# Patient Record
Sex: Male | Born: 1985 | Race: Black or African American | Hispanic: No | Marital: Single | Smoking: Current every day smoker
Health system: Southern US, Community
[De-identification: ages and names within clinical notes are randomized; demographics above are authoritative.]

## PROBLEM LIST (undated history)

## (undated) DIAGNOSIS — F209 Schizophrenia, unspecified: Secondary | ICD-10-CM

## (undated) DIAGNOSIS — R000 Tachycardia, unspecified: Secondary | ICD-10-CM

---

## 2014-07-20 DIAGNOSIS — F251 Schizoaffective disorder, depressive type: Secondary | ICD-10-CM | POA: Diagnosis not present

## 2014-07-20 DIAGNOSIS — I499 Cardiac arrhythmia, unspecified: Secondary | ICD-10-CM | POA: Diagnosis not present

## 2014-07-20 DIAGNOSIS — H5010 Unspecified exotropia: Secondary | ICD-10-CM | POA: Diagnosis not present

## 2014-07-20 DIAGNOSIS — F259 Schizoaffective disorder, unspecified: Secondary | ICD-10-CM | POA: Diagnosis not present

## 2014-07-20 DIAGNOSIS — R452 Unhappiness: Secondary | ICD-10-CM | POA: Diagnosis not present

## 2014-07-20 DIAGNOSIS — R000 Tachycardia, unspecified: Secondary | ICD-10-CM | POA: Diagnosis not present

## 2014-07-20 DIAGNOSIS — H40003 Preglaucoma, unspecified, bilateral: Secondary | ICD-10-CM | POA: Diagnosis not present

## 2015-01-16 DIAGNOSIS — H40003 Preglaucoma, unspecified, bilateral: Secondary | ICD-10-CM | POA: Diagnosis not present

## 2015-01-16 DIAGNOSIS — F259 Schizoaffective disorder, unspecified: Secondary | ICD-10-CM | POA: Diagnosis not present

## 2015-01-16 DIAGNOSIS — I499 Cardiac arrhythmia, unspecified: Secondary | ICD-10-CM | POA: Diagnosis not present

## 2015-01-16 DIAGNOSIS — R000 Tachycardia, unspecified: Secondary | ICD-10-CM | POA: Diagnosis not present

## 2015-01-16 DIAGNOSIS — F251 Schizoaffective disorder, depressive type: Secondary | ICD-10-CM | POA: Diagnosis not present

## 2015-01-16 DIAGNOSIS — H5010 Unspecified exotropia: Secondary | ICD-10-CM | POA: Diagnosis not present

## 2015-01-16 DIAGNOSIS — R452 Unhappiness: Secondary | ICD-10-CM | POA: Diagnosis not present

## 2015-01-26 DIAGNOSIS — H5010 Unspecified exotropia: Secondary | ICD-10-CM | POA: Diagnosis not present

## 2015-01-26 DIAGNOSIS — H40003 Preglaucoma, unspecified, bilateral: Secondary | ICD-10-CM | POA: Diagnosis not present

## 2015-05-30 DIAGNOSIS — H5010 Unspecified exotropia: Secondary | ICD-10-CM | POA: Diagnosis not present

## 2016-11-22 ENCOUNTER — Telehealth: Payer: Self-pay

## 2016-11-25 ENCOUNTER — Telehealth: Payer: Self-pay | Admitting: *Deleted

## 2016-11-26 ENCOUNTER — Other Ambulatory Visit: Payer: Self-pay | Admitting: Psychiatry

## 2016-11-27 ENCOUNTER — Encounter: Payer: Self-pay | Admitting: Anesthesiology

## 2016-11-27 ENCOUNTER — Encounter
Admission: RE | Admit: 2016-11-27 | Discharge: 2016-11-27 | Disposition: A | Discharge status: Home / Self Care | Payer: Medicare Other | Source: Ambulatory Visit | Attending: Psychiatry | Admitting: Psychiatry

## 2016-11-27 DIAGNOSIS — F251 Schizoaffective disorder, depressive type: Secondary | ICD-10-CM

## 2016-11-27 DIAGNOSIS — F2089 Other schizophrenia: Secondary | ICD-10-CM | POA: Diagnosis not present

## 2016-11-27 DIAGNOSIS — Z87891 Personal history of nicotine dependence: Secondary | ICD-10-CM | POA: Insufficient documentation

## 2016-11-27 DIAGNOSIS — F339 Major depressive disorder, recurrent, unspecified: Secondary | ICD-10-CM | POA: Diagnosis not present

## 2016-11-27 DIAGNOSIS — F259 Schizoaffective disorder, unspecified: Secondary | ICD-10-CM | POA: Diagnosis not present

## 2016-11-27 HISTORY — DX: Schizophrenia, unspecified: F20.9

## 2016-11-27 HISTORY — DX: Tachycardia, unspecified: R00.0

## 2016-11-27 MED ORDER — MIDAZOLAM HCL 2 MG/2ML IJ SOLN
INTRAMUSCULAR | Status: AC
  Filled 2016-11-27: qty 2

## 2016-11-27 MED ORDER — METHOHEXITAL SODIUM 100 MG/10ML IV SOSY
PREFILLED_SYRINGE | INTRAVENOUS | Status: DC | PRN
  Administered 2016-11-27: 70 mg via INTRAVENOUS

## 2016-11-27 MED ORDER — GLYCOPYRROLATE 0.2 MG/ML IJ SOLN
0.2000 mg | Freq: Once | INTRAMUSCULAR | Status: AC
  Administered 2016-11-27: 0.2 mg via INTRAVENOUS

## 2016-11-27 MED ORDER — SUCCINYLCHOLINE CHLORIDE 200 MG/10ML IV SOSY
PREFILLED_SYRINGE | INTRAVENOUS | Status: DC | PRN
  Administered 2016-11-27: 100 mg via INTRAVENOUS

## 2016-11-27 MED ORDER — SODIUM CHLORIDE 0.9 % IV SOLN
500.0000 mL | Freq: Once | INTRAVENOUS | Status: AC
  Administered 2016-11-27: 10:00:00 via INTRAVENOUS

## 2016-11-27 MED ORDER — GLYCOPYRROLATE 0.2 MG/ML IJ SOLN
INTRAMUSCULAR | Status: AC
  Filled 2016-11-27: qty 1

## 2016-11-27 MED ORDER — MIDAZOLAM HCL 2 MG/2ML IJ SOLN
INTRAMUSCULAR | Status: DC | PRN
  Administered 2016-11-27: 1 mg via INTRAVENOUS

## 2016-11-27 MED ORDER — METHOHEXITAL SODIUM 100 MG/10ML IV SOSY
PREFILLED_SYRINGE | INTRAVENOUS | Status: AC
  Filled 2016-11-27: qty 100

## 2016-11-27 NOTE — Anesthesia Post-op Follow-up Note (Cosign Needed)
Anesthesia QCDR form completed.        

## 2016-11-27 NOTE — Transfer of Care (Signed)
Immediate Anesthesia Transfer of Care Note  Patient: Jonathan Alexander  Procedure(s) Performed: ECT  Patient Location: PACU  Anesthesia Type:General  Level of Consciousness: sedated  Airway & Oxygen Therapy: Patient Spontanous Breathing and Patient connected to face mask oxygen  Post-op Assessment: Report given to RN and Post -op Vital signs reviewed and stable  Post vital signs: Reviewed and stable  Last Vitals:  Vitals:   11/27/16 0908 11/27/16 1147  BP: 125/77 124/81  Pulse: 95 95  Resp: 18 14  Temp: 36.6 C 37 C    Last Pain:  Vitals:   11/27/16 0908  TempSrc: Oral         Complications: No apparent anesthesia complications

## 2016-11-27 NOTE — H&P (Signed)
Jonathan Alexander is an 31 y.o. male.   Chief Complaint: Patient has no specific complaint. HPI: This is a 31 year old man with schizoaffective disorder who is transitioning to maintenance ECT treatment under our care since being discharged from the hospital  Past Medical History:  Diagnosis Date  . Schizophrenia (HCC)   . Tachycardia     History reviewed. No pertinent surgical history.  Family History  Problem Relation Age of Onset  . Family history unknown: Yes   Social History:  reports that he has quit smoking. His smoking use included Cigarettes. He quit after 4.00 years of use. He has never used smokeless tobacco. He reports that he does not drink alcohol or use drugs.  Allergies:  Allergies  Allergen Reactions  . Divalproex Sodium   . Shellfish-Derived Products      (Not in a hospital admission)  No results found for this or any previous visit (from the past 48 hour(s)). No results found.  Review of Systems  Constitutional: Negative.   HENT: Negative.   Eyes: Negative.   Respiratory: Negative.   Cardiovascular: Negative.   Gastrointestinal: Negative.   Musculoskeletal: Negative.   Skin: Negative.   Neurological: Negative.   Psychiatric/Behavioral: Negative for depression, hallucinations, memory loss, substance abuse and suicidal ideas. The patient is not nervous/anxious and does not have insomnia.     Blood pressure 125/77, pulse 95, temperature 97.8 F (36.6 C), temperature source Oral, resp. rate 18, weight 98.4 kg (217 lb), SpO2 100 %. Physical Exam  Nursing note and vitals reviewed. Constitutional: He appears well-developed and well-nourished.  HENT:  Head: Normocephalic and atraumatic.  Eyes: Conjunctivae are normal. Pupils are equal, round, and reactive to light.  Neck: Normal range of motion.  Cardiovascular: Regular rhythm and normal heart sounds.   Respiratory: Effort normal and breath sounds normal. No respiratory distress.  GI: Soft.   Musculoskeletal: Normal range of motion.  Neurological: He is alert.  Skin: Skin is warm and dry.  Psychiatric: His affect is blunt. His speech is delayed. He is slowed and withdrawn. Thought content is not paranoid. Cognition and memory are impaired. He expresses impulsivity. He expresses no homicidal and no suicidal ideation.     Assessment/Plan ECT treatment today using the protocol they had at [facility] with follow-up in 2 weeks  Jonathan Rasmussen, MD 11/27/2016, 11:29 AM

## 2016-11-27 NOTE — Anesthesia Preprocedure Evaluation (Signed)
Anesthesia Evaluation  Patient identified by MRN, date of birth, ID band Patient awake    Reviewed: Allergy & Precautions, H&P , NPO status , Patient's Chart, lab work & pertinent test results, reviewed documented beta blocker date and time   History of Anesthesia Complications Negative for: history of anesthetic complications  Airway Mallampati: II  TM Distance: >3 FB Neck ROM: full    Dental  (+) Poor Dentition, Chipped, Dental Advidsory Given   Pulmonary neg pulmonary ROS, former smoker,           Cardiovascular Exercise Tolerance: Good (-) hypertension(-) angina(-) CAD, (-) Past MI, (-) Cardiac Stents and (-) CABG + dysrhythmias (-) Valvular Problems/Murmurs     Neuro/Psych PSYCHIATRIC DISORDERS (Schizophrenia, depression) negative neurological ROS     GI/Hepatic negative GI ROS, Neg liver ROS,   Endo/Other  negative endocrine ROS  Renal/GU negative Renal ROS  negative genitourinary   Musculoskeletal   Abdominal   Peds  Hematology negative hematology ROS (+)   Anesthesia Other Findings Past Medical History: No date: Tachycardia   Reproductive/Obstetrics negative OB ROS                             Anesthesia Physical Anesthesia Plan  ASA: III  Anesthesia Plan: General   Post-op Pain Management:    Induction:   Airway Management Planned:   Additional Equipment:   Intra-op Plan:   Post-operative Plan:   Informed Consent: I have reviewed the patients History and Physical, chart, labs and discussed the procedure including the risks, benefits and alternatives for the proposed anesthesia with the patient or authorized representative who has indicated his/her understanding and acceptance.   Dental Advisory Given  Plan Discussed with: Anesthesiologist, CRNA and Surgeon  Anesthesia Plan Comments:         Anesthesia Quick Evaluation

## 2016-11-27 NOTE — Anesthesia Procedure Notes (Addendum)
Date/Time: 11/27/2016 11:38 AM Performed by: Lily Kocher Pre-anesthesia Checklist: Patient identified, Emergency Drugs available, Suction available and Patient being monitored Patient Re-evaluated:Patient Re-evaluated prior to inductionOxygen Delivery Method: Circle system utilized Preoxygenation: Pre-oxygenation with 100% oxygen Intubation Type: IV induction Ventilation: Mask ventilation without difficulty and Mask ventilation throughout procedure Airway Equipment and Method: Bite block Placement Confirmation: positive ETCO2 Dental Injury: Teeth and Oropharynx as per pre-operative assessment

## 2016-11-27 NOTE — Anesthesia Postprocedure Evaluation (Signed)
Anesthesia Post Note  Patient: Estate agent  Procedure(s) Performed: * No procedures listed *  Patient location during evaluation: PACU Anesthesia Type: General Level of consciousness: awake and alert Pain management: pain level controlled Vital Signs Assessment: post-procedure vital signs reviewed and stable Respiratory status: spontaneous breathing, nonlabored ventilation, respiratory function stable and patient connected to nasal cannula oxygen Cardiovascular status: blood pressure returned to baseline and stable Postop Assessment: no signs of nausea or vomiting Anesthetic complications: no     Last Vitals:  Vitals:   11/27/16 1237 11/27/16 1247  BP: 125/77 (!) 139/96  Pulse: 98   Resp: 11 (!) 9  Temp:      Last Pain:  Vitals:   11/27/16 1227  TempSrc:   PainSc: Asleep                 Lenard Simmer

## 2016-11-27 NOTE — Procedures (Signed)
ECT SERVICES Physician's Interval Evaluation & Treatment Note  Patient Identification: Jonathan Alexander MRN:  [id number] Date of Evaluation:  11/27/2016 TX #: 1  MADRS: 21  MMSE: 25  P.E. Findings:  Heart and lungs are normal. Vitals unremarkable. Patient does not have any significant physical exam findings  Psychiatric Interval Note:  Mood is stated as okay. Patient is flat slow withdrawn minimal speech.. Denies acute psychotic symptoms  Subjective:  Patient is a 31 y.o. male seen for evaluation for Electroconvulsive Therapy. Patient gets maintenance ECT every other week and we will try to maintain that schedule  Treatment Summary:     Right Unilateral              Bilateral   % Energy : 1.0 ms 100%   Impedance: 1060 ohms  Seizure Energy Index: 17,460 V squared  Postictal Suppression Index: 90%  Seizure Concordance Index: 97%  Medications  Pre Shock: Robinul 0.2 mg Brevital 70 mg succinylcholine 100 mg  Post Shock: Versed 1 mg  Seizure Duration: 36 seconds by EMG, 37 seconds by EEG   Comments: Follow-up 2 weeks   Lungs:    Clear to auscultation                Other:   Heart:      Regular rhythm              irregular rhythm      Previous H&P reviewed, patient examined and there are NO CHANGES                   Previous H&P reviewed, patient examined and there are changes noted.   Jonathan Rasmussen, MD 4/25/201811:31 AM

## 2016-11-27 NOTE — Discharge Instructions (Signed)
1)  The drugs that you have been given will stay in your system until tomorrow so for the       next 24 hours you should not:  A. Drive an automobile  B. Make any legal decisions  C. Drink any alcoholic beverages  2)  You may resume your regular meals upon return home.  3)  A responsible adult must take you home.  Someone should stay with you for a few          hours, then be available by phone for the remainder of the treatment day.  4)  You May experience any of the following symptoms:  Headache, Nausea and a dry mouth (due to the medications you were given),  temporary memory loss and some confusion, or sore muscles (a warm bath  should help this).  If you you experience any of these symptoms let us know on                your return visit.  5)  Report any of the following: any acute discomfort, severe headache, or temperature        greater than 100.5 F.   Also report any unusual redness, swelling, drainage, or pain         at your IV site.    You may report Symptoms to:  ECT PROGRAM- [place] [facility]          Phone: [phone], ECT Department           or Dr. Shary Key office [phone]  6)  Your next ECT Treatment is Day Wednesday  Date Dec 11, 2016  We will call 2 days prior to your scheduled appointment for arrival times.  7)  Nothing to eat or drink after midnight the night before your procedure.  8)  Take Metoprolol    With a sip of water the morning of your procedure.  9)  Other Instructions: Call [phone] to cancel the morning of your procedure due         to illness or emergency.  10) We will call within 72 hours to assess how you are feeling.

## 2016-11-29 ENCOUNTER — Telehealth: Payer: Self-pay | Admitting: *Deleted

## 2016-12-03 ENCOUNTER — Institutional Professional Consult (permissible substitution): Payer: Self-pay | Admitting: Psychiatry

## 2016-12-09 ENCOUNTER — Telehealth: Payer: Self-pay | Admitting: *Deleted

## 2016-12-10 ENCOUNTER — Other Ambulatory Visit: Payer: Self-pay | Admitting: Psychiatry

## 2016-12-11 ENCOUNTER — Encounter: Payer: Self-pay | Admitting: Certified Registered Nurse Anesthetist

## 2016-12-11 ENCOUNTER — Ambulatory Visit
Admission: RE | Admit: 2016-12-11 | Discharge: 2016-12-11 | Disposition: A | Discharge status: Home / Self Care | Payer: Medicare Other | Source: Ambulatory Visit | Attending: Psychiatry | Admitting: Psychiatry

## 2016-12-11 DIAGNOSIS — F251 Schizoaffective disorder, depressive type: Secondary | ICD-10-CM | POA: Diagnosis not present

## 2016-12-11 DIAGNOSIS — F419 Anxiety disorder, unspecified: Secondary | ICD-10-CM | POA: Insufficient documentation

## 2016-12-11 DIAGNOSIS — Z87891 Personal history of nicotine dependence: Secondary | ICD-10-CM | POA: Insufficient documentation

## 2016-12-11 DIAGNOSIS — F209 Schizophrenia, unspecified: Secondary | ICD-10-CM | POA: Diagnosis not present

## 2016-12-11 DIAGNOSIS — Z79899 Other long term (current) drug therapy: Secondary | ICD-10-CM | POA: Diagnosis not present

## 2016-12-11 DIAGNOSIS — F339 Major depressive disorder, recurrent, unspecified: Secondary | ICD-10-CM | POA: Diagnosis not present

## 2016-12-11 DIAGNOSIS — F259 Schizoaffective disorder, unspecified: Secondary | ICD-10-CM | POA: Insufficient documentation

## 2016-12-11 MED ORDER — MIDAZOLAM HCL 2 MG/2ML IJ SOLN
INTRAMUSCULAR | Status: AC
  Filled 2016-12-11: qty 2

## 2016-12-11 MED ORDER — GLYCOPYRROLATE 0.2 MG/ML IJ SOLN
INTRAMUSCULAR | Status: AC
  Administered 2016-12-11: 0.2 mg via INTRAVENOUS
  Filled 2016-12-11: qty 1

## 2016-12-11 MED ORDER — SUCCINYLCHOLINE CHLORIDE 20 MG/ML IJ SOLN
INTRAMUSCULAR | Status: AC
  Filled 2016-12-11: qty 1

## 2016-12-11 MED ORDER — MIDAZOLAM HCL 2 MG/2ML IJ SOLN
INTRAMUSCULAR | Status: DC | PRN
Start: 2016-12-11 — End: 2016-12-11
  Administered 2016-12-11: 1 mg via INTRAVENOUS

## 2016-12-11 MED ORDER — METHOHEXITAL SODIUM 100 MG/10ML IV SOSY
PREFILLED_SYRINGE | INTRAVENOUS | Status: DC | PRN
  Administered 2016-12-11: 70 mg via INTRAVENOUS

## 2016-12-11 MED ORDER — SODIUM CHLORIDE 0.9 % IV SOLN
INTRAVENOUS | Status: DC | PRN
  Administered 2016-12-11: 11:00:00 via INTRAVENOUS

## 2016-12-11 MED ORDER — MIDAZOLAM HCL 2 MG/2ML IJ SOLN: 1.0000 mg | Freq: Once | INTRAMUSCULAR | Status: DC

## 2016-12-11 MED ORDER — SUCCINYLCHOLINE CHLORIDE 20 MG/ML IJ SOLN
INTRAMUSCULAR | Status: DC | PRN
  Administered 2016-12-11: 100 mg via INTRAVENOUS

## 2016-12-11 MED ORDER — SODIUM CHLORIDE 0.9 % IV SOLN
500.0000 mL | Freq: Once | INTRAVENOUS | Status: AC
  Administered 2016-12-11: 10:00:00 via INTRAVENOUS

## 2016-12-11 MED ORDER — GLYCOPYRROLATE 0.2 MG/ML IJ SOLN
0.2000 mg | Freq: Once | INTRAMUSCULAR | Status: AC
Start: 2016-12-11 — End: 2016-12-11
  Administered 2016-12-11: 0.2 mg via INTRAVENOUS

## 2016-12-11 NOTE — Transfer of Care (Signed)
Immediate Anesthesia Transfer of Care Note  Patient: Jonathan Alexander  Procedure(s) Performed: * No procedures listed *  Patient Location: PACU  Anesthesia Type:General  Level of Consciousness: drowsy  Airway & Oxygen Therapy: Patient Spontanous Breathing and Patient connected to face mask oxygen  Post-op Assessment: Report given to RN  Post vital signs: Reviewed and stable  Last Vitals:  Vitals:   12/11/16 0938 12/11/16 1058  BP: (!) 142/91 126/87  Pulse: 89 (!) 103  Resp: 12 (!) 23  Temp: 36.4 C 37.2 C    Last Pain:  Vitals:   12/11/16 1058  PainSc: Asleep         Complications: No apparent anesthesia complications

## 2016-12-11 NOTE — Anesthesia Postprocedure Evaluation (Signed)
Anesthesia Post Note  Patient: Estate agentDemetrio Werst  Procedure(s) Performed: * No procedures listed *  Patient location during evaluation: PACU Anesthesia Type: General Level of consciousness: awake and alert Pain management: pain level controlled Vital Signs Assessment: post-procedure vital signs reviewed and stable Respiratory status: spontaneous breathing, nonlabored ventilation, respiratory function stable and patient connected to nasal cannula oxygen Cardiovascular status: blood pressure returned to baseline and stable Postop Assessment: no signs of nausea or vomiting Anesthetic complications: no     Last Vitals:  Vitals:   12/11/16 1218 12/11/16 1244  BP: 122/90 (!) (P) 109/98  Pulse: 86 (P) 89  Resp: 15 (P) 18  Temp:      Last Pain:  Vitals:   12/11/16 1148  PainSc: Asleep                 Lenard SimmerAndrew Clayton Bosserman

## 2016-12-11 NOTE — H&P (Signed)
Jonathan Alexander is an 31 y.o. male.   Chief Complaint: No specific new complaint. Chronic anxiety. Patient is pretty withdrawn which seems to be baseline HPI: History of schizoaffective disorder stable on combination medication and maintenance ECT  Past Medical History:  Diagnosis Date  . Schizophrenia (HCC)   . Tachycardia     History reviewed. No pertinent surgical history.  Family History  Problem Relation Age of Onset  . Family history unknown: Yes   Social History:  reports that he has quit smoking. His smoking use included Cigarettes. He quit after 4.00 years of use. He has never used smokeless tobacco. He reports that he does not drink alcohol or use drugs.  Allergies:  Allergies  Allergen Reactions  . Divalproex Sodium   . Shellfish-Derived Products      (Not in a hospital admission)  No results found for this or any previous visit (from the past 48 hour(s)). No results found.  Review of Systems  Constitutional: Negative.   HENT: Negative.   Eyes: Negative.   Respiratory: Negative.   Cardiovascular: Negative.   Gastrointestinal: Negative.   Musculoskeletal: Negative.   Skin: Negative.   Neurological: Negative.   Psychiatric/Behavioral: Negative.     Blood pressure (!) 142/91, pulse 89, temperature 97.6 F (36.4 C), resp. rate 12, height 5\' 7"  (1.702 m), weight 100.2 kg (221 lb), SpO2 98 %. Physical Exam  Nursing note and vitals reviewed. Constitutional: He appears well-developed and well-nourished.  HENT:  Head: Normocephalic and atraumatic.  Eyes: Conjunctivae are normal. Pupils are equal, round, and reactive to light.  Neck: Normal range of motion.  Cardiovascular: Normal rate, regular rhythm and normal heart sounds.   Respiratory: Effort normal and breath sounds normal. No respiratory distress.  GI: Soft.  Musculoskeletal: Normal range of motion.  Neurological: He is alert.  Skin: Skin is warm and dry.  Psychiatric: Judgment normal. His affect is  blunt. His speech is delayed. He is slowed and withdrawn. Cognition and memory are normal. He expresses no homicidal and no suicidal ideation.     Assessment/Plan Follow-up 2 weeks per his previous standard protocol.  [name], MD 12/11/2016, 10:38 AM

## 2016-12-11 NOTE — Anesthesia Preprocedure Evaluation (Signed)
Anesthesia Evaluation  Patient identified by MRN, date of birth, ID band Patient awake    Reviewed: Allergy & Precautions, H&P , NPO status , Patient's Chart, lab work & pertinent test results, reviewed documented beta blocker date and time   History of Anesthesia Complications Negative for: history of anesthetic complications  Airway Mallampati: II  TM Distance: >3 FB Neck ROM: full    Dental  (+) Poor Dentition, Chipped, Dental Advidsory Given   Pulmonary neg pulmonary ROS, former smoker,           Cardiovascular Exercise Tolerance: Good (-) hypertension(-) angina(-) CAD, (-) Past MI, (-) Cardiac Stents and (-) CABG + dysrhythmias (-) Valvular Problems/Murmurs     Neuro/Psych PSYCHIATRIC DISORDERS (Schizophrenia, depression) negative neurological ROS     GI/Hepatic negative GI ROS, Neg liver ROS,   Endo/Other  negative endocrine ROS  Renal/GU negative Renal ROS  negative genitourinary   Musculoskeletal   Abdominal   Peds  Hematology negative hematology ROS (+)   Anesthesia Other Findings Past Medical History: No date: Tachycardia   Reproductive/Obstetrics negative OB ROS                             Anesthesia Physical Anesthesia Plan  ASA: III  Anesthesia Plan: General   Post-op Pain Management:    Induction:   Airway Management Planned:   Additional Equipment:   Intra-op Plan:   Post-operative Plan:   Informed Consent: I have reviewed the patients History and Physical, chart, labs and discussed the procedure including the risks, benefits and alternatives for the proposed anesthesia with the patient or authorized representative who has indicated his/her understanding and acceptance.   Dental Advisory Given  Plan Discussed with: Anesthesiologist, CRNA and Surgeon  Anesthesia Plan Comments:         Anesthesia Quick Evaluation  

## 2016-12-11 NOTE — Anesthesia Post-op Follow-up Note (Cosign Needed)
Anesthesia QCDR form completed.        

## 2016-12-11 NOTE — Procedures (Signed)
ECT SERVICES Physician's Interval Evaluation & Treatment Note  Patient Identification: Jonathan Alexander MRN:  [id number] Date of Evaluation:  12/11/2016 TX #: 2  MADRS:   MMSE:   P.E. Findings:  No change to physical exam vital stable heart and lungs normal.  Psychiatric Interval Note:  Patient is stable blunted affect  Subjective:  Patient is a 31 y.o. male seen for evaluation for Electroconvulsive Therapy. No specific complaints  Treatment Summary:   []   Right Unilateral             [x]  Bilateral   % Energy : 1.0 ms 100%   Impedance: 790 ohms  Seizure Energy Index: 15,804 V squared  Postictal Suppression Index: 18%  Seizure Concordance Index: 96%  Medications  Pre Shock: Robinul 0.2 mg Brevital 70 mg succinylcholine 100 mg  Post Shock: Versed 1 mg  Seizure Duration: 32 seconds by EMG 36 seconds by EEG   Comments: Follow-up 2 weeks   Lungs:  [x]   Clear to auscultation               []  Other:   Heart:    [x]   Regular rhythm             []  irregular rhythm    [x]   Previous H&P reviewed, patient examined and there are NO CHANGES                 []   Previous H&P reviewed, patient examined and there are changes noted.   Mordecai RasmussenJohn Alnisa Hasley, MD 5/9/201810:40 AM

## 2016-12-11 NOTE — Discharge Instructions (Signed)
1)  The drugs that you have been given will stay in your system until tomorrow so for the       next 24 hours you should not:  A. Drive an automobile  B. Make any legal decisions  C. Drink any alcoholic beverages  2)  You may resume your regular meals upon return home.  3)  A responsible adult must take you home.  Someone should stay with you for a few          hours, then be available by phone for the remainder of the treatment day.  4)  You May experience any of the following symptoms:  Headache, Nausea and a dry mouth (due to the medications you were given),  temporary memory loss and some confusion, or sore muscles (a warm bath  should help this).  If you you experience any of these symptoms let us know on                your return visit.  5)  Report any of the following: any acute discomfort, severe headache, or temperature        greater than 100.5 F.   Also report any unusual redness, swelling, drainage, or pain         at your IV site.    You may report Symptoms to:  ECT PROGRAM- [place] [facility]          Phone: [phone], ECT Department           or Dr. Shary Keylapac's office [phone]  6)  Your next ECT Treatment is Day Wednesday  Date Dec 25, 2016   We will call 2 days prior to your scheduled appointment for arrival times.  7)  Nothing to eat or drink after midnight the night before your procedure.  8)  Take metoprolol, thiothixene, clozapine, zyprexa, effexor     With a sip of water the morning of your procedure.  9)  Other Instructions: Call [phone] to cancel the morning of your procedure due         to illness or emergency.  10) We will call within 72 hours to assess how you are feeling.

## 2016-12-11 NOTE — Anesthesia Procedure Notes (Signed)
Date/Time: 12/11/2016 10:40 AM Performed by: Marlana SalvageJESSUP, Prathik Aman Pre-anesthesia Checklist: Patient identified, Emergency Drugs available, Suction available, Patient being monitored and Timeout performed Patient Re-evaluated:Patient Re-evaluated prior to inductionOxygen Delivery Method: Circle system utilized Preoxygenation: Pre-oxygenation with 100% oxygen Intubation Type: IV induction Ventilation: Mask ventilation without difficulty and Mask ventilation throughout procedure

## 2016-12-23 ENCOUNTER — Telehealth: Payer: Self-pay | Admitting: *Deleted

## 2016-12-24 ENCOUNTER — Other Ambulatory Visit: Payer: Self-pay | Admitting: Psychiatry

## 2016-12-24 DIAGNOSIS — Z79899 Other long term (current) drug therapy: Secondary | ICD-10-CM | POA: Diagnosis not present

## 2016-12-25 ENCOUNTER — Ambulatory Visit
Admission: RE | Admit: 2016-12-25 | Discharge: 2016-12-25 | Disposition: A | Discharge status: Home / Self Care | Payer: Medicare Other | Source: Ambulatory Visit | Attending: Psychiatry | Admitting: Psychiatry

## 2016-12-25 ENCOUNTER — Encounter: Payer: Self-pay | Admitting: Anesthesiology

## 2016-12-25 DIAGNOSIS — F2089 Other schizophrenia: Secondary | ICD-10-CM | POA: Diagnosis not present

## 2016-12-25 DIAGNOSIS — F209 Schizophrenia, unspecified: Secondary | ICD-10-CM | POA: Diagnosis not present

## 2016-12-25 DIAGNOSIS — F251 Schizoaffective disorder, depressive type: Secondary | ICD-10-CM | POA: Diagnosis not present

## 2016-12-25 DIAGNOSIS — Z87891 Personal history of nicotine dependence: Secondary | ICD-10-CM | POA: Insufficient documentation

## 2016-12-25 DIAGNOSIS — F339 Major depressive disorder, recurrent, unspecified: Secondary | ICD-10-CM | POA: Diagnosis not present

## 2016-12-25 MED ORDER — METHOHEXITAL SODIUM 0.5 G IJ SOLR
INTRAMUSCULAR | Status: AC
  Filled 2016-12-25: qty 500

## 2016-12-25 MED ORDER — METHOHEXITAL SODIUM 100 MG/10ML IV SOSY
PREFILLED_SYRINGE | INTRAVENOUS | Status: DC | PRN
  Administered 2016-12-25: 70 mg via INTRAVENOUS

## 2016-12-25 MED ORDER — MIDAZOLAM HCL 2 MG/2ML IJ SOLN
INTRAMUSCULAR | Status: AC
  Filled 2016-12-25: qty 2

## 2016-12-25 MED ORDER — MIDAZOLAM HCL 2 MG/2ML IJ SOLN: 2.0000 mg | Freq: Once | INTRAMUSCULAR | Status: DC

## 2016-12-25 MED ORDER — MIDAZOLAM HCL 2 MG/2ML IJ SOLN
INTRAMUSCULAR | Status: DC | PRN
  Administered 2016-12-25: 1 mg via INTRAVENOUS

## 2016-12-25 MED ORDER — MIDAZOLAM HCL 2 MG/2ML IJ SOLN: 1.0000 mg | Freq: Once | INTRAMUSCULAR | Status: DC

## 2016-12-25 MED ORDER — SODIUM CHLORIDE 0.9 % IV SOLN
INTRAVENOUS | Status: DC | PRN
  Administered 2016-12-25: 10:00:00 via INTRAVENOUS

## 2016-12-25 MED ORDER — GLYCOPYRROLATE 0.2 MG/ML IJ SOLN
INTRAMUSCULAR | Status: AC
  Administered 2016-12-25: 0.2 mg via INTRAVENOUS
  Filled 2016-12-25: qty 1

## 2016-12-25 MED ORDER — SUCCINYLCHOLINE CHLORIDE 200 MG/10ML IV SOSY
PREFILLED_SYRINGE | INTRAVENOUS | Status: DC | PRN
  Administered 2016-12-25: 100 mg via INTRAVENOUS

## 2016-12-25 MED ORDER — SUCCINYLCHOLINE CHLORIDE 20 MG/ML IJ SOLN
INTRAMUSCULAR | Status: AC
  Filled 2016-12-25: qty 1

## 2016-12-25 MED ORDER — SODIUM CHLORIDE 0.9 % IV SOLN
500.0000 mL | Freq: Once | INTRAVENOUS | Status: AC
  Administered 2016-12-25: 1000 mL via INTRAVENOUS

## 2016-12-25 MED ORDER — GLYCOPYRROLATE 0.2 MG/ML IJ SOLN
0.2000 mg | Freq: Once | INTRAMUSCULAR | Status: AC
  Administered 2016-12-25: 0.2 mg via INTRAVENOUS

## 2016-12-25 NOTE — Discharge Instructions (Signed)
1)  The drugs that you have been given will stay in your system until tomorrow so for the       next 24 hours you should not:  A. Drive an automobile  B. Make any legal decisions  C. Drink any alcoholic beverages  2)  You may resume your regular meals upon return home.  3)  A responsible adult must take you home.  Someone should stay with you for a few          hours, then be available by phone for the remainder of the treatment day.  4)  You May experience any of the following symptoms:  Headache, Nausea and a dry mouth (due to the medications you were given),  temporary memory loss and some confusion, or sore muscles (a warm bath  should help this).  If you you experience any of these symptoms let us know on                your return visit.  5)  Report any of the following: any acute discomfort, severe headache, or temperature        greater than 100.5 F.   Also report any unusual redness, swelling, drainage, or pain         at your IV site.    You may report Symptoms to:  [facility] [place] [facility]          Phone: [phone], ECT Department           or Dr. Shary Keylapac's office [phone]  6)  Your next ECT Treatment is Wednesday June 6   We will call 2 days prior to your scheduled appointment for arrival times.  7)  Nothing to eat or drink after midnight the night before your procedure.  8)  Take  medication   With a sip of water the morning of your procedure.  9)  Other Instructions: Call [phone] to cancel the morning of your procedure due         to illness or emergency.  10) We will call within 72 hours to assess how you are feeling.

## 2016-12-25 NOTE — Procedures (Signed)
ECT SERVICES Physician's Interval Evaluation & Treatment Note  Patient Identification: Jonathan FriendlyDemetrio Alexander MRN:  [id number] Date of Evaluation:  12/25/2016 TX #: 3  MADRS:   MMSE:   P.E. Findings:  Vitals unremarkable heart and lungs normal no change to physical.  Psychiatric Interval Note:  No new complaints affect about the same mood about the same no changes  Subjective:  Patient is a 31 y.o. male seen for evaluation for Electroconvulsive Therapy. No specific complaints  Treatment Summary:   []   Right Unilateral             [x]  Bilateral   % Energy : This 1.0 ms 100%   Impedance: 880 ohms  Seizure Energy Index: 21,352 V squared  Postictal Suppression Index: 83%  Seizure Concordance Index: 91%  Medications  Pre Shock: Robinul 0.2 mg Brevital 70 mg succinylcholine 100 mg  Post Shock: Versed 1 mg  Seizure Duration: 33 seconds EMG, 40 seconds EEG   Comments: Follow-up 2 weeks   Lungs:  [x]   Clear to auscultation               []  Other:   Heart:    [x]   Regular rhythm             []  irregular rhythm    [x]   Previous H&P reviewed, patient examined and there are NO CHANGES                 []   Previous H&P reviewed, patient examined and there are changes noted.   [name], MD 5/23/201810:19 AM

## 2016-12-25 NOTE — H&P (Signed)
Jonathan Alexander is an 31 y.o. male.   Chief Complaint: No specific new complaint HPI: Schizoaffective disorder with good response to maintenance ECT continuing with maintenance treatment  Past Medical History:  Diagnosis Date  . Schizophrenia (HCC)   . Tachycardia     History reviewed. No pertinent surgical history.  Family History  Problem Relation Age of Onset  . Family history unknown: Yes   Social History:  reports that he has quit smoking. His smoking use included Cigarettes. He quit after 4.00 years of use. He has never used smokeless tobacco. He reports that he does not drink alcohol or use drugs.  Allergies:  Allergies  Allergen Reactions  . Divalproex Sodium   . Shellfish-Derived Products      (Not in a hospital admission)  No results found for this or any previous visit (from the past 48 hour(s)). No results found.  Review of Systems  Constitutional: Negative.   HENT: Negative.   Eyes: Negative.   Respiratory: Negative.   Cardiovascular: Negative.   Gastrointestinal: Negative.   Musculoskeletal: Negative.   Skin: Negative.   Neurological: Negative.   Psychiatric/Behavioral: Negative.     Blood pressure (!) 139/94, pulse 89, temperature 98.5 F (36.9 C), temperature source Oral, resp. rate (!) 1, height 5\' 7"  (1.702 m), weight 100.2 kg (221 lb), SpO2 100 %. Physical Exam  Nursing note and vitals reviewed. Constitutional: He appears well-developed and well-nourished.  HENT:  Head: Normocephalic and atraumatic.  Eyes: Conjunctivae are normal. Pupils are equal, round, and reactive to light.  Neck: Normal range of motion.  Cardiovascular: Regular rhythm and normal heart sounds.   Respiratory: Effort normal and breath sounds normal. No respiratory distress.  GI: Soft.  Musculoskeletal: Normal range of motion.  Neurological: He is alert.  Skin: Skin is warm and dry.  Psychiatric: Judgment normal. His affect is blunt. His speech is delayed. He is slowed.  Thought content is not paranoid. Cognition and memory are normal. He expresses no homicidal and no suicidal ideation.     Assessment/Plan Treatment today follow-up 2 weeks as per the usual schedule  Jonathan RasmussenJohn Lyna Laningham, MD 12/25/2016, 10:17 AM

## 2016-12-25 NOTE — Anesthesia Procedure Notes (Signed)
Date/Time: 12/25/2016 10:25 AM Performed by: Lily KocherPERALTA, Devon Pretty Pre-anesthesia Checklist: Patient identified, Emergency Drugs available, Suction available and Patient being monitored Patient Re-evaluated:Patient Re-evaluated prior to inductionOxygen Delivery Method: Circle system utilized Preoxygenation: Pre-oxygenation with 100% oxygen Intubation Type: IV induction Ventilation: Mask ventilation without difficulty and Mask ventilation throughout procedure Airway Equipment and Method: Bite block Placement Confirmation: positive ETCO2 Dental Injury: Teeth and Oropharynx as per pre-operative assessment

## 2016-12-25 NOTE — Anesthesia Post-op Follow-up Note (Cosign Needed)
Anesthesia QCDR form completed.        

## 2016-12-25 NOTE — Progress Notes (Signed)
Still sleepy but wakes when calling name.  Will not answer any questions asked.  Will advance to phase II recovery. Position in Reclining chair.  Report called to Violet BaldyKaren Barrett RN.

## 2016-12-25 NOTE — Anesthesia Preprocedure Evaluation (Signed)
Anesthesia Evaluation  Patient identified by MRN, date of birth, ID band Patient awake    Reviewed: Allergy & Precautions, H&P , NPO status , Patient's Chart, lab work & pertinent test results, reviewed documented beta blocker date and time   History of Anesthesia Complications Negative for: history of anesthetic complications  Airway Mallampati: II  TM Distance: >3 FB Neck ROM: full    Dental  (+) Poor Dentition, Chipped, Dental Advidsory Given   Pulmonary neg pulmonary ROS, former smoker,           Cardiovascular Exercise Tolerance: Good (-) hypertension(-) angina(-) CAD, (-) Past MI, (-) Cardiac Stents and (-) CABG + dysrhythmias (-) Valvular Problems/Murmurs     Neuro/Psych PSYCHIATRIC DISORDERS (Schizophrenia, depression) negative neurological ROS     GI/Hepatic negative GI ROS, Neg liver ROS,   Endo/Other  negative endocrine ROS  Renal/GU negative Renal ROS  negative genitourinary   Musculoskeletal   Abdominal   Peds  Hematology negative hematology ROS (+)   Anesthesia Other Findings Past Medical History: No date: Tachycardia   Reproductive/Obstetrics negative OB ROS                             Anesthesia Physical  Anesthesia Plan  ASA: III  Anesthesia Plan: General   Post-op Pain Management:    Induction:   Airway Management Planned:   Additional Equipment:   Intra-op Plan:   Post-operative Plan:   Informed Consent: I have reviewed the patients History and Physical, chart, labs and discussed the procedure including the risks, benefits and alternatives for the proposed anesthesia with the patient or authorized representative who has indicated his/her understanding and acceptance.   Dental Advisory Given  Plan Discussed with: Anesthesiologist, CRNA and Surgeon  Anesthesia Plan Comments:         Anesthesia Quick Evaluation

## 2016-12-25 NOTE — Transfer of Care (Signed)
Immediate Anesthesia Transfer of Care Note  Patient: Jonathan Alexander  Procedure(s) Performed: ECT  Patient Location: PACU  Anesthesia Type:General  Level of Consciousness: sedated  Airway & Oxygen Therapy: Patient Spontanous Breathing  Post-op Assessment: Report given to RN and Post -op Vital signs reviewed and stable  Post vital signs: Reviewed and stable  Last Vitals:  Vitals:   12/25/16 0825 12/25/16 1030  BP: (!) 139/94 (!) 145/57  Pulse: 89 100  Resp: (!) 1 20  Temp: 36.9 C 36.6 C    Last Pain:  Vitals:   12/25/16 1030  TempSrc:   PainSc: Asleep         Complications: No apparent anesthesia complications

## 2016-12-26 NOTE — Anesthesia Postprocedure Evaluation (Signed)
Anesthesia Post Note  Patient: Jonathan Alexander  Procedure(s) Performed: * No procedures listed *  Patient location during evaluation: PACU Anesthesia Type: General Level of consciousness: awake and alert Pain management: pain level controlled Vital Signs Assessment: post-procedure vital signs reviewed and stable Respiratory status: spontaneous breathing, nonlabored ventilation, respiratory function stable and patient connected to nasal cannula oxygen Cardiovascular status: blood pressure returned to baseline and stable Postop Assessment: no signs of nausea or vomiting Anesthetic complications: no     Last Vitals:  Vitals:   12/25/16 1100 12/25/16 1140  BP: 137/87 137/82  Pulse: 97 98  Resp: 16 18  Temp: 36.5 C 36.6 C    Last Pain:  Vitals:   12/25/16 1140  TempSrc: Oral  PainSc:                  Lenard SimmerAndrew Araiya Tilmon

## 2017-01-06 ENCOUNTER — Telehealth: Payer: Self-pay | Admitting: *Deleted

## 2017-01-07 ENCOUNTER — Other Ambulatory Visit: Payer: Self-pay | Admitting: Psychiatry

## 2017-01-08 ENCOUNTER — Encounter: Payer: Self-pay | Admitting: Anesthesiology

## 2017-01-08 ENCOUNTER — Encounter
Admission: RE | Admit: 2017-01-08 | Discharge: 2017-01-08 | Disposition: A | Discharge status: Home / Self Care | Payer: Medicare Other | Source: Ambulatory Visit | Attending: Psychiatry | Admitting: Psychiatry

## 2017-01-08 DIAGNOSIS — Z87891 Personal history of nicotine dependence: Secondary | ICD-10-CM | POA: Diagnosis not present

## 2017-01-08 DIAGNOSIS — F259 Schizoaffective disorder, unspecified: Secondary | ICD-10-CM | POA: Insufficient documentation

## 2017-01-08 DIAGNOSIS — F251 Schizoaffective disorder, depressive type: Secondary | ICD-10-CM | POA: Diagnosis not present

## 2017-01-08 DIAGNOSIS — F2089 Other schizophrenia: Secondary | ICD-10-CM | POA: Diagnosis not present

## 2017-01-08 MED ORDER — MIDAZOLAM HCL 2 MG/2ML IJ SOLN
INTRAMUSCULAR | Status: AC
  Filled 2017-01-08: qty 2

## 2017-01-08 MED ORDER — SUCCINYLCHOLINE CHLORIDE 200 MG/10ML IV SOSY
PREFILLED_SYRINGE | INTRAVENOUS | Status: DC | PRN
  Administered 2017-01-08: 100 mg via INTRAVENOUS

## 2017-01-08 MED ORDER — MIDAZOLAM HCL 2 MG/2ML IJ SOLN
1.0000 mg | Freq: Once | INTRAMUSCULAR | Status: AC
  Administered 2017-01-08: 1 mg via INTRAVENOUS

## 2017-01-08 MED ORDER — GLYCOPYRROLATE 0.2 MG/ML IJ SOLN
INTRAMUSCULAR | Status: AC
  Administered 2017-01-08: 0.2 mg via INTRAVENOUS
  Filled 2017-01-08: qty 1

## 2017-01-08 MED ORDER — GLYCOPYRROLATE 0.2 MG/ML IJ SOLN
0.2000 mg | Freq: Once | INTRAMUSCULAR | Status: AC
  Administered 2017-01-08: 0.2 mg via INTRAVENOUS

## 2017-01-08 MED ORDER — METHOHEXITAL SODIUM 100 MG/10ML IV SOSY
PREFILLED_SYRINGE | INTRAVENOUS | Status: DC | PRN
  Administered 2017-01-08: 70 mg via INTRAVENOUS

## 2017-01-08 MED ORDER — SODIUM CHLORIDE 0.9 % IV SOLN
INTRAVENOUS | Status: DC | PRN
  Administered 2017-01-08: 10:00:00 via INTRAVENOUS

## 2017-01-08 MED ORDER — SUCCINYLCHOLINE CHLORIDE 20 MG/ML IJ SOLN
INTRAMUSCULAR | Status: AC
  Filled 2017-01-08: qty 1

## 2017-01-08 MED ORDER — SODIUM CHLORIDE 0.9 % IV SOLN
500.0000 mL | Freq: Once | INTRAVENOUS | Status: AC
  Administered 2017-01-08: 09:00:00 via INTRAVENOUS

## 2017-01-08 MED ORDER — METHOHEXITAL SODIUM 0.5 G IJ SOLR
INTRAMUSCULAR | Status: AC
  Filled 2017-01-08: qty 500

## 2017-01-08 NOTE — Discharge Instructions (Signed)
1)  The drugs that you have been given will stay in your system until tomorrow so for the       next 24 hours you should not:  A. Drive an automobile  B. Make any legal decisions  C. Drink any alcoholic beverages  2)  You may resume your regular meals upon return home.  3)  A responsible adult must take you home.  Someone should stay with you for a few          hours, then be available by phone for the remainder of the treatment day.  4)  You May experience any of the following symptoms:  Headache, Nausea and a dry mouth (due to the medications you were given),  temporary memory loss and some confusion, or sore muscles (a warm bath  should help this).  If you you experience any of these symptoms let us know on                your return visit.  5)  Report any of the following: any acute discomfort, severe headache, or temperature        greater than 100.5 F.   Also report any unusual redness, swelling, drainage, or pain         at your IV site.    You may report Symptoms to:  [facility] [place] [facility]          Phone: [phone], ECT Department           or Dr. Shary Keylapac's office [phone]  6)  Your next ECT Treatment is Day Wednesday  Date January 22, 2017 at 8am  We will call 2 days prior to your scheduled appointment for arrival times.  7)  Nothing to eat or drink after midnight the night before your procedure.  8)  Take normal morning meds(metoprolol, clozapine, zyprexa,effexor,thiothixene)    With a sip of water the morning of your procedure.  9)  Other Instructions: Call [phone] to cancel the morning of your procedure due         to illness or emergency.  10) We will call within 72 hours to assess how you are feeling.

## 2017-01-08 NOTE — Anesthesia Preprocedure Evaluation (Signed)
Anesthesia Evaluation  Patient identified by MRN, date of birth, ID band Patient awake    Reviewed: Allergy & Precautions, H&P , NPO status , Patient's Chart, lab work & pertinent test results, reviewed documented beta blocker date and time   Airway Mallampati: II   Neck ROM: full    Dental  (+) Poor Dentition   Pulmonary neg pulmonary ROS, former smoker,    Pulmonary exam normal        Cardiovascular negative cardio ROS Normal cardiovascular exam Rhythm:regular Rate:Normal     Neuro/Psych PSYCHIATRIC DISORDERS Schizophrenia negative neurological ROS  negative psych ROS   GI/Hepatic negative GI ROS, Neg liver ROS,   Endo/Other  negative endocrine ROS  Renal/GU negative Renal ROS  negative genitourinary   Musculoskeletal   Abdominal   Peds  Hematology negative hematology ROS (+)   Anesthesia Other Findings Past Medical History: No date: Schizophrenia (HCC) No date: Tachycardia History reviewed. No pertinent surgical history. BMI    Body Mass Index:  34.61 kg/m     Reproductive/Obstetrics negative OB ROS                             Anesthesia Physical Anesthesia Plan  ASA: III  Anesthesia Plan: General   Post-op Pain Management:    Induction:   PONV Risk Score and Plan:   Airway Management Planned:   Additional Equipment:   Intra-op Plan:   Post-operative Plan:   Informed Consent: I have reviewed the patients History and Physical, chart, labs and discussed the procedure including the risks, benefits and alternatives for the proposed anesthesia with the patient or authorized representative who has indicated his/her understanding and acceptance.   Dental Advisory Given  Plan Discussed with: CRNA  Anesthesia Plan Comments:         Anesthesia Quick Evaluation

## 2017-01-08 NOTE — Transfer of Care (Signed)
Immediate Anesthesia Transfer of Care Note  Patient: Jonathan Alexander  Procedure(s) Performed: ECT  Patient Location: PACU  Anesthesia Type:General  Level of Consciousness: sedated  Airway & Oxygen Therapy: Patient Spontanous Breathing  Post-op Assessment: Report given to RN and Post -op Vital signs reviewed and stable  Post vital signs: Reviewed and stable  Last Vitals:  Vitals:   01/08/17 0824 01/08/17 1028  BP: (!) 142/94 (P) 118/68  Pulse: 84 (!) (P) 101  Resp: 16 (P) 18  Temp: 36.1 C (P) 36.7 C    Last Pain:  Vitals:   01/08/17 0824  TempSrc: Oral         Complications: No apparent anesthesia complications

## 2017-01-08 NOTE — Anesthesia Post-op Follow-up Note (Cosign Needed)
Anesthesia QCDR form completed.        

## 2017-01-08 NOTE — H&P (Signed)
Jonathan Alexander is an 31 y.o. male.   Chief Complaint: No specific new complaint. No physical complaints HPI: History of schizoaffective disorder stable on medicine and regular ECT treatments  Past Medical History:  Diagnosis Date  . Schizophrenia (HCC)   . Tachycardia     History reviewed. No pertinent surgical history.  Family History  Problem Relation Age of Onset  . Family history unknown: Yes   Social History:  reports that he has quit smoking. His smoking use included Cigarettes. He quit after 4.00 years of use. He has never used smokeless tobacco. He reports that he does not drink alcohol or use drugs.  Allergies:  Allergies  Allergen Reactions  . Divalproex Sodium   . Shellfish-Derived Products      (Not in a hospital admission)  No results found for this or any previous visit (from the past 48 hour(s)). No results found.  Review of Systems  Constitutional: Negative.   HENT: Negative.   Eyes: Negative.   Respiratory: Negative.   Cardiovascular: Negative.   Gastrointestinal: Negative.   Musculoskeletal: Negative.   Skin: Negative.   Neurological: Negative.   Psychiatric/Behavioral: Negative.     Blood pressure (!) 142/94, pulse 84, temperature 97 F (36.1 C), temperature source Oral, resp. rate 16, height 5\' 7"  (1.702 m), weight 100.2 kg (221 lb), SpO2 97 %. Physical Exam  Nursing note and vitals reviewed. Constitutional: He appears well-developed and well-nourished.  HENT:  Head: Normocephalic and atraumatic.  Eyes: Conjunctivae are normal. Pupils are equal, round, and reactive to light.  Neck: Normal range of motion.  Cardiovascular: Regular rhythm and normal heart sounds.   Respiratory: Effort normal. No respiratory distress.  GI: Soft.  Musculoskeletal: Normal range of motion.  Neurological: He is alert.  Skin: Skin is warm and dry.  Psychiatric: Judgment normal. His affect is blunt. His speech is delayed. He is slowed. Cognition and memory are  normal. He expresses no homicidal and no suicidal ideation.     Assessment/Plan ECT today continue every 2 week schedule which appears to be well tolerated and helping him stay stable and outside the hospital.  Jonathan RasmussenJohn Nishi Neiswonger, MD 01/08/2017, 10:12 AM

## 2017-01-08 NOTE — Procedures (Signed)
ECT SERVICES Physician's Interval Evaluation & Treatment Note  Patient Identification: Alma FriendlyDemetrio Cretella MRN:  [id number] Date of Evaluation:  01/08/2017 TX #: 4  MADRS: 18  MMSE: 24  P.E. Findings:  Vitals unremarkable heart and lungs normal  Psychiatric Interval Note:  Patient seems a little bit more nervous today although still able to interact. No specific new complaints no concerns about any changes from his group home  Subjective:  Patient is a 31 y.o. male seen for evaluation for Electroconvulsive Therapy. No complaints  Treatment Summary:   []   Right Unilateral             [x]  Bilateral   % Energy : 1.0 ms 100%   Impedance: 1050 ohms  Seizure Energy Index: 13,115 V squared  Postictal Suppression Index: 96%  Seizure Concordance Index: 94%  Medications  Pre Shock: Robinul 0.2 mg Brevital 70 mg succinylcholine 100 mg  Post Shock: Versed 2 mg  Seizure Duration: 35 seconds EMG 41 seconds EEG   Comments: Follow-up 2 weeks usual schedule   Lungs:  [x]   Clear to auscultation               []  Other:   Heart:    [x]   Regular rhythm             []  irregular rhythm    [x]   Previous H&P reviewed, patient examined and there are NO CHANGES                 []   Previous H&P reviewed, patient examined and there are changes noted.   Mordecai RasmussenJohn Shauntavia Brackin, MD 6/6/201810:14 AM

## 2017-01-08 NOTE — Anesthesia Procedure Notes (Signed)
Date/Time: 01/08/2017 10:19 AM Performed by: Lily KocherPERALTA, Kyi Romanello Pre-anesthesia Checklist: Patient identified, Emergency Drugs available, Suction available and Patient being monitored Patient Re-evaluated:Patient Re-evaluated prior to inductionOxygen Delivery Method: Circle system utilized Preoxygenation: Pre-oxygenation with 100% oxygen Intubation Type: IV induction Ventilation: Mask ventilation without difficulty and Mask ventilation throughout procedure Airway Equipment and Method: Bite block Placement Confirmation: positive ETCO2 Dental Injury: Teeth and Oropharynx as per pre-operative assessment

## 2017-01-10 NOTE — Anesthesia Postprocedure Evaluation (Signed)
Anesthesia Post Note  Patient: Estate agentDemetrio Cubillos  Procedure(s) Performed: * No procedures listed *  Patient location during evaluation: PACU Anesthesia Type: General Level of consciousness: awake and alert Pain management: pain level controlled Vital Signs Assessment: post-procedure vital signs reviewed and stable Respiratory status: spontaneous breathing, nonlabored ventilation, respiratory function stable and patient connected to nasal cannula oxygen Cardiovascular status: blood pressure returned to baseline and stable Postop Assessment: no signs of nausea or vomiting Anesthetic complications: no     Last Vitals:  Vitals:   01/08/17 1120 01/08/17 1130  BP: 112/73 111/79  Pulse: 97 96  Resp: 13 16  Temp:      Last Pain:  Vitals:   01/10/17 1025  TempSrc:   PainSc: 0-No pain                 Yevette EdwardsJames G Khyla Mccumbers

## 2017-01-20 ENCOUNTER — Telehealth: Payer: Self-pay | Admitting: *Deleted

## 2017-01-22 ENCOUNTER — Encounter: Payer: Self-pay | Admitting: Anesthesiology

## 2017-01-22 ENCOUNTER — Encounter
Admission: RE | Admit: 2017-01-22 | Discharge: 2017-01-22 | Disposition: A | Discharge status: Home / Self Care | Payer: Medicare Other | Source: Ambulatory Visit | Attending: Psychiatry | Admitting: Psychiatry

## 2017-01-22 ENCOUNTER — Other Ambulatory Visit: Payer: Self-pay | Admitting: Psychiatry

## 2017-01-22 DIAGNOSIS — F251 Schizoaffective disorder, depressive type: Secondary | ICD-10-CM

## 2017-01-22 DIAGNOSIS — Z888 Allergy status to other drugs, medicaments and biological substances status: Secondary | ICD-10-CM | POA: Diagnosis not present

## 2017-01-22 DIAGNOSIS — Z87891 Personal history of nicotine dependence: Secondary | ICD-10-CM | POA: Diagnosis not present

## 2017-01-22 DIAGNOSIS — F319 Bipolar disorder, unspecified: Secondary | ICD-10-CM | POA: Diagnosis not present

## 2017-01-22 DIAGNOSIS — F2089 Other schizophrenia: Secondary | ICD-10-CM | POA: Diagnosis not present

## 2017-01-22 DIAGNOSIS — F209 Schizophrenia, unspecified: Secondary | ICD-10-CM | POA: Diagnosis not present

## 2017-01-22 MED ORDER — GLYCOPYRROLATE 0.2 MG/ML IJ SOLN
0.2000 mg | Freq: Once | INTRAMUSCULAR | Status: AC
  Administered 2017-01-22: 0.2 mg via INTRAVENOUS

## 2017-01-22 MED ORDER — SUCCINYLCHOLINE CHLORIDE 20 MG/ML IJ SOLN
INTRAMUSCULAR | Status: AC
  Filled 2017-01-22: qty 1

## 2017-01-22 MED ORDER — METHOHEXITAL SODIUM 0.5 G IJ SOLR
INTRAMUSCULAR | Status: AC
  Filled 2017-01-22: qty 500

## 2017-01-22 MED ORDER — SUCCINYLCHOLINE CHLORIDE 200 MG/10ML IV SOSY
PREFILLED_SYRINGE | INTRAVENOUS | Status: DC | PRN
  Administered 2017-01-22: 100 mg via INTRAVENOUS

## 2017-01-22 MED ORDER — SODIUM CHLORIDE 0.9 % IV SOLN
INTRAVENOUS | Status: DC | PRN
  Administered 2017-01-22: 10:00:00 via INTRAVENOUS

## 2017-01-22 MED ORDER — METHOHEXITAL SODIUM 100 MG/10ML IV SOSY
PREFILLED_SYRINGE | INTRAVENOUS | Status: DC | PRN
  Administered 2017-01-22: 70 mg via INTRAVENOUS

## 2017-01-22 MED ORDER — MIDAZOLAM HCL 2 MG/2ML IJ SOLN
INTRAMUSCULAR | Status: AC
  Filled 2017-01-22: qty 2

## 2017-01-22 MED ORDER — MIDAZOLAM HCL 2 MG/2ML IJ SOLN
2.0000 mg | Freq: Once | INTRAMUSCULAR | Status: AC
  Administered 2017-01-22: 2 mg via INTRAVENOUS

## 2017-01-22 MED ORDER — GLYCOPYRROLATE 0.2 MG/ML IJ SOLN
INTRAMUSCULAR | Status: AC
  Administered 2017-01-22: 0.2 mg via INTRAVENOUS
  Filled 2017-01-22: qty 1

## 2017-01-22 MED ORDER — SODIUM CHLORIDE 0.9 % IV SOLN
500.0000 mL | Freq: Once | INTRAVENOUS | Status: AC
  Administered 2017-01-22: 1000 mL via INTRAVENOUS

## 2017-01-22 NOTE — Anesthesia Preprocedure Evaluation (Signed)
Anesthesia Evaluation  Patient identified by MRN, date of birth, ID band Patient awake    Reviewed: Allergy & Precautions, NPO status , Patient's Chart, lab work & pertinent test results  Airway Mallampati: II       Dental  (+) Teeth Intact   Pulmonary COPD, former smoker,     + decreased breath sounds      Cardiovascular hypertension, Pt. on home beta blockers  Rhythm:Regular Rate:Normal     Neuro/Psych Schizophrenia    GI/Hepatic negative GI ROS, Neg liver ROS,   Endo/Other  negative endocrine ROS  Renal/GU negative Renal ROS     Musculoskeletal negative musculoskeletal ROS (+)   Abdominal Normal abdominal exam  (+)   Peds negative pediatric ROS (+)  Hematology   Anesthesia Other Findings   Reproductive/Obstetrics                             Anesthesia Physical Anesthesia Plan  ASA: II  Anesthesia Plan: General   Post-op Pain Management:    Induction: Intravenous  PONV Risk Score and Plan: 0  Airway Management Planned: Mask and Natural Airway  Additional Equipment:   Intra-op Plan:   Post-operative Plan:   Informed Consent: I have reviewed the patients History and Physical, chart, labs and discussed the procedure including the risks, benefits and alternatives for the proposed anesthesia with the patient or authorized representative who has indicated his/her understanding and acceptance.     Plan Discussed with: CRNA  Anesthesia Plan Comments:         Anesthesia Quick Evaluation

## 2017-01-22 NOTE — Progress Notes (Signed)
Total iv fluids 500

## 2017-01-22 NOTE — H&P (Signed)
Jonathan Alexander is an 31 y.o. male.   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 Schizophrenia (HCC)   . Tachycardia     History reviewed. No pertinent surgical history.  Family History  Problem Relation Age of Onset  . Family history unknown: Yes   Social History:  reports that he has quit smoking. His smoking use included Cigarettes. He quit after 4.00 years of use. He has never used smokeless tobacco. He reports that he does not drink alcohol or use drugs.  Allergies:  Allergies  Allergen Reactions  . Divalproex Sodium   . Shellfish-Derived Products      (Not in a hospital admission)  No results found for this or any previous visit (from the past 48 hour(s)). No results found.  Review of Systems  Constitutional: Negative.   HENT: Negative.   Eyes: Negative.   Respiratory: Negative.   Cardiovascular: Negative.   Gastrointestinal: Negative.   Musculoskeletal: Negative.   Skin: Negative.   Neurological: Negative.   Psychiatric/Behavioral: Negative for depression, hallucinations, memory loss, substance abuse and suicidal ideas. The patient is not nervous/anxious and does not have insomnia.     Blood pressure (!) 143/83, pulse 94, temperature 97.7 F (36.5 C), temperature source Oral, resp. rate 18, height 5\' 7"  (1.702 m), weight 221 lb (100.2 kg), SpO2 100 %. Physical Exam  Nursing note and vitals reviewed. Constitutional: He appears well-developed and well-nourished.  HENT:  Head: Normocephalic and atraumatic.  Eyes: Conjunctivae are normal. Pupils are equal, round, and reactive to light.  Neck: Normal range of motion.  Cardiovascular: Regular rhythm and normal heart sounds.   Respiratory: Effort normal. No respiratory distress.  GI: Soft.   Musculoskeletal: Normal range of motion.  Neurological: He is alert.  Skin: Skin is warm and dry.  Psychiatric: Judgment normal. His affect is blunt. His speech is delayed. He is slowed. He expresses no suicidal ideation. He exhibits abnormal recent memory.     Assessment/Plan Treatment today. Next scheduled time would fall on the holiday. We are going to negotiate probably having him come on July 6  Jonathan RasmussenJohn Clapacs, MD 01/22/2017, 10:09 AM

## 2017-01-22 NOTE — Transfer of Care (Signed)
Immediate Anesthesia Transfer of Care Note  Patient: Jonathan Alexander  Procedure(s) Performed: ECT  Patient Location: PACU  Anesthesia Type:General  Level of Consciousness: sedated  Airway & Oxygen Therapy: Patient Spontanous Breathing  Post-op Assessment: Report given to RN and Post -op Vital signs reviewed and stable  Post vital signs: Reviewed and stable  Last Vitals:  Vitals:   01/22/17 0827 01/22/17 1029  BP: (!) 143/83 (!) 145/54  Pulse: 94 (!) 102  Resp: 18 18  Temp: 36.5 C 37.6 C    Last Pain:  Vitals:   01/22/17 0827  TempSrc: Oral         Complications: No apparent anesthesia complications

## 2017-01-22 NOTE — Anesthesia Postprocedure Evaluation (Signed)
Anesthesia Post Note  Patient: Jonathan Alexander  Procedure(s) Performed: * No procedures listed *  Patient location during evaluation: PACU Anesthesia Type: General Level of consciousness: awake Pain management: pain level controlled Vital Signs Assessment: post-procedure vital signs reviewed and stable Respiratory status: spontaneous breathing Cardiovascular status: stable Anesthetic complications: no     Last Vitals:  Vitals:   01/22/17 1112 01/22/17 1118  BP:  127/88  Pulse: 99 99  Resp: 11 16  Temp: 36.8 C     Last Pain:  Vitals:   01/22/17 1118  TempSrc: Oral                 VAN STAVEREN,Kalie Cabral

## 2017-01-22 NOTE — Anesthesia Procedure Notes (Signed)
Date/Time: 01/22/2017 10:20 AM Performed by: Lily KocherPERALTA, Jonathan Whobrey Pre-anesthesia Checklist: Patient identified, Emergency Drugs available, Suction available and Patient being monitored Patient Re-evaluated:Patient Re-evaluated prior to inductionOxygen Delivery Method: Circle system utilized Preoxygenation: Pre-oxygenation with 100% oxygen Intubation Type: IV induction Ventilation: Mask ventilation without difficulty and Mask ventilation throughout procedure Airway Equipment and Method: Bite block Placement Confirmation: positive ETCO2 Dental Injury: Teeth and Oropharynx as per pre-operative assessment

## 2017-01-22 NOTE — Discharge Instructions (Signed)
1)  The drugs that you have been given will stay in your system until tomorrow so for the       next 24 hours you should not:  A. Drive an automobile  B. Make any legal decisions  C. Drink any alcoholic beverages  2)  You may resume your regular meals upon return home.  3)  A responsible adult must take you home.  Someone should stay with you for a few          hours, then be available by phone for the remainder of the treatment day.  4)  You May experience any of the following symptoms:  Headache, Nausea and a dry mouth (due to the medications you were given),  temporary memory loss and some confusion, or sore muscles (a warm bath  should help this).  If you you experience any of these symptoms let us know on                your return visit.  5)  Report any of the following: any acute discomfort, severe headache, or temperature        greater than 100.5 F.   Also report any unusual redness, swelling, drainage, or pain         at your IV site.    You may report Symptoms to:  [facility] [place] [facility]          Phone: [phone], ECT Department           or Dr. Shary Keylapac's office [phone]  6)  Your next ECT Treatment is Day Friday Date February 07 2017  We will call 2 days prior to your scheduled appointment for arrival times.  7)  Nothing to eat or drink after midnight the night before your procedure.  8)  Take morning meds   With a sip of water the morning of your procedure.  9)  Other Instructions: Call [phone] to cancel the morning of your procedure due         to illness or emergency.  10) We will call within 72 hours to assess how you are feeling.

## 2017-01-22 NOTE — Procedures (Signed)
ECT SERVICES Physician's Interval Evaluation & Treatment Note  Patient Identification: Jonathan FriendlyDemetrio Alexander MRN:  [id number] Date of Evaluation:  01/22/2017 TX #: 5  MADRS:   MMSE:   P.E. Findings:  No new physical exam findings. Vitals unremarkable. Heart and lungs clear  Psychiatric Interval Note:  Affect blunted. Patient withdrawn. Same presentation as usual.  Subjective:  Patient is a 31 y.o. male seen for evaluation for Electroconvulsive Therapy. No specific complaints from the patient or the group home  Treatment Summary:   []   Right Unilateral             [x]  Bilateral   % Energy : 1.0 ms 100%   Impedance: 1700 ohms  Seizure Energy Index: 14,188 V squared  Postictal Suppression Index: 83%  Seizure Concordance Index: 97%  Medications  Pre Shock: Robinul 0.2 mg Brevital 70 mg succinylcholine 100 mg  Post Shock: Versed 2 mg  Seizure Duration: 33 seconds by EMG 35 seconds by EEG   Comments: Follow-up in 2 weeks although that would strictly fall on the holiday we are probably therefore going to have him come in on Friday, July 6 if possible   Lungs:  [x]   Clear to auscultation               []  Other:   Heart:    [x]   Regular rhythm             []  irregular rhythm    [x]   Previous H&P reviewed, patient examined and there are NO CHANGES                 []   Previous H&P reviewed, patient examined and there are changes noted.   Mordecai RasmussenJohn Lorris Carducci, MD 6/20/201810:12 AM

## 2017-01-22 NOTE — Anesthesia Post-op Follow-up Note (Cosign Needed)
Anesthesia QCDR form completed.        

## 2017-02-03 ENCOUNTER — Telehealth: Payer: Self-pay

## 2017-02-06 ENCOUNTER — Other Ambulatory Visit: Payer: Self-pay | Admitting: Psychiatry

## 2017-02-07 ENCOUNTER — Ambulatory Visit
Admission: RE | Admit: 2017-02-07 | Discharge: 2017-02-07 | Disposition: A | Discharge status: Home / Self Care | Payer: Medicare Other | Source: Ambulatory Visit | Attending: Psychiatry | Admitting: Psychiatry

## 2017-02-07 ENCOUNTER — Encounter: Payer: Self-pay | Admitting: Anesthesiology

## 2017-02-07 DIAGNOSIS — R000 Tachycardia, unspecified: Secondary | ICD-10-CM | POA: Diagnosis not present

## 2017-02-07 DIAGNOSIS — F259 Schizoaffective disorder, unspecified: Secondary | ICD-10-CM | POA: Insufficient documentation

## 2017-02-07 DIAGNOSIS — Z87891 Personal history of nicotine dependence: Secondary | ICD-10-CM | POA: Diagnosis not present

## 2017-02-07 DIAGNOSIS — F250 Schizoaffective disorder, bipolar type: Secondary | ICD-10-CM | POA: Diagnosis not present

## 2017-02-07 DIAGNOSIS — F2089 Other schizophrenia: Secondary | ICD-10-CM | POA: Diagnosis not present

## 2017-02-07 MED ORDER — SODIUM CHLORIDE 0.9 % IV SOLN
INTRAVENOUS | Status: DC | PRN
  Administered 2017-02-07: 10:00:00 via INTRAVENOUS

## 2017-02-07 MED ORDER — SUCCINYLCHOLINE CHLORIDE 200 MG/10ML IV SOSY
PREFILLED_SYRINGE | INTRAVENOUS | Status: DC | PRN
  Administered 2017-02-07: 100 mg via INTRAVENOUS

## 2017-02-07 MED ORDER — GLYCOPYRROLATE 0.2 MG/ML IJ SOLN
0.2000 mg | Freq: Once | INTRAMUSCULAR | Status: AC
  Administered 2017-02-07: 0.2 mg via INTRAVENOUS

## 2017-02-07 MED ORDER — MIDAZOLAM HCL 2 MG/2ML IJ SOLN
2.0000 mg | Freq: Once | INTRAMUSCULAR | Status: AC
  Administered 2017-02-07: 2 mg via INTRAVENOUS

## 2017-02-07 MED ORDER — SUCCINYLCHOLINE CHLORIDE 20 MG/ML IJ SOLN
INTRAMUSCULAR | Status: AC
  Filled 2017-02-07: qty 1

## 2017-02-07 MED ORDER — GLYCOPYRROLATE 0.2 MG/ML IJ SOLN
INTRAMUSCULAR | Status: AC
  Filled 2017-02-07: qty 1

## 2017-02-07 MED ORDER — MIDAZOLAM HCL 2 MG/2ML IJ SOLN
INTRAMUSCULAR | Status: AC
  Filled 2017-02-07: qty 2

## 2017-02-07 MED ORDER — SODIUM CHLORIDE 0.9 % IV SOLN
500.0000 mL | Freq: Once | INTRAVENOUS | Status: AC
  Administered 2017-02-07: 1000 mL via INTRAVENOUS

## 2017-02-07 MED ORDER — METHOHEXITAL SODIUM 0.5 G IJ SOLR
INTRAMUSCULAR | Status: AC
  Filled 2017-02-07: qty 500

## 2017-02-07 MED ORDER — METHOHEXITAL SODIUM 100 MG/10ML IV SOSY
PREFILLED_SYRINGE | INTRAVENOUS | Status: DC | PRN
  Administered 2017-02-07: 70 mg via INTRAVENOUS

## 2017-02-07 NOTE — Progress Notes (Signed)
Caregiver Launa Flightobert Johnson at bedside of patient, Patient will only answer his name, no other questions, Asked his caregiver where he is going.

## 2017-02-07 NOTE — Progress Notes (Signed)
Patient remains very drowsy, slow to awaken.

## 2017-02-07 NOTE — Anesthesia Postprocedure Evaluation (Signed)
Anesthesia Post Note  Patient: Estate agentDemetrio Cuppett  Procedure(s) Performed: * No procedures listed *  Patient location during evaluation: PACU Anesthesia Type: General Level of consciousness: awake and alert Pain management: pain level controlled Vital Signs Assessment: post-procedure vital signs reviewed and stable Respiratory status: spontaneous breathing, nonlabored ventilation, respiratory function stable and patient connected to nasal cannula oxygen Cardiovascular status: blood pressure returned to baseline and stable Postop Assessment: no signs of nausea or vomiting Anesthetic complications: no     Last Vitals:  Vitals:   02/07/17 1125 02/07/17 1133  BP: (!) 142/90 (!) 129/92  Pulse: 95 94  Resp: 11 16  Temp:      Last Pain:  Vitals:   02/07/17 1045  PainSc: Asleep                 Breia Ocampo S

## 2017-02-07 NOTE — Progress Notes (Signed)
Patrice with ECT at bedside, patient does arouse To voice, gave staff his name.

## 2017-02-07 NOTE — Anesthesia Post-op Follow-up Note (Cosign Needed)
Anesthesia QCDR form completed.        

## 2017-02-07 NOTE — Anesthesia Procedure Notes (Signed)
Date/Time: 02/07/2017 10:17 AM Performed by: Lily KocherPERALTA, Terique Kawabata Pre-anesthesia Checklist: Patient identified, Emergency Drugs available, Suction available and Patient being monitored Patient Re-evaluated:Patient Re-evaluated prior to inductionOxygen Delivery Method: Circle system utilized Preoxygenation: Pre-oxygenation with 100% oxygen Intubation Type: IV induction Ventilation: Mask ventilation without difficulty and Mask ventilation throughout procedure Airway Equipment and Method: Bite block Placement Confirmation: positive ETCO2 Dental Injury: Teeth and Oropharynx as per pre-operative assessment

## 2017-02-07 NOTE — Procedures (Signed)
ECT SERVICES Physician's Interval Evaluation & Treatment Note  Patient Identification: Jonathan FriendlyDemetrio Alexander MRN:  [id number] Date of Evaluation:  02/07/2017 TX #: 6  MADRS:   MMSE:   P.E. Findings:  Heart and lungs normal vitals normal  Psychiatric Interval Note:  No change no new complaints no return of major mood swings  Subjective:  Patient is a 31 y.o. male seen for evaluation for Electroconvulsive Therapy. No new complaints physically doing well  Treatment Summary:   []   Right Unilateral             [x]  Bilateral   % Energy : 1.0 ms 100%   Impedance: 640 ohms  Seizure Energy Index: 12,037 V squared  Postictal Suppression Index: 66%  Seizure Concordance Index: 95%  Medications  Pre Shock: Robinul 0.2 mg Brevital 70 mg succinylcholine 100 mg  Post Shock: Versed 2 mg  Seizure Duration: 28 seconds EMG, 28 seconds EEG   Comments: No change to routine schedule. Follow-up 2 weeks   Lungs:  [x]   Clear to auscultation               []  Other:   Heart:    [x]   Regular rhythm             []  irregular rhythm    [x]   Previous H&P reviewed, patient examined and there are NO CHANGES                 []   Previous H&P reviewed, patient examined and there are changes noted.   Mordecai RasmussenJohn Adianna Darwin, MD 7/6/201810:11 AM

## 2017-02-07 NOTE — Transfer of Care (Signed)
Immediate Anesthesia Transfer of Care Note  Patient: Jonathan Alexander  Procedure(s) Performed: ECT  Patient Location: PACU  Anesthesia Type:General  Level of Consciousness: sedated  Airway & Oxygen Therapy: Patient Spontanous Breathing  Post-op Assessment: Report given to RN and Post -op Vital signs reviewed and stable  Post vital signs: Reviewed and stable  Last Vitals:  Vitals:   02/07/17 0929 02/07/17 1028  BP: (!) 134/3 138/71  Pulse: 95 (!) 103  Resp: 16 (!) 0  Temp: 36.6 C 36.8 C    Last Pain: There were no vitals filed for this visit.       Complications: No apparent anesthesia complications

## 2017-02-07 NOTE — Discharge Instructions (Signed)
1)  The drugs that you have been given will stay in your system until tomorrow so for the       next 24 hours you should not:  A. Drive an automobile  B. Make any legal decisions  C. Drink any alcoholic beverages  2)  You may resume your regular meals upon return home.  3)  A responsible adult must take you home.  Someone should stay with you for a few          hours, then be available by phone for the remainder of the treatment day.  4)  You May experience any of the following symptoms:  Headache, Nausea and a dry mouth (due to the medications you were given),  temporary memory loss and some confusion, or sore muscles (a warm bath  should help this).  If you you experience any of these symptoms let us know on                your return visit.  5)  Report any of the following: any acute discomfort, severe headache, or temperature        greater than 100.5 F.   Also report any unusual redness, swelling, drainage, or pain         at your IV site.    You may report Symptoms to:  ECT PROGRAM- [place] [facility]          Phone: [phone], ECT Department           or Dr. Shary Keylapac's office [phone]  6)  Your next ECT Treatment is Day Wednesday  Date July 18th 2018  We will call 2 days prior to your scheduled appointment for arrival times.  7)  Nothing to eat or drink after midnight the night before your procedure.  8)  Take all morning meds   With a sip of water the morning of your procedure.  9)  Other Instructions: Call [phone] to cancel the morning of your procedure due         to illness or emergency.  10) We will call within 72 hours to assess how you are feeling.

## 2017-02-07 NOTE — Anesthesia Preprocedure Evaluation (Signed)
Anesthesia Evaluation  Patient identified by MRN, date of birth, ID band Patient awake    Reviewed: Allergy & Precautions, NPO status , Patient's Chart, lab work & pertinent test results, reviewed documented beta blocker date and time   Airway Mallampati: III  TM Distance: >3 FB     Dental  (+) Chipped   Pulmonary former smoker,           Cardiovascular      Neuro/Psych    GI/Hepatic   Endo/Other    Renal/GU      Musculoskeletal   Abdominal   Peds  Hematology   Anesthesia Other Findings Obese.  Reproductive/Obstetrics                             Anesthesia Physical Anesthesia Plan  ASA: III  Anesthesia Plan: General   Post-op Pain Management:    Induction: Intravenous  PONV Risk Score and Plan:   Airway Management Planned:   Additional Equipment:   Intra-op Plan:   Post-operative Plan:   Informed Consent: I have reviewed the patients History and Physical, chart, labs and discussed the procedure including the risks, benefits and alternatives for the proposed anesthesia with the patient or authorized representative who has indicated his/her understanding and acceptance.     Plan Discussed with: CRNA  Anesthesia Plan Comments:         Anesthesia Quick Evaluation  

## 2017-02-07 NOTE — H&P (Signed)
Jonathan Alexander is an 31 y.o. male.   Chief Complaint: 31 year old man with schizoaffective disorder who receives regular maintenance ECT every 2 weeks. No new complaints. No change to condition HPI: Patient is doing well no new medical or psychiatric problems. Has responded well and maintain stability with current schedule  Past Medical History:  Diagnosis Date  . Schizophrenia (HCC)   . Tachycardia     History reviewed. No pertinent surgical history.  Family History  Problem Relation Age of Onset  . Family history unknown: Yes   Social History:  reports that he has quit smoking. His smoking use included Cigarettes. He quit after 4.00 years of use. He has never used smokeless tobacco. He reports that he does not drink alcohol or use drugs.  Allergies:  Allergies  Allergen Reactions  . Divalproex Sodium   . Shellfish-Derived Products      (Not in a hospital admission)  No results found for this or any previous visit (from the past 48 hour(s)). No results found.  Review of Systems  Constitutional: Negative.   HENT: Negative.   Eyes: Negative.   Respiratory: Negative.   Cardiovascular: Negative.   Gastrointestinal: Negative.   Musculoskeletal: Negative.   Skin: Negative.   Neurological: Negative.   Psychiatric/Behavioral: Negative for depression, hallucinations, memory loss, substance abuse and suicidal ideas. The patient is nervous/anxious. The patient does not have insomnia.     Blood pressure (!) 134/3, pulse 95, temperature 97.8 F (36.6 C), resp. rate 16, height 5\' 7"  (1.702 m), weight 99.3 kg (219 lb), SpO2 100 %. Physical Exam  Nursing note and vitals reviewed. Constitutional: He appears well-developed and well-nourished.  HENT:  Head: Normocephalic and atraumatic.  Eyes: Conjunctivae are normal. Pupils are equal, round, and reactive to light.  Neck: Normal range of motion.  Cardiovascular: Regular rhythm and normal heart sounds.   Respiratory: Effort  normal. No respiratory distress.  GI: Soft.  Musculoskeletal: Normal range of motion.  Neurological: He is alert.  Skin: Skin is warm and dry.  Psychiatric: Judgment normal. His affect is blunt. His speech is delayed. He is slowed and withdrawn. Cognition and memory are impaired. He expresses no homicidal and no suicidal ideation.     Assessment/Plan Treatment today follow-up in 2 weeks no change to overall schedule no change to medicine.  Jonathan RasmussenJohn Regis Hinton, MD 02/07/2017, 10:09 AM

## 2017-02-07 NOTE — Progress Notes (Signed)
Patient taken around to get ready for discharge home. Patient assited up in a recilner.

## 2017-02-10 ENCOUNTER — Telehealth: Payer: Self-pay

## 2017-02-12 ENCOUNTER — Telehealth: Payer: Self-pay | Admitting: *Deleted

## 2017-02-14 ENCOUNTER — Encounter: Payer: Self-pay | Admitting: *Deleted

## 2017-02-14 ENCOUNTER — Telehealth: Payer: Self-pay | Admitting: *Deleted

## 2017-02-17 ENCOUNTER — Telehealth: Payer: Self-pay | Admitting: *Deleted

## 2017-02-18 ENCOUNTER — Other Ambulatory Visit: Payer: Self-pay | Admitting: Psychiatry

## 2017-02-19 ENCOUNTER — Encounter: Payer: Self-pay | Admitting: Anesthesiology

## 2017-02-19 ENCOUNTER — Encounter
Admission: RE | Admit: 2017-02-19 | Discharge: 2017-02-19 | Disposition: A | Discharge status: Home / Self Care | Payer: Medicare Other | Source: Ambulatory Visit | Attending: Psychiatry | Admitting: Psychiatry

## 2017-02-19 DIAGNOSIS — F258 Other schizoaffective disorders: Secondary | ICD-10-CM | POA: Insufficient documentation

## 2017-02-19 DIAGNOSIS — F250 Schizoaffective disorder, bipolar type: Secondary | ICD-10-CM

## 2017-02-19 DIAGNOSIS — Z87891 Personal history of nicotine dependence: Secondary | ICD-10-CM | POA: Diagnosis not present

## 2017-02-19 DIAGNOSIS — F2089 Other schizophrenia: Secondary | ICD-10-CM | POA: Diagnosis not present

## 2017-02-19 MED ORDER — MIDAZOLAM HCL 2 MG/2ML IJ SOLN
2.0000 mg | Freq: Once | INTRAMUSCULAR | Status: AC
  Administered 2017-02-19: 1 mg via INTRAVENOUS

## 2017-02-19 MED ORDER — SUCCINYLCHOLINE CHLORIDE 200 MG/10ML IV SOSY
PREFILLED_SYRINGE | INTRAVENOUS | Status: DC | PRN
  Administered 2017-02-19: 100 mg via INTRAVENOUS

## 2017-02-19 MED ORDER — GLYCOPYRROLATE 0.2 MG/ML IJ SOLN
0.2000 mg | Freq: Once | INTRAMUSCULAR | Status: AC
  Administered 2017-02-19: 0.2 mg via INTRAVENOUS

## 2017-02-19 MED ORDER — METHOHEXITAL SODIUM 100 MG/10ML IV SOSY
PREFILLED_SYRINGE | INTRAVENOUS | Status: DC | PRN
  Administered 2017-02-19: 70 mg via INTRAVENOUS

## 2017-02-19 MED ORDER — SUCCINYLCHOLINE CHLORIDE 20 MG/ML IJ SOLN
INTRAMUSCULAR | Status: AC
  Filled 2017-02-19: qty 1

## 2017-02-19 MED ORDER — MIDAZOLAM HCL 2 MG/2ML IJ SOLN
INTRAMUSCULAR | Status: AC
  Filled 2017-02-19: qty 2

## 2017-02-19 MED ORDER — GLYCOPYRROLATE 0.2 MG/ML IJ SOLN
INTRAMUSCULAR | Status: AC
  Administered 2017-02-19: 0.2 mg via INTRAVENOUS
  Filled 2017-02-19: qty 1

## 2017-02-19 MED ORDER — SODIUM CHLORIDE 0.9 % IV SOLN
500.0000 mL | Freq: Once | INTRAVENOUS | Status: AC
  Administered 2017-02-19: 1000 mL via INTRAVENOUS

## 2017-02-19 MED ORDER — SODIUM CHLORIDE 0.9 % IV SOLN
INTRAVENOUS | Status: DC | PRN
Start: 2017-02-19 — End: 2017-02-19
  Administered 2017-02-19: 09:00:00 via INTRAVENOUS

## 2017-02-19 NOTE — Discharge Instructions (Signed)
1)  The drugs that you have been given will stay in your system until tomorrow so for the       next 24 hours you should not:  A. Drive an automobile  B. Make any legal decisions  C. Drink any alcoholic beverages  2)  You may resume your regular meals upon return home.  3)  A responsible adult must take you home.  Someone should stay with you for a few          hours, then be available by phone for the remainder of the treatment day.  4)  You May experience any of the following symptoms:  Headache, Nausea and a dry mouth (due to the medications you were given),  temporary memory loss and some confusion, or sore muscles (a warm bath  should help this).  If you you experience any of these symptoms let us know on                your return visit.  5)  Report any of the following: any acute discomfort, severe headache, or temperature        greater than 100.5 F.   Also report any unusual redness, swelling, drainage, or pain         at your IV site.    You may report Symptoms to:  ECT [facility]- [place] [facility]          Phone: [phone], ECT Department           or Dr. Shary Keylapac's office [phone]  6)  Your next ECT Treatment is Day  Wednesday    Date  March 05, 2017  We will call 2 days prior to your scheduled appointment for arrival times.  7)  Nothing to eat or drink after midnight the night before your procedure.  8)  Take all morning meds     With a sip of water the morning of your procedure.  9)  Other Instructions: Call [phone] to cancel the morning of your procedure due         to illness or emergency.  10) We will call within 72 hours to assess how you are feeling.

## 2017-02-19 NOTE — Anesthesia Procedure Notes (Signed)
Date/Time: 02/19/2017 10:21 AM Performed by: Lily KocherPERALTA, Jonathan Holtzer Pre-anesthesia Checklist: Patient identified, Emergency Drugs available, Suction available and Patient being monitored Patient Re-evaluated:Patient Re-evaluated prior to induction Oxygen Delivery Method: Circle system utilized Preoxygenation: Pre-oxygenation with 100% oxygen Induction Type: IV induction Ventilation: Mask ventilation without difficulty and Mask ventilation throughout procedure Airway Equipment and Method: Bite block Placement Confirmation: positive ETCO2 Dental Injury: Teeth and Oropharynx as per pre-operative assessment

## 2017-02-19 NOTE — Progress Notes (Signed)
Very sleepy but arousable

## 2017-02-19 NOTE — Anesthesia Postprocedure Evaluation (Signed)
Anesthesia Post Note  Patient: Estate agentDemetrio Coey  Procedure(s) Performed: * No procedures listed *  Patient location during evaluation: PACU Anesthesia Type: General Level of consciousness: awake and alert Pain management: pain level controlled Vital Signs Assessment: post-procedure vital signs reviewed and stable Respiratory status: spontaneous breathing, nonlabored ventilation, respiratory function stable and patient connected to nasal cannula oxygen Cardiovascular status: blood pressure returned to baseline and stable Postop Assessment: no signs of nausea or vomiting Anesthetic complications: no     Last Vitals:  Vitals:   02/19/17 1122 02/19/17 1139  BP: 130/88 124/82  Pulse: (!) 110 (!) 109  Resp: 14 15  Temp: 37 C     Last Pain:  Vitals:   02/19/17 1139  TempSrc:   PainSc: 0-No pain                 Yevette EdwardsJames G Adams

## 2017-02-19 NOTE — H&P (Signed)
Jonathan Alexander is an 31 y.o. male.   Chief Complaint: No new complaint. Chronic withdrawn flat mood HPI: Schizoaffective disorder stable on medication and maintenance ECT  Past Medical History:  Diagnosis Date  . Schizophrenia (HCC)   . Tachycardia     History reviewed. No pertinent surgical history.  Family History  Problem Relation Age of Onset  . Family history unknown: Yes   Social History:  reports that he has quit smoking. His smoking use included Cigarettes. He quit after 4.00 years of use. He has never used smokeless tobacco. He reports that he does not drink alcohol or use drugs.  Allergies:  Allergies  Allergen Reactions  . Divalproex Sodium   . Shellfish-Derived Products      (Not in a hospital admission)  No results found for this or any previous visit (from the past 48 hour(s)). No results found.  Review of Systems  Constitutional: Negative.   HENT: Negative.   Eyes: Negative.   Respiratory: Negative.   Cardiovascular: Negative.   Gastrointestinal: Negative.   Musculoskeletal: Negative.   Skin: Negative.   Neurological: Negative.   Psychiatric/Behavioral: Negative.     Blood pressure (!) 135/95, pulse 98, temperature 97.8 F (36.6 C), temperature source Oral, resp. rate 18, height 5\' 7"  (1.702 m), weight 102.5 kg (226 lb), SpO2 100 %. Physical Exam  Nursing note and vitals reviewed. Constitutional: He appears well-developed and well-nourished.  HENT:  Head: Normocephalic and atraumatic.  Eyes: Pupils are equal, round, and reactive to light. Conjunctivae are normal.  Neck: Normal range of motion.  Cardiovascular: Regular rhythm and normal heart sounds.   Respiratory: Effort normal. No respiratory distress.  GI: Soft.  Musculoskeletal: Normal range of motion.  Neurological: He is alert.  Skin: Skin is warm and dry.  Psychiatric: Judgment normal. His affect is blunt. His speech is delayed. He is slowed and withdrawn. Cognition and memory are  normal. He expresses no suicidal ideation.     Assessment/Plan No change to current treatment plan a follow-up treatment every 2 weeks.  Jonathan RasmussenJohn Skylor Schnapp, MD 02/19/2017, 10:12 AM

## 2017-02-19 NOTE — Progress Notes (Signed)
Dr Pernell Dupreadams called  Heart rate 110  No new orders at present  Knows name and birthday    Leave iv in at present

## 2017-02-19 NOTE — Anesthesia Preprocedure Evaluation (Signed)
Anesthesia Evaluation  Patient identified by MRN, date of birth, ID band Patient awake    Reviewed: Allergy & Precautions, H&P , NPO status , Patient's Chart, lab work & pertinent test results, reviewed documented beta blocker date and time   Airway Mallampati: II   Neck ROM: full    Dental  (+) Teeth Intact   Pulmonary neg pulmonary ROS, former smoker,    Pulmonary exam normal        Cardiovascular negative cardio ROS Normal cardiovascular exam Rhythm:regular Rate:Normal     Neuro/Psych PSYCHIATRIC DISORDERS negative neurological ROS  negative psych ROS   GI/Hepatic negative GI ROS, Neg liver ROS,   Endo/Other  negative endocrine ROS  Renal/GU negative Renal ROS  negative genitourinary   Musculoskeletal   Abdominal   Peds  Hematology negative hematology ROS (+)   Anesthesia Other Findings Past Medical History: No date: Schizophrenia (HCC) No date: Tachycardia History reviewed. No pertinent surgical history. BMI    Body Mass Index:  35.40 kg/m     Reproductive/Obstetrics negative OB ROS                             Anesthesia Physical Anesthesia Plan  ASA: II  Anesthesia Plan: General   Post-op Pain Management:    Induction:   PONV Risk Score and Plan: 2 and Ondansetron and Dexamethasone  Airway Management Planned:   Additional Equipment:   Intra-op Plan:   Post-operative Plan:   Informed Consent: I have reviewed the patients History and Physical, chart, labs and discussed the procedure including the risks, benefits and alternatives for the proposed anesthesia with the patient or authorized representative who has indicated his/her understanding and acceptance.   Dental Advisory Given  Plan Discussed with: CRNA  Anesthesia Plan Comments:         Anesthesia Quick Evaluation

## 2017-02-19 NOTE — Anesthesia Post-op Follow-up Note (Cosign Needed)
Anesthesia QCDR form completed.        

## 2017-02-19 NOTE — Procedures (Signed)
ECT SERVICES Physician's Interval Evaluation & Treatment Note  Patient Identification: Jonathan Alexander MRN:  [id number] Date of Evaluation:  02/19/2017 TX #: 7  MADRS:   MMSE:   P.E. Findings:  No change to physical exam.  Psychiatric Interval Note:  Mood and affect flat and blunted about the same  Subjective:  Patient is a 31 y.o. male seen for evaluation for Electroconvulsive Therapy. No specific new complaint  Treatment Summary:   []   Right Unilateral             [x]  Bilateral   % Energy : 1.0 ms 100%   Impedance: 1030 ohms  Seizure Energy Index: 9703 V squared  Postictal Suppression Index: 69%  Seizure Concordance Index: 97%  Medications  Pre Shock: Robinul 0.2 mg Brevital 70 mg succinylcholine 100 mg  Post Shock: Versed 2 mg  Seizure Duration: EMG 35 seconds EEG 41 seconds   Comments: Continue maintenance ECT every 2 weeks   Lungs:  [x]   Clear to auscultation               []  Other:   Heart:    [x]   Regular rhythm             []  irregular rhythm    [x]   Previous H&P reviewed, patient examined and there are NO CHANGES                 []   Previous H&P reviewed, patient examined and there are changes noted.   Jonathan RasmussenJohn Deyci Gesell, MD 7/18/201810:13 AM

## 2017-02-19 NOTE — Transfer of Care (Addendum)
Immediate Anesthesia Transfer of Care Note  Patient: Jonathan Alexander  Procedure(s) Performed: ECT  Patient Location: PACU  Anesthesia Type:General  Level of Consciousness: sedated  Airway & Oxygen Therapy: Patient Spontanous Breathing and Patient connected to nasal cannula oxygen  Post-op Assessment: Report given to RN and Post -op Vital signs reviewed and stable  Post vital signs: Reviewed and stable  Last Vitals:  Vitals:   02/19/17 1052 02/19/17 1102  BP: 131/85 132/86  Pulse: (!) 110 (!) 111  Resp: 14 14  Temp:      Last Pain:  Vitals:   02/19/17 0856  TempSrc: Oral         Complications: No apparent anesthesia complications

## 2017-03-03 ENCOUNTER — Telehealth: Payer: Self-pay

## 2017-03-04 ENCOUNTER — Other Ambulatory Visit: Payer: Self-pay | Admitting: Psychiatry

## 2017-03-05 ENCOUNTER — Encounter: Payer: Self-pay | Admitting: Anesthesiology

## 2017-03-05 ENCOUNTER — Ambulatory Visit
Admission: RE | Admit: 2017-03-05 | Discharge: 2017-03-05 | Disposition: A | Discharge status: Home / Self Care | Payer: Medicare Other | Source: Ambulatory Visit | Attending: Psychiatry | Admitting: Psychiatry

## 2017-03-05 DIAGNOSIS — Z87891 Personal history of nicotine dependence: Secondary | ICD-10-CM | POA: Diagnosis not present

## 2017-03-05 DIAGNOSIS — F250 Schizoaffective disorder, bipolar type: Secondary | ICD-10-CM | POA: Diagnosis not present

## 2017-03-05 DIAGNOSIS — Z79899 Other long term (current) drug therapy: Secondary | ICD-10-CM | POA: Insufficient documentation

## 2017-03-05 DIAGNOSIS — F2089 Other schizophrenia: Secondary | ICD-10-CM | POA: Diagnosis not present

## 2017-03-05 DIAGNOSIS — F259 Schizoaffective disorder, unspecified: Secondary | ICD-10-CM | POA: Insufficient documentation

## 2017-03-05 MED ORDER — FENTANYL CITRATE (PF) 100 MCG/2ML IJ SOLN: 25.0000 ug | INTRAMUSCULAR | Status: DC | PRN

## 2017-03-05 MED ORDER — SODIUM CHLORIDE 0.9 % IV SOLN
500.0000 mL | Freq: Once | INTRAVENOUS | Status: AC
  Administered 2017-03-05: 1000 mL via INTRAVENOUS

## 2017-03-05 MED ORDER — METHOHEXITAL SODIUM 0.5 G IJ SOLR
INTRAMUSCULAR | Status: AC
  Filled 2017-03-05: qty 500

## 2017-03-05 MED ORDER — GLYCOPYRROLATE 0.2 MG/ML IJ SOLN
0.2000 mg | Freq: Once | INTRAMUSCULAR | Status: AC
  Administered 2017-03-05: 0.2 mg via INTRAVENOUS

## 2017-03-05 MED ORDER — ONDANSETRON HCL 4 MG/2ML IJ SOLN: 4.0000 mg | Freq: Once | INTRAMUSCULAR | Status: DC | PRN

## 2017-03-05 MED ORDER — MIDAZOLAM HCL 5 MG/5ML IJ SOLN
INTRAMUSCULAR | Status: DC | PRN
  Administered 2017-03-05: 1 mg via INTRAVENOUS

## 2017-03-05 MED ORDER — GLYCOPYRROLATE 0.2 MG/ML IJ SOLN
INTRAMUSCULAR | Status: AC
  Filled 2017-03-05: qty 1

## 2017-03-05 MED ORDER — METHOHEXITAL SODIUM 100 MG/10ML IV SOSY
PREFILLED_SYRINGE | INTRAVENOUS | Status: DC | PRN
Start: 2017-03-05 — End: 2017-03-05
  Administered 2017-03-05: 70 mg via INTRAVENOUS

## 2017-03-05 MED ORDER — MIDAZOLAM HCL 2 MG/2ML IJ SOLN: 2.0000 mg | Freq: Once | INTRAMUSCULAR | Status: DC

## 2017-03-05 MED ORDER — SODIUM CHLORIDE 0.9 % IV SOLN
INTRAVENOUS | Status: DC | PRN
  Administered 2017-03-05: 10:00:00 via INTRAVENOUS

## 2017-03-05 MED ORDER — MIDAZOLAM HCL 2 MG/2ML IJ SOLN
INTRAMUSCULAR | Status: AC
  Filled 2017-03-05: qty 2

## 2017-03-05 MED ORDER — SUCCINYLCHOLINE CHLORIDE 20 MG/ML IJ SOLN
INTRAMUSCULAR | Status: AC
  Filled 2017-03-05: qty 1

## 2017-03-05 MED ORDER — SUCCINYLCHOLINE CHLORIDE 20 MG/ML IJ SOLN
INTRAMUSCULAR | Status: DC | PRN
  Administered 2017-03-05: 100 mg via INTRAVENOUS

## 2017-03-05 NOTE — Anesthesia Postprocedure Evaluation (Signed)
Anesthesia Post Note  Patient: [name]  Procedure(s) Performed: * No procedures listed *  Patient location during evaluation: PACU Anesthesia Type: General Level of consciousness: awake and alert Pain management: pain level controlled Vital Signs Assessment: post-procedure vital signs reviewed and stable Respiratory status: spontaneous breathing, nonlabored ventilation, respiratory function stable and patient connected to nasal cannula oxygen Cardiovascular status: blood pressure returned to baseline and stable Postop Assessment: no signs of nausea or vomiting Anesthetic complications: no     Last Vitals:  Vitals:   03/05/17 1056 03/05/17 1109  BP: 124/81 133/86  Pulse: (!) 110 (!) 111  Resp: 14 16  Temp: 37.1 C     Last Pain:  Vitals:   03/05/17 1056  TempSrc:   PainSc: Asleep                 Delany Steury S

## 2017-03-05 NOTE — Discharge Instructions (Signed)
1)  The drugs that you have been given will stay in your system until tomorrow so for the       next 24 hours you should not:  A. Drive an automobile  B. Make any legal decisions  C. Drink any alcoholic beverages  2)  You may resume your regular meals upon return home.  3)  A responsible adult must take you home.  Someone should stay with you for a few          hours, then be available by phone for the remainder of the treatment day.  4)  You May experience any of the following symptoms:  Headache, Nausea and a dry mouth (due to the medications you were given),  temporary memory loss and some confusion, or sore muscles (a warm bath  should help this).  If you you experience any of these symptoms let us know on                your return visit.  5)  Report any of the following: any acute discomfort, severe headache, or temperature        greater than 100.5 F.   Also report any unusual redness, swelling, drainage, or pain         at your IV site.    You may report Symptoms to:  [facility] [place] [facility]          Phone: [phone], ECT Department           or Dr. Shary Keylapac's office [phone]  6)  Your next ECT Treatment is Day Wednesday Date August 22,, 2018 at 8am  We will call 2 days prior to your scheduled appointment for arrival times.  7)  Nothing to eat or drink after midnight the night before your procedure.  8)  Take all morning meds     With a sip of water the morning of your procedure.  9)  Other Instructions: Call [phone] to cancel the morning of your procedure due         to illness or emergency.  10) We will call within 72 hours to assess how you are feeling.

## 2017-03-05 NOTE — Anesthesia Preprocedure Evaluation (Signed)
Anesthesia Evaluation  Patient identified by MRN, date of birth, ID band Patient awake    Reviewed: Allergy & Precautions, NPO status , Patient's Chart, lab work & pertinent test results, reviewed documented beta blocker date and time   Airway Mallampati: III  TM Distance: >3 FB     Dental  (+) Chipped   Pulmonary former smoker,           Cardiovascular      Neuro/Psych    GI/Hepatic   Endo/Other    Renal/GU      Musculoskeletal   Abdominal   Peds  Hematology   Anesthesia Other Findings Obese.  Reproductive/Obstetrics                             Anesthesia Physical Anesthesia Plan  ASA: III  Anesthesia Plan: General   Post-op Pain Management:    Induction: Intravenous  PONV Risk Score and Plan:   Airway Management Planned:   Additional Equipment:   Intra-op Plan:   Post-operative Plan:   Informed Consent: I have reviewed the patients History and Physical, chart, labs and discussed the procedure including the risks, benefits and alternatives for the proposed anesthesia with the patient or authorized representative who has indicated his/her understanding and acceptance.     Plan Discussed with: CRNA  Anesthesia Plan Comments:         Anesthesia Quick Evaluation  

## 2017-03-05 NOTE — Procedures (Signed)
ECT SERVICES Physician's Interval Evaluation & Treatment Note  Patient Identification: Alma FriendlyDemetrio Fry MRN:  [id number] Date of Evaluation:  03/05/2017 TX #: 8  MADRS:   MMSE:   P.E. Findings:  Heart and lungs normal. Vitals unremarkable. No change to physical exam  Psychiatric Interval Note:  Flat tired blunt but no complaints pretty much stable no change  Subjective:  Patient is a 31 y.o. male seen for evaluation for Electroconvulsive Therapy. No complaints  Treatment Summary:   []   Right Unilateral             [x]  Bilateral   % Energy : 1.0 ms 100%   Impedance: 1490 ohms  Seizure Energy Index: No reading  Postictal Suppression Index: No reading  Seizure Concordance Index: 96%  Medications  Pre Shock: Robinul 0.2 mg Brevital 70 mg succinylcholine 100 mg  Post Shock: Versed 2 mg  Seizure Duration: 29 seconds by EMG, 29 seconds by EEG   Comments: Follow-up 3 weeks   Lungs:  [x]   Clear to auscultation               []  Other:   Heart:    [x]   Regular rhythm             []  irregular rhythm    [x]   Previous H&P reviewed, patient examined and there are NO CHANGES                 []   Previous H&P reviewed, patient examined and there are changes noted.   Mordecai RasmussenJohn Destaney Sarkis, MD 8/1/201810:12 AM

## 2017-03-05 NOTE — H&P (Signed)
Jonathan Alexander is an 31 y.o. male.   Chief Complaint: Asian has no specific complaint HPI: Long-standing schizoaffective disorder stabilized on combination of medicine and ECT currently receiving maintenance treatment  Past Medical History:  Diagnosis Date  . Schizophrenia (HCC)   . Tachycardia     History reviewed. No pertinent surgical history.  Family History  Problem Relation Age of Onset  . Family history unknown: Yes   Social History:  reports that he has quit smoking. His smoking use included Cigarettes. He quit after 4.00 years of use. He has never used smokeless tobacco. He reports that he does not drink alcohol or use drugs.  Allergies:  Allergies  Allergen Reactions  . Divalproex Sodium   . Shellfish-Derived Products      (Not in a hospital admission)  No results found for this or any previous visit (from the past 48 hour(s)). No results found.  Review of Systems  Constitutional: Negative.   HENT: Negative.   Eyes: Negative.   Respiratory: Negative.   Cardiovascular: Negative.   Gastrointestinal: Negative.   Musculoskeletal: Negative.   Skin: Negative.   Neurological: Negative.   Psychiatric/Behavioral: Negative.     Blood pressure (!) 139/91, pulse 99, temperature (!) 97.4 F (36.3 C), resp. rate 18, height 5\' 7"  (1.702 m), weight 225 lb (102.1 kg), SpO2 100 %. Physical Exam  Nursing note and vitals reviewed. Constitutional: He appears well-developed and well-nourished.  HENT:  Head: Normocephalic and atraumatic.  Eyes: Pupils are equal, round, and reactive to light. Conjunctivae are normal.  Neck: Normal range of motion.  Cardiovascular: Regular rhythm and normal heart sounds.   Respiratory: Effort normal.  GI: Soft.  Musculoskeletal: Normal range of motion.  Neurological: He is alert.  Skin: Skin is warm and dry.  Psychiatric: Judgment normal. His affect is blunt. His speech is delayed. He is slowed. Thought content is not paranoid. Cognition  and memory are impaired. He expresses no homicidal and no suicidal ideation.     Assessment/Plan Treatment today follow-up in 3 weeks because of scheduling conflicts otherwise anticipate continuing a 2 to three-week maintenance schedule  Mordecai RasmussenJohn Patirica Longshore, MD 03/05/2017, 10:11 AM

## 2017-03-05 NOTE — Transfer of Care (Signed)
Immediate Anesthesia Transfer of Care Note  Patient: Jonathan Alexander  Procedure(s) Performed: ect   Patient Location: PACU  Anesthesia Type:General  Level of Consciousness: awake  Airway & Oxygen Therapy: Patient Spontanous Breathing and Patient connected to face mask oxygen  Post-op Assessment: Report given to RN and Post -op Vital signs reviewed and stable  Post vital signs: Reviewed  Last Vitals:  Vitals:   03/05/17 0950 03/05/17 1026  BP: (!) 139/91 115/73  Pulse: 99 (!) 110  Resp: 18 13  Temp:  36.6 C    Last Pain:  Vitals:   03/05/17 0950  TempSrc: Oral         Complications: No apparent anesthesia complications

## 2017-03-05 NOTE — Progress Notes (Signed)
Patient arouses when spoken too. Will not answer any questions. VSS. Will transfer to phase 2 recovery in recliner

## 2017-03-05 NOTE — Anesthesia Post-op Follow-up Note (Cosign Needed)
Anesthesia QCDR form completed.        

## 2017-03-07 DIAGNOSIS — L272 Dermatitis due to ingested food: Secondary | ICD-10-CM | POA: Diagnosis not present

## 2017-03-07 DIAGNOSIS — R000 Tachycardia, unspecified: Secondary | ICD-10-CM | POA: Diagnosis not present

## 2017-03-07 DIAGNOSIS — F259 Schizoaffective disorder, unspecified: Secondary | ICD-10-CM | POA: Diagnosis not present

## 2017-03-07 DIAGNOSIS — E669 Obesity, unspecified: Secondary | ICD-10-CM | POA: Diagnosis not present

## 2017-03-21 DIAGNOSIS — E669 Obesity, unspecified: Secondary | ICD-10-CM | POA: Diagnosis not present

## 2017-03-24 ENCOUNTER — Telehealth: Payer: Self-pay | Admitting: *Deleted

## 2017-03-25 ENCOUNTER — Other Ambulatory Visit: Payer: Self-pay | Admitting: Psychiatry

## 2017-03-25 DIAGNOSIS — Z79899 Other long term (current) drug therapy: Secondary | ICD-10-CM | POA: Diagnosis not present

## 2017-03-26 ENCOUNTER — Encounter: Payer: Self-pay | Admitting: Anesthesiology

## 2017-03-26 ENCOUNTER — Ambulatory Visit
Admission: RE | Admit: 2017-03-26 | Discharge: 2017-03-26 | Disposition: A | Discharge status: Home / Self Care | Payer: Medicare Other | Source: Ambulatory Visit | Attending: Psychiatry | Admitting: Psychiatry

## 2017-03-26 DIAGNOSIS — Z87891 Personal history of nicotine dependence: Secondary | ICD-10-CM | POA: Diagnosis not present

## 2017-03-26 DIAGNOSIS — F259 Schizoaffective disorder, unspecified: Secondary | ICD-10-CM | POA: Diagnosis not present

## 2017-03-26 DIAGNOSIS — F2089 Other schizophrenia: Secondary | ICD-10-CM | POA: Diagnosis not present

## 2017-03-26 DIAGNOSIS — F250 Schizoaffective disorder, bipolar type: Secondary | ICD-10-CM | POA: Diagnosis not present

## 2017-03-26 MED ORDER — GLYCOPYRROLATE 0.2 MG/ML IJ SOLN
0.2000 mg | Freq: Once | INTRAMUSCULAR | Status: AC
  Administered 2017-03-26: 0.2 mg via INTRAVENOUS

## 2017-03-26 MED ORDER — MIDAZOLAM HCL 2 MG/2ML IJ SOLN
INTRAMUSCULAR | Status: DC | PRN
  Administered 2017-03-26: 1 mg via INTRAVENOUS

## 2017-03-26 MED ORDER — MIDAZOLAM HCL 2 MG/2ML IJ SOLN
INTRAMUSCULAR | Status: AC
  Filled 2017-03-26: qty 2

## 2017-03-26 MED ORDER — SODIUM CHLORIDE 0.9 % IV SOLN
500.0000 mL | Freq: Once | INTRAVENOUS | Status: AC
  Administered 2017-03-26: 1000 mL via INTRAVENOUS

## 2017-03-26 MED ORDER — SUCCINYLCHOLINE CHLORIDE 20 MG/ML IJ SOLN
INTRAMUSCULAR | Status: AC
  Filled 2017-03-26: qty 1

## 2017-03-26 MED ORDER — METHOHEXITAL SODIUM 100 MG/10ML IV SOSY
PREFILLED_SYRINGE | INTRAVENOUS | Status: DC | PRN
  Administered 2017-03-26: 70 mg via INTRAVENOUS

## 2017-03-26 MED ORDER — SUCCINYLCHOLINE CHLORIDE 200 MG/10ML IV SOSY
PREFILLED_SYRINGE | INTRAVENOUS | Status: DC | PRN
  Administered 2017-03-26: 100 mg via INTRAVENOUS

## 2017-03-26 MED ORDER — GLYCOPYRROLATE 0.2 MG/ML IJ SOLN
INTRAMUSCULAR | Status: AC
  Filled 2017-03-26: qty 1

## 2017-03-26 MED ORDER — SODIUM CHLORIDE 0.9 % IV SOLN
INTRAVENOUS | Status: DC | PRN
  Administered 2017-03-26 (×2): via INTRAVENOUS

## 2017-03-26 NOTE — H&P (Signed)
Jonathan Alexander is an 31 y.o. male.   Chief Complaint: Asian has no new complaints today. No physical complaints mood stable. HPI: History of schizoaffective disorder with extended periods of disability currently maintained outside the hospital on a combination medicine and ECT  Past Medical History:  Diagnosis Date  . Schizophrenia (HCC)   . Tachycardia     History reviewed. No pertinent surgical history.  Family History  Problem Relation Age of Onset  . Family history unknown: Yes   Social History:  reports that he has quit smoking. His smoking use included Cigarettes. He quit after 4.00 years of use. He has never used smokeless tobacco. He reports that he does not drink alcohol or use drugs.  Allergies:  Allergies  Allergen Reactions  . Divalproex Sodium   . Shellfish-Derived Products      (Not in a hospital admission)  No results found for this or any previous visit (from the past 48 hour(s)). No results found.  Review of Systems  Constitutional: Negative.   HENT: Negative.   Eyes: Negative.   Respiratory: Negative.   Cardiovascular: Negative.   Gastrointestinal: Negative.   Musculoskeletal: Negative.   Skin: Negative.   Neurological: Negative.   Psychiatric/Behavioral: Negative.     Blood pressure 131/77, pulse 94, temperature (!) 97.1 F (36.2 C), temperature source Oral, resp. rate 19, height 5\' 7"  (1.702 m), weight 101.6 kg (224 lb), SpO2 100 %. Physical Exam  Nursing note and vitals reviewed. Constitutional: He appears well-developed and well-nourished.  HENT:  Head: Normocephalic and atraumatic.  Eyes: Pupils are equal, round, and reactive to light. Conjunctivae are normal.  Neck: Normal range of motion.  Cardiovascular: Regular rhythm and normal heart sounds.   Respiratory: Effort normal and breath sounds normal.  GI: Soft.  Musculoskeletal: Normal range of motion.  Neurological: He is alert.  Skin: Skin is warm and dry.  Psychiatric: Judgment  normal. His affect is blunt. His speech is delayed. He is slowed and withdrawn. Thought content is not paranoid. Cognition and memory are impaired. He expresses no homicidal and no suicidal ideation.     Assessment/Plan No change to treatment plan follow-up on the usual every other week schedule patient has no complaint. Tolerates treatment well.  [name], MD 03/26/2017, 10:13 AM

## 2017-03-26 NOTE — Discharge Instructions (Signed)
1)  The drugs that you have been given will stay in your system until tomorrow so for the       next 24 hours you should not:  A. Drive an automobile  B. Make any legal decisions  C. Drink any alcoholic beverages  2)  You may resume your regular meals upon return home.  3)  A responsible adult must take you home.  Someone should stay with you for a few          hours, then be available by phone for the remainder of the treatment day.  4)  You May experience any of the following symptoms:  Headache, Nausea and a dry mouth (due to the medications you were given),  temporary memory loss and some confusion, or sore muscles (a warm bath  should help this).  If you you experience any of these symptoms let us know on                your return visit.  5)  Report any of the following: any acute discomfort, severe headache, or temperature        greater than 100.5 F.   Also report any unusual redness, swelling, drainage, or pain         at your IV site.    You may report Symptoms to:  ECT PROGRAM- [place] [facility]          Phone: [phone], ECT Department           or Dr. Shary Key office [phone]  6)  Your next ECT Treatment is Day   Wednesday  Date   April 09, 2017  We will call 2 days prior to your scheduled appointment for arrival times.  7)  Nothing to eat or drink after midnight the night before your procedure.  8)  Take morning medications     With a sip of water the morning of your procedure.  9)  Other Instructions: Call [phone] to cancel the morning of your procedure due         to illness or emergency.  10) We will call within 72 hours to assess how you are feeling.

## 2017-03-26 NOTE — Procedures (Signed)
ECT SERVICES Physician's Interval Evaluation & Treatment Note  Patient Identification: Jonathan Alexander MRN:  [id number] Date of Evaluation:  03/26/2017 TX #: 9  MADRS:   MMSE:   P.E. Findings:  No change to physical exam. Vitals normal. Heart and lungs normal.  Psychiatric Interval Note:  Withdrawn and blunted not reporting acute psychosis not agitated appears to be stable  Subjective:  Patient is a 31 y.o. male seen for evaluation for Electroconvulsive Therapy. No new complaints today  Treatment Summary:   []   Right Unilateral             [x]  Bilateral   % Energy : 1.0 ms 100%   Impedance: 1470 ohms  Seizure Energy Index: 13,411 V squared  Postictal Suppression Index: 93%  Seizure Concordance Index: 90%  Medications  Pre Shock: Robinul 0.2 mg Brevital 70 mg succinylcholine 100 mg  Post Shock: Versed 2 mg  Seizure Duration: EMG 31 seconds EEG 33 seconds   Comments: Follow-up 2 weeks   Lungs:  [x]   Clear to auscultation               []  Other:   Heart:    [x]   Regular rhythm             []  irregular rhythm    [x]   Previous H&P reviewed, patient examined and there are NO CHANGES                 []   Previous H&P reviewed, patient examined and there are changes noted.   [name], MD 8/22/201810:55 AM

## 2017-03-26 NOTE — Anesthesia Postprocedure Evaluation (Signed)
Anesthesia Post Note  Patient: Estate agent  Procedure(s) Performed: * No procedures listed *  Patient location during evaluation: PACU Anesthesia Type: General Level of consciousness: awake and alert and oriented Pain management: pain level controlled Vital Signs Assessment: post-procedure vital signs reviewed and stable Respiratory status: spontaneous breathing, nonlabored ventilation and respiratory function stable Cardiovascular status: blood pressure returned to baseline and stable Postop Assessment: no signs of nausea or vomiting Anesthetic complications: no     Last Vitals:  Vitals:   03/26/17 1130 03/26/17 1142  BP: 126/80 115/74  Pulse: 95 95  Resp: 13 16  Temp:    SpO2: 98%     Last Pain:  Vitals:   03/26/17 1142  TempSrc:   PainSc: Asleep                 Kalsey Lull

## 2017-03-26 NOTE — Anesthesia Preprocedure Evaluation (Signed)
Anesthesia Evaluation  Patient identified by MRN, date of birth, ID band Patient awake    Reviewed: Allergy & Precautions, H&P , NPO status , Patient's Chart, lab work & pertinent test results, reviewed documented beta blocker date and time   History of Anesthesia Complications Negative for: history of anesthetic complications  Airway Mallampati: II  TM Distance: >3 FB Neck ROM: Full    Dental  (+) Teeth Intact   Pulmonary neg sleep apnea, neg COPD, former smoker,    Pulmonary exam normal breath sounds clear to auscultation- rhonchi (-) wheezing      Cardiovascular Exercise Tolerance: Good (-) hypertension(-) CAD and (-) Past MI negative cardio ROS Normal cardiovascular exam Rhythm:regular Rate:Normal - Systolic murmurs and - Diastolic murmurs    Neuro/Psych PSYCHIATRIC DISORDERS Schizophrenia negative neurological ROS     GI/Hepatic negative GI ROS, Neg liver ROS,   Endo/Other  negative endocrine ROSneg diabetes  Renal/GU negative Renal ROS  negative genitourinary   Musculoskeletal   Abdominal (+) - obese,   Peds  Hematology negative hematology ROS (+)   Anesthesia Other Findings Past Medical History: No date: Schizophrenia (HCC) No date: Tachycardia History reviewed. No pertinent surgical history. BMI    Body Mass Index:  35.40 kg/m     Reproductive/Obstetrics negative OB ROS                             Anesthesia Physical  Anesthesia Plan  ASA: II  Anesthesia Plan: General   Post-op Pain Management:    Induction: Intravenous  PONV Risk Score and Plan: 1 and Ondansetron  Airway Management Planned: Mask  Additional Equipment:   Intra-op Plan:   Post-operative Plan:   Informed Consent: I have reviewed the patients History and Physical, chart, labs and discussed the procedure including the risks, benefits and alternatives for the proposed anesthesia with the patient  or authorized representative who has indicated his/her understanding and acceptance.   Dental Advisory Given  Plan Discussed with: CRNA and Anesthesiologist  Anesthesia Plan Comments:         Anesthesia Quick Evaluation  

## 2017-03-26 NOTE — Anesthesia Post-op Follow-up Note (Signed)
Anesthesia QCDR form completed.        

## 2017-03-26 NOTE — Anesthesia Procedure Notes (Signed)
Date/Time: 03/26/2017 11:02 AM Performed by: Lily Kocher Pre-anesthesia Checklist: Patient identified, Emergency Drugs available, Suction available and Patient being monitored Patient Re-evaluated:Patient Re-evaluated prior to induction Oxygen Delivery Method: Circle system utilized Preoxygenation: Pre-oxygenation with 100% oxygen Induction Type: IV induction Ventilation: Mask ventilation without difficulty and Mask ventilation throughout procedure Airway Equipment and Method: Bite block Placement Confirmation: positive ETCO2 Dental Injury: Teeth and Oropharynx as per pre-operative assessment

## 2017-03-26 NOTE — Transfer of Care (Signed)
Immediate Anesthesia Transfer of Care Note  Patient: Jonathan Alexander  Procedure(s) Performed: ECT  Patient Location: PACU  Anesthesia Type:General  Level of Consciousness: sedated  Airway & Oxygen Therapy: Patient Spontanous Breathing  Post-op Assessment: Report given to RN and Post -op Vital signs reviewed and stable  Post vital signs: Reviewed and stable  Last Vitals:  Vitals:   03/26/17 0931 03/26/17 1110  BP: 131/77 129/86  Pulse: 94 96  Resp: 19 16  Temp: (!) 36.2 C 36.6 C  SpO2: 100% 97%    Last Pain:  Vitals:   03/26/17 1110  TempSrc:   PainSc: Asleep         Complications: No apparent anesthesia complications

## 2017-03-31 DIAGNOSIS — D649 Anemia, unspecified: Secondary | ICD-10-CM | POA: Diagnosis not present

## 2017-03-31 DIAGNOSIS — L272 Dermatitis due to ingested food: Secondary | ICD-10-CM | POA: Diagnosis not present

## 2017-03-31 DIAGNOSIS — F259 Schizoaffective disorder, unspecified: Secondary | ICD-10-CM | POA: Diagnosis not present

## 2017-03-31 DIAGNOSIS — E669 Obesity, unspecified: Secondary | ICD-10-CM | POA: Diagnosis not present

## 2017-03-31 DIAGNOSIS — R748 Abnormal levels of other serum enzymes: Secondary | ICD-10-CM | POA: Diagnosis not present

## 2017-03-31 DIAGNOSIS — R000 Tachycardia, unspecified: Secondary | ICD-10-CM | POA: Diagnosis not present

## 2017-04-01 ENCOUNTER — Other Ambulatory Visit: Payer: Self-pay | Admitting: Internal Medicine

## 2017-04-01 DIAGNOSIS — R7989 Other specified abnormal findings of blood chemistry: Secondary | ICD-10-CM

## 2017-04-01 DIAGNOSIS — R945 Abnormal results of liver function studies: Secondary | ICD-10-CM

## 2017-04-04 ENCOUNTER — Telehealth: Payer: Self-pay | Admitting: *Deleted

## 2017-04-04 ENCOUNTER — Ambulatory Visit
Admission: RE | Admit: 2017-04-04 | Discharge: 2017-04-04 | Disposition: A | Discharge status: Home / Self Care | Payer: Medicare Other | Source: Ambulatory Visit | Attending: Internal Medicine | Admitting: Internal Medicine

## 2017-04-04 DIAGNOSIS — R945 Abnormal results of liver function studies: Secondary | ICD-10-CM | POA: Diagnosis not present

## 2017-04-04 DIAGNOSIS — R7989 Other specified abnormal findings of blood chemistry: Secondary | ICD-10-CM

## 2017-04-08 ENCOUNTER — Other Ambulatory Visit: Payer: Self-pay | Admitting: Psychiatry

## 2017-04-09 ENCOUNTER — Encounter: Payer: Self-pay | Admitting: Anesthesiology

## 2017-04-09 ENCOUNTER — Encounter
Admission: RE | Admit: 2017-04-09 | Discharge: 2017-04-09 | Disposition: A | Discharge status: Home / Self Care | Payer: Medicare Other | Source: Ambulatory Visit | Attending: Psychiatry | Admitting: Psychiatry

## 2017-04-09 DIAGNOSIS — F259 Schizoaffective disorder, unspecified: Secondary | ICD-10-CM | POA: Diagnosis not present

## 2017-04-09 DIAGNOSIS — F250 Schizoaffective disorder, bipolar type: Secondary | ICD-10-CM | POA: Diagnosis not present

## 2017-04-09 DIAGNOSIS — Z87891 Personal history of nicotine dependence: Secondary | ICD-10-CM | POA: Insufficient documentation

## 2017-04-09 DIAGNOSIS — F2089 Other schizophrenia: Secondary | ICD-10-CM | POA: Diagnosis not present

## 2017-04-09 MED ORDER — MIDAZOLAM HCL 2 MG/2ML IJ SOLN
2.0000 mg | Freq: Once | INTRAMUSCULAR | Status: AC
  Administered 2017-04-09: 1 mg via INTRAVENOUS

## 2017-04-09 MED ORDER — SUCCINYLCHOLINE CHLORIDE 200 MG/10ML IV SOSY
PREFILLED_SYRINGE | INTRAVENOUS | Status: DC | PRN
Start: 2017-04-09 — End: 2017-04-09
  Administered 2017-04-09: 100 mg via INTRAVENOUS

## 2017-04-09 MED ORDER — ONDANSETRON HCL 4 MG/2ML IJ SOLN: 4.0000 mg | Freq: Once | INTRAMUSCULAR | Status: DC | PRN

## 2017-04-09 MED ORDER — GLYCOPYRROLATE 0.2 MG/ML IJ SOLN
0.2000 mg | Freq: Once | INTRAMUSCULAR | Status: AC
  Administered 2017-04-09: 0.2 mg via INTRAVENOUS

## 2017-04-09 MED ORDER — FENTANYL CITRATE (PF) 100 MCG/2ML IJ SOLN: 25.0000 ug | INTRAMUSCULAR | Status: DC | PRN

## 2017-04-09 MED ORDER — SODIUM CHLORIDE 0.9 % IV SOLN
500.0000 mL | Freq: Once | INTRAVENOUS | Status: AC
  Administered 2017-04-09: 10:00:00 via INTRAVENOUS

## 2017-04-09 MED ORDER — MIDAZOLAM HCL 2 MG/2ML IJ SOLN
INTRAMUSCULAR | Status: AC
  Filled 2017-04-09: qty 2

## 2017-04-09 MED ORDER — METHOHEXITAL SODIUM 100 MG/10ML IV SOSY
PREFILLED_SYRINGE | INTRAVENOUS | Status: DC | PRN
  Administered 2017-04-09: 70 mg via INTRAVENOUS

## 2017-04-09 MED ORDER — GLYCOPYRROLATE 0.2 MG/ML IJ SOLN
INTRAMUSCULAR | Status: AC
  Filled 2017-04-09: qty 1

## 2017-04-09 MED ORDER — METHOHEXITAL SODIUM 0.5 G IJ SOLR
INTRAMUSCULAR | Status: AC
  Filled 2017-04-09: qty 500

## 2017-04-09 NOTE — H&P (Signed)
Jonathan Alexander is an 31 y.o. male.   Chief Complaint: Patient has no new complaint. Mood stable. No suicidal thoughts. HPI: History of recurrent schizoaffective disorder.  Past Medical History:  Diagnosis Date  . Schizophrenia (HCC)   . Tachycardia     History reviewed. No pertinent surgical history.  Family History  Problem Relation Age of Onset  . Family history unknown: Yes   Social History:  reports that he has quit smoking. His smoking use included Cigarettes. He quit after 4.00 years of use. He has never used smokeless tobacco. He reports that he does not drink alcohol or use drugs.  Allergies:  Allergies  Allergen Reactions  . Divalproex Sodium   . Shellfish-Derived Products      (Not in a hospital admission)  No results found for this or any previous visit (from the past 48 hour(s)). No results found.  Review of Systems  Constitutional: Negative.   HENT: Negative.   Eyes: Negative.   Respiratory: Negative.   Cardiovascular: Negative.   Gastrointestinal: Negative.   Musculoskeletal: Negative.   Skin: Negative.   Neurological: Negative.   Psychiatric/Behavioral: Negative for depression, hallucinations, memory loss, substance abuse and suicidal ideas. The patient is not nervous/anxious and does not have insomnia.     Blood pressure 123/78, pulse 95, temperature (!) 97.2 F (36.2 C), temperature source Oral, height 5\' 7"  (1.702 m), weight 226 lb (102.5 kg), SpO2 100 %. Physical Exam  Nursing note and vitals reviewed. Constitutional: He appears well-developed and well-nourished.  HENT:  Head: Normocephalic and atraumatic.  Eyes: Pupils are equal, round, and reactive to light. Conjunctivae are normal.  Neck: Normal range of motion.  Cardiovascular: Regular rhythm and normal heart sounds.   Respiratory: Effort normal. No respiratory distress.  GI: Soft.  Musculoskeletal: Normal range of motion.  Neurological: He is alert.  Skin: Skin is warm and dry.   Psychiatric: Judgment and thought content normal. His affect is blunt. His speech is delayed. He is slowed and withdrawn. Cognition and memory are impaired.     Assessment/Plan Treatment today in follow-up in 2 weeks  Mordecai RasmussenJohn Solei Wubben, MD 04/09/2017, 10:16 AM

## 2017-04-09 NOTE — Discharge Instructions (Signed)
1)  The drugs that you have been given will stay in your system until tomorrow so for the       next 24 hours you should not:  A. Drive an automobile  B. Make any legal decisions  C. Drink any alcoholic beverages  2)  You may resume your regular meals upon return home.  3)  A responsible adult must take you home.  Someone should stay with you for a few          hours, then be available by phone for the remainder of the treatment day.  4)  You May experience any of the following symptoms:  Headache, Nausea and a dry mouth (due to the medications you were given),  temporary memory loss and some confusion, or sore muscles (a warm bath  should help this).  If you you experience any of these symptoms let us know on                your return visit.  5)  Report any of the following: any acute discomfort, severe headache, or temperature        greater than 100.5 F.   Also report any unusual redness, swelling, drainage, or pain         at your IV site.    You may report Symptoms to:  [facility] [place] [facility]          Phone: [phone], ECT Department           or Dr. Shary Keylapac's office [phone]  6)  Your next ECT Treatment is Day Wednesday  Date September 19,2018  We will call 2 days prior to your scheduled appointment for arrival times.  7)  Nothing to eat or drink after midnight the night before your procedure.  8)  Take all morning meds  With a sip of water the morning of your procedure.  9)  Other Instructions: Call [phone] to cancel the morning of your procedure due         to illness or emergency.  10) We will call within 72 hours to assess how you are feeling.

## 2017-04-09 NOTE — Anesthesia Procedure Notes (Signed)
Date/Time: 04/09/2017 10:22 AM Performed by: Lily KocherPERALTA, Jonathan Mattox Pre-anesthesia Checklist: Patient identified, Emergency Drugs available, Suction available and Patient being monitored Patient Re-evaluated:Patient Re-evaluated prior to induction Oxygen Delivery Method: Circle system utilized Preoxygenation: Pre-oxygenation with 100% oxygen Induction Type: IV induction Ventilation: Mask ventilation without difficulty and Mask ventilation throughout procedure Airway Equipment and Method: Bite block Placement Confirmation: positive ETCO2 Dental Injury: Teeth and Oropharynx as per pre-operative assessment

## 2017-04-09 NOTE — Transfer of Care (Signed)
Immediate Anesthesia Transfer of Care Note  Patient: Jonathan Alexander  Procedure(s) Performed: ECT  Patient Location: PACU  Anesthesia Type:General  Level of Consciousness: sedated  Airway & Oxygen Therapy: Patient Spontanous Breathing  Post-op Assessment: Report given to RN and Post -op Vital signs reviewed and stable  Post vital signs: Reviewed and stable  Last Vitals:  Vitals:   04/09/17 0938 04/09/17 1030  BP: 123/78 129/77  Pulse: 95 (!) 102  Resp:  16  Temp: (!) 36.2 C 36.7 C  SpO2: 100% 97%    Last Pain:  Vitals:   04/09/17 0938  TempSrc: Oral         Complications: No apparent anesthesia complications

## 2017-04-09 NOTE — Anesthesia Post-op Follow-up Note (Signed)
Anesthesia QCDR form completed.        

## 2017-04-09 NOTE — Procedures (Signed)
ECT SERVICES Physician's Interval Evaluation & Treatment Note  Patient Identification: Alma FriendlyDemetrio Kuchera MRN:  [id number] Date of Evaluation:  04/09/2017 TX #: 9  MADRS:   MMSE:   P.E. Findings:  No change to physical exam. Heart and lungs normal.  Psychiatric Interval Note:  Patient's affect is blunt and he is withdrawn. Denies suicidal thoughts. No behavior problems.  Subjective:  Patient is a 31 y.o. male seen for evaluation for Electroconvulsive Therapy. No specific new complaint  Treatment Summary:   []   Right Unilateral             [x]  Bilateral   % Energy : 1.0 ms 100%   Impedance: 730 ohms  Seizure Energy Index: No reading obtain seizure looked very strong  Postictal Suppression Index: No reading  Seizure Concordance Index: No reading  Medications  Pre Shock: Robinul 0.2 mg Brevital 70 mg succinylcholine 100 mg  Post Shock: Versed 2 mg  Seizure Duration: 31 seconds by EMG 31 seconds by EEG   Comments: Follow-up in 2 weeks September 19   Lungs:  [x]   Clear to auscultation               []  Other:   Heart:    [x]   Regular rhythm             []  irregular rhythm    [x]   Previous H&P reviewed, patient examined and there are NO CHANGES                 []   Previous H&P reviewed, patient examined and there are changes noted.   [name], MD 9/5/201810:18 AM

## 2017-04-09 NOTE — Anesthesia Preprocedure Evaluation (Signed)
Anesthesia Evaluation  Patient identified by MRN, date of birth, ID band Patient awake    Reviewed: Allergy & Precautions, H&P , NPO status , Patient's Chart, lab work & pertinent test results, reviewed documented beta blocker date and time   History of Anesthesia Complications Negative for: history of anesthetic complications  Airway Mallampati: II  TM Distance: >3 FB Neck ROM: Full    Dental  (+) Teeth Intact   Pulmonary neg sleep apnea, neg COPD, former smoker,    Pulmonary exam normal breath sounds clear to auscultation- rhonchi (-) wheezing      Cardiovascular Exercise Tolerance: Good (-) hypertension(-) CAD and (-) Past MI negative cardio ROS Normal cardiovascular exam Rhythm:regular Rate:Normal - Systolic murmurs and - Diastolic murmurs    Neuro/Psych PSYCHIATRIC DISORDERS Schizophrenia negative neurological ROS     GI/Hepatic negative GI ROS, Neg liver ROS,   Endo/Other  negative endocrine ROSneg diabetes  Renal/GU negative Renal ROS  negative genitourinary   Musculoskeletal   Abdominal (+) - obese,   Peds  Hematology negative hematology ROS (+)   Anesthesia Other Findings Past Medical History: No date: Schizophrenia (HCC) No date: Tachycardia History reviewed. No pertinent surgical history. BMI    Body Mass Index:  35.40 kg/m     Reproductive/Obstetrics negative OB ROS                             Anesthesia Physical  Anesthesia Plan  ASA: II  Anesthesia Plan: General   Post-op Pain Management:    Induction: Intravenous  PONV Risk Score and Plan: 1 and Ondansetron  Airway Management Planned: Mask  Additional Equipment:   Intra-op Plan:   Post-operative Plan:   Informed Consent: I have reviewed the patients History and Physical, chart, labs and discussed the procedure including the risks, benefits and alternatives for the proposed anesthesia with the patient  or authorized representative who has indicated his/her understanding and acceptance.   Dental Advisory Given  Plan Discussed with: CRNA and Anesthesiologist  Anesthesia Plan Comments:         Anesthesia Quick Evaluation

## 2017-04-09 NOTE — Anesthesia Postprocedure Evaluation (Signed)
Anesthesia Post Note  Patient: Jonathan Alexander  Procedure(s) Performed: * No procedures listed *  Patient location during evaluation: PACU Anesthesia Type: General Level of consciousness: awake and alert and oriented Pain management: pain level controlled Vital Signs Assessment: post-procedure vital signs reviewed and stable Respiratory status: spontaneous breathing Cardiovascular status: blood pressure returned to baseline Anesthetic complications: no     Last Vitals:  Vitals:   04/09/17 1121 04/09/17 1122  BP:  122/80  Pulse: 93 98  Resp: 12 15  Temp:    SpO2: 100% 100%    Last Pain:  Vitals:   04/09/17 1100  TempSrc:   PainSc: 0-No pain                 Terianna Peggs

## 2017-04-14 DIAGNOSIS — F259 Schizoaffective disorder, unspecified: Secondary | ICD-10-CM | POA: Diagnosis not present

## 2017-04-14 DIAGNOSIS — R000 Tachycardia, unspecified: Secondary | ICD-10-CM | POA: Diagnosis not present

## 2017-04-14 DIAGNOSIS — R748 Abnormal levels of other serum enzymes: Secondary | ICD-10-CM | POA: Diagnosis not present

## 2017-04-14 DIAGNOSIS — E669 Obesity, unspecified: Secondary | ICD-10-CM | POA: Diagnosis not present

## 2017-04-14 DIAGNOSIS — D649 Anemia, unspecified: Secondary | ICD-10-CM | POA: Diagnosis not present

## 2017-04-14 DIAGNOSIS — L272 Dermatitis due to ingested food: Secondary | ICD-10-CM | POA: Diagnosis not present

## 2017-04-21 ENCOUNTER — Telehealth: Payer: Self-pay | Admitting: *Deleted

## 2017-04-22 ENCOUNTER — Other Ambulatory Visit: Payer: Self-pay | Admitting: Psychiatry

## 2017-04-23 ENCOUNTER — Encounter (HOSPITAL_BASED_OUTPATIENT_CLINIC_OR_DEPARTMENT_OTHER)
Admission: RE | Admit: 2017-04-23 | Discharge: 2017-04-23 | Disposition: A | Discharge status: Home / Self Care | Payer: Medicare Other | Source: Ambulatory Visit | Attending: Psychiatry | Admitting: Psychiatry

## 2017-04-23 ENCOUNTER — Encounter: Payer: Self-pay | Admitting: Anesthesiology

## 2017-04-23 DIAGNOSIS — F250 Schizoaffective disorder, bipolar type: Secondary | ICD-10-CM

## 2017-04-23 DIAGNOSIS — Z87891 Personal history of nicotine dependence: Secondary | ICD-10-CM | POA: Diagnosis not present

## 2017-04-23 DIAGNOSIS — F259 Schizoaffective disorder, unspecified: Secondary | ICD-10-CM | POA: Diagnosis not present

## 2017-04-23 DIAGNOSIS — F2089 Other schizophrenia: Secondary | ICD-10-CM | POA: Diagnosis not present

## 2017-04-23 MED ORDER — GLYCOPYRROLATE 0.2 MG/ML IJ SOLN
INTRAMUSCULAR | Status: AC
  Administered 2017-04-23: 0.2 mg via INTRAVENOUS
  Filled 2017-04-23: qty 1

## 2017-04-23 MED ORDER — MIDAZOLAM HCL 2 MG/2ML IJ SOLN
INTRAMUSCULAR | Status: DC | PRN
  Administered 2017-04-23: 1 mg via INTRAVENOUS

## 2017-04-23 MED ORDER — MIDAZOLAM HCL 2 MG/2ML IJ SOLN
INTRAMUSCULAR | Status: AC
  Filled 2017-04-23: qty 2

## 2017-04-23 MED ORDER — SODIUM CHLORIDE 0.9 % IV SOLN
INTRAVENOUS | Status: DC | PRN
  Administered 2017-04-23: 11:00:00 via INTRAVENOUS

## 2017-04-23 MED ORDER — FENTANYL CITRATE (PF) 100 MCG/2ML IJ SOLN: 25.0000 ug | INTRAMUSCULAR | Status: DC | PRN

## 2017-04-23 MED ORDER — SUCCINYLCHOLINE CHLORIDE 20 MG/ML IJ SOLN
INTRAMUSCULAR | Status: AC
  Filled 2017-04-23: qty 1

## 2017-04-23 MED ORDER — SODIUM CHLORIDE 0.9 % IV SOLN
500.0000 mL | Freq: Once | INTRAVENOUS | Status: AC
  Administered 2017-04-23: 500 mL via INTRAVENOUS

## 2017-04-23 MED ORDER — ONDANSETRON HCL 4 MG/2ML IJ SOLN: 4.0000 mg | Freq: Once | INTRAMUSCULAR | Status: DC | PRN

## 2017-04-23 MED ORDER — MIDAZOLAM HCL 2 MG/2ML IJ SOLN: 2.0000 mg | Freq: Once | INTRAMUSCULAR | Status: DC

## 2017-04-23 MED ORDER — GLYCOPYRROLATE 0.2 MG/ML IJ SOLN
0.2000 mg | Freq: Once | INTRAMUSCULAR | Status: AC
  Administered 2017-04-23: 0.2 mg via INTRAVENOUS

## 2017-04-23 MED ORDER — SUCCINYLCHOLINE CHLORIDE 200 MG/10ML IV SOSY
PREFILLED_SYRINGE | INTRAVENOUS | Status: DC | PRN
  Administered 2017-04-23: 100 mg via INTRAVENOUS

## 2017-04-23 MED ORDER — METHOHEXITAL SODIUM 100 MG/10ML IV SOSY
PREFILLED_SYRINGE | INTRAVENOUS | Status: DC | PRN
  Administered 2017-04-23: 70 mg via INTRAVENOUS

## 2017-04-23 NOTE — Discharge Instructions (Signed)
1)  The drugs that you have been given will stay in your system until tomorrow so for the       next 24 hours you should not:  A. Drive an automobile  B. Make any legal decisions  C. Drink any alcoholic beverages  2)  You may resume your regular meals upon return home.  3)  A responsible adult must take you home.  Someone should stay with you for a few          hours, then be available by phone for the remainder of the treatment day.  4)  You May experience any of the following symptoms:  Headache, Nausea and a dry mouth (due to the medications you were given),  temporary memory loss and some confusion, or sore muscles (a warm bath  should help this).  If you you experience any of these symptoms let us know on                your return visit.  5)  Report any of the following: any acute discomfort, severe headache, or temperature        greater than 100.5 F.   Also report any unusual redness, swelling, drainage, or pain         at your IV site.    You may report Symptoms to:  ECT PROGRAM- [place] [facility]          Phone: [phone], ECT Department           or Dr. Shary Key office [phone]  6)  Your next ECT Treatment is Day Wednesday  Date May 14, 2017  We will call 2 days prior to your scheduled appointment for arrival times.  7)  Nothing to eat or drink after midnight the night before your procedure.  8)  Take all morning meds    With a sip of water the morning of your procedure.  9)  Other Instructions: Call [phone] to cancel the morning of your procedure due         to illness or emergency.  10) We will call within 72 hours to assess how you are feeling.

## 2017-04-23 NOTE — H&P (Signed)
Jonathan Alexander is an 31 y.o. male.   Chief Complaint: Patient has no complaint. HPI: History of schizoaffective disorder with recurrent severe episodes that responded to ECT under controlled currently with medication and maintenance ECT treatment  Past Medical History:  Diagnosis Date  . Schizophrenia (HCC)   . Tachycardia     History reviewed. No pertinent surgical history.  Family History  Problem Relation Age of Onset  . Family history unknown: Yes   Social History:  reports that he has quit smoking. His smoking use included Cigarettes. He quit after 4.00 years of use. He has never used smokeless tobacco. He reports that he does not drink alcohol or use drugs.  Allergies:  Allergies  Allergen Reactions  . Divalproex Sodium   . Shellfish-Derived Products      (Not in a hospital admission)  No results found for this or any previous visit (from the past 48 hour(s)). No results found.  Review of Systems  Constitutional: Negative.   HENT: Negative.   Eyes: Negative.   Respiratory: Negative.   Cardiovascular: Negative.   Gastrointestinal: Negative.   Musculoskeletal: Negative.   Skin: Negative.   Neurological: Negative.   Psychiatric/Behavioral: Negative for depression, hallucinations, memory loss, substance abuse and suicidal ideas. The patient is not nervous/anxious and does not have insomnia.     There were no vitals taken for this visit. Physical Exam  Nursing note and vitals reviewed. Constitutional: He appears well-developed and well-nourished.  HENT:  Head: Normocephalic and atraumatic.  Eyes: Pupils are equal, round, and reactive to light. Conjunctivae are normal.  Neck: Normal range of motion.  Cardiovascular: Regular rhythm and normal heart sounds.   Respiratory: Effort normal. No respiratory distress.  GI: Soft.  Musculoskeletal: Normal range of motion.  Neurological: He is alert.  Skin: Skin is warm and dry.  Psychiatric: Judgment normal. His  affect is blunt. His speech is delayed. He is slowed. Cognition and memory are impaired. He expresses no homicidal and no suicidal ideation.     Assessment/Plan Patient will be seen again in 2 weeks for follow-up ECT  [name], MD 04/23/2017, 11:55 AM

## 2017-04-23 NOTE — Anesthesia Preprocedure Evaluation (Signed)
Anesthesia Evaluation  Patient identified by MRN, date of birth, ID band Patient awake    Reviewed: Allergy & Precautions, H&P , NPO status , Patient's Chart, lab work & pertinent test results, reviewed documented beta blocker date and time   History of Anesthesia Complications Negative for: history of anesthetic complications  Airway Mallampati: II  TM Distance: >3 FB Neck ROM: Full    Dental  (+) Teeth Intact   Pulmonary neg sleep apnea, neg COPD, former smoker,    Pulmonary exam normal breath sounds clear to auscultation- rhonchi (-) wheezing      Cardiovascular Exercise Tolerance: Good (-) hypertension(-) CAD and (-) Past MI negative cardio ROS Normal cardiovascular exam Rhythm:regular Rate:Normal - Systolic murmurs and - Diastolic murmurs    Neuro/Psych PSYCHIATRIC DISORDERS Schizophrenia negative neurological ROS     GI/Hepatic negative GI ROS, Neg liver ROS,   Endo/Other  negative endocrine ROSneg diabetes  Renal/GU negative Renal ROS  negative genitourinary   Musculoskeletal   Abdominal (+) - obese,   Peds  Hematology negative hematology ROS (+)   Anesthesia Other Findings Past Medical History: No date: Schizophrenia (HCC) No date: Tachycardia History reviewed. No pertinent surgical history. BMI    Body Mass Index:  35.40 kg/m     Reproductive/Obstetrics negative OB ROS                             Anesthesia Physical  Anesthesia Plan  ASA: II  Anesthesia Plan: General   Post-op Pain Management:    Induction: Intravenous  PONV Risk Score and Plan: 1 and Ondansetron  Airway Management Planned: Mask  Additional Equipment:   Intra-op Plan:   Post-operative Plan:   Informed Consent: I have reviewed the patients History and Physical, chart, labs and discussed the procedure including the risks, benefits and alternatives for the proposed anesthesia with the patient  or authorized representative who has indicated his/her understanding and acceptance.   Dental Advisory Given  Plan Discussed with: CRNA and Anesthesiologist  Anesthesia Plan Comments:         Anesthesia Quick Evaluation  

## 2017-04-23 NOTE — Procedures (Signed)
ECT SERVICES Physician's Interval Evaluation & Treatment Note  Patient Identification: Jonathan Alexander MRN:  [id number] Date of Evaluation:  04/23/2017 TX #: 11  MADRS:   MMSE:   P.E. Findings:  No change to physical exam vital signs normal heart is normal.  Psychiatric Interval Note:  No complaints no change to mental status  Subjective:  Patient is a 31 y.o. male seen for evaluation for Electroconvulsive Therapy. No complaints  Treatment Summary:     Right Unilateral              Bilateral   % Energy : 1.0 ms 100%   Impedance: 1200 ohms  Seizure Energy Index: 10,161 V squared  Postictal Suppression Index: 26% 97%  Seizure Concordance Index: 97%  Medications  Pre Shock: Robinul 0.2 mg Brevital 70 mg succinylcholine 100 mg  Post Shock: Versed 2 mg  Seizure Duration: 27 seconds by EMG 37 seconds by EEG   Comments: Follow-up 2 weeks   Lungs:    Clear to auscultation                Other:   Heart:      Regular rhythm              irregular rhythm      Previous H&P reviewed, patient examined and there are NO CHANGES                   Previous H&P reviewed, patient examined and there are changes noted.   Jonathan Rasmussen, MD 9/19/201811:57 AM

## 2017-04-23 NOTE — Anesthesia Postprocedure Evaluation (Signed)
Anesthesia Post Note  Patient: Estate agent  Procedure(s) Performed: * No procedures listed *  Patient location during evaluation: PACU Anesthesia Type: General Level of consciousness: awake and alert and oriented Pain management: pain level controlled Vital Signs Assessment: post-procedure vital signs reviewed and stable Respiratory status: spontaneous breathing Cardiovascular status: blood pressure returned to baseline Anesthetic complications: no     Last Vitals:  Vitals:   04/23/17 1232 04/23/17 1240  BP: (!) 106/93 100/81  Pulse: 99 96  Resp: 13 16  Temp:    SpO2: 99%     Last Pain: There were no vitals filed for this visit.               Monick Rena

## 2017-04-23 NOTE — Anesthesia Post-op Follow-up Note (Signed)
Anesthesia QCDR form completed.        

## 2017-04-23 NOTE — Transfer of Care (Signed)
Immediate Anesthesia Transfer of Care Note  Patient: Jonathan Alexander  Procedure(s) Performed: ECT  Patient Location: PACU  Anesthesia Type:General  Level of Consciousness: sedated  Airway & Oxygen Therapy: Patient Spontanous Breathing  Post-op Assessment: Report given to RN and Post -op Vital signs reviewed and stable  Post vital signs: Reviewed and stable  Last Vitals:  Vitals:   04/23/17 1212  BP: 125/82  Pulse: 100  Resp: (!) 24  Temp: 37.2 C  SpO2: 99%    Last Pain: There were no vitals filed for this visit.       Complications: No apparent anesthesia complications

## 2017-05-07 ENCOUNTER — Telehealth: Payer: Self-pay

## 2017-05-13 ENCOUNTER — Other Ambulatory Visit: Payer: Self-pay | Admitting: Psychiatry

## 2017-05-14 ENCOUNTER — Encounter
Admission: RE | Admit: 2017-05-14 | Discharge: 2017-05-14 | Disposition: A | Discharge status: Home / Self Care | Payer: Medicare Other | Source: Ambulatory Visit | Attending: Psychiatry | Admitting: Psychiatry

## 2017-05-14 ENCOUNTER — Encounter: Payer: Self-pay | Admitting: Anesthesiology

## 2017-05-14 DIAGNOSIS — F259 Schizoaffective disorder, unspecified: Secondary | ICD-10-CM | POA: Diagnosis not present

## 2017-05-14 DIAGNOSIS — F250 Schizoaffective disorder, bipolar type: Secondary | ICD-10-CM | POA: Diagnosis not present

## 2017-05-14 DIAGNOSIS — F2089 Other schizophrenia: Secondary | ICD-10-CM | POA: Diagnosis not present

## 2017-05-14 DIAGNOSIS — Z87891 Personal history of nicotine dependence: Secondary | ICD-10-CM | POA: Diagnosis not present

## 2017-05-14 MED ORDER — MIDAZOLAM HCL 2 MG/2ML IJ SOLN
2.0000 mg | Freq: Once | INTRAMUSCULAR | Status: AC
  Administered 2017-05-14: 1 mg via INTRAVENOUS

## 2017-05-14 MED ORDER — GLYCOPYRROLATE 0.2 MG/ML IJ SOLN
0.2000 mg | Freq: Once | INTRAMUSCULAR | Status: AC
  Administered 2017-05-14: 0.2 mg via INTRAVENOUS

## 2017-05-14 MED ORDER — SUCCINYLCHOLINE CHLORIDE 200 MG/10ML IV SOSY
PREFILLED_SYRINGE | INTRAVENOUS | Status: DC | PRN
  Administered 2017-05-14: 100 mg via INTRAVENOUS

## 2017-05-14 MED ORDER — METHOHEXITAL SODIUM 100 MG/10ML IV SOSY
PREFILLED_SYRINGE | INTRAVENOUS | Status: DC | PRN
  Administered 2017-05-14: 70 mg via INTRAVENOUS

## 2017-05-14 MED ORDER — SODIUM CHLORIDE 0.9 % IV SOLN
500.0000 mL | Freq: Once | INTRAVENOUS | Status: AC
  Administered 2017-05-14: 500 mL via INTRAVENOUS

## 2017-05-14 MED ORDER — MIDAZOLAM HCL 2 MG/2ML IJ SOLN
INTRAMUSCULAR | Status: AC
  Filled 2017-05-14: qty 2

## 2017-05-14 MED ORDER — LABETALOL HCL 5 MG/ML IV SOLN: 10.0000 mg | INTRAVENOUS | Status: DC | PRN

## 2017-05-14 MED ORDER — SODIUM CHLORIDE 0.9 % IV SOLN
INTRAVENOUS | Status: DC | PRN
  Administered 2017-05-14: 10:00:00 via INTRAVENOUS

## 2017-05-14 MED ORDER — SUCCINYLCHOLINE CHLORIDE 20 MG/ML IJ SOLN
INTRAMUSCULAR | Status: AC
  Filled 2017-05-14: qty 1

## 2017-05-14 MED ORDER — LABETALOL HCL 5 MG/ML IV SOLN
10.0000 mg | Freq: Once | INTRAVENOUS | Status: AC
  Administered 2017-05-14: 10 mg via INTRAVENOUS

## 2017-05-14 MED ORDER — LABETALOL HCL 5 MG/ML IV SOLN
INTRAVENOUS | Status: AC
  Filled 2017-05-14: qty 4

## 2017-05-14 MED ORDER — GLYCOPYRROLATE 0.2 MG/ML IJ SOLN
INTRAMUSCULAR | Status: AC
  Filled 2017-05-14: qty 1

## 2017-05-14 NOTE — H&P (Signed)
Jonathan Alexander is an 31 y.o. male.   Chief Complaint: no new complaint HPI: schizoaffective disorder which is maintained in part with regular ECT  Past Medical History:  Diagnosis Date  . Schizophrenia (HCC)   . Tachycardia     History reviewed. No pertinent surgical history.  Family History  Problem Relation Age of Onset  . Family history unknown: Yes   Social History:  reports that he has quit smoking. His smoking use included Cigarettes. He quit after 4.00 years of use. He has never used smokeless tobacco. He reports that he does not drink alcohol or use drugs.  Allergies:  Allergies  Allergen Reactions  . Divalproex Sodium   . Shellfish-Derived Products      (Not in a hospital admission)  No results found for this or any previous visit (from the past 48 hour(s)). No results found.  Review of Systems  Constitutional: Negative.   HENT: Negative.   Eyes: Negative.   Respiratory: Negative.   Cardiovascular: Negative.   Gastrointestinal: Negative.   Musculoskeletal: Negative.   Skin: Negative.   Neurological: Negative.   Psychiatric/Behavioral: Negative.     Blood pressure (!) 143/87, pulse (!) 103, temperature (!) 97.1 F (36.2 C), temperature source Oral, resp. rate 18, height  (1.803 m), weight 103.9 kg (229 lb), SpO2 99 %. Physical Exam  Nursing note and vitals reviewed. Constitutional: He appears well-developed and well-nourished.  HENT:  Head: Normocephalic and atraumatic.  Eyes: Pupils are equal, round, and reactive to light. Conjunctivae are normal.  Neck: Normal range of motion.  Cardiovascular: Regular rhythm and normal heart sounds.   Respiratory: Effort normal. No respiratory distress.  GI: Soft.  Musculoskeletal: Normal range of motion.  Neurological: He is alert.  Skin: Skin is warm and dry.  Psychiatric: Judgment and thought content normal. His affect is blunt. His speech is delayed. He is withdrawn. Cognition and memory are normal.      Assessment/Plan Treatment today in follow-up in another 3 weeks  [name], MD 05/14/2017, 10:44 AM

## 2017-05-14 NOTE — Anesthesia Preprocedure Evaluation (Signed)
Anesthesia Evaluation  Patient identified by MRN, date of birth, ID band Patient awake    Reviewed: Allergy & Precautions, H&P , NPO status , Patient's Chart, lab work & pertinent test results, reviewed documented beta blocker date and time   History of Anesthesia Complications Negative for: history of anesthetic complications  Airway Mallampati: II  TM Distance: >3 FB Neck ROM: Full    Dental  (+) Teeth Intact   Pulmonary neg sleep apnea, neg COPD, former smoker,    Pulmonary exam normal breath sounds clear to auscultation- rhonchi (-) wheezing      Cardiovascular Exercise Tolerance: Good (-) hypertension(-) CAD and (-) Past MI negative cardio ROS Normal cardiovascular exam Rhythm:regular Rate:Normal - Systolic murmurs and - Diastolic murmurs    Neuro/Psych PSYCHIATRIC DISORDERS Schizophrenia negative neurological ROS     GI/Hepatic negative GI ROS, Neg liver ROS,   Endo/Other  negative endocrine ROSneg diabetes  Renal/GU negative Renal ROS  negative genitourinary   Musculoskeletal   Abdominal (+) - obese,   Peds  Hematology negative hematology ROS (+)   Anesthesia Other Findings Past Medical History: No date: Schizophrenia (HCC) No date: Tachycardia History reviewed. No pertinent surgical history. BMI    Body Mass Index:  35.40 kg/m     Reproductive/Obstetrics negative OB ROS                             Anesthesia Physical  Anesthesia Plan  ASA: II  Anesthesia Plan: General   Post-op Pain Management:    Induction: Intravenous  PONV Risk Score and Plan: 1 and Ondansetron  Airway Management Planned: Mask  Additional Equipment:   Intra-op Plan:   Post-operative Plan:   Informed Consent: I have reviewed the patients History and Physical, chart, labs and discussed the procedure including the risks, benefits and alternatives for the proposed anesthesia with the patient  or authorized representative who has indicated his/her understanding and acceptance.   Dental Advisory Given  Plan Discussed with: CRNA and Anesthesiologist  Anesthesia Plan Comments:         Anesthesia Quick Evaluation  

## 2017-05-14 NOTE — Transfer of Care (Signed)
Immediate Anesthesia Transfer of Care Note  Patient: Jonathan Alexander  Procedure(s) Performed: ECT TX  Patient Location: PACU  Anesthesia Type:General  Level of Consciousness: sedated  Airway & Oxygen Therapy: Patient Spontanous Breathing and Patient connected to face mask oxygen  Post-op Assessment: Report given to RN and Post -op Vital signs reviewed and stable  Post vital signs: Reviewed and stable  Last Vitals:  Vitals:   05/14/17 0932 05/14/17 1058  BP: (!) 143/87 (!) 132/48  Pulse: (!) 103 (!) 115  Resp: 18 10  Temp: (!) 36.2 C 37.2 C  SpO2: 99% 100%    Last Pain:  Vitals:   05/14/17 0932  TempSrc: Oral  PainSc: 0-No pain         Complications: No apparent anesthesia complications

## 2017-05-14 NOTE — Discharge Instructions (Signed)
1)  The drugs that you have been given will stay in your system until tomorrow so for the       next 24 hours you should not:  A. Drive an automobile  B. Make any legal decisions  C. Drink any alcoholic beverages  2)  You may resume your regular meals upon return home.  3)  A responsible adult must take you home.  Someone should stay with you for a few          hours, then be available by phone for the remainder of the treatment day.  4)  You May experience any of the following symptoms:  Headache, Nausea and a dry mouth (due to the medications you were given),  temporary memory loss and some confusion, or sore muscles (a warm bath  should help this).  If you you experience any of these symptoms let us know on                your return visit.  5)  Report any of the following: any acute discomfort, severe headache, or temperature        greater than 100.5 F.   Also report any unusual redness, swelling, drainage, or pain         at your IV site.    You may report Symptoms to:  ECT PROGRAM- [place] at [facility]          Phone: [phone], ECT Department           or Dr. Shary Key office [phone]  6)  Your next ECT Treatment is  Wednesday November 7   We will call 2 days prior to your scheduled appointment for arrival times.  7)  Nothing to eat or drink after midnight the night before your procedure.  8)  Take morning medication   With a sip of water the morning of your procedure.  9)  Other Instructions: Call [phone] to cancel the morning of your procedure due         to illness or emergency.  10) We will call within 72 hours to assess how you are feeling.

## 2017-05-14 NOTE — Anesthesia Post-op Follow-up Note (Signed)
Anesthesia QCDR form completed.        

## 2017-05-14 NOTE — Procedures (Signed)
ECT SERVICES Physician's Interval Evaluation & Treatment Note  Patient Identification: Dai Mcadams MRN:  [id number] Date of Evaluation:  05/14/2017 TX #: 12  MADRS:   MMSE:   P.E. Findings:  No change to physical exam  Psychiatric Interval Note:  Flat and blunted mood but no new complaints pretty much stable  Subjective:  Patient is a 31 y.o. male seen for evaluation for Electroconvulsive Therapy. No new subjective complaint  Treatment Summary:     Right Unilateral              Bilateral   % Energy : 1.0 ms 100%   Impedance: 1100 ohms  Seizure Energy Index: 14,720 V squared  Postictal Suppression Index: 88%  Seizure Concordance Index: 97%  Medications  Pre Shock: Robinul 0.2 mg Brevital 70 mg succinylcholine 100 mg  Post Shock: versed 1 mg  Seizure Duration: 28 seconds by EMG 28 seconds by EEG   Comments: Follow-up in 3 weeks   Lungs:    Clear to auscultation                Other:   Heart:      Regular rhythm              irregular rhythm      Previous H&P reviewed, patient examined and there are NO CHANGES                   Previous H&P reviewed, patient examined and there are changes noted.   [name], MD 10/10/201810:47 AM

## 2017-05-14 NOTE — Anesthesia Postprocedure Evaluation (Signed)
Anesthesia Post Note  Patient: Jonathan Alexander  Procedure(s) Performed: ECT TX  Patient location during evaluation: PACU Anesthesia Type: General Level of consciousness: awake and alert Pain management: pain level controlled Vital Signs Assessment: post-procedure vital signs reviewed and stable Respiratory status: spontaneous breathing and respiratory function stable Cardiovascular status: stable Anesthetic complications: no     Last Vitals:  Vitals:   05/14/17 0932 05/14/17 1058  BP: (!) 143/87 (!) 132/48  Pulse: (!) 103 (!) 115  Resp: 18 10  Temp: (!) 36.2 C 37.2 C  SpO2: 99% 100%    Last Pain:  Vitals:   05/14/17 1058  TempSrc:   PainSc: Asleep                 KEPHART,WILLIAM K

## 2017-05-20 DIAGNOSIS — Z79899 Other long term (current) drug therapy: Secondary | ICD-10-CM | POA: Diagnosis not present

## 2017-06-02 ENCOUNTER — Telehealth: Payer: Self-pay | Admitting: *Deleted

## 2017-06-03 ENCOUNTER — Other Ambulatory Visit: Payer: Self-pay | Admitting: Psychiatry

## 2017-06-04 ENCOUNTER — Telehealth: Payer: Self-pay | Admitting: *Deleted

## 2017-06-09 ENCOUNTER — Telehealth: Payer: Self-pay

## 2017-06-10 ENCOUNTER — Other Ambulatory Visit: Payer: Self-pay | Admitting: Psychiatry

## 2017-06-11 ENCOUNTER — Encounter: Payer: Self-pay | Admitting: Anesthesiology

## 2017-06-11 ENCOUNTER — Encounter
Admission: RE | Admit: 2017-06-11 | Discharge: 2017-06-11 | Disposition: A | Discharge status: Home / Self Care | Payer: Medicare Other | Source: Ambulatory Visit | Attending: Psychiatry | Admitting: Psychiatry

## 2017-06-11 DIAGNOSIS — R000 Tachycardia, unspecified: Secondary | ICD-10-CM | POA: Insufficient documentation

## 2017-06-11 DIAGNOSIS — F209 Schizophrenia, unspecified: Secondary | ICD-10-CM | POA: Diagnosis not present

## 2017-06-11 DIAGNOSIS — F329 Major depressive disorder, single episode, unspecified: Secondary | ICD-10-CM | POA: Insufficient documentation

## 2017-06-11 DIAGNOSIS — F332 Major depressive disorder, recurrent severe without psychotic features: Secondary | ICD-10-CM

## 2017-06-11 DIAGNOSIS — F259 Schizoaffective disorder, unspecified: Secondary | ICD-10-CM | POA: Insufficient documentation

## 2017-06-11 MED ORDER — METHOHEXITAL SODIUM 100 MG/10ML IV SOSY
PREFILLED_SYRINGE | INTRAVENOUS | Status: DC | PRN
  Administered 2017-06-11: 70 mg via INTRAVENOUS

## 2017-06-11 MED ORDER — SUCCINYLCHOLINE CHLORIDE 20 MG/ML IJ SOLN
INTRAMUSCULAR | Status: AC
  Filled 2017-06-11: qty 1

## 2017-06-11 MED ORDER — MIDAZOLAM HCL 2 MG/2ML IJ SOLN
INTRAMUSCULAR | Status: AC
  Filled 2017-06-11: qty 2

## 2017-06-11 MED ORDER — SODIUM CHLORIDE 0.9 % IV SOLN
INTRAVENOUS | Status: DC | PRN
  Administered 2017-06-11: 10:00:00 via INTRAVENOUS

## 2017-06-11 MED ORDER — MIDAZOLAM HCL 2 MG/2ML IJ SOLN
INTRAMUSCULAR | Status: DC | PRN
  Administered 2017-06-11: 1 mg via INTRAVENOUS

## 2017-06-11 MED ORDER — SUCCINYLCHOLINE CHLORIDE 200 MG/10ML IV SOSY
PREFILLED_SYRINGE | INTRAVENOUS | Status: DC | PRN
  Administered 2017-06-11: 100 mg via INTRAVENOUS

## 2017-06-11 MED ORDER — SODIUM CHLORIDE 0.9 % IV SOLN
500.0000 mL | Freq: Once | INTRAVENOUS | Status: AC
  Administered 2017-06-11: 500 mL via INTRAVENOUS

## 2017-06-11 NOTE — H&P (Signed)
Jonathan Alexander is an 31 y.o. male.   Chief Complaint: Patient has no chief complaint.  Not reporting any active mood complaints.  Chronically paranoid but keeps it quiet.  No physical complaints. HPI: History of schizoaffective disorder with benefit from ECT and continued maintenance ECT.  Past Medical History:  Diagnosis Date  . Schizophrenia (HCC)   . Tachycardia     History reviewed. No pertinent surgical history.  Family History  Family history unknown: Yes   Social History:  reports that he has quit smoking. His smoking use included cigarettes. He quit after 4.00 years of use. he has never used smokeless tobacco. He reports that he does not drink alcohol or use drugs.  Allergies:  Allergies  Allergen Reactions  . Divalproex Sodium   . Shellfish-Derived Products      (Not in a hospital admission)  No results found for this or any previous visit (from the past 48 hour(s)). No results found.  Review of Systems  Constitutional: Negative.   HENT: Negative.   Eyes: Negative.   Respiratory: Negative.   Cardiovascular: Negative.   Gastrointestinal: Negative.   Musculoskeletal: Negative.   Skin: Negative.   Neurological: Negative.   Psychiatric/Behavioral: Negative for depression, hallucinations, memory loss, substance abuse and suicidal ideas. The patient is not nervous/anxious and does not have insomnia.     Blood pressure 97/67, pulse 95, temperature (!) 96.6 F (35.9 C), temperature source Tympanic, resp. rate 16, height 5\' 11"  (1.803 m), weight 98.4 kg (217 lb), SpO2 100 %. Physical Exam  Nursing note and vitals reviewed. Constitutional: He appears well-developed and well-nourished.  HENT:  Head: Normocephalic and atraumatic.  Eyes: Conjunctivae are normal. Pupils are equal, round, and reactive to light.  Neck: Normal range of motion.  Cardiovascular: Regular rhythm and normal heart sounds.  Respiratory: Effort normal. No respiratory distress.  GI: Soft.   Musculoskeletal: Normal range of motion.  Neurological: He is alert.  Skin: Skin is warm and dry.  Psychiatric: Judgment normal. His affect is blunt. His speech is delayed. He is slowed. Thought content is paranoid. Cognition and memory are impaired. He expresses no homicidal and no suicidal ideation.     Assessment/Plan Continuing treatment on an basis of every few weeks.  Has been doing well for quite a while and we may be able to start stretching things out a little farther.  Jonathan RasmussenJohn Francesca Strome, MD 06/11/2017, 9:45 AM

## 2017-06-11 NOTE — Discharge Instructions (Signed)
1)  The drugs that you have been given will stay in your system until tomorrow so for the       next 24 hours you should not:  A. Drive an automobile  B. Make any legal decisions  C. Drink any alcoholic beverages  2)  You may resume your regular meals upon return home.  3)  A responsible adult must take you home.  Someone should stay with you for a few          hours, then be available by phone for the remainder of the treatment day.  4)  You May experience any of the following symptoms:  Headache, Nausea and a dry mouth (due to the medications you were given),  temporary memory loss and some confusion, or sore muscles (a warm bath  should help this).  If you you experience any of these symptoms let us know on                your return visit.  5)  Report any of the following: any acute discomfort, severe headache, or temperature        greater than 100.5 F.   Also report any unusual redness, swelling, drainage, or pain         at your IV site.    You may report Symptoms to:  ECT [facility]- [place] [facility]          Phone: [phone], ECT Department           or Dr. Shary Keylapac's office [phone]  6)  Your next ECT Treatment is Day Wednesday  Date July 09 2017   We will call 2 days prior to your scheduled appointment for arrival times.  7)  Nothing to eat or drink after midnight the night before your procedure.  8)  Take all Morning meds   With a sip of water the morning of your procedure.  9)  Other Instructions: Call [phone] to cancel the morning of your procedure due         to illness or emergency.  10) We will call within 72 hours to assess how you are feeling.

## 2017-06-11 NOTE — Anesthesia Post-op Follow-up Note (Signed)
Anesthesia QCDR form completed.        

## 2017-06-11 NOTE — Anesthesia Preprocedure Evaluation (Addendum)
Anesthesia Evaluation  Patient identified by MRN, date of birth, ID band Patient awake    Reviewed: Allergy & Precautions, NPO status , Patient's Chart, lab work & pertinent test results, reviewed documented beta blocker date and time   History of Anesthesia Complications Negative for: history of anesthetic complications  Airway Mallampati: II  TM Distance: >3 FB     Dental  (+) Chipped   Pulmonary neg sleep apnea, neg COPD, former smoker,    breath sounds clear to auscultation- rhonchi (-) wheezing      Cardiovascular Exercise Tolerance: Good (-) hypertension(-) CAD, (-) Past MI and (-) Cardiac Stents  Rhythm:Regular Rate:Normal - Systolic murmurs and - Diastolic murmurs    Neuro/Psych PSYCHIATRIC DISORDERS Schizophrenia negative neurological ROS     GI/Hepatic negative GI ROS, Neg liver ROS,   Endo/Other  negative endocrine ROSneg diabetes  Renal/GU negative Renal ROS     Musculoskeletal negative musculoskeletal ROS (+)   Abdominal (+) + obese,   Peds  Hematology negative hematology ROS (+)   Anesthesia Other Findings Past Medical History: No date: Schizophrenia (HCC) No date: Tachycardia   Reproductive/Obstetrics                            Anesthesia Physical Anesthesia Plan  ASA: III  Anesthesia Plan: General   Post-op Pain Management:    Induction: Intravenous  PONV Risk Score and Plan: 2 and Ondansetron  Airway Management Planned: Mask  Additional Equipment:   Intra-op Plan:   Post-operative Plan:   Informed Consent: I have reviewed the patients History and Physical, chart, labs and discussed the procedure including the risks, benefits and alternatives for the proposed anesthesia with the patient or authorized representative who has indicated his/her understanding and acceptance.     Plan Discussed with: CRNA and Anesthesiologist  Anesthesia Plan Comments:         Anesthesia Quick Evaluation

## 2017-06-11 NOTE — Transfer of Care (Signed)
Immediate Anesthesia Transfer of Care Note  Patient: Jonathan Alexander  Procedure(s) Performed: ECT TX  Patient Location: PACU  Anesthesia Type:General  Level of Consciousness: sedated  Airway & Oxygen Therapy: Patient Spontanous Breathing and Patient connected to face mask oxygen  Post-op Assessment: Report given to RN and Post -op Vital signs reviewed and stable  Post vital signs: Reviewed and stable  Last Vitals:  Vitals:   06/11/17 0926 06/11/17 1119  BP: 97/67 125/90  Pulse: 95 95  Resp: 16 16  Temp: (!) 35.9 C (!) 36.3 C  SpO2: 100% 98%    Last Pain:  Vitals:   06/11/17 1119  TempSrc:   PainSc: Asleep         Complications: No apparent anesthesia complications

## 2017-06-11 NOTE — Procedures (Signed)
ECT SERVICES Physician's Interval Evaluation & Treatment Note  Patient Identification: Jonathan FriendlyDemetrio Alexander MRN:  [id number] Date of Evaluation:  06/11/2017 TX #: 13  MADRS:   MMSE:   P.E. Findings:  No change to physical exam.  Psychiatric Interval Note:  Affect blunted euthymic about the same as usual no change  Subjective:  Patient is a 31 y.o. male seen for evaluation for Electroconvulsive Therapy. No subjective complaint  Treatment Summary:   []   Right Unilateral             [x]  Bilateral   % Energy : 1.0 ms 100%   Impedance: 920 ohms  Seizure Energy Index: 13,950 V squared  Postictal Suppression Index: 92%  Seizure Concordance Index: 97%  Medications  Pre Shock: Robinul 0.2 mg Brevital 70 mg succinylcholine 100 mg  Post Shock: Versed 1 mg  Seizure Duration: 26 seconds by EMG 27 seconds by EEG   Comments: Follow-up 4 weeks  Lungs:  [x]   Clear to auscultation               []  Other:   Heart:    [x]   Regular rhythm             []  irregular rhythm    [x]   Previous H&P reviewed, patient examined and there are NO CHANGES                 []   Previous H&P reviewed, patient examined and there are changes noted.   Mordecai RasmussenJohn Garreth Burnsworth, MD 11/7/201811:03 AM

## 2017-06-11 NOTE — Anesthesia Procedure Notes (Signed)
Date/Time: 06/11/2017 11:11 AM Performed by: Lily KocherPeralta, Adonte Vanriper, CRNA Pre-anesthesia Checklist: Patient identified, Emergency Drugs available, Suction available and Patient being monitored Patient Re-evaluated:Patient Re-evaluated prior to induction Oxygen Delivery Method: Circle system utilized Preoxygenation: Pre-oxygenation with 100% oxygen Induction Type: IV induction Ventilation: Mask ventilation without difficulty and Mask ventilation throughout procedure Airway Equipment and Method: Bite block Placement Confirmation: positive ETCO2 Dental Injury: Teeth and Oropharynx as per pre-operative assessment

## 2017-06-11 NOTE — Anesthesia Postprocedure Evaluation (Signed)
Anesthesia Post Note  Patient: Jonathan Alexander  Procedure(s) Performed: ECT TX  Patient location during evaluation: PACU Anesthesia Type: General Level of consciousness: awake and alert Pain management: pain level controlled Vital Signs Assessment: post-procedure vital signs reviewed and stable Respiratory status: spontaneous breathing, nonlabored ventilation and respiratory function stable Cardiovascular status: blood pressure returned to baseline and stable Postop Assessment: no signs of nausea or vomiting Anesthetic complications: no     Last Vitals:  Vitals:   06/11/17 1139 06/11/17 1147  BP: 126/78 121/79  Pulse: 96 96  Resp: 16 16  Temp:    SpO2: 98%     Last Pain:  Vitals:   06/11/17 1139  TempSrc:   PainSc: 0-No pain                 Jordie Skalsky

## 2017-06-18 DIAGNOSIS — D649 Anemia, unspecified: Secondary | ICD-10-CM | POA: Diagnosis not present

## 2017-06-19 DIAGNOSIS — Z79899 Other long term (current) drug therapy: Secondary | ICD-10-CM | POA: Diagnosis not present

## 2017-06-23 DIAGNOSIS — J301 Allergic rhinitis due to pollen: Secondary | ICD-10-CM | POA: Diagnosis not present

## 2017-06-23 DIAGNOSIS — K5900 Constipation, unspecified: Secondary | ICD-10-CM | POA: Diagnosis not present

## 2017-06-23 DIAGNOSIS — F259 Schizoaffective disorder, unspecified: Secondary | ICD-10-CM | POA: Diagnosis not present

## 2017-06-23 DIAGNOSIS — D649 Anemia, unspecified: Secondary | ICD-10-CM | POA: Diagnosis not present

## 2017-06-23 DIAGNOSIS — L272 Dermatitis due to ingested food: Secondary | ICD-10-CM | POA: Diagnosis not present

## 2017-06-23 DIAGNOSIS — E669 Obesity, unspecified: Secondary | ICD-10-CM | POA: Diagnosis not present

## 2017-06-23 DIAGNOSIS — R000 Tachycardia, unspecified: Secondary | ICD-10-CM | POA: Diagnosis not present

## 2017-06-23 DIAGNOSIS — R748 Abnormal levels of other serum enzymes: Secondary | ICD-10-CM | POA: Diagnosis not present

## 2017-07-07 ENCOUNTER — Telehealth: Payer: Self-pay | Admitting: *Deleted

## 2017-07-08 ENCOUNTER — Other Ambulatory Visit: Payer: Self-pay | Admitting: Psychiatry

## 2017-07-09 ENCOUNTER — Encounter: Payer: Self-pay | Admitting: Anesthesiology

## 2017-07-09 ENCOUNTER — Ambulatory Visit: Payer: Medicare Other

## 2017-07-09 ENCOUNTER — Telehealth: Payer: Self-pay | Admitting: *Deleted

## 2017-07-10 ENCOUNTER — Other Ambulatory Visit: Payer: Self-pay | Admitting: Psychiatry

## 2017-07-11 ENCOUNTER — Encounter: Payer: Self-pay | Admitting: Anesthesiology

## 2017-07-11 ENCOUNTER — Encounter
Admission: RE | Admit: 2017-07-11 | Discharge: 2017-07-11 | Disposition: A | Discharge status: Home / Self Care | Payer: Medicare Other | Source: Ambulatory Visit | Attending: Psychiatry | Admitting: Psychiatry

## 2017-07-11 DIAGNOSIS — G4730 Sleep apnea, unspecified: Secondary | ICD-10-CM | POA: Diagnosis not present

## 2017-07-11 DIAGNOSIS — Z87891 Personal history of nicotine dependence: Secondary | ICD-10-CM | POA: Diagnosis not present

## 2017-07-11 DIAGNOSIS — E119 Type 2 diabetes mellitus without complications: Secondary | ICD-10-CM | POA: Diagnosis not present

## 2017-07-11 DIAGNOSIS — F2089 Other schizophrenia: Secondary | ICD-10-CM | POA: Diagnosis not present

## 2017-07-11 DIAGNOSIS — F209 Schizophrenia, unspecified: Secondary | ICD-10-CM | POA: Insufficient documentation

## 2017-07-11 DIAGNOSIS — J449 Chronic obstructive pulmonary disease, unspecified: Secondary | ICD-10-CM | POA: Diagnosis not present

## 2017-07-11 DIAGNOSIS — F332 Major depressive disorder, recurrent severe without psychotic features: Secondary | ICD-10-CM

## 2017-07-11 MED ORDER — SODIUM CHLORIDE 0.9 % IV SOLN
INTRAVENOUS | Status: DC | PRN
  Administered 2017-07-11: 09:00:00 via INTRAVENOUS

## 2017-07-11 MED ORDER — METHOHEXITAL SODIUM 100 MG/10ML IV SOSY
PREFILLED_SYRINGE | INTRAVENOUS | Status: DC | PRN
  Administered 2017-07-11: 70 mg via INTRAVENOUS

## 2017-07-11 MED ORDER — MIDAZOLAM HCL 2 MG/2ML IJ SOLN
INTRAMUSCULAR | Status: AC
  Filled 2017-07-11: qty 2

## 2017-07-11 MED ORDER — SODIUM CHLORIDE 0.9 % IV SOLN
500.0000 mL | Freq: Once | INTRAVENOUS | Status: AC
  Administered 2017-07-11: 500 mL via INTRAVENOUS

## 2017-07-11 MED ORDER — MIDAZOLAM HCL 2 MG/2ML IJ SOLN
INTRAMUSCULAR | Status: DC | PRN
  Administered 2017-07-11: 1 mg via INTRAVENOUS

## 2017-07-11 MED ORDER — SUCCINYLCHOLINE CHLORIDE 200 MG/10ML IV SOSY
PREFILLED_SYRINGE | INTRAVENOUS | Status: DC | PRN
  Administered 2017-07-11: 100 mg via INTRAVENOUS

## 2017-07-11 NOTE — Addendum Note (Signed)
Encounter addended by: Tommie SamsWillis, Toivo Bordon G, RN on: 07/11/2017 10:58 AM  Actions taken: Charge Capture section accepted

## 2017-07-11 NOTE — Anesthesia Preprocedure Evaluation (Signed)
Anesthesia Evaluation  Patient identified by MRN, date of birth, ID band Patient awake    Reviewed: Allergy & Precautions, NPO status , Patient's Chart, lab work & pertinent test results, reviewed documented beta blocker date and time   History of Anesthesia Complications Negative for: history of anesthetic complications  Airway Mallampati: II  TM Distance: >3 FB     Dental  (+) Chipped   Pulmonary neg sleep apnea, neg COPD, former smoker,    breath sounds clear to auscultation- rhonchi (-) wheezing      Cardiovascular Exercise Tolerance: Good (-) hypertension(-) CAD, (-) Past MI and (-) Cardiac Stents  Rhythm:Regular Rate:Normal - Systolic murmurs and - Diastolic murmurs    Neuro/Psych PSYCHIATRIC DISORDERS Schizophrenia negative neurological ROS     GI/Hepatic negative GI ROS, Neg liver ROS,   Endo/Other  negative endocrine ROSneg diabetes  Renal/GU negative Renal ROS     Musculoskeletal negative musculoskeletal ROS (+)   Abdominal (+) + obese,   Peds  Hematology negative hematology ROS (+)   Anesthesia Other Findings Past Medical History: No date: Schizophrenia (HCC) No date: Tachycardia   Reproductive/Obstetrics                            Anesthesia Physical Anesthesia Plan  ASA: III  Anesthesia Plan: General   Post-op Pain Management:    Induction: Intravenous  PONV Risk Score and Plan: 2 and Ondansetron  Airway Management Planned: Mask  Additional Equipment:   Intra-op Plan:   Post-operative Plan:   Informed Consent: I have reviewed the patients History and Physical, chart, labs and discussed the procedure including the risks, benefits and alternatives for the proposed anesthesia with the patient or authorized representative who has indicated his/her understanding and acceptance.     Plan Discussed with: CRNA and Anesthesiologist  Anesthesia Plan Comments:         Anesthesia Quick Evaluation  

## 2017-07-11 NOTE — Transfer of Care (Signed)
Immediate Anesthesia Transfer of Care Note  Patient: Jonathan Alexander  Procedure(s) Performed: ECT TX  Patient Location: PACU  Anesthesia Type:General  Level of Consciousness: sedated  Airway & Oxygen Therapy: Patient Spontanous Breathing and Patient connected to face mask oxygen  Post-op Assessment: Report given to RN and Post -op Vital signs reviewed and stable  Post vital signs: Reviewed and stable  Last Vitals:  Vitals:   07/11/17 1004  BP: (P) 126/74  Pulse: (!) 129  Resp: 16  Temp: 36.9 C  SpO2: 99%    Last Pain:  Vitals:   07/11/17 1004  PainSc: (P) Asleep         Complications: No apparent anesthesia complications

## 2017-07-11 NOTE — Discharge Instructions (Signed)
1)  The drugs that you have been given will stay in your system until tomorrow so for the       next 24 hours you should not:  A. Drive an automobile  B. Make any legal decisions  C. Drink any alcoholic beverages  2)  You may resume your regular meals upon return home.  3)  A responsible adult must take you home.  Someone should stay with you for a few          hours, then be available by phone for the remainder of the treatment day.  4)  You May experience any of the following symptoms:  Ect consent form given to pt and shaun(care given) to read with information about side effects of ECT  Headache, Nausea and a dry mouth (due to the medications you were given),  temporary memory loss and some confusion, or sore muscles (a warm bath  should help this).  If you you experience any of these symptoms let us know on                your return visit.  5)  Report any of the following: any acute discomfort, severe headache, or temperature        greater than 100.5 F.   Also report any unusual redness, swelling, drainage, or pain         at your IV site.    You may report Symptoms to:  [facility]- [place] [facility]          Phone: [phone], ECT Department           or Dr. Shary Keylapac's office [phone]  6)  Your next ECT Treatment is Day  Wednesday   Date  August 06, 2017  We will call 2 days prior to your scheduled appointment for arrival times.  7)  Nothing to eat or drink after midnight the night before your procedure.  8)  Take all am medications    With a sip of water the morning of your procedure.  9)  Other Instructions: Call [phone] to cancel the morning of your procedure due         to illness or emergency.  10) We will call within 72 hours to assess how you are feeling.

## 2017-07-11 NOTE — Procedures (Signed)
ECT SERVICES Physician's Interval Evaluation & Treatment Note  Patient Identification: Jonathan Alexander MRN:  [id number] Date of Evaluation:  07/11/2017 TX #: 14  MADRS: 11  MMSE: 24  P.E. Findings:  No change to physical exam.  Vitals normal  Psychiatric Interval Note:  Mood is good.  No psychotic symptoms  Subjective:  Patient is a 31 y.o. male seen for evaluation for Electroconvulsive Therapy. Feeling well no specific change  Treatment Summary:   []   Right Unilateral             []  Bilateral   % Energy : 1.0 ms 100%   Impedance: 850 ohms  Seizure Energy Index: 10,918 V squared  Postictal Suppression Index: 92%  Seizure Concordance Index: 94%  Medications  Pre Shock: Robinul 0.2 mg Brevital 70 mg succinylcholine 100 mg  Post Shock: Versed 1 mg  Seizure Duration: EMG 26 seconds EEG 26 seconds   Comments: Follow-up January 2  Lungs:  [x]   Clear to auscultation               []  Other:   Heart:    [x]   Regular rhythm             []  irregular rhythm    [x]   Previous H&P reviewed, patient examined and there are NO CHANGES                 []   Previous H&P reviewed, patient examined and there are changes noted.   Mordecai RasmussenJohn Sharmaine Bain, MD 12/7/20189:47 AM

## 2017-07-11 NOTE — Anesthesia Postprocedure Evaluation (Signed)
Anesthesia Post Note  Patient: Jonathan Alexander  Procedure(s) Performed: ECT TX  Patient location during evaluation: PACU Anesthesia Type: General Level of consciousness: awake and alert Pain management: pain level controlled Vital Signs Assessment: post-procedure vital signs reviewed and stable Respiratory status: spontaneous breathing, nonlabored ventilation and respiratory function stable Cardiovascular status: blood pressure returned to baseline and stable Postop Assessment: no signs of nausea or vomiting Anesthetic complications: no     Last Vitals:  Vitals:   07/11/17 1044 07/11/17 1057  BP: 123/79 116/78  Pulse: (!) 129 (!) 128  Resp: 19 16  Temp: 36.8 C   SpO2: 95%     Last Pain:  Vitals:   07/11/17 1057  PainSc: 0-No pain                 Lauris Serviss

## 2017-07-11 NOTE — Anesthesia Post-op Follow-up Note (Signed)
Anesthesia QCDR form completed.        

## 2017-07-11 NOTE — H&P (Signed)
Jonathan Alexander is an 31 y.o. male.   Chief Complaint: Patient has no chief complaint.  Mood has been good.  Denies psychotic symptoms. HPI: History of recurrent severe depression  Past Medical History:  Diagnosis Date  . Schizophrenia (HCC)   . Tachycardia     History reviewed. No pertinent surgical history.  Family History  Family history unknown: Yes   Social History:  reports that he has quit smoking. His smoking use included cigarettes. He quit after 4.00 years of use. he has never used smokeless tobacco. He reports that he does not drink alcohol or use drugs.  Allergies:  Allergies  Allergen Reactions  . Divalproex Sodium   . Shellfish-Derived Products      (Not in a hospital admission)  No results found for this or any previous visit (from the past 48 hour(s)). No results found.  Review of Systems  Constitutional: Negative.   HENT: Negative.   Eyes: Negative.   Respiratory: Negative.   Cardiovascular: Negative.   Gastrointestinal: Negative.   Musculoskeletal: Negative.   Skin: Negative.   Neurological: Negative.   Psychiatric/Behavioral: Negative.     There were no vitals taken for this visit. Physical Exam  Nursing note and vitals reviewed. Constitutional: He appears well-developed and well-nourished.  HENT:  Head: Normocephalic and atraumatic.  Eyes: Conjunctivae are normal. Pupils are equal, round, and reactive to light.  Neck: Normal range of motion.  Cardiovascular: Regular rhythm and normal heart sounds.  Respiratory: Effort normal and breath sounds normal. No respiratory distress.  GI: Soft.  Musculoskeletal: Normal range of motion.  Neurological: He is alert.  Skin: Skin is warm and dry.  Psychiatric: Judgment normal. His affect is blunt. His speech is delayed. He is slowed. Cognition and memory are impaired. He expresses no homicidal and no suicidal ideation.     Assessment/Plan Patient is doing well stable with ECT maintenance follow-up  January 2  Jonathan RasmussenJohn Trang Bouse, MD 07/11/2017, 9:45 AM

## 2017-07-22 DIAGNOSIS — Z79899 Other long term (current) drug therapy: Secondary | ICD-10-CM | POA: Diagnosis not present

## 2017-07-22 DIAGNOSIS — R5383 Other fatigue: Secondary | ICD-10-CM | POA: Diagnosis not present

## 2017-07-23 ENCOUNTER — Telehealth: Payer: Self-pay

## 2017-08-05 ENCOUNTER — Other Ambulatory Visit: Payer: Self-pay | Admitting: Psychiatry

## 2017-08-06 ENCOUNTER — Encounter: Payer: Self-pay | Admitting: Anesthesiology

## 2017-08-06 ENCOUNTER — Encounter
Admission: RE | Admit: 2017-08-06 | Discharge: 2017-08-06 | Disposition: A | Discharge status: Home / Self Care | Payer: Medicare Other | Source: Ambulatory Visit | Attending: Psychiatry | Admitting: Psychiatry

## 2017-08-06 DIAGNOSIS — F259 Schizoaffective disorder, unspecified: Secondary | ICD-10-CM | POA: Insufficient documentation

## 2017-08-06 DIAGNOSIS — R000 Tachycardia, unspecified: Secondary | ICD-10-CM | POA: Diagnosis not present

## 2017-08-06 DIAGNOSIS — F250 Schizoaffective disorder, bipolar type: Secondary | ICD-10-CM | POA: Diagnosis not present

## 2017-08-06 DIAGNOSIS — Z91013 Allergy to seafood: Secondary | ICD-10-CM | POA: Diagnosis not present

## 2017-08-06 DIAGNOSIS — Z87891 Personal history of nicotine dependence: Secondary | ICD-10-CM | POA: Diagnosis not present

## 2017-08-06 DIAGNOSIS — Z888 Allergy status to other drugs, medicaments and biological substances status: Secondary | ICD-10-CM | POA: Insufficient documentation

## 2017-08-06 MED ORDER — SUCCINYLCHOLINE CHLORIDE 20 MG/ML IJ SOLN
INTRAMUSCULAR | Status: AC
  Filled 2017-08-06: qty 1

## 2017-08-06 MED ORDER — SUCCINYLCHOLINE CHLORIDE 200 MG/10ML IV SOSY
PREFILLED_SYRINGE | INTRAVENOUS | Status: DC | PRN
  Administered 2017-08-06: 100 mg via INTRAVENOUS

## 2017-08-06 MED ORDER — SODIUM CHLORIDE 0.9 % IV SOLN
INTRAVENOUS | Status: DC | PRN
  Administered 2017-08-06: 10:00:00 via INTRAVENOUS

## 2017-08-06 MED ORDER — MIDAZOLAM HCL 2 MG/2ML IJ SOLN
INTRAMUSCULAR | Status: AC
  Filled 2017-08-06: qty 2

## 2017-08-06 MED ORDER — METHOHEXITAL SODIUM 100 MG/10ML IV SOSY
PREFILLED_SYRINGE | INTRAVENOUS | Status: DC | PRN
  Administered 2017-08-06: 70 mg via INTRAVENOUS

## 2017-08-06 MED ORDER — KETOROLAC TROMETHAMINE 30 MG/ML IJ SOLN
30.0000 mg | Freq: Once | INTRAMUSCULAR | Status: AC
  Administered 2017-08-06: 30 mg via INTRAVENOUS

## 2017-08-06 MED ORDER — KETOROLAC TROMETHAMINE 30 MG/ML IJ SOLN
INTRAMUSCULAR | Status: AC
Start: 2017-08-06 — End: 2017-08-06
  Filled 2017-08-06: qty 1

## 2017-08-06 MED ORDER — ONDANSETRON HCL 4 MG/2ML IJ SOLN: 4.0000 mg | Freq: Once | INTRAMUSCULAR | Status: DC | PRN

## 2017-08-06 MED ORDER — FENTANYL CITRATE (PF) 100 MCG/2ML IJ SOLN: 25.0000 ug | INTRAMUSCULAR | Status: DC | PRN

## 2017-08-06 MED ORDER — SODIUM CHLORIDE 0.9 % IV SOLN
500.0000 mL | Freq: Once | INTRAVENOUS | Status: AC
  Administered 2017-08-06: 500 mL via INTRAVENOUS

## 2017-08-06 MED ORDER — GLYCOPYRROLATE 0.2 MG/ML IJ SOLN
INTRAMUSCULAR | Status: AC
  Filled 2017-08-06: qty 1

## 2017-08-06 MED ORDER — GLYCOPYRROLATE 0.2 MG/ML IJ SOLN
0.2000 mg | Freq: Once | INTRAMUSCULAR | Status: AC
  Administered 2017-08-06: 0.2 mg via INTRAVENOUS

## 2017-08-06 NOTE — Discharge Instructions (Signed)
1)  The drugs that you have been given will stay in your system until tomorrow so for the       next 24 hours you should not:  A. Drive an automobile  B. Make any legal decisions  C. Drink any alcoholic beverages  2)  You may resume your regular meals upon return home.  3)  A responsible adult must take you home.  Someone should stay with you for a few          hours, then be available by phone for the remainder of the treatment day.  4)  You May experience any of the following symptoms:  Headache, Nausea and a dry mouth (due to the medications you were given),  temporary memory loss and some confusion, or sore muscles (a warm bath  should help this).  If you you experience any of these symptoms let us know on                your return visit.  5)  Report any of the following: any acute discomfort, severe headache, or temperature        greater than 100.5 F.   Also report any unusual redness, swelling, drainage, or pain         at your IV site.    You may report Symptoms to:  ECT PROGRAM- [place] [facility]          Phone: [phone], ECT Department           or Dr. Shary Keylapac's office [phone]  6)  Your next ECT Treatment is Wednesday January 30   We will call 2 days prior to your scheduled appointment for arrival times.  7)  Nothing to eat or drink after midnight the night before your procedure.  8)  Take your regular morning meds    With a sip of water the morning of your procedure.  9)  Other Instructions: Call [phone] to cancel the morning of your procedure due         to illness or emergency.  10) We will call within 72 hours to assess how you are feeling.

## 2017-08-06 NOTE — Procedures (Signed)
ECT SERVICES Physician's Interval Evaluation & Treatment Note  Patient Identification: Jonathan FriendlyDemetrio Alexander MRN:  [id number] Date of Evaluation:  08/06/2017 TX #: 15  MADRS:   MMSE:   P.E. Findings:  No change to physical exam  Psychiatric Interval Note:  A little withdrawn but not bizarre not reporting depression about the same as usual  Subjective:  Patient is a 32 y.o. male seen for evaluation for Electroconvulsive Therapy. No new complaints  Treatment Summary:   []   Right Unilateral             [x]  Bilateral   % Energy : 1.0 ms 100%   Impedance: 740 ohms  Seizure Energy Index: 11,591 V squared  Postictal Suppression Index: 93%  Seizure Concordance Index: 97%  Medications  Pre Shock: Robinul 2 mg Brevital 70 mg succinylcholine 100 mg  Post Shock: Versed 1 mg  Seizure Duration: 28 seconds by EMG 28 seconds by EEG   Comments: Appears fairly stable follow-up 4 weeks  Lungs:  [x]   Clear to auscultation               []  Other:   Heart:    [x]   Regular rhythm             []  irregular rhythm    [x]   Previous H&P reviewed, patient examined and there are NO CHANGES                 []   Previous H&P reviewed, patient examined and there are changes noted.   [name], MD 1/2/201911:30 AM

## 2017-08-06 NOTE — Anesthesia Procedure Notes (Signed)
Date/Time: 08/06/2017 11:35 AM Performed by: Lily KocherPeralta, Charday Capetillo, CRNA Pre-anesthesia Checklist: Patient identified, Emergency Drugs available, Suction available and Patient being monitored Patient Re-evaluated:Patient Re-evaluated prior to induction Oxygen Delivery Method: Circle system utilized Preoxygenation: Pre-oxygenation with 100% oxygen Induction Type: IV induction Ventilation: Mask ventilation without difficulty and Mask ventilation throughout procedure Airway Equipment and Method: Bite block Placement Confirmation: positive ETCO2 Dental Injury: Teeth and Oropharynx as per pre-operative assessment

## 2017-08-06 NOTE — Anesthesia Post-op Follow-up Note (Signed)
Anesthesia QCDR form completed.        

## 2017-08-06 NOTE — H&P (Signed)
Jonathan Alexander is an 32 y.o. male.   Chief Complaint: Patient is a little bit anxious but is not reporting depression and not expressing hostility no specific complaint no physical complaint HPI: History of schizoaffective disorder with some stability provided now by maintenance ECT  Past Medical History:  Diagnosis Date  . Schizophrenia (HCC)   . Tachycardia     History reviewed. No pertinent surgical history.  Family History  Family history unknown: Yes   Social History:  reports that he has quit smoking. His smoking use included cigarettes. He quit after 4.00 years of use. he has never used smokeless tobacco. He reports that he does not drink alcohol or use drugs.  Allergies:  Allergies  Allergen Reactions  . Divalproex Sodium   . Shellfish-Derived Products      (Not in a hospital admission)  No results found for this or any previous visit (from the past 48 hour(s)). No results found.  Review of Systems  Constitutional: Negative.   HENT: Negative.   Eyes: Negative.   Respiratory: Negative.   Cardiovascular: Negative.   Gastrointestinal: Negative.   Musculoskeletal: Negative.   Skin: Negative.   Neurological: Negative.   Psychiatric/Behavioral: Negative.     Blood pressure (!) 147/89, pulse 88, temperature 97.6 F (36.4 C), temperature source Oral, height 5\' 6"  (1.676 m), weight 222 lb (100.7 kg). Physical Exam  Nursing note and vitals reviewed. Constitutional: He appears well-developed and well-nourished.  HENT:  Head: Normocephalic and atraumatic.  Eyes: Conjunctivae are normal. Pupils are equal, round, and reactive to light.  Neck: Normal range of motion.  Cardiovascular: Regular rhythm and normal heart sounds.  Respiratory: Effort normal. No respiratory distress.  GI: Soft.  Musculoskeletal: Normal range of motion.  Neurological: He is alert.  Skin: Skin is warm and dry.  Psychiatric: Judgment normal. His mood appears anxious. His affect is blunt. His  speech is delayed. He is slowed. Cognition and memory are impaired. He expresses no homicidal and no suicidal ideation.     Assessment/Plan Follow-up in 4 weeks  Jonathan RasmussenJohn Cloa Bushong, MD 08/06/2017, 11:28 AM

## 2017-08-06 NOTE — Transfer of Care (Signed)
Immediate Anesthesia Transfer of Care Note  Patient: Jonathan Alexander  Procedure(s) Performed: ECT TX  Patient Location: PACU  Anesthesia Type:General  Level of Consciousness: sedated  Airway & Oxygen Therapy: Patient Spontanous Breathing and Patient connected to face mask oxygen  Post-op Assessment: Report given to RN and Post -op Vital signs reviewed and stable  Post vital signs: Reviewed and stable  Last Vitals:  Vitals:   08/06/17 0940 08/06/17 1144  BP: (!) 147/89 131/89  Pulse: 88 91  Resp:  17  Temp: 36.4 C 37.4 C  SpO2:  100%    Last Pain:  Vitals:   08/06/17 0940  TempSrc: Oral         Complications: No apparent anesthesia complications

## 2017-08-06 NOTE — Anesthesia Postprocedure Evaluation (Signed)
Anesthesia Post Note  Patient: Jonathan Alexander  Procedure(s) Performed: ECT TX  Patient location during evaluation: PACU Anesthesia Type: General Level of consciousness: awake and alert Pain management: pain level controlled Vital Signs Assessment: post-procedure vital signs reviewed and stable Respiratory status: spontaneous breathing, nonlabored ventilation, respiratory function stable and patient connected to nasal cannula oxygen Cardiovascular status: blood pressure returned to baseline and stable Postop Assessment: no apparent nausea or vomiting Anesthetic complications: no     Last Vitals:  Vitals:   08/06/17 1215 08/06/17 1224  BP: 120/78 120/85  Pulse: 95 94  Resp: 17 16  Temp:  36.7 C  SpO2: 98% 99%    Last Pain:  Vitals:   08/06/17 0940  TempSrc: Oral                 Jonathan Alexander S

## 2017-08-06 NOTE — Anesthesia Preprocedure Evaluation (Signed)
Anesthesia Evaluation  Patient identified by MRN, date of birth, ID band Patient awake    Reviewed: Allergy & Precautions, NPO status , Patient's Chart, lab work & pertinent test results, reviewed documented beta blocker date and time   Airway Mallampati: III  TM Distance: >3 FB     Dental  (+) Chipped   Pulmonary former smoker,           Cardiovascular      Neuro/Psych PSYCHIATRIC DISORDERS Schizophrenia    GI/Hepatic   Endo/Other    Renal/GU      Musculoskeletal   Abdominal   Peds  Hematology   Anesthesia Other Findings   Reproductive/Obstetrics                             Anesthesia Physical Anesthesia Plan  ASA: III  Anesthesia Plan: General   Post-op Pain Management:    Induction: Intravenous  PONV Risk Score and Plan:   Airway Management Planned:   Additional Equipment:   Intra-op Plan:   Post-operative Plan:   Informed Consent: I have reviewed the patients History and Physical, chart, labs and discussed the procedure including the risks, benefits and alternatives for the proposed anesthesia with the patient or authorized representative who has indicated his/her understanding and acceptance.     Plan Discussed with: CRNA  Anesthesia Plan Comments:         Anesthesia Quick Evaluation

## 2017-09-03 ENCOUNTER — Encounter
Admission: RE | Admit: 2017-09-03 | Discharge: 2017-09-03 | Disposition: A | Discharge status: Home / Self Care | Payer: Medicare Other | Source: Ambulatory Visit | Attending: Psychiatry | Admitting: Psychiatry

## 2017-09-03 ENCOUNTER — Encounter: Payer: Self-pay | Admitting: Anesthesiology

## 2017-09-03 ENCOUNTER — Other Ambulatory Visit: Payer: Self-pay | Admitting: Psychiatry

## 2017-09-03 DIAGNOSIS — Z888 Allergy status to other drugs, medicaments and biological substances status: Secondary | ICD-10-CM | POA: Insufficient documentation

## 2017-09-03 DIAGNOSIS — R000 Tachycardia, unspecified: Secondary | ICD-10-CM | POA: Insufficient documentation

## 2017-09-03 DIAGNOSIS — F250 Schizoaffective disorder, bipolar type: Secondary | ICD-10-CM | POA: Diagnosis not present

## 2017-09-03 DIAGNOSIS — Z87891 Personal history of nicotine dependence: Secondary | ICD-10-CM | POA: Diagnosis not present

## 2017-09-03 DIAGNOSIS — E669 Obesity, unspecified: Secondary | ICD-10-CM | POA: Diagnosis not present

## 2017-09-03 DIAGNOSIS — F209 Schizophrenia, unspecified: Secondary | ICD-10-CM | POA: Diagnosis not present

## 2017-09-03 DIAGNOSIS — Z91013 Allergy to seafood: Secondary | ICD-10-CM | POA: Insufficient documentation

## 2017-09-03 DIAGNOSIS — F259 Schizoaffective disorder, unspecified: Secondary | ICD-10-CM | POA: Diagnosis not present

## 2017-09-03 MED ORDER — SODIUM CHLORIDE 0.9 % IV SOLN
INTRAVENOUS | Status: DC | PRN
  Administered 2017-09-03: 09:00:00 via INTRAVENOUS

## 2017-09-03 MED ORDER — MIDAZOLAM HCL 2 MG/2ML IJ SOLN
2.0000 mg | Freq: Once | INTRAMUSCULAR | Status: AC
  Administered 2017-09-03: 1 mg via INTRAVENOUS

## 2017-09-03 MED ORDER — GLYCOPYRROLATE 0.2 MG/ML IJ SOLN: 0.2000 mg | Freq: Once | INTRAMUSCULAR | Status: DC

## 2017-09-03 MED ORDER — MIDAZOLAM HCL 2 MG/2ML IJ SOLN
INTRAMUSCULAR | Status: AC
  Filled 2017-09-03: qty 2

## 2017-09-03 MED ORDER — SODIUM CHLORIDE 0.9 % IV SOLN: 500.0000 mL | Freq: Once | INTRAVENOUS | Status: DC

## 2017-09-03 MED ORDER — METHOHEXITAL SODIUM 0.5 G IJ SOLR
INTRAMUSCULAR | Status: AC
  Filled 2017-09-03: qty 500

## 2017-09-03 MED ORDER — GLYCOPYRROLATE 0.2 MG/ML IJ SOLN
INTRAMUSCULAR | Status: AC
  Administered 2017-09-03: 0.2 mg via INTRAVENOUS
  Filled 2017-09-03: qty 1

## 2017-09-03 MED ORDER — SUCCINYLCHOLINE CHLORIDE 20 MG/ML IJ SOLN
INTRAMUSCULAR | Status: AC
  Filled 2017-09-03: qty 1

## 2017-09-03 MED ORDER — MIDAZOLAM HCL 2 MG/2ML IJ SOLN: 1.0000 mg | Freq: Once | INTRAMUSCULAR | Status: DC

## 2017-09-03 MED ORDER — SODIUM CHLORIDE 0.9 % IV SOLN
500.0000 mL | Freq: Once | INTRAVENOUS | Status: AC
  Administered 2017-09-03: 500 mL via INTRAVENOUS

## 2017-09-03 MED ORDER — SUCCINYLCHOLINE CHLORIDE 200 MG/10ML IV SOSY
PREFILLED_SYRINGE | INTRAVENOUS | Status: DC | PRN
  Administered 2017-09-03: 100 mg via INTRAVENOUS

## 2017-09-03 MED ORDER — MIDAZOLAM HCL 2 MG/2ML IJ SOLN: 2.0000 mg | Freq: Once | INTRAMUSCULAR | Status: DC

## 2017-09-03 MED ORDER — GLYCOPYRROLATE 0.2 MG/ML IJ SOLN
0.2000 mg | Freq: Once | INTRAMUSCULAR | Status: AC
  Administered 2017-09-03: 0.2 mg via INTRAVENOUS

## 2017-09-03 MED ORDER — METHOHEXITAL SODIUM 100 MG/10ML IV SOSY
PREFILLED_SYRINGE | INTRAVENOUS | Status: DC | PRN
  Administered 2017-09-03: 70 mg via INTRAVENOUS

## 2017-09-03 NOTE — Transfer of Care (Signed)
Immediate Anesthesia Transfer of Care Note  Patient: Jonathan Alexander  Procedure(s) Performed: ECT TX  Patient Location: PACU  Anesthesia Type:General  Level of Consciousness: sedated  Airway & Oxygen Therapy: Patient Spontanous Breathing and Patient connected to face mask oxygen  Post-op Assessment: Report given to RN and Post -op Vital signs reviewed and stable  Post vital signs: Reviewed and stable  Last Vitals:  Vitals:   09/03/17 0838  BP: 139/87  Pulse: 88  Temp: 36.5 C  SpO2: 100%    Last Pain:  Vitals:   09/03/17 0838  TempSrc: Oral         Complications: No apparent anesthesia complications

## 2017-09-03 NOTE — Progress Notes (Signed)
Awakes to call of name and looks at speaker but will not answer any questions.  Report given to ECT postop nursing staff.

## 2017-09-03 NOTE — Anesthesia Procedure Notes (Signed)
Date/Time: 09/03/2017 10:20 AM Performed by: Lily KocherPeralta, Tonya Wantz, CRNA Pre-anesthesia Checklist: Patient identified, Emergency Drugs available, Suction available and Patient being monitored Patient Re-evaluated:Patient Re-evaluated prior to induction Oxygen Delivery Method: Circle system utilized Preoxygenation: Pre-oxygenation with 100% oxygen Induction Type: IV induction Ventilation: Mask ventilation without difficulty and Mask ventilation throughout procedure Airway Equipment and Method: Bite block Placement Confirmation: positive ETCO2 Dental Injury: Teeth and Oropharynx as per pre-operative assessment

## 2017-09-03 NOTE — Anesthesia Preprocedure Evaluation (Signed)
Anesthesia Evaluation  Patient identified by MRN, date of birth, ID band Patient awake    Reviewed: Allergy & Precautions, NPO status , Patient's Chart, lab work & pertinent test results, reviewed documented beta blocker date and time   History of Anesthesia Complications Negative for: history of anesthetic complications  Airway Mallampati: II  TM Distance: >3 FB     Dental  (+) Chipped   Pulmonary neg sleep apnea, neg COPD, former smoker,    breath sounds clear to auscultation- rhonchi (-) wheezing      Cardiovascular Exercise Tolerance: Good (-) hypertension(-) CAD, (-) Past MI and (-) Cardiac Stents  Rhythm:Regular Rate:Normal - Systolic murmurs and - Diastolic murmurs    Neuro/Psych PSYCHIATRIC DISORDERS negative neurological ROS     GI/Hepatic negative GI ROS, Neg liver ROS,   Endo/Other  negative endocrine ROSneg diabetes  Renal/GU negative Renal ROS     Musculoskeletal negative musculoskeletal ROS (+)   Abdominal (+) + obese,   Peds  Hematology negative hematology ROS (+)   Anesthesia Other Findings Past Medical History: No date: Schizophrenia (HCC) No date: Tachycardia   Reproductive/Obstetrics                             Anesthesia Physical  Anesthesia Plan  ASA: III  Anesthesia Plan: General   Post-op Pain Management:    Induction: Intravenous  PONV Risk Score and Plan: 2 and Ondansetron  Airway Management Planned: Mask  Additional Equipment:   Intra-op Plan:   Post-operative Plan:   Informed Consent: I have reviewed the patients History and Physical, chart, labs and discussed the procedure including the risks, benefits and alternatives for the proposed anesthesia with the patient or authorized representative who has indicated his/her understanding and acceptance.     Plan Discussed with: CRNA and Anesthesiologist  Anesthesia Plan Comments:          Anesthesia Quick Evaluation

## 2017-09-03 NOTE — Anesthesia Postprocedure Evaluation (Signed)
Anesthesia Post Note  Patient: Jonathan Alexander  Procedure(s) Performed: ECT TX  Patient location during evaluation: PACU Anesthesia Type: General Level of consciousness: awake and alert Pain management: pain level controlled Vital Signs Assessment: post-procedure vital signs reviewed and stable Respiratory status: spontaneous breathing, nonlabored ventilation, respiratory function stable and patient connected to nasal cannula oxygen Cardiovascular status: blood pressure returned to baseline and stable Postop Assessment: no apparent nausea or vomiting Anesthetic complications: no     Last Vitals:  Vitals:   09/03/17 1057 09/03/17 1119  BP: 118/83 123/79  Pulse: 97 87  Resp: 14 14  Temp:  36.7 C  SpO2: 98%     Last Pain:  Vitals:   09/03/17 1119  TempSrc: Oral  PainSc:                  Lenard SimmerAndrew Zenora Karpel

## 2017-09-03 NOTE — Discharge Instructions (Addendum)
1)  The drugs that you have been given will stay in your system until tomorrow so for the       next 24 hours you should not:  A. Drive an automobile  B. Make any legal decisions  C. Drink any alcoholic beverages  2)  You may resume your regular meals upon return home.  3)  A responsible adult must take you home.  Someone should stay with you for a few          hours, then be available by phone for the remainder of the treatment day.  4)  You May experience any of the following symptoms:  Headache, Nausea and a dry mouth (due to the medications you were given),  temporary memory loss and some confusion, or sore muscles (a warm bath  should help this).  If you you experience any of these symptoms let us know on                your return visit.  5)  Report any of the following: any acute discomfort, severe headache, or temperature        greater than 100.5 F.   Also report any unusual redness, swelling, drainage, or pain         at your IV site.    You may report Symptoms to:  ECT [facility]- [place] [facility]          Phone: [phone], ECT Department           or Dr. Shary Keylapac's office [phone]  6)  Your next ECT Treatment is Wednesday February 27 at 9:00  We will call 2 days prior to your scheduled appointment for arrival times.  7)  Nothing to eat or drink after midnight the night before your procedure.  8)  Take all morning meds.   With a sip of water the morning of your procedure.  9)  Other Instructions: Call [phone] to cancel the morning of your procedure due         to illness or emergency.  10) We will call within 72 hours to assess how you are feeling.

## 2017-09-03 NOTE — H&P (Signed)
Jonathan Alexander is an 32 y.o. male.   Chief Complaint: Patient has no new complaints. HPI: Schizoaffective disorder with a history of severe mood instability which has been stabilized for over a year now with ECT treatment.  Past Medical History:  Diagnosis Date  . Schizophrenia (HCC)   . Tachycardia     History reviewed. No pertinent surgical history.  Family History  Family history unknown: Yes   Social History:  reports that he has quit smoking. His smoking use included cigarettes. He quit after 4.00 years of use. he has never used smokeless tobacco. He reports that he does not drink alcohol or use drugs.  Allergies:  Allergies  Allergen Reactions  . Divalproex Sodium   . Shellfish-Derived Products      (Not in a hospital admission)  No results found for this or any previous visit (from the past 48 hour(s)). No results found.  Review of Systems  Constitutional: Negative.   HENT: Negative.   Eyes: Negative.   Respiratory: Negative.   Cardiovascular: Negative.   Gastrointestinal: Negative.   Musculoskeletal: Negative.   Skin: Negative.   Neurological: Negative.   Psychiatric/Behavioral: Negative.     Blood pressure 139/87, pulse 88, temperature 97.7 F (36.5 C), temperature source Oral, height 5\' 11"  (1.803 m), weight 97.1 kg (214 lb), SpO2 100 %. Physical Exam  Nursing note and vitals reviewed. Constitutional: He appears well-developed and well-nourished.  HENT:  Head: Normocephalic and atraumatic.  Eyes: Conjunctivae are normal. Pupils are equal, round, and reactive to light.  Neck: Normal range of motion.  Cardiovascular: Regular rhythm and normal heart sounds.  Respiratory: Effort normal. No respiratory distress.  GI: Soft.  Musculoskeletal: Normal range of motion.  Neurological: He is alert.  Skin: Skin is warm and dry.  Psychiatric: He has a normal mood and affect. His behavior is normal. Judgment and thought content normal.      Assessment/Plan Treatment today and follow-up in 1 month.  This interval seems to be doing well and keeping him stable without any return of serious symptoms or threat of hospitalization.  Mordecai RasmussenJohn Otho Michalik, MD 09/03/2017, 10:11 AM

## 2017-09-03 NOTE — Procedures (Signed)
ECT SERVICES Physician's Interval Evaluation & Treatment Note  Patient Identification: Jonathan FriendlyDemetrio Alexander MRN:  [id number] Date of Evaluation:  09/03/2017 TX #: 16  MADRS:   MMSE:   P.E. Findings:  No change to physical exam heart and lungs normal vitals unremarkable  Psychiatric Interval Note:  Affect and mood unchanged no new complaints no behavior problems  Subjective:  Patient is a 32 y.o. male seen for evaluation for Electroconvulsive Therapy. No specific complaints  Treatment Summary:   []   Right Unilateral             [x]  Bilateral   % Energy : 1.0 ms 100%   Impedance: 590 ohms  Seizure Energy Index: 11,382 V squared  Postictal Suppression Index: No reading but looked pretty good  Seizure Concordance Index: 96%  Medications  Pre Shock: Robinul 0.2 mg Brevital 70 mg succinylcholine 100 mg  Post Shock: Versed 1 mg  Seizure Duration: 28 seconds by EMG and 28 seconds by EEG   Comments: Follow-up in 4 weeks  Lungs:  [x]   Clear to auscultation               []  Other:   Heart:    [x]   Regular rhythm             []  irregular rhythm    [x]   Previous H&P reviewed, patient examined and there are NO CHANGES                 []   Previous H&P reviewed, patient examined and there are changes noted.   Jonathan RasmussenJohn Prerana Strayer, MD 1/30/201910:13 AM

## 2017-09-19 ENCOUNTER — Telehealth: Payer: Self-pay | Admitting: *Deleted

## 2017-09-26 DIAGNOSIS — Z79899 Other long term (current) drug therapy: Secondary | ICD-10-CM | POA: Diagnosis not present

## 2017-09-29 ENCOUNTER — Telehealth: Payer: Self-pay | Admitting: *Deleted

## 2017-09-30 ENCOUNTER — Other Ambulatory Visit: Payer: Self-pay | Admitting: Psychiatry

## 2017-10-01 ENCOUNTER — Encounter: Payer: Self-pay | Admitting: Anesthesiology

## 2017-10-01 ENCOUNTER — Encounter
Admission: RE | Admit: 2017-10-01 | Discharge: 2017-10-01 | Disposition: A | Discharge status: Home / Self Care | Payer: Medicare Other | Source: Ambulatory Visit | Attending: Psychiatry | Admitting: Psychiatry

## 2017-10-01 DIAGNOSIS — F209 Schizophrenia, unspecified: Secondary | ICD-10-CM | POA: Diagnosis not present

## 2017-10-01 DIAGNOSIS — Z87891 Personal history of nicotine dependence: Secondary | ICD-10-CM | POA: Insufficient documentation

## 2017-10-01 DIAGNOSIS — F251 Schizoaffective disorder, depressive type: Secondary | ICD-10-CM | POA: Diagnosis not present

## 2017-10-01 DIAGNOSIS — F259 Schizoaffective disorder, unspecified: Secondary | ICD-10-CM | POA: Insufficient documentation

## 2017-10-01 DIAGNOSIS — R000 Tachycardia, unspecified: Secondary | ICD-10-CM | POA: Diagnosis not present

## 2017-10-01 LAB — BASIC METABOLIC PANEL
ANION GAP: 6 (ref 5–15)
BUN: 12 mg/dL (ref 6–20)
CO2: 26 mmol/L (ref 22–32)
Calcium: 9.2 mg/dL (ref 8.9–10.3)
Chloride: 104 mmol/L (ref 101–111)
Creatinine, Ser: 1 mg/dL (ref 0.61–1.24)
GFR calc Af Amer: >60 mL/min (ref >60)
GLUCOSE: 104 mg/dL — AB (ref 65–99)
POTASSIUM: 4.3 mmol/L (ref 3.5–5.1)
Sodium: 136 mmol/L (ref 135–145)

## 2017-10-01 LAB — LITHIUM LEVEL: Lithium Lvl: 0.77 mmol/L (ref 0.60–1.20)

## 2017-10-01 MED ORDER — GLYCOPYRROLATE 0.2 MG/ML IJ SOLN
0.1000 mg | Freq: Once | INTRAMUSCULAR | Status: AC
  Administered 2017-10-01: 0.1 mg via INTRAVENOUS

## 2017-10-01 MED ORDER — MIDAZOLAM HCL 2 MG/2ML IJ SOLN
INTRAMUSCULAR | Status: AC
  Filled 2017-10-01: qty 2

## 2017-10-01 MED ORDER — MIDAZOLAM HCL 2 MG/2ML IJ SOLN
INTRAMUSCULAR | Status: DC | PRN
  Administered 2017-10-01: 1 mg via INTRAVENOUS

## 2017-10-01 MED ORDER — SODIUM CHLORIDE 0.9 % IV SOLN
INTRAVENOUS | Status: DC | PRN
  Administered 2017-10-01: 11:00:00 via INTRAVENOUS

## 2017-10-01 MED ORDER — KETAMINE HCL 10 MG/ML IJ SOLN
INTRAMUSCULAR | Status: DC | PRN
  Administered 2017-10-01: 70 mg via INTRAVENOUS

## 2017-10-01 MED ORDER — MIDAZOLAM HCL 2 MG/2ML IJ SOLN: 1.0000 mg | Freq: Once | INTRAMUSCULAR | Status: DC

## 2017-10-01 MED ORDER — SODIUM CHLORIDE 0.9 % IV SOLN
500.0000 mL | Freq: Once | INTRAVENOUS | Status: AC
  Administered 2017-10-01: 500 mL via INTRAVENOUS

## 2017-10-01 MED ORDER — SUCCINYLCHOLINE CHLORIDE 200 MG/10ML IV SOSY
PREFILLED_SYRINGE | INTRAVENOUS | Status: DC | PRN
  Administered 2017-10-01: 100 mg via INTRAVENOUS

## 2017-10-01 MED ORDER — SUCCINYLCHOLINE CHLORIDE 20 MG/ML IJ SOLN
INTRAMUSCULAR | Status: AC
  Filled 2017-10-01: qty 1

## 2017-10-01 MED ORDER — GLYCOPYRROLATE 0.2 MG/ML IJ SOLN
INTRAMUSCULAR | Status: AC
  Administered 2017-10-01: 0.1 mg via INTRAVENOUS
  Filled 2017-10-01: qty 1

## 2017-10-01 MED ORDER — METHOHEXITAL SODIUM 0.5 G IJ SOLR
INTRAMUSCULAR | Status: AC
  Filled 2017-10-01: qty 500

## 2017-10-01 NOTE — Procedures (Signed)
ECT SERVICES Physician's Interval Evaluation & Treatment Note  Patient Identification: Jonathan Alexander MRN:  [id number] Date of Evaluation:  10/01/2017 TX #: 17  MADRS: 13  MMSE: 25  P.E. Findings:  No change physical exam  Psychiatric Interval Note:  Blunted and slightly paranoid as usual.  Mood stable.  No behavior change.  Subjective:  Patient is a 32 y.o. male seen for evaluation for Electroconvulsive Therapy. No complaint except for urinating a lot.  His chemistry panel was normal lithium level pending  Treatment Summary:   []   Right Unilateral             [x]  Bilateral   % Energy : 1.0 ms 100%   Impedance: 1250 ohms  Seizure Energy Index: 13,748 V squared  Postictal Suppression Index: 88%  Seizure Concordance Index: 96%  Medications  Pre Shock: Robinul 0.2 mg Brevital 70 mg succinylcholine 100 mg  Post Shock: Versed 1 mg  Seizure Duration: 26 seconds EMG 27 seconds EEG   Comments: Follow-up 4 weeks  Lungs:  [x]   Clear to auscultation               []  Other:   Heart:    [x]   Regular rhythm             []  irregular rhythm    [x]   Previous H&P reviewed, patient examined and there are NO CHANGES                 []   Previous H&P reviewed, patient examined and there are changes noted.   Jonathan RasmussenJohn Clapacs, MD 2/27/201911:05 AM

## 2017-10-01 NOTE — H&P (Signed)
Jonathan Alexander is an 32 y.o. male.   Chief Complaint: Patient mentions that he is urinating a lot.  Mood is stable.  Vague paranoia but no evident hostility or change in behavior HPI: History of schizoaffective disorder has been stable with medicine and maintenance treatment  Past Medical History:  Diagnosis Date  . Schizophrenia ([place])   . Tachycardia     History reviewed. No pertinent surgical history.  Family History  Family history unknown: Yes   Social History:  reports that he has quit smoking. His smoking use included cigarettes. He quit after 4.00 years of use. he has never used smokeless tobacco. He reports that he does not drink alcohol or use drugs.  Allergies:  Allergies  Allergen Reactions  . Divalproex Sodium   . Shellfish-Derived Products      (Not in a hospital admission)  Results for orders placed or performed during the hospital encounter of 10/01/17 (from the past 48 hour(s))  Basic metabolic panel     Status: Abnormal   Collection Time: 10/01/17 10:22 AM  Result Value Ref Range   Sodium 136 135 - 145 mmol/L   Potassium 4.3 3.5 - 5.1 mmol/L   Chloride 104 101 - 111 mmol/L   CO2 26 22 - 32 mmol/L   Glucose, Bld 104 (H) 65 - 99 mg/dL   BUN 12 6 - 20 mg/dL   Creatinine, Ser 1.00 0.61 - 1.24 mg/dL   Calcium 9.2 8.9 - 10.3 mg/dL   GFR calc non Af Amer >60 >60 mL/min   GFR calc Af Amer >60 >60 mL/min    Comment: (NOTE) The eGFR has been calculated using the CKD EPI equation. This calculation has not been validated in all clinical situations. eGFR's persistently <60 mL/min signify possible Chronic Kidney Disease.    Anion gap 6 5 - 15    Comment: Performed at [facility], [address] [place], [place] [address]   No results found.  Review of Systems  Constitutional: Negative.   HENT: Negative.   Eyes: Negative.   Respiratory: Negative.   Cardiovascular: Negative.   Gastrointestinal: Negative.   Genitourinary: Positive for  frequency.  Musculoskeletal: Negative.   Skin: Negative.   Neurological: Negative.   Psychiatric/Behavioral: Negative for depression, hallucinations, memory loss, substance abuse and suicidal ideas. The patient is not nervous/anxious and does not have insomnia.     Blood pressure 137/87, pulse 91, temperature 97.8 F (36.6 C), temperature source Oral, height 5' 11"  (1.803 m), weight 98.4 kg (217 lb). Physical Exam  Nursing note and vitals reviewed. Constitutional: He appears well-developed and well-nourished.  HENT:  Head: Normocephalic and atraumatic.  Eyes: Conjunctivae are normal. Pupils are equal, round, and reactive to light.  Neck: Normal range of motion.  Cardiovascular: Regular rhythm and normal heart sounds.  Respiratory: Effort normal. No respiratory distress.  GI: Soft.  Musculoskeletal: Normal range of motion.  Neurological: He is alert.  Skin: Skin is warm and dry.  Psychiatric: Judgment normal. His affect is blunt. His speech is delayed. He is slowed. Thought content is paranoid. Cognition and memory are impaired. He expresses no suicidal ideation.     Assessment/Plan Checked a basic chemistry panel today which is normal except for very slightly elevated sugar of no significance.  Lithium level still pending.  Vitals stable.  Treatment today follow-up 6 weeks  Alethia Berthold, MD 10/01/2017, 11:03 AM

## 2017-10-01 NOTE — Anesthesia Preprocedure Evaluation (Signed)
Anesthesia Evaluation  Patient identified by MRN, date of birth, ID band Patient awake    Reviewed: Allergy & Precautions, NPO status , Patient's Chart, lab work & pertinent test results, reviewed documented beta blocker date and time   History of Anesthesia Complications Negative for: history of anesthetic complications  Airway Mallampati: II  TM Distance: >3 FB     Dental  (+) Chipped   Pulmonary neg sleep apnea, neg COPD, former smoker,    breath sounds clear to auscultation- rhonchi (-) wheezing      Cardiovascular Exercise Tolerance: Good (-) hypertension(-) CAD, (-) Past MI and (-) Cardiac Stents  Rhythm:Regular Rate:Normal - Systolic murmurs and - Diastolic murmurs    Neuro/Psych PSYCHIATRIC DISORDERS negative neurological ROS     GI/Hepatic negative GI ROS, Neg liver ROS,   Endo/Other  negative endocrine ROSneg diabetes  Renal/GU negative Renal ROS     Musculoskeletal negative musculoskeletal ROS (+)   Abdominal (+) + obese,   Peds  Hematology negative hematology ROS (+)   Anesthesia Other Findings Past Medical History: No date: Schizophrenia (HCC) No date: Tachycardia   Reproductive/Obstetrics                             Anesthesia Physical  Anesthesia Plan  ASA: III  Anesthesia Plan: General   Post-op Pain Management:    Induction: Intravenous  PONV Risk Score and Plan: 2 and Ondansetron  Airway Management Planned: Mask  Additional Equipment:   Intra-op Plan:   Post-operative Plan:   Informed Consent: I have reviewed the patients History and Physical, chart, labs and discussed the procedure including the risks, benefits and alternatives for the proposed anesthesia with the patient or authorized representative who has indicated his/her understanding and acceptance.     Plan Discussed with: CRNA and Anesthesiologist  Anesthesia Plan Comments:          Anesthesia Quick Evaluation  

## 2017-10-01 NOTE — Discharge Instructions (Signed)
1)  The drugs that you have been given will stay in your system until tomorrow so for the       next 24 hours you should not:  A. Drive an automobile  B. Make any legal decisions  C. Drink any alcoholic beverages  2)  You may resume your regular meals upon return home.  3)  A responsible adult must take you home.  Someone should stay with you for a few          hours, then be available by phone for the remainder of the treatment day.  4)  You May experience any of the following symptoms:  Headache, Nausea and a dry mouth (due to the medications you were given),  temporary memory loss and some confusion, or sore muscles (a warm bath  should help this).  If you you experience any of these symptoms let us know on                your return visit.  5)  Report any of the following: any acute discomfort, severe headache, or temperature        greater than 100.5 F.   Also report any unusual redness, swelling, drainage, or pain         at your IV site.    You may report Symptoms to:  [facility] [place] [facility]          Phone: [phone], ECT Department           or Dr. Shary Keylapac's office [phone]  6)  Your next ECT Treatment is Day Wednesday Date November 12, 2017  We will call 2 days prior to your scheduled appointment for arrival times.  7)  Nothing to eat or drink after midnight the night before your procedure.  8)  Take all morning meds     With a sip of water the morning of your procedure.  9)  Other Instructions: Call [phone] to cancel the morning of your procedure due         to illness or emergency.  10) We will call within 72 hours to assess how you are feeling.

## 2017-10-01 NOTE — Anesthesia Post-op Follow-up Note (Signed)
Anesthesia QCDR form completed.        

## 2017-10-01 NOTE — Anesthesia Procedure Notes (Signed)
Date/Time: 10/01/2017 11:11 AM Performed by: Lily KocherPeralta, Symon Norwood, CRNA Pre-anesthesia Checklist: Patient identified, Emergency Drugs available, Suction available and Patient being monitored Patient Re-evaluated:Patient Re-evaluated prior to induction Oxygen Delivery Method: Circle system utilized Preoxygenation: Pre-oxygenation with 100% oxygen Induction Type: IV induction Ventilation: Mask ventilation without difficulty and Mask ventilation throughout procedure Airway Equipment and Method: Bite block Placement Confirmation: positive ETCO2 Dental Injury: Teeth and Oropharynx as per pre-operative assessment

## 2017-10-01 NOTE — Transfer of Care (Signed)
Immediate Anesthesia Transfer of Care Note  Patient: Jonathan Alexander  Procedure(s) Performed: ECT TX  Patient Location: PACU  Anesthesia Type:General  Level of Consciousness: sedated  Airway & Oxygen Therapy: Patient Spontanous Breathing and Patient connected to face mask oxygen  Post-op Assessment: Report given to RN and Post -op Vital signs reviewed and stable  Post vital signs: Reviewed and stable  Last Vitals:  Vitals:   10/01/17 0922 10/01/17 1118  BP: 137/87 131/87  Pulse: 91 95  Temp: 36.6 C 37.3 C    Last Pain:  Vitals:   10/01/17 1118  TempSrc: Temporal  PainSc: Asleep         Complications: No apparent anesthesia complications

## 2017-10-01 NOTE — Anesthesia Postprocedure Evaluation (Signed)
Anesthesia Post Note  Patient: Jonathan Alexander  Procedure(s) Performed: ECT TX  Patient location during evaluation: PACU Anesthesia Type: General Level of consciousness: awake and alert Pain management: pain level controlled Vital Signs Assessment: post-procedure vital signs reviewed and stable Respiratory status: spontaneous breathing, nonlabored ventilation, respiratory function stable and patient connected to nasal cannula oxygen Cardiovascular status: blood pressure returned to baseline and stable Postop Assessment: no apparent nausea or vomiting Anesthetic complications: no     Last Vitals:  Vitals:   10/01/17 1149 10/01/17 1159  BP: 124/88 116/70  Pulse: 94 94  Resp: 10 16  Temp: 37.3 C   SpO2: 100%     Last Pain:  Vitals:   10/01/17 1130  TempSrc:   PainSc: Asleep                 Lenard SimmerAndrew Paw Karstens

## 2017-10-13 DIAGNOSIS — E669 Obesity, unspecified: Secondary | ICD-10-CM | POA: Diagnosis not present

## 2017-10-13 DIAGNOSIS — D649 Anemia, unspecified: Secondary | ICD-10-CM | POA: Diagnosis not present

## 2017-10-13 DIAGNOSIS — J301 Allergic rhinitis due to pollen: Secondary | ICD-10-CM | POA: Diagnosis not present

## 2017-10-13 DIAGNOSIS — F259 Schizoaffective disorder, unspecified: Secondary | ICD-10-CM | POA: Diagnosis not present

## 2017-10-13 DIAGNOSIS — K5900 Constipation, unspecified: Secondary | ICD-10-CM | POA: Diagnosis not present

## 2017-10-13 DIAGNOSIS — R748 Abnormal levels of other serum enzymes: Secondary | ICD-10-CM | POA: Diagnosis not present

## 2017-10-13 DIAGNOSIS — R000 Tachycardia, unspecified: Secondary | ICD-10-CM | POA: Diagnosis not present

## 2017-10-13 DIAGNOSIS — L272 Dermatitis due to ingested food: Secondary | ICD-10-CM | POA: Diagnosis not present

## 2017-10-20 DIAGNOSIS — Z79899 Other long term (current) drug therapy: Secondary | ICD-10-CM | POA: Diagnosis not present

## 2017-11-03 ENCOUNTER — Telehealth: Payer: Self-pay | Admitting: *Deleted

## 2017-11-10 ENCOUNTER — Telehealth: Payer: Self-pay

## 2017-11-12 ENCOUNTER — Encounter
Admission: RE | Admit: 2017-11-12 | Discharge: 2017-11-12 | Disposition: A | Discharge status: Home / Self Care | Payer: Medicare Other | Source: Ambulatory Visit | Attending: Psychiatry | Admitting: Psychiatry

## 2017-11-12 ENCOUNTER — Encounter: Payer: Self-pay | Admitting: Anesthesiology

## 2017-11-12 ENCOUNTER — Other Ambulatory Visit: Payer: Self-pay | Admitting: Psychiatry

## 2017-11-12 DIAGNOSIS — F200 Paranoid schizophrenia: Secondary | ICD-10-CM | POA: Diagnosis not present

## 2017-11-12 DIAGNOSIS — F250 Schizoaffective disorder, bipolar type: Secondary | ICD-10-CM

## 2017-11-12 DIAGNOSIS — F259 Schizoaffective disorder, unspecified: Secondary | ICD-10-CM | POA: Insufficient documentation

## 2017-11-12 MED ORDER — MIDAZOLAM HCL 2 MG/2ML IJ SOLN
INTRAMUSCULAR | Status: DC | PRN
  Administered 2017-11-12: 1 mg via INTRAVENOUS

## 2017-11-12 MED ORDER — MIDAZOLAM HCL 2 MG/2ML IJ SOLN: 1.0000 mg | Freq: Once | INTRAMUSCULAR | Status: DC

## 2017-11-12 MED ORDER — SUCCINYLCHOLINE CHLORIDE 20 MG/ML IJ SOLN
INTRAMUSCULAR | Status: AC
  Filled 2017-11-12: qty 1

## 2017-11-12 MED ORDER — GLYCOPYRROLATE 0.2 MG/ML IJ SOLN
0.2000 mg | Freq: Once | INTRAMUSCULAR | Status: AC
  Administered 2017-11-12: 0.2 mg via INTRAVENOUS

## 2017-11-12 MED ORDER — SODIUM CHLORIDE 0.9 % IV SOLN
INTRAVENOUS | Status: DC | PRN
  Administered 2017-11-12: 10:00:00 via INTRAVENOUS

## 2017-11-12 MED ORDER — METHOHEXITAL SODIUM 100 MG/10ML IV SOSY
PREFILLED_SYRINGE | INTRAVENOUS | Status: DC | PRN
  Administered 2017-11-12: 70 mg via INTRAVENOUS

## 2017-11-12 MED ORDER — ONDANSETRON HCL 4 MG/2ML IJ SOLN: 4.0000 mg | Freq: Once | INTRAMUSCULAR | Status: DC | PRN

## 2017-11-12 MED ORDER — SODIUM CHLORIDE 0.9 % IV SOLN
500.0000 mL | Freq: Once | INTRAVENOUS | Status: AC
  Administered 2017-11-12: 500 mL via INTRAVENOUS

## 2017-11-12 MED ORDER — MIDAZOLAM HCL 2 MG/2ML IJ SOLN
INTRAMUSCULAR | Status: AC
  Filled 2017-11-12: qty 2

## 2017-11-12 MED ORDER — GLYCOPYRROLATE 0.2 MG/ML IJ SOLN
INTRAMUSCULAR | Status: AC
  Filled 2017-11-12: qty 1

## 2017-11-12 MED ORDER — SUCCINYLCHOLINE CHLORIDE 200 MG/10ML IV SOSY
PREFILLED_SYRINGE | INTRAVENOUS | Status: DC | PRN
  Administered 2017-11-12: 100 mg via INTRAVENOUS

## 2017-11-12 NOTE — Anesthesia Postprocedure Evaluation (Signed)
Anesthesia Post Note  Patient: Jonathan Alexander  Procedure(s) Performed: ECT TX  Patient location during evaluation: PACU Anesthesia Type: General Level of consciousness: awake and alert Pain management: pain level controlled Vital Signs Assessment: post-procedure vital signs reviewed and stable Respiratory status: spontaneous breathing and respiratory function stable Cardiovascular status: stable Anesthetic complications: no     Last Vitals:  Vitals:   11/12/17 1056 11/12/17 1106  BP: 126/80 124/79  Pulse: 99 98  Resp: 12 14  Temp:  36.9 C  SpO2: 100% 100%    Last Pain:  Vitals:   11/12/17 1106  TempSrc:   PainSc: 0-No pain                 Destaney Sarkis K

## 2017-11-12 NOTE — H&P (Signed)
Jonathan Alexander is an 32 y.o. male.   Chief Complaint: No new complaint HPI: History of schizoaffective disorder who is stable with intermittent ECT as part of the treatment  Past Medical History:  Diagnosis Date  . Schizophrenia (HCC)   . Tachycardia     History reviewed. No pertinent surgical history.  Family History  Family history unknown: Yes   Social History:  reports that he has quit smoking. His smoking use included cigarettes. He quit after 4.00 years of use. He has never used smokeless tobacco. He reports that he does not drink alcohol or use drugs.  Allergies:  Allergies  Allergen Reactions  . Divalproex Sodium   . Shellfish-Derived Products      (Not in a hospital admission)  No results found for this or any previous visit (from the past 48 hour(s)). No results found.  Review of Systems  Constitutional: Negative.   HENT: Negative.   Eyes: Negative.   Respiratory: Negative.   Cardiovascular: Negative.   Gastrointestinal: Negative.   Musculoskeletal: Negative.   Skin: Negative.   Neurological: Negative.   Psychiatric/Behavioral: Negative.     Blood pressure 133/89, pulse 91, temperature 97.6 F (36.4 C), temperature source Oral, resp. rate 17, height 5\' 10"  (1.778 m), weight 96.6 kg (213 lb), SpO2 100 %. Physical Exam  Nursing note and vitals reviewed. Constitutional: He appears well-developed and well-nourished.  HENT:  Head: Normocephalic and atraumatic.  Eyes: Pupils are equal, round, and reactive to light. Conjunctivae are normal.  Neck: Normal range of motion.  Cardiovascular: Regular rhythm and normal heart sounds.  Respiratory: Effort normal.  GI: Soft.  Musculoskeletal: Normal range of motion.  Neurological: He is alert.  Skin: Skin is warm and dry.  Psychiatric: Judgment normal. His affect is blunt. His speech is delayed. He is slowed. Cognition and memory are impaired. He expresses no homicidal and no suicidal ideation.      Assessment/Plan Follow-up in 6 weeks no change to treatment plan  Jonathan RasmussenJohn Lavana Huckeba, MD 11/12/2017, 10:15 AM

## 2017-11-12 NOTE — Anesthesia Post-op Follow-up Note (Signed)
Anesthesia QCDR form completed.        

## 2017-11-12 NOTE — Transfer of Care (Signed)
Immediate Anesthesia Transfer of Care Note  Patient: Jonathan Alexander  Procedure(s) Performed: ECT TX  Patient Location: PACU  Anesthesia Type:General  Level of Consciousness: drowsy  Airway & Oxygen Therapy: Patient Spontanous Breathing and Patient connected to face mask oxygen  Post-op Assessment: Report given to RN and Post -op Vital signs reviewed and stable  Post vital signs: Reviewed and stable  Last Vitals:  Vitals Value Taken Time  BP 127/83 11/12/2017 10:36 AM  Temp 36.8 C 11/12/2017 10:36 AM  Pulse 96 11/12/2017 10:36 AM  Resp 13 11/12/2017 10:36 AM  SpO2 100 % 11/12/2017 10:36 AM  Vitals shown include unvalidated device data.  Last Pain:  Vitals:   11/12/17 0907  TempSrc: Oral         Complications: No apparent anesthesia complications

## 2017-11-12 NOTE — Procedures (Signed)
ECT SERVICES Physician's Interval Evaluation & Treatment Note  Patient Identification: Jonathan Alexander MRN:  [id number] Date of Evaluation:  11/12/2017 TX #: 18  MADRS:   MMSE:   P.E. Findings:  No change to physical exam.  Stable.  Vitals unremarkable  Psychiatric Interval Note:  Flat blunted no complaints stable  Subjective:  Patient is a 32 y.o. male seen for evaluation for Electroconvulsive Therapy. No complaints.  Treatment Summary:   []   Right Unilateral             [x]  Bilateral   % Energy : 1.0 ms 100%   Impedance: 1040 ohms  Seizure Energy Index: 13,152 V squared  Postictal Suppression Index: 93%  Seizure Concordance Index: 97%  Medications  Pre Shock: Robinul 0.2 mg Brevital 70 mg succinylcholine 100 mg  Post Shock: Versed 1 mg  Seizure Duration: 29 seconds EMG 32 seconds EEG   Comments: Follow-up in about 6 weeks May 22  Lungs:  [x]   Clear to auscultation               []  Other:   Heart:    [x]   Regular rhythm             []  irregular rhythm    [x]   Previous H&P reviewed, patient examined and there are NO CHANGES                 []   Previous H&P reviewed, patient examined and there are changes noted.   Jonathan RasmussenJohn Sanuel Ladnier, MD 4/10/201910:17 AM

## 2017-11-12 NOTE — Discharge Instructions (Signed)
1)  The drugs that you have been given will stay in your system until tomorrow so for the       next 24 hours you should not:  A. Drive an automobile  B. Make any legal decisions  C. Drink any alcoholic beverages  2)  You may resume your regular meals upon return home.  3)  A responsible adult must take you home.  Someone should stay with you for a few          hours, then be available by phone for the remainder of the treatment day.  4)  You May experience any of the following symptoms:  Headache, Nausea and a dry mouth (due to the medications you were given),  temporary memory loss and some confusion, or sore muscles (a warm bath  should help this).  If you you experience any of these symptoms let us know on                your return visit.  5)  Report any of the following: any acute discomfort, severe headache, or temperature        greater than 100.5 F.   Also report any unusual redness, swelling, drainage, or pain         at your IV site.    You may report Symptoms to:  ECT [facility]- [place] [facility]          Phone: [phone], ECT Department           or Dr. Shary Keylapac's office [phone]  6)  Your next ECT Treatment is Day Monday Date January 24, 2018  We will call 2 days prior to your scheduled appointment for arrival times.  7)  Nothing to eat or drink after midnight the night before your procedure.  8)  Take all morning meds     With a sip of water the morning of your procedure.  9)  Other Instructions: Call [phone] to cancel the morning of your procedure due         to illness or emergency.  10) We will call within 72 hours to assess how you are feeling.

## 2017-11-12 NOTE — Anesthesia Preprocedure Evaluation (Signed)
Anesthesia Evaluation  Patient identified by MRN, date of birth, ID band Patient awake    Reviewed: Allergy & Precautions, NPO status , Patient's Chart, lab work & pertinent test results, reviewed documented beta blocker date and time   History of Anesthesia Complications Negative for: history of anesthetic complications  Airway Mallampati: II  TM Distance: >3 FB     Dental  (+) Chipped   Pulmonary neg sleep apnea, neg COPD, former smoker,    breath sounds clear to auscultation- rhonchi (-) wheezing      Cardiovascular Exercise Tolerance: Good (-) hypertension(-) CAD, (-) Past MI and (-) Cardiac Stents  Rhythm:Regular Rate:Normal - Systolic murmurs and - Diastolic murmurs    Neuro/Psych PSYCHIATRIC DISORDERS Schizophrenia negative neurological ROS     GI/Hepatic negative GI ROS, Neg liver ROS,   Endo/Other  negative endocrine ROSneg diabetes  Renal/GU negative Renal ROS     Musculoskeletal negative musculoskeletal ROS (+)   Abdominal (+) + obese,   Peds  Hematology negative hematology ROS (+)   Anesthesia Other Findings Past Medical History: No date: Schizophrenia (HCC) No date: Tachycardia   Reproductive/Obstetrics                            Anesthesia Physical Anesthesia Plan  ASA: III  Anesthesia Plan: General   Post-op Pain Management:    Induction: Intravenous  PONV Risk Score and Plan: 2 and Ondansetron  Airway Management Planned: Mask  Additional Equipment:   Intra-op Plan:   Post-operative Plan:   Informed Consent: I have reviewed the patients History and Physical, chart, labs and discussed the procedure including the risks, benefits and alternatives for the proposed anesthesia with the patient or authorized representative who has indicated his/her understanding and acceptance.     Plan Discussed with: CRNA and Anesthesiologist  Anesthesia Plan Comments:         Anesthesia Quick Evaluation  

## 2017-11-12 NOTE — Anesthesia Procedure Notes (Signed)
Performed by: Keimani Laufer, CRNA Pre-anesthesia Checklist: Patient identified, Emergency Drugs available, Suction available and Patient being monitored Patient Re-evaluated:Patient Re-evaluated prior to induction Oxygen Delivery Method: Circle system utilized Preoxygenation: Pre-oxygenation with 100% oxygen Induction Type: IV induction Ventilation: Mask ventilation without difficulty and Mask ventilation throughout procedure Airway Equipment and Method: Bite block Placement Confirmation: positive ETCO2 Dental Injury: Teeth and Oropharynx as per pre-operative assessment        

## 2017-11-14 DIAGNOSIS — K5900 Constipation, unspecified: Secondary | ICD-10-CM | POA: Diagnosis not present

## 2017-11-14 DIAGNOSIS — D649 Anemia, unspecified: Secondary | ICD-10-CM | POA: Diagnosis not present

## 2017-11-14 DIAGNOSIS — R748 Abnormal levels of other serum enzymes: Secondary | ICD-10-CM | POA: Diagnosis not present

## 2017-11-14 DIAGNOSIS — L272 Dermatitis due to ingested food: Secondary | ICD-10-CM | POA: Diagnosis not present

## 2017-11-14 DIAGNOSIS — E669 Obesity, unspecified: Secondary | ICD-10-CM | POA: Diagnosis not present

## 2017-11-14 DIAGNOSIS — J301 Allergic rhinitis due to pollen: Secondary | ICD-10-CM | POA: Diagnosis not present

## 2017-11-14 DIAGNOSIS — F259 Schizoaffective disorder, unspecified: Secondary | ICD-10-CM | POA: Diagnosis not present

## 2017-11-14 DIAGNOSIS — R000 Tachycardia, unspecified: Secondary | ICD-10-CM | POA: Diagnosis not present

## 2017-12-16 DIAGNOSIS — Z79899 Other long term (current) drug therapy: Secondary | ICD-10-CM | POA: Diagnosis not present

## 2017-12-22 ENCOUNTER — Telehealth: Payer: Self-pay

## 2017-12-23 DIAGNOSIS — D649 Anemia, unspecified: Secondary | ICD-10-CM | POA: Diagnosis not present

## 2017-12-23 DIAGNOSIS — R748 Abnormal levels of other serum enzymes: Secondary | ICD-10-CM | POA: Diagnosis not present

## 2017-12-23 DIAGNOSIS — J301 Allergic rhinitis due to pollen: Secondary | ICD-10-CM | POA: Diagnosis not present

## 2017-12-23 DIAGNOSIS — E669 Obesity, unspecified: Secondary | ICD-10-CM | POA: Diagnosis not present

## 2017-12-23 DIAGNOSIS — F259 Schizoaffective disorder, unspecified: Secondary | ICD-10-CM | POA: Diagnosis not present

## 2017-12-23 DIAGNOSIS — K5900 Constipation, unspecified: Secondary | ICD-10-CM | POA: Diagnosis not present

## 2017-12-23 DIAGNOSIS — L272 Dermatitis due to ingested food: Secondary | ICD-10-CM | POA: Diagnosis not present

## 2017-12-23 DIAGNOSIS — R000 Tachycardia, unspecified: Secondary | ICD-10-CM | POA: Diagnosis not present

## 2017-12-24 ENCOUNTER — Encounter: Payer: Self-pay | Admitting: Anesthesiology

## 2017-12-24 ENCOUNTER — Encounter
Admission: RE | Admit: 2017-12-24 | Discharge: 2017-12-24 | Disposition: A | Discharge status: Home / Self Care | Payer: Medicare Other | Source: Ambulatory Visit | Attending: Psychiatry | Admitting: Psychiatry

## 2017-12-24 ENCOUNTER — Other Ambulatory Visit: Payer: Self-pay | Admitting: Psychiatry

## 2017-12-24 DIAGNOSIS — F259 Schizoaffective disorder, unspecified: Secondary | ICD-10-CM | POA: Diagnosis not present

## 2017-12-24 DIAGNOSIS — E669 Obesity, unspecified: Secondary | ICD-10-CM | POA: Diagnosis not present

## 2017-12-24 DIAGNOSIS — F209 Schizophrenia, unspecified: Secondary | ICD-10-CM | POA: Diagnosis not present

## 2017-12-24 DIAGNOSIS — F250 Schizoaffective disorder, bipolar type: Secondary | ICD-10-CM | POA: Diagnosis not present

## 2017-12-24 MED ORDER — MIDAZOLAM HCL 2 MG/2ML IJ SOLN: 1.0000 mg | Freq: Once | INTRAMUSCULAR | Status: DC

## 2017-12-24 MED ORDER — SODIUM CHLORIDE 0.9 % IV SOLN
500.0000 mL | Freq: Once | INTRAVENOUS | Status: AC
  Administered 2017-12-24: 500 mL via INTRAVENOUS

## 2017-12-24 MED ORDER — SUCCINYLCHOLINE CHLORIDE 200 MG/10ML IV SOSY
PREFILLED_SYRINGE | INTRAVENOUS | Status: DC | PRN
  Administered 2017-12-24: 100 mg via INTRAVENOUS

## 2017-12-24 MED ORDER — METHOHEXITAL SODIUM 100 MG/10ML IV SOSY
PREFILLED_SYRINGE | INTRAVENOUS | Status: DC | PRN
  Administered 2017-12-24: 70 mg via INTRAVENOUS

## 2017-12-24 MED ORDER — GLYCOPYRROLATE 0.2 MG/ML IJ SOLN
0.2000 mg | Freq: Once | INTRAMUSCULAR | Status: AC
  Administered 2017-12-24: 0.2 mg via INTRAVENOUS

## 2017-12-24 MED ORDER — SODIUM CHLORIDE 0.9 % IV SOLN
INTRAVENOUS | Status: DC | PRN
  Administered 2017-12-24: 10:00:00 via INTRAVENOUS

## 2017-12-24 NOTE — Anesthesia Postprocedure Evaluation (Signed)
Anesthesia Post Note  Patient: Jonathan Alexander  Procedure(s) Performed: ECT TX  Patient location during evaluation: PACU Anesthesia Type: General Level of consciousness: awake and alert Pain management: pain level controlled Vital Signs Assessment: post-procedure vital signs reviewed and stable Respiratory status: spontaneous breathing, nonlabored ventilation, respiratory function stable and patient connected to nasal cannula oxygen Cardiovascular status: blood pressure returned to baseline and stable Postop Assessment: no apparent nausea or vomiting Anesthetic complications: no     Last Vitals:  Vitals:   12/24/17 1111 12/24/17 1122  BP: (!) 137/91 123/80  Pulse: 89 96  Resp:  18  Temp:  36.8 C  SpO2: 100%     Last Pain:  Vitals:   12/24/17 1122  TempSrc: Oral  PainSc:                  Cleda Mccreedy Piscitello

## 2017-12-24 NOTE — Procedures (Signed)
ECT SERVICES Physician's Interval Evaluation & Treatment Note  Patient Identification: Jonathan Alexander MRN:  [id number] Date of Evaluation:  12/24/2017 TX #: 19  MADRS:   MMSE:   P.E. Findings:  No change to physical exam  Psychiatric Interval Note:  Blunt quiet but no complaints  Subjective:  Patient is a 32 y.o. male seen for evaluation for Electroconvulsive Therapy. No specific complaint  Treatment Summary:     Right Unilateral              Bilateral   % Energy : 1.0 ms 100%   Impedance: 1090 ohms  Seizure Energy Index: 11,480 V squared  Postictal Suppression Index: 96%  Seizure Concordance Index: 97%  Medications  Pre Shock: Robinul 0.2 mg Brevital 70 mg succinylcholine 100 mg  Post Shock: Versed 1 mg  Seizure Duration: 31 seconds by EMG 36 seconds by EEG   Comments: No change to overall treatment plan tolerating well follow-up 6 weeks  Lungs:    Clear to auscultation                Other:   Heart:      Regular rhythm              irregular rhythm      Previous H&P reviewed, patient examined and there are NO CHANGES                   Previous H&P reviewed, patient examined and there are changes noted.   [name], MD 5/22/201910:13 AM

## 2017-12-24 NOTE — Transfer of Care (Signed)
Immediate Anesthesia Transfer of Care Note  Patient: Jonathan Alexander  Procedure(s) Performed: ECT TX  Patient Location: PACU  Anesthesia Type:General  Level of Consciousness: sedated  Airway & Oxygen Therapy: Patient Spontanous Breathing and Patient connected to face mask oxygen  Post-op Assessment: Report given to RN and Post -op Vital signs reviewed and stable  Post vital signs: Reviewed and stable  Last Vitals:  Vitals Value Taken Time  BP 139/98 12/24/2017 10:31 AM  Temp 36.8 C 12/24/2017 10:31 AM  Pulse 102 12/24/2017 10:35 AM  Resp 22 12/24/2017 10:33 AM  SpO2 99 % 12/24/2017 10:35 AM  Vitals shown include unvalidated device data.  Last Pain:  Vitals:   12/24/17 1031  TempSrc:   PainSc: 0-No pain         Complications: No apparent anesthesia complications

## 2017-12-24 NOTE — Anesthesia Procedure Notes (Signed)
Date/Time: 12/24/2017 10:22 AM Performed by: Lily Kocher, CRNA Pre-anesthesia Checklist: Patient identified, Emergency Drugs available, Suction available and Patient being monitored Patient Re-evaluated:Patient Re-evaluated prior to induction Oxygen Delivery Method: Circle system utilized Preoxygenation: Pre-oxygenation with 100% oxygen Induction Type: IV induction Ventilation: Mask ventilation without difficulty and Mask ventilation throughout procedure Airway Equipment and Method: Bite block Placement Confirmation: positive ETCO2 Dental Injury: Teeth and Oropharynx as per pre-operative assessment

## 2017-12-24 NOTE — Anesthesia Preprocedure Evaluation (Addendum)
Anesthesia Evaluation  Patient identified by MRN, date of birth, ID band Patient awake    Reviewed: Allergy & Precautions, NPO status , Patient's Chart, lab work & pertinent test results, reviewed documented beta blocker date and time   History of Anesthesia Complications Negative for: history of anesthetic complications  Airway Mallampati: II  TM Distance: >3 FB     Dental  (+) Chipped, Poor Dentition   Pulmonary neg sleep apnea, neg COPD, former smoker,    breath sounds clear to auscultation- rhonchi (-) wheezing      Cardiovascular Exercise Tolerance: Good (-) hypertension(-) CAD, (-) Past MI and (-) Cardiac Stents  Rhythm:Regular Rate:Normal - Systolic murmurs and - Diastolic murmurs    Neuro/Psych PSYCHIATRIC DISORDERS Schizophrenia negative neurological ROS     GI/Hepatic negative GI ROS, Neg liver ROS,   Endo/Other  negative endocrine ROSneg diabetes  Renal/GU negative Renal ROS     Musculoskeletal negative musculoskeletal ROS (+)   Abdominal (+) + obese,   Peds  Hematology negative hematology ROS (+)   Anesthesia Other Findings Past Medical History: No date: Schizophrenia (HCC) No date: Tachycardia   Reproductive/Obstetrics                            Anesthesia Physical  Anesthesia Plan  ASA: III  Anesthesia Plan: General   Post-op Pain Management:    Induction: Intravenous  PONV Risk Score and Plan: 2 and Ondansetron  Airway Management Planned: Mask  Additional Equipment:   Intra-op Plan:   Post-operative Plan:   Informed Consent: I have reviewed the patients History and Physical, chart, labs and discussed the procedure including the risks, benefits and alternatives for the proposed anesthesia with the patient or authorized representative who has indicated his/her understanding and acceptance.   Dental Advisory Given  Plan Discussed with: CRNA and  Anesthesiologist  Anesthesia Plan Comments: (Patient consented for risks of anesthesia including but not limited to:  - adverse reactions to medications - risk of intubation if required - damage to teeth, lips or other oral mucosa - sore throat or hoarseness - Damage to heart, brain, lungs or loss of life  Patient voiced understanding.)        Anesthesia Quick Evaluation

## 2017-12-24 NOTE — H&P (Signed)
Jonathan Alexander is an 32 y.o. male.   Chief Complaint: Patient has no specific new complaint HPI: History of schizoaffective disorder with good maintenance of stability in part thanks to maintenance ECT  Past Medical History:  Diagnosis Date  . Schizophrenia (HCC)   . Tachycardia     History reviewed. No pertinent surgical history.  Family History  Family history unknown: Yes   Social History:  reports that he has quit smoking. His smoking use included cigarettes. He quit after 4.00 years of use. He has never used smokeless tobacco. He reports that he does not drink alcohol or use drugs.  Allergies:  Allergies  Allergen Reactions  . Divalproex Sodium   . Shellfish-Derived Products      (Not in a hospital admission)  No results found for this or any previous visit (from the past 48 hour(s)). No results found.  Review of Systems  Constitutional: Negative.   HENT: Negative.   Eyes: Negative.   Respiratory: Negative.   Cardiovascular: Negative.   Gastrointestinal: Negative.   Musculoskeletal: Negative.   Skin: Negative.   Neurological: Negative.   Psychiatric/Behavioral: Negative.     Blood pressure 132/80, pulse 97, temperature 98.3 F (36.8 C), temperature source Oral, resp. rate 18, height  (1.778 m), weight 94.3 kg (208 lb), SpO2 100 %. Physical Exam  Nursing note and vitals reviewed. Constitutional: He appears well-developed and well-nourished.  HENT:  Head: Normocephalic and atraumatic.  Eyes: Pupils are equal, round, and reactive to light. Conjunctivae are normal.  Neck: Normal range of motion.  Cardiovascular: Regular rhythm and normal heart sounds.  Respiratory: Effort normal. No respiratory distress.  GI: Soft.  Musculoskeletal: Normal range of motion.  Neurological: He is alert.  Skin: Skin is warm and dry.  Psychiatric: Judgment normal. His affect is blunt. His speech is delayed. He is slowed. Thought content is not delusional. Cognition and  memory are normal. He expresses no homicidal and no suicidal ideation.     Assessment/Plan Follow-up in 6 weeks  [name], MD 12/24/2017, 10:12 AM

## 2017-12-24 NOTE — Discharge Instructions (Signed)
1)  The drugs that you have been given will stay in your system until tomorrow so for the       next 24 hours you should not:  A. Drive an automobile  B. Make any legal decisions  C. Drink any alcoholic beverages  2)  You may resume your regular meals upon return home.  3)  A responsible adult must take you home.  Someone should stay with you for a few          hours, then be available by phone for the remainder of the treatment day.  4)  You May experience any of the following symptoms:  Headache, Nausea and a dry mouth (due to the medications you were given),  temporary memory loss and some confusion, or sore muscles (a warm bath  should help this).  If you you experience any of these symptoms let us know on                your return visit.  5)  Report any of the following: any acute discomfort, severe headache, or temperature        greater than 100.5 F.   Also report any unusual redness, swelling, drainage, or pain         at your IV site.    You may report Symptoms to:  ECT PROGRAM- [place] [facility]          Phone: [phone], ECT Department           or Dr. Shary Key office [phone]  6)  Your next ECT Treatment is Wednesday July 3   We will call 2 days prior to your scheduled appointment for arrival times.  7)  Nothing to eat or drink after midnight the night before your procedure.  8)  Take  morning medication   With a sip of water the morning of your procedure.  9)  Other Instructions: Call [phone] to cancel the morning of your procedure due         to illness or emergency.  10) We will call within 72 hours to assess how you are feeling.

## 2017-12-24 NOTE — Anesthesia Post-op Follow-up Note (Signed)
Anesthesia QCDR form completed.        

## 2018-01-09 DIAGNOSIS — E669 Obesity, unspecified: Secondary | ICD-10-CM | POA: Diagnosis not present

## 2018-01-09 DIAGNOSIS — D649 Anemia, unspecified: Secondary | ICD-10-CM | POA: Diagnosis not present

## 2018-01-13 DIAGNOSIS — R748 Abnormal levels of other serum enzymes: Secondary | ICD-10-CM | POA: Diagnosis not present

## 2018-01-13 DIAGNOSIS — R000 Tachycardia, unspecified: Secondary | ICD-10-CM | POA: Diagnosis not present

## 2018-01-13 DIAGNOSIS — E870 Hyperosmolality and hypernatremia: Secondary | ICD-10-CM | POA: Diagnosis not present

## 2018-01-13 DIAGNOSIS — J301 Allergic rhinitis due to pollen: Secondary | ICD-10-CM | POA: Diagnosis not present

## 2018-01-13 DIAGNOSIS — N3944 Nocturnal enuresis: Secondary | ICD-10-CM | POA: Diagnosis not present

## 2018-01-13 DIAGNOSIS — K5900 Constipation, unspecified: Secondary | ICD-10-CM | POA: Diagnosis not present

## 2018-01-13 DIAGNOSIS — F259 Schizoaffective disorder, unspecified: Secondary | ICD-10-CM | POA: Diagnosis not present

## 2018-01-13 DIAGNOSIS — D649 Anemia, unspecified: Secondary | ICD-10-CM | POA: Diagnosis not present

## 2018-01-13 DIAGNOSIS — L272 Dermatitis due to ingested food: Secondary | ICD-10-CM | POA: Diagnosis not present

## 2018-01-13 DIAGNOSIS — E669 Obesity, unspecified: Secondary | ICD-10-CM | POA: Diagnosis not present

## 2018-02-02 ENCOUNTER — Telehealth: Payer: Self-pay | Admitting: *Deleted

## 2018-02-03 ENCOUNTER — Other Ambulatory Visit: Payer: Self-pay | Admitting: Psychiatry

## 2018-02-04 ENCOUNTER — Ambulatory Visit
Admission: RE | Admit: 2018-02-04 | Discharge: 2018-02-04 | Disposition: A | Discharge status: Home / Self Care | Payer: Medicare Other | Source: Ambulatory Visit | Attending: Psychiatry | Admitting: Psychiatry

## 2018-02-04 DIAGNOSIS — Z6831 Body mass index (BMI) 31.0-31.9, adult: Secondary | ICD-10-CM | POA: Insufficient documentation

## 2018-02-04 DIAGNOSIS — Z79899 Other long term (current) drug therapy: Secondary | ICD-10-CM | POA: Insufficient documentation

## 2018-02-04 DIAGNOSIS — Z7984 Long term (current) use of oral hypoglycemic drugs: Secondary | ICD-10-CM | POA: Diagnosis not present

## 2018-02-04 DIAGNOSIS — Z91013 Allergy to seafood: Secondary | ICD-10-CM | POA: Diagnosis not present

## 2018-02-04 DIAGNOSIS — F250 Schizoaffective disorder, bipolar type: Secondary | ICD-10-CM | POA: Diagnosis not present

## 2018-02-04 DIAGNOSIS — E669 Obesity, unspecified: Secondary | ICD-10-CM | POA: Diagnosis not present

## 2018-02-04 DIAGNOSIS — F209 Schizophrenia, unspecified: Secondary | ICD-10-CM | POA: Diagnosis not present

## 2018-02-04 DIAGNOSIS — Z87891 Personal history of nicotine dependence: Secondary | ICD-10-CM | POA: Diagnosis not present

## 2018-02-04 DIAGNOSIS — Z888 Allergy status to other drugs, medicaments and biological substances status: Secondary | ICD-10-CM | POA: Insufficient documentation

## 2018-02-04 DIAGNOSIS — F259 Schizoaffective disorder, unspecified: Secondary | ICD-10-CM | POA: Diagnosis not present

## 2018-02-04 DIAGNOSIS — Z01810 Encounter for preprocedural cardiovascular examination: Secondary | ICD-10-CM | POA: Diagnosis not present

## 2018-02-04 LAB — BASIC METABOLIC PANEL
Anion gap: 5 (ref 5–15)
BUN: 17 mg/dL (ref 6–20)
CALCIUM: 9.1 mg/dL (ref 8.9–10.3)
CHLORIDE: 108 mmol/L (ref 98–111)
CO2: 28 mmol/L (ref 22–32)
CREATININE: 1.06 mg/dL (ref 0.61–1.24)
GFR calc non Af Amer: >60 mL/min (ref >60)
Glucose, Bld: 108 mg/dL — ABNORMAL HIGH (ref 70–99)
Potassium: 3.9 mmol/L (ref 3.5–5.1)
SODIUM: 141 mmol/L (ref 135–145)

## 2018-02-04 MED ORDER — MIDAZOLAM HCL 2 MG/2ML IJ SOLN
INTRAMUSCULAR | Status: AC
  Filled 2018-02-04: qty 2

## 2018-02-04 MED ORDER — SODIUM CHLORIDE 0.9 % IV SOLN
500.0000 mL | Freq: Once | INTRAVENOUS | Status: AC
  Administered 2018-02-04: 500 mL via INTRAVENOUS

## 2018-02-04 MED ORDER — SODIUM CHLORIDE 0.9 % IV SOLN
INTRAVENOUS | Status: DC | PRN
  Administered 2018-02-04: 11:00:00 via INTRAVENOUS

## 2018-02-04 MED ORDER — GLYCOPYRROLATE 0.2 MG/ML IJ SOLN
INTRAMUSCULAR | Status: AC
  Administered 2018-02-04: 0.1 mg via INTRAVENOUS
  Filled 2018-02-04: qty 1

## 2018-02-04 MED ORDER — GLYCOPYRROLATE 0.2 MG/ML IJ SOLN
0.1000 mg | Freq: Once | INTRAMUSCULAR | Status: AC
  Administered 2018-02-04: 0.1 mg via INTRAVENOUS

## 2018-02-04 MED ORDER — ONDANSETRON HCL 4 MG/2ML IJ SOLN: 4.0000 mg | Freq: Once | INTRAMUSCULAR | Status: DC | PRN

## 2018-02-04 MED ORDER — SUCCINYLCHOLINE CHLORIDE 20 MG/ML IJ SOLN
INTRAMUSCULAR | Status: DC | PRN
  Administered 2018-02-04: 100 mg via INTRAVENOUS

## 2018-02-04 MED ORDER — SUCCINYLCHOLINE CHLORIDE 20 MG/ML IJ SOLN
INTRAMUSCULAR | Status: AC
  Filled 2018-02-04: qty 1

## 2018-02-04 MED ORDER — METHOHEXITAL SODIUM 0.5 G IJ SOLR
INTRAMUSCULAR | Status: AC
  Filled 2018-02-04: qty 500

## 2018-02-04 MED ORDER — METHOHEXITAL SODIUM 100 MG/10ML IV SOSY
PREFILLED_SYRINGE | INTRAVENOUS | Status: DC | PRN
  Administered 2018-02-04: 70 mg via INTRAVENOUS

## 2018-02-04 MED ORDER — MIDAZOLAM HCL 2 MG/2ML IJ SOLN: 1.0000 mg | Freq: Once | INTRAMUSCULAR | Status: DC

## 2018-02-04 MED ORDER — MIDAZOLAM HCL 5 MG/5ML IJ SOLN
INTRAMUSCULAR | Status: DC | PRN
  Administered 2018-02-04: 1 mg via INTRAVENOUS

## 2018-02-04 NOTE — H&P (Signed)
Jonathan Alexander is an 32 y.o. male.   Chief Complaint: Patient has no complaint.  Denies anger or depression HPI: History of schizoaffective disorder with mood instability but has been stable with the assistance of maintenance ECT  Past Medical History:  Diagnosis Date  . Schizophrenia ([place])   . Tachycardia     History reviewed. No pertinent surgical history.  Family History  Family history unknown: Yes   Social History:  reports that he has quit smoking. His smoking use included cigarettes. He quit after 4.00 years of use. He has never used smokeless tobacco. He reports that he does not drink alcohol or use drugs.  Allergies:  Allergies  Allergen Reactions  . Divalproex Sodium   . Shellfish-Derived Products      (Not in a hospital admission)  Results for orders placed or performed during the hospital encounter of 02/04/18 (from the past 48 hour(s))  Basic metabolic panel     Status: Abnormal   Collection Time: 02/04/18 10:05 AM  Result Value Ref Range   Sodium 141 135 - 145 mmol/L   Potassium 3.9 3.5 - 5.1 mmol/L   Chloride 108 98 - 111 mmol/L    Comment: Please note change in reference range.   CO2 28 22 - 32 mmol/L   Glucose, Bld 108 (H) 70 - 99 mg/dL    Comment: Please note change in reference range.   BUN 17 6 - 20 mg/dL    Comment: Please note change in reference range.   Creatinine, Ser 1.06 0.61 - 1.24 mg/dL   Calcium 9.1 8.9 - 10.3 mg/dL   GFR calc non Af Amer >60 >60 mL/min   GFR calc Af Amer >60 >60 mL/min    Comment: (NOTE) The eGFR has been calculated using the CKD EPI equation. This calculation has not been validated in all clinical situations. eGFR's persistently <60 mL/min signify possible Chronic Kidney Disease.    Anion gap 5 5 - 15    Comment: Performed at [facility], [address] [place], [place] [address]   No results found.  Review of Systems  Constitutional: Negative.   HENT: Negative.   Eyes: Negative.   Respiratory:  Negative.   Cardiovascular: Negative.   Gastrointestinal: Negative.   Musculoskeletal: Negative.   Skin: Negative.   Neurological: Negative.   Psychiatric/Behavioral: Negative.     Blood pressure 113/76, pulse 97, temperature 98.8 F (37.1 C), temperature source Oral, resp. rate 16, height 5' 9"  (1.753 m), weight 95.3 kg (210 lb), SpO2 98 %. Physical Exam  Nursing note and vitals reviewed. Constitutional: He appears well-developed and well-nourished.  HENT:  Head: Normocephalic and atraumatic.  Eyes: Pupils are equal, round, and reactive to light. Conjunctivae are normal.  Neck: Normal range of motion.  Cardiovascular: Regular rhythm and normal heart sounds.  Respiratory: Effort normal. No respiratory distress.  GI: Soft.  Musculoskeletal: Normal range of motion.  Neurological: He is alert.  Skin: Skin is warm and dry.  Psychiatric: He has a normal mood and affect. His behavior is normal. Judgment and thought content normal.     Assessment/Plan We see him back about every 6 weeks and will do so again.  Alethia Berthold, MD 02/04/2018, 10:40 AM

## 2018-02-04 NOTE — Anesthesia Postprocedure Evaluation (Signed)
Anesthesia Post Note  Patient: Jonathan Alexander  Procedure(s) Performed: ECT TX  Patient location during evaluation: PACU Anesthesia Type: General Level of consciousness: awake and alert Pain management: pain level controlled Vital Signs Assessment: post-procedure vital signs reviewed and stable Respiratory status: spontaneous breathing and respiratory function stable Cardiovascular status: stable Anesthetic complications: no     Last Vitals:  Vitals:   02/04/18 1128 02/04/18 1136  BP: 124/81   Pulse: 100 88  Resp: 15 16  Temp: 37.4 C 37.4 C  SpO2: 98%     Last Pain:  Vitals:   02/04/18 1136  TempSrc: Oral  PainSc:                  KEPHART,WILLIAM K

## 2018-02-04 NOTE — Transfer of Care (Signed)
Immediate Anesthesia Transfer of Care Note  Patient: Jonathan Alexander  Procedure(s) Performed: ECT TX  Patient Location: PACU  Anesthesia Type:General  Level of Consciousness: awake  Airway & Oxygen Therapy: Patient Spontanous Breathing and Patient connected to face mask oxygen  Post-op Assessment: Report given to RN and Post -op Vital signs reviewed and stable  Post vital signs: Reviewed  Last Vitals:  Vitals Value Taken Time  BP 125/96 02/04/2018 10:58 AM  Temp    Pulse 107 02/04/2018 10:58 AM  Resp 19 02/04/2018 10:58 AM  SpO2 100 % 02/04/2018 10:58 AM  Vitals shown include unvalidated device data.  Last Pain:  Vitals:   02/04/18 0936  TempSrc: Oral         Complications: No apparent anesthesia complications

## 2018-02-04 NOTE — Procedures (Signed)
ECT SERVICES Physician's Interval Evaluation & Treatment Note  Patient Identification: Jonathan Alexander MRN:  [id number] Date of Evaluation:  02/04/2018 TX #: 20  MADRS:   MMSE:   P.E. Findings:  No change to physical exam  Psychiatric Interval Note:  Doing well no complaints no evidence of psychosis or agitation  Subjective:  Patient is a 32 y.o. male seen for evaluation for Electroconvulsive Therapy. He has no complaints.  Physically feels fine no problems with the ECT.  Treatment Summary:   []   Right Unilateral             [x]  Bilateral   % Energy : 1.0 ms 100%   Impedance: 890 ohms  Seizure Energy Index: 12,438 V squared  Postictal Suppression Index: 94%  Seizure Concordance Index: 93%  Medications  Pre Shock: Robinul 0.2 mg Brevital 70 mg succinylcholine 100 mg  Post Shock: Versed 1 mg  Seizure Duration: 5 seconds EMG 35 seconds EEG   Comments: Follow-up in 6 weeks  Lungs:  [x]   Clear to auscultation               []  Other:   Heart:    [x]   Regular rhythm             []  irregular rhythm    [x]   Previous H&P reviewed, patient examined and there are NO CHANGES                 []   Previous H&P reviewed, patient examined and there are changes noted.   Jonathan RasmussenJohn Schneur Crowson, MD 7/3/201910:41 AM

## 2018-02-04 NOTE — Anesthesia Preprocedure Evaluation (Signed)
Anesthesia Evaluation  Patient identified by MRN, date of birth, ID band Patient awake    Reviewed: Allergy & Precautions, NPO status , Patient's Chart, lab work & pertinent test results, reviewed documented beta blocker date and time   History of Anesthesia Complications Negative for: history of anesthetic complications  Airway Mallampati: II  TM Distance: >3 FB     Dental  (+) Chipped   Pulmonary neg sleep apnea, neg COPD, former smoker,    breath sounds clear to auscultation- rhonchi (-) wheezing      Cardiovascular Exercise Tolerance: Good (-) hypertension(-) CAD, (-) Past MI and (-) Cardiac Stents  Rhythm:Regular Rate:Normal - Systolic murmurs and - Diastolic murmurs    Neuro/Psych PSYCHIATRIC DISORDERS Schizophrenia negative neurological ROS     GI/Hepatic negative GI ROS, Neg liver ROS,   Endo/Other  negative endocrine ROSneg diabetes  Renal/GU negative Renal ROS     Musculoskeletal negative musculoskeletal ROS (+)   Abdominal (+) + obese,   Peds  Hematology negative hematology ROS (+)   Anesthesia Other Findings Past Medical History: No date: Schizophrenia (HCC) No date: Tachycardia   Reproductive/Obstetrics                            Anesthesia Physical Anesthesia Plan  ASA: III  Anesthesia Plan: General   Post-op Pain Management:    Induction: Intravenous  PONV Risk Score and Plan: 2 and Ondansetron  Airway Management Planned: Mask  Additional Equipment:   Intra-op Plan:   Post-operative Plan:   Informed Consent: I have reviewed the patients History and Physical, chart, labs and discussed the procedure including the risks, benefits and alternatives for the proposed anesthesia with the patient or authorized representative who has indicated his/her understanding and acceptance.     Plan Discussed with: CRNA and Anesthesiologist  Anesthesia Plan Comments:         Anesthesia Quick Evaluation  

## 2018-02-04 NOTE — Anesthesia Post-op Follow-up Note (Signed)
Anesthesia QCDR form completed.        

## 2018-02-04 NOTE — Discharge Instructions (Signed)
1)  The drugs that you have been given will stay in your system until tomorrow so for the       next 24 hours you should not:  A. Drive an automobile  B. Make any legal decisions  C. Drink any alcoholic beverages  2)  You may resume your regular meals upon return home.  3)  A responsible adult must take you home.  Someone should stay with you for a few          hours, then be available by phone for the remainder of the treatment day.  4)  You May experience any of the following symptoms:  Headache, Nausea and a dry mouth (due to the medications you were given),  temporary memory loss and some confusion, or sore muscles (a warm bath  should help this).  If you you experience any of these symptoms let us know on                your return visit.  5)  Report any of the following: any acute discomfort, severe headache, or temperature        greater than 100.5 F.   Also report any unusual redness, swelling, drainage, or pain         at your IV site.    You may report Symptoms to:  ECT PROGRAM- [place] [facility]          Phone: [phone], ECT Department           or Dr. Shary Keylapac's office [phone]  6)  Your next ECT Treatment is Wednesday August 14   We will call 2 days prior to your scheduled appointment for arrival times.  7)  Nothing to eat or drink after midnight the night before your procedure.  8)  Take     With a sip of water the morning of your procedure.  9)  Other Instructions: Call [phone] to cancel the morning of your procedure due         to illness or emergency.  10) We will call within 72 hours to assess how you are feeling.

## 2018-02-06 DIAGNOSIS — Z79899 Other long term (current) drug therapy: Secondary | ICD-10-CM | POA: Diagnosis not present

## 2018-02-09 ENCOUNTER — Inpatient Hospital Stay
Admission: AD | Admit: 2018-02-09 | Discharge: 2018-02-13 | DRG: 885 | Disposition: A | Discharge status: Home / Self Care | Payer: Medicare Other | Source: Intra-hospital | Attending: Psychiatry | Admitting: Psychiatry

## 2018-02-09 ENCOUNTER — Encounter: Payer: Self-pay | Admitting: Emergency Medicine

## 2018-02-09 ENCOUNTER — Other Ambulatory Visit: Payer: Self-pay

## 2018-02-09 ENCOUNTER — Emergency Department
Admission: EM | Admit: 2018-02-09 | Discharge: 2018-02-09 | Disposition: A | Discharge status: Other Acute Inpatient Hospital | Payer: Medicare Other | Attending: Emergency Medicine | Admitting: Emergency Medicine

## 2018-02-09 ENCOUNTER — Encounter: Payer: Self-pay | Admitting: Psychiatry

## 2018-02-09 DIAGNOSIS — I10 Essential (primary) hypertension: Secondary | ICD-10-CM | POA: Diagnosis present

## 2018-02-09 DIAGNOSIS — F17210 Nicotine dependence, cigarettes, uncomplicated: Secondary | ICD-10-CM | POA: Diagnosis present

## 2018-02-09 DIAGNOSIS — Z7984 Long term (current) use of oral hypoglycemic drugs: Secondary | ICD-10-CM

## 2018-02-09 DIAGNOSIS — Z888 Allergy status to other drugs, medicaments and biological substances status: Secondary | ICD-10-CM

## 2018-02-09 DIAGNOSIS — R45851 Suicidal ideations: Secondary | ICD-10-CM | POA: Insufficient documentation

## 2018-02-09 DIAGNOSIS — F250 Schizoaffective disorder, bipolar type: Principal | ICD-10-CM | POA: Diagnosis present

## 2018-02-09 DIAGNOSIS — F209 Schizophrenia, unspecified: Secondary | ICD-10-CM | POA: Insufficient documentation

## 2018-02-09 DIAGNOSIS — Z79899 Other long term (current) drug therapy: Secondary | ICD-10-CM

## 2018-02-09 DIAGNOSIS — Z91018 Allergy to other foods: Secondary | ICD-10-CM

## 2018-02-09 DIAGNOSIS — Z6830 Body mass index (BMI) 30.0-30.9, adult: Secondary | ICD-10-CM | POA: Diagnosis not present

## 2018-02-09 DIAGNOSIS — R41843 Psychomotor deficit: Secondary | ICD-10-CM | POA: Diagnosis present

## 2018-02-09 DIAGNOSIS — E669 Obesity, unspecified: Secondary | ICD-10-CM | POA: Diagnosis not present

## 2018-02-09 LAB — URINE DRUG SCREEN, QUALITATIVE (ARMC ONLY)
Amphetamines, Ur Screen: NOT DETECTED
Benzodiazepine, Ur Scrn: POSITIVE — AB
CANNABINOID 50 NG, UR ~~LOC~~: NOT DETECTED
COCAINE METABOLITE, UR ~~LOC~~: NOT DETECTED
MDMA (Ecstasy)Ur Screen: NOT DETECTED
Methadone Scn, Ur: NOT DETECTED
Opiate, Ur Screen: NOT DETECTED
PHENCYCLIDINE (PCP) UR S: NOT DETECTED
TRICYCLIC, UR SCREEN: POSITIVE — AB

## 2018-02-09 LAB — COMPREHENSIVE METABOLIC PANEL
ALK PHOS: 87 U/L (ref 38–126)
ALT: 18 U/L (ref 0–44)
AST: 20 U/L (ref 15–41)
Albumin: 4.4 g/dL (ref 3.5–5.0)
Anion gap: 8 (ref 5–15)
BUN: 16 mg/dL (ref 6–20)
CALCIUM: 9.5 mg/dL (ref 8.9–10.3)
CO2: 26 mmol/L (ref 22–32)
CREATININE: 0.79 mg/dL (ref 0.61–1.24)
Chloride: 103 mmol/L (ref 98–111)
Glucose, Bld: 106 mg/dL — ABNORMAL HIGH (ref 70–99)
Potassium: 4.1 mmol/L (ref 3.5–5.1)
Sodium: 137 mmol/L (ref 135–145)
TOTAL PROTEIN: 7.4 g/dL (ref 6.5–8.1)
Total Bilirubin: 0.7 mg/dL (ref 0.3–1.2)

## 2018-02-09 LAB — DIFFERENTIAL
BASOS ABS: 0 10*3/uL (ref 0–0.1)
BASOS PCT: 0 %
EOS ABS: 0 10*3/uL (ref 0–0.7)
EOS PCT: 0 %
Lymphocytes Relative: 16 %
Lymphs Abs: 1.6 10*3/uL (ref 1.0–3.6)
MONOS PCT: 6 %
Monocytes Absolute: 0.6 10*3/uL (ref 0.2–1.0)
Neutro Abs: 8 10*3/uL — ABNORMAL HIGH (ref 1.4–6.5)
Neutrophils Relative %: 78 %

## 2018-02-09 LAB — CBC
HEMATOCRIT: 39.2 % — AB (ref 40.0–52.0)
HEMOGLOBIN: 13.7 g/dL (ref 13.0–18.0)
MCH: 31.9 pg (ref 26.0–34.0)
MCHC: 34.9 g/dL (ref 32.0–36.0)
MCV: 91.5 fL (ref 80.0–100.0)
Platelets: 242 10*3/uL (ref 150–440)
RBC: 4.28 MIL/uL — ABNORMAL LOW (ref 4.40–5.90)
RDW: 13.2 % (ref 11.5–14.5)
WBC: 9.9 10*3/uL (ref 3.8–10.6)

## 2018-02-09 LAB — ACETAMINOPHEN LEVEL: Acetaminophen (Tylenol), Serum: <10 ug/mL — ABNORMAL LOW (ref 10–30)

## 2018-02-09 LAB — SALICYLATE LEVEL: Salicylate Lvl: <7 mg/dL (ref 2.8–30.0)

## 2018-02-09 LAB — ETHANOL: Alcohol, Ethyl (B): <10 mg/dL (ref <10)

## 2018-02-09 MED ORDER — LITHIUM CARBONATE 300 MG PO CAPS
600.0000 mg | ORAL_CAPSULE | Freq: Every day | ORAL | Status: DC
  Administered 2018-02-09: 600 mg via ORAL
  Filled 2018-02-09 (×2): qty 2

## 2018-02-09 MED ORDER — OLANZAPINE 10 MG PO TABS
10.0000 mg | ORAL_TABLET | Freq: Every day | ORAL | Status: DC
  Administered 2018-02-10 – 2018-02-13 (×4): 10 mg via ORAL
  Filled 2018-02-09 (×4): qty 1

## 2018-02-09 MED ORDER — VITAMIN C 500 MG PO TABS
250.0000 mg | ORAL_TABLET | Freq: Every day | ORAL | Status: DC
  Administered 2018-02-10 – 2018-02-13 (×4): 250 mg via ORAL
  Filled 2018-02-09 (×4): qty 0.5

## 2018-02-09 MED ORDER — LITHIUM CARBONATE 300 MG PO CAPS
600.0000 mg | ORAL_CAPSULE | Freq: Every day | ORAL | Status: DC
  Administered 2018-02-10 – 2018-02-12 (×3): 600 mg via ORAL
  Filled 2018-02-09 (×4): qty 2

## 2018-02-09 MED ORDER — OLANZAPINE 10 MG PO TABS: 10.0000 mg | ORAL_TABLET | Freq: Every day | ORAL | Status: DC

## 2018-02-09 MED ORDER — DESMOPRESSIN ACETATE 0.2 MG PO TABS
0.2000 mg | ORAL_TABLET | Freq: Every day | ORAL | Status: DC
  Administered 2018-02-10 – 2018-02-12 (×3): 0.2 mg via ORAL
  Filled 2018-02-09 (×4): qty 1

## 2018-02-09 MED ORDER — DOCUSATE SODIUM 100 MG PO CAPS
100.0000 mg | ORAL_CAPSULE | Freq: Two times a day (BID) | ORAL | Status: DC
  Administered 2018-02-09 (×2): 100 mg via ORAL
  Filled 2018-02-09 (×3): qty 1

## 2018-02-09 MED ORDER — LORATADINE 10 MG PO TBDP: 10.0000 mg | ORAL_TABLET | Freq: Every day | ORAL | Status: DC

## 2018-02-09 MED ORDER — HALOPERIDOL 5 MG PO TABS: 5.0000 mg | ORAL_TABLET | Freq: Once | ORAL | Status: DC

## 2018-02-09 MED ORDER — NICOTINE 21 MG/24HR TD PT24
21.0000 mg | MEDICATED_PATCH | Freq: Every day | TRANSDERMAL | Status: DC
  Administered 2018-02-09: 21 mg via TRANSDERMAL
  Filled 2018-02-09: qty 1

## 2018-02-09 MED ORDER — POLYETHYLENE GLYCOL 3350 17 G PO PACK
17.0000 g | PACK | Freq: Every day | ORAL | Status: DC
  Filled 2018-02-09 (×2): qty 1

## 2018-02-09 MED ORDER — METOPROLOL SUCCINATE ER 50 MG PO TB24
150.0000 mg | ORAL_TABLET | Freq: Every day | ORAL | Status: DC
  Administered 2018-02-09: 150 mg via ORAL
  Filled 2018-02-09: qty 3

## 2018-02-09 MED ORDER — DOCUSATE SODIUM 100 MG PO CAPS
100.0000 mg | ORAL_CAPSULE | Freq: Two times a day (BID) | ORAL | Status: DC
  Administered 2018-02-10 – 2018-02-12 (×6): 100 mg via ORAL
  Filled 2018-02-09 (×6): qty 1

## 2018-02-09 MED ORDER — TRAZODONE HCL 100 MG PO TABS
100.0000 mg | ORAL_TABLET | Freq: Every evening | ORAL | Status: DC | PRN
  Administered 2018-02-10: 100 mg via ORAL

## 2018-02-09 MED ORDER — POLYETHYLENE GLYCOL 3350 17 G PO PACK
17.0000 g | PACK | Freq: Every day | ORAL | Status: DC
  Administered 2018-02-09: 17 g via ORAL
  Filled 2018-02-09: qty 1

## 2018-02-09 MED ORDER — THIOTHIXENE 5 MG PO CAPS
10.0000 mg | ORAL_CAPSULE | Freq: Every day | ORAL | Status: DC
  Administered 2018-02-09: 10 mg via ORAL
  Filled 2018-02-09: qty 1
  Filled 2018-02-09: qty 2

## 2018-02-09 MED ORDER — CLOZAPINE 100 MG PO TABS
800.0000 mg | ORAL_TABLET | Freq: Every day | ORAL | Status: DC
  Administered 2018-02-10 – 2018-02-12 (×3): 800 mg via ORAL
  Filled 2018-02-09 (×3): qty 8

## 2018-02-09 MED ORDER — VITAMIN C 500 MG PO TABS
250.0000 mg | ORAL_TABLET | Freq: Every day | ORAL | Status: DC
  Administered 2018-02-09: 250 mg via ORAL
  Filled 2018-02-09: qty 0.5

## 2018-02-09 MED ORDER — LORAZEPAM 2 MG/ML IJ SOLN
2.0000 mg | Freq: Once | INTRAMUSCULAR | Status: AC
  Administered 2018-02-09: 2 mg via INTRAMUSCULAR
  Filled 2018-02-09: qty 1

## 2018-02-09 MED ORDER — CLONAZEPAM 1 MG PO TABS
1.0000 mg | ORAL_TABLET | Freq: Every day | ORAL | Status: DC
  Administered 2018-02-10 – 2018-02-12 (×3): 1 mg via ORAL
  Filled 2018-02-09 (×3): qty 1

## 2018-02-09 MED ORDER — METFORMIN HCL 500 MG PO TABS
500.0000 mg | ORAL_TABLET | Freq: Two times a day (BID) | ORAL | Status: DC
  Administered 2018-02-09 (×2): 500 mg via ORAL
  Filled 2018-02-09 (×2): qty 1

## 2018-02-09 MED ORDER — METFORMIN HCL 500 MG PO TABS
500.0000 mg | ORAL_TABLET | Freq: Two times a day (BID) | ORAL | Status: DC
  Administered 2018-02-10 – 2018-02-12 (×6): 500 mg via ORAL
  Filled 2018-02-09 (×6): qty 1

## 2018-02-09 MED ORDER — IMIPRAMINE HCL 50 MG PO TABS
175.0000 mg | ORAL_TABLET | Freq: Every day | ORAL | Status: DC
  Administered 2018-02-09: 175 mg via ORAL
  Filled 2018-02-09: qty 4

## 2018-02-09 MED ORDER — METOPROLOL SUCCINATE ER 100 MG PO TB24
150.0000 mg | ORAL_TABLET | Freq: Every day | ORAL | Status: DC
  Administered 2018-02-10 – 2018-02-13 (×4): 150 mg via ORAL
  Filled 2018-02-09 (×4): qty 1.5

## 2018-02-09 MED ORDER — LORATADINE 10 MG PO TABS
10.0000 mg | ORAL_TABLET | Freq: Every day | ORAL | Status: DC
  Administered 2018-02-10 – 2018-02-13 (×2): 10 mg via ORAL
  Filled 2018-02-09 (×3): qty 1

## 2018-02-09 MED ORDER — LORAZEPAM 1 MG PO TABS
1.0000 mg | ORAL_TABLET | Freq: Once | ORAL | Status: AC
  Administered 2018-02-09: 1 mg via ORAL
  Filled 2018-02-09: qty 1

## 2018-02-09 MED ORDER — ACETAMINOPHEN 325 MG PO TABS: 650.0000 mg | ORAL_TABLET | Freq: Four times a day (QID) | ORAL | Status: DC | PRN

## 2018-02-09 MED ORDER — NICOTINE 21 MG/24HR TD PT24
21.0000 mg | MEDICATED_PATCH | Freq: Every day | TRANSDERMAL | Status: DC
  Filled 2018-02-09 (×2): qty 1

## 2018-02-09 MED ORDER — VENLAFAXINE HCL 37.5 MG PO TABS
75.0000 mg | ORAL_TABLET | Freq: Two times a day (BID) | ORAL | Status: DC
  Administered 2018-02-09 (×2): 75 mg via ORAL
  Filled 2018-02-09 (×2): qty 2

## 2018-02-09 MED ORDER — FERROUS SULFATE 325 (65 FE) MG PO TABS
325.0000 mg | ORAL_TABLET | Freq: Every day | ORAL | Status: DC
  Administered 2018-02-10 – 2018-02-13 (×4): 325 mg via ORAL
  Filled 2018-02-09 (×4): qty 1

## 2018-02-09 MED ORDER — FERROUS SULFATE 325 (65 FE) MG PO TABS
325.0000 mg | ORAL_TABLET | Freq: Every day | ORAL | Status: DC
  Administered 2018-02-09: 325 mg via ORAL
  Filled 2018-02-09 (×2): qty 1

## 2018-02-09 MED ORDER — CLOZAPINE 100 MG PO TABS
800.0000 mg | ORAL_TABLET | Freq: Every day | ORAL | Status: DC
  Administered 2018-02-09: 800 mg via ORAL
  Filled 2018-02-09: qty 8

## 2018-02-09 MED ORDER — MAGNESIUM HYDROXIDE 400 MG/5ML PO SUSP: 30.0000 mL | Freq: Every day | ORAL | Status: DC | PRN

## 2018-02-09 MED ORDER — IMIPRAMINE HCL 50 MG PO TABS
175.0000 mg | ORAL_TABLET | Freq: Every day | ORAL | Status: DC
  Administered 2018-02-10 – 2018-02-12 (×3): 175 mg via ORAL
  Filled 2018-02-09 (×5): qty 1

## 2018-02-09 MED ORDER — HALOPERIDOL LACTATE 5 MG/ML IJ SOLN
10.0000 mg | Freq: Once | INTRAMUSCULAR | Status: AC
  Administered 2018-02-09: 10 mg via INTRAMUSCULAR
  Filled 2018-02-09: qty 2

## 2018-02-09 MED ORDER — OLANZAPINE 10 MG PO TABS
10.0000 mg | ORAL_TABLET | Freq: Every day | ORAL | Status: DC
  Administered 2018-02-09: 10 mg via ORAL
  Filled 2018-02-09: qty 1

## 2018-02-09 MED ORDER — LORAZEPAM 1 MG PO TABS: 1.0000 mg | ORAL_TABLET | Freq: Once | ORAL | Status: DC

## 2018-02-09 MED ORDER — SENNA 8.6 MG PO TABS
2.0000 | ORAL_TABLET | Freq: Every day | ORAL | Status: DC
  Administered 2018-02-09: 17.2 mg via ORAL
  Filled 2018-02-09 (×2): qty 2

## 2018-02-09 MED ORDER — DIPHENHYDRAMINE HCL 50 MG/ML IJ SOLN
50.0000 mg | Freq: Once | INTRAMUSCULAR | Status: AC
  Administered 2018-02-09: 50 mg via INTRAMUSCULAR
  Filled 2018-02-09: qty 1

## 2018-02-09 MED ORDER — ALUM & MAG HYDROXIDE-SIMETH 200-200-20 MG/5ML PO SUSP: 30.0000 mL | ORAL | Status: DC | PRN

## 2018-02-09 MED ORDER — CLONAZEPAM 1 MG PO TABS
1.0000 mg | ORAL_TABLET | Freq: Every day | ORAL | Status: DC
  Administered 2018-02-09: 1 mg via ORAL
  Filled 2018-02-09: qty 2

## 2018-02-09 MED ORDER — DESMOPRESSIN ACETATE 0.2 MG PO TABS
0.2000 mg | ORAL_TABLET | Freq: Every day | ORAL | Status: DC
  Administered 2018-02-09: 0.2 mg via ORAL
  Filled 2018-02-09 (×2): qty 1

## 2018-02-09 MED ORDER — LORATADINE 10 MG PO TABS
10.0000 mg | ORAL_TABLET | Freq: Every day | ORAL | Status: DC
Start: 2018-02-09 — End: 2018-02-09
  Administered 2018-02-09: 10 mg via ORAL
  Filled 2018-02-09: qty 1

## 2018-02-09 MED ORDER — VENLAFAXINE HCL 37.5 MG PO TABS
75.0000 mg | ORAL_TABLET | Freq: Two times a day (BID) | ORAL | Status: DC
  Administered 2018-02-10 – 2018-02-12 (×6): 75 mg via ORAL
  Filled 2018-02-09 (×8): qty 2

## 2018-02-09 MED ORDER — SENNA 8.6 MG PO TABS
2.0000 | ORAL_TABLET | Freq: Every day | ORAL | Status: DC
  Administered 2018-02-10 – 2018-02-12 (×3): 17.2 mg via ORAL
  Filled 2018-02-09 (×3): qty 2

## 2018-02-09 MED ORDER — HYDROXYZINE HCL 50 MG PO TABS: 50.0000 mg | ORAL_TABLET | Freq: Three times a day (TID) | ORAL | Status: DC | PRN

## 2018-02-09 NOTE — Progress Notes (Signed)
MEDICATION RELATED CONSULT NOTE - INITIAL   Pharmacy Consult for Clozapine monitoring   Allergies  Allergen Reactions  . Divalproex Sodium   . Shellfish-Derived Products    Labs: Recent Labs    02/09/18 0550  WBC 9.9  HGB 13.7  HCT 39.2*  PLT 242  CREATININE 0.79  ALBUMIN 4.4  PROT 7.4  AST 20  ALT 18  ALKPHOS 87  BILITOT 0.7   Estimated Creatinine Clearance: 156.6 mL/min (by C-G formula based on SCr of 0.79 mg/dL).  Medical History: Past Medical History:  Diagnosis Date  . Schizophrenia (HCC)   . Tachycardia     Assessment: 32 yo on clozapine 800 mg at bedtime  02/09/18  ANC= 8.0  Plan:  Lab entered into clozapine REMS program.  Check weekly ANC while inpatient. Next ANC 02/16/18.  Vandana Haman A 02/09/2018,10:16 AM

## 2018-02-09 NOTE — ED Notes (Signed)
Antonio Marvis MoellerMiles called stating he's calling from the pt's [facility]. Wanted update on pt ut could not provide PIN number. Advised to contact pt's guardian for update. Advised he could also come during visiting hours but he declined.

## 2018-02-09 NOTE — ED Notes (Signed)
Report to include Situation, Background, Assessment, and Recommendations [facility]. Patient alert and oriented, warm and dry, in no acute distress. Patient denies HI, VH and pain. Patient states he is still SI without a plan and hears voices without command. Patient made aware of Q15 minute rounds and security cameras for their safety. Patient instructed to come to me with needs or concerns.

## 2018-02-09 NOTE — ED Notes (Signed)
Dr Clapaas in room talking with patient at this time. 

## 2018-02-09 NOTE — ED Notes (Signed)
Hourly rounding reveals patient in room. No complaints, stable, in no acute distress. Q15 minute rounds and monitoring via Security Cameras to continue. 

## 2018-02-09 NOTE — ED Notes (Signed)
Called pt's legal guardian, [name] and notified him of pt's arrival to the ED for evaluation

## 2018-02-09 NOTE — ED Notes (Signed)
VOL  SEEN  BY  DR  CLAPACS  PENDING  PLACEMENT 

## 2018-02-09 NOTE — ED Notes (Signed)
Pt given sprite 

## 2018-02-09 NOTE — ED Notes (Signed)
Am meds administered as ordered  -  He continues to await psychiatry consult  Pt to transfer to ED BHU at this time

## 2018-02-09 NOTE — ED Notes (Addendum)
Pt in dayroom speaking with chaplain at this time.

## 2018-02-09 NOTE — Progress Notes (Signed)
Chaplain received call in on-call suite requesting a bible for the patient. Chaplain offered the bible, but patient declined at that time stating that he would leave it for later. Patient then asked chaplain if she could come in to talk. Nurse Tech accompanied chaplain to community sitting area where chaplain talked with patient. Patient was slow to respond to questions, but eventually answered appropriately. Despite request to talk, patient was reluctant to engage in dialogue. Patient seemed as if he had a lot on his mind. He did state that he wanted to "go with God and Jesus in heaven", that he was concerned he was not saved and not going to heaven, and that he felt as if he had done some wrong things in life. Patient appears to be highly religious and conscientious. Chaplain provided spiritual and emotional support engaging the patient in understanding his concept of who God is in his life. Despite his religiosity, patient states that he does not trust God. Patient asked to pray for his salvation, which chaplain did. Visit concluded and chaplain will follow-up as needed.    02/09/18 1700  Clinical Encounter Type  Visited With Patient  Visit Type Spiritual support  Spiritual Encounters  Spiritual Needs Sacred text;Prayer;Emotional

## 2018-02-09 NOTE — ED Triage Notes (Addendum)
Pt brought to ED by Deputy Everett GraffJunker [facility]; pt says he doesn't think his medications are working anymore; admits to being suicidal but not homicidal; "I don't think I'm going to find eternal peace"; pt calm and cooperative in triage; slow to answer questions but answers in complete coherent sentences;

## 2018-02-09 NOTE — BH Assessment (Signed)
[facility] by Dr. Toni Amendlapacs.  Attending Physician will be Dr. Carmon Ginsberg'Neil.   Patient has been assigned to room 311-A, by [facility] Charge Nurse T'Yawn.   Intake Paper Work has been signed and placed on patient chart.  ER staff is aware of the admission:  [facility]   Dr. Derrill KayGoodman, ER MD   Nicole Cellaorothy Patient's Nurse   [facility].

## 2018-02-09 NOTE — ED Notes (Signed)
Awake - lying in hallway bed  NAD observed  Introduced myself to him  - grape juice and crackers with PB provided  Denies pain  States  "I don't know if I am saved or if Jesus loves me"  Pt reassured  Reading provided  Continue to monitor

## 2018-02-09 NOTE — BH Assessment (Signed)
Assessment Note  Jonathan Alexander is an 32 y.o. male who presents to the ED voluntarily from his g/h with c/o current medications not working. Pt report that he is "just not happy'. While on the Behavioral Health Unit the pt became increasingly aggressive verbalizing to the RN "I need the shot because the voices are telling me to kill someone. Jerrye Noble are nice people and I don't want to hurt anyone." Pt remained calm until he received 10mg  of Haldol IM. The pt was initally given Ativan  1mg  PO but felt more anxious afterwards and requested the Haldol.   During the assessment, that pt was calm and cooperative. He was unable to answer questions appropriately and displayed apparent thought blocking behaviors and speech. He became confused about the questions being asked several times and would stare off into blank space. Pt endorses SI but states he no longer has feelings of HI. Pt endorses A/H.    Diagnosis: Schizophrenia    Past Medical History:  Past Medical History:  Diagnosis Date  . Schizophrenia (HCC)   . Tachycardia     History reviewed. No pertinent surgical history.  Family History:  Family History  Family history unknown: Yes    Social History:  reports that he has been smoking cigarettes.  He has smoked for the past 4.00 years. He has never used smokeless tobacco. He reports that he does not drink alcohol or use drugs.  Additional Social History:  Alcohol / Drug Use Pain Medications: SEE MAR Prescriptions: SEE MAR Over the Counter: SEE MAR History of alcohol / drug use?: No history of alcohol / drug abuse  CIWA: CIWA-Ar BP: 118/74 Pulse Rate: (!) 105 COWS:    Allergies:  Allergies  Allergen Reactions  . Divalproex Sodium   . Shellfish-Derived Products     Home Medications:  (Not in a hospital admission)  OB/GYN Status:  No LMP for male patient.  General Assessment Data Location of Assessment: [facility] ED TTS Assessment: In system Is this a Tele or Face-to-Face  Assessment?: Face-to-Face Is this an Initial Assessment or a Re-assessment for this encounter?: Initial Assessment Marital status: Single Living Arrangements: Group Home Can pt return to current living arrangement?: Yes Admission Status: Voluntary Is patient capable of signing voluntary admission?: Yes Referral Source: Self/Family/Friend Insurance type: Medicare  Medical Screening Exam [facility] Walk-[facility] ONLY) Medical Exam completed: Yes  Crisis Care Plan Living Arrangements: Group Home Legal Guardian: Other:([name]) Name of Psychiatrist: n/a Name of Therapist: n/a  Education Status Is patient currently in school?: No Is the patient employed, unemployed or receiving disability?: Receiving disability income  Risk to self with the past 6 months Suicidal Ideation: No Has patient been a risk to self within the past 6 months prior to admission? : No Suicidal Intent: No Has patient had any suicidal intent within the past 6 months prior to admission? : No Is patient at risk for suicide?: No Suicidal Plan?: No Has patient had any suicidal plan within the past 6 months prior to admission? : No Access to Means: No What has been your use of drugs/alcohol within the last 12 months?: denies use Previous Attempts/Gestures: No How many times?: 0 Other Self Harm Risks: n/a Triggers for Past Attempts: None known Intentional Self Injurious Behavior: None Family Suicide History: Unknown Recent stressful life event(s): Other (Comment) Persecutory voices/beliefs?: No Depression: Yes Depression Symptoms: Loss of interest in usual pleasures, Feeling worthless/self pity, Feeling angry/irritable Substance abuse history and/or treatment for substance abuse?: No Suicide prevention information  given to non-admitted patients: Not applicable  Risk to Others within the past 6 months Homicidal Ideation: Yes-Currently Present Does patient have any lifetime risk of violence toward others beyond the  six months prior to admission? : Yes (comment) Thoughts of Harm to Others: Yes-Currently Present Comment - Thoughts of Harm to Others: "The voices are telling me to kill someone." Current Homicidal Intent: Yes-Currently Present Current Homicidal Plan: No Access to Homicidal Means: No Identified Victim: n/a History of harm to others?: Yes Assessment of Violence: None Noted Does patient have access to weapons?: No Criminal Charges Pending?: No Does patient have a court date: No Is patient on probation?: No  Psychosis Hallucinations: Auditory Delusions: Unspecified  Mental Status Report Appearance/Hygiene: In scrubs Eye Contact: Good Motor Activity: Freedom of movement Speech: Soft, Slow Level of Consciousness: Alert Mood: Depressed, Sad Affect: Threatening, Depressed Anxiety Level: None Thought Processes: Thought Blocking Judgement: Impaired Orientation: Unable to assess Obsessive Compulsive Thoughts/Behaviors: Unable to Assess  Cognitive Functioning Concentration: Decreased Memory: Recent Impaired, Remote Impaired Is patient IDD: No Is patient DD?: No Insight: Poor Impulse Control: Poor Appetite: Poor Have you had any weight changes? : Gain Amount of the weight change? (lbs): 20 lbs Sleep: Decreased Vegetative Symptoms: Unable to Assess  ADLScreening [facility] Assessment Services) Patient's cognitive ability adequate to safely complete daily activities?: Yes Patient able to express need for assistance with ADLs?: Yes Independently performs ADLs?: Yes (appropriate for developmental age)  Prior Inpatient Therapy Prior Inpatient Therapy: Yes Prior Therapy Dates: 2017,2018, Prior Therapy Facilty/Provider(s): [facility]  Prior Outpatient Therapy Prior Outpatient Therapy: No Does patient have an ACCT team?: No Does patient have Intensive In-House Services?  : No Does patient have [facility] services? : No Does patient have P4CC services?: No  ADL Screening (condition at time  of admission) Patient's cognitive ability adequate to safely complete daily activities?: Yes Is the patient deaf or have difficulty hearing?: No Does the patient have difficulty seeing, even when wearing glasses/contacts?: No Does the patient have difficulty concentrating, remembering, or making decisions?: Yes Patient able to express need for assistance with ADLs?: Yes Does the patient have difficulty dressing or bathing?: No Independently performs ADLs?: Yes (appropriate for developmental age) Does the patient have difficulty walking or climbing stairs?: No Weakness of Legs: None Weakness of Arms/Hands: None  Home Assistive Devices/Equipment Home Assistive Devices/Equipment: None  Therapy Consults (therapy consults require a physician order) PT Evaluation Needed: No OT Evalulation Needed: No SLP Evaluation Needed: No Abuse/Neglect Assessment (Assessment to be complete while patient is alone) Abuse/Neglect Assessment Can Be Completed: Unable to assess, patient is non-responsive or altered mental status Values / Beliefs Cultural Requests During Hospitalization: None Spiritual Requests During Hospitalization: None Consults Spiritual Care Consult Needed: No Social Work Consult Needed: No      Additional Information 1:1 In Past 12 Months?: No CIRT Risk: No Elopement Risk: No Does patient have medical clearance?: Yes  Child/Adolescent Assessment Running Away Risk: (PT IS AN ADULT)  Disposition:  Disposition Initial Assessment Completed for this Encounter: Yes Disposition of Patient: (Pending recommendation) Patient refused recommended treatment: No Mode of transportation if patient is discharged?: Car Patient referred to: (Pending recommendation)  On Site Evaluation by:   Reviewed with Physician:    Desree Leap D Linard Daft 02/09/2018 12:56 PM

## 2018-02-09 NOTE — ED Notes (Signed)
VOL/Consult ordered/Moved to BHU-4 

## 2018-02-09 NOTE — Tx Team (Addendum)
Initial Treatment Plan 02/09/2018 11:19 PM Bracy [name] AOZ:[id number]    PATIENT STRESSORS: Health problems Medication change or noncompliance   PATIENT STRENGTHS: Ability for insight Communication skills Motivation for treatment/growth   PATIENT IDENTIFIED PROBLEMS: Medication non-compliance 02/09/18  Hallucinations 02/09/18  "Behavior" 02/09/18                 DISCHARGE CRITERIA:  Ability to meet basic life and health needs Adequate post-discharge living arrangements Improved stabilization in mood, thinking, and/or behavior Need for constant or close observation no longer present Reduction of life-threatening or endangering symptoms to within safe limits Safe-care adequate arrangements made Verbal commitment to aftercare and medication compliance  PRELIMINARY DISCHARGE PLAN: Attend aftercare/continuing care group Return to previous living arrangement  PATIENT/FAMILY INVOLVEMENT: This treatment plan has been presented to and reviewed with the patient, Jonathan Alexander.  The patient has been given the opportunity to ask questions and make suggestions.  Jonathan Pondserry J Oliwia Berzins, RN 02/09/2018, 11:19 PM

## 2018-02-09 NOTE — Progress Notes (Signed)
Patient ID: [name], male   DOB: 11/30/1985, 32 y.o.   MRN: [id number] Per State regulations 482.30 this chart was reviewed for medical necessity with respect to the patient's admission/duration of stay.    Next review date: 02/13/18  Thurman CoyerEric Miyuki Rzasa, BSN, RN-BC  Case Manager

## 2018-02-09 NOTE — ED Provider Notes (Signed)
[facility] Emergency Department Provider Note   ____________________________________________   First MD Initiated Contact with Patient 02/09/18 [phone]     (approximate)  I have reviewed the triage vital signs and the nursing notes.   HISTORY  Chief Complaint Suicidal    HPI Narvel Rzepka is a 32 y.o. male who comes into the hospital today with some suicidal ideation.  According to the triage note the patient came from his group home and states that he does not think his medications are working.  He has been having bad thoughts of wanting to hurt himself but denies any homicidal ideation.  When I asked the patient what brings him into the hospital today he states that his personal and he does not want to talk about it.  When I asked him if he was suicidal or homicidal or having any hallucinations he states that that is also personally does not want to talk about it.  The patient denies drinking or doing any drugs.   Past Medical History:  Diagnosis Date  . Schizophrenia (HCC)   . Tachycardia     There are no active problems to display for this patient.   History reviewed. No pertinent surgical history.  Prior to Admission medications   Medication Sig Start Date End Date Taking? Authorizing Provider  clonazePAM (KLONOPIN) 1 MG tablet Take 1 mg by mouth at bedtime.    [provider]  cloZAPine (CLOZARIL) 100 MG tablet Take 300 mg by mouth daily.    [provider]  cloZAPine (CLOZARIL) 100 MG tablet Take 800 mg by mouth at bedtime.    [provider]  cloZAPine (CLOZARIL) 25 MG tablet Take 50 mg by mouth daily.    [provider]  desmopressin (DDAVP) 0.2 MG tablet Take 0.2 mg by mouth at bedtime.    [provider]  docusate sodium (COLACE) 100 MG capsule Take 100 mg by mouth 2 (two) times daily.    [provider]  ferrous sulfate 325 (65 FE) MG tablet Take 325 mg by mouth daily with breakfast.     [provider]  imipramine (TOFRANIL) 50 MG tablet Take 175 mg by mouth at bedtime.    [provider]  lithium 300 MG tablet Take 600 mg by mouth at bedtime.    [provider]  loratadine (CLARITIN REDITABS) 10 MG dissolvable tablet Take 10 mg by mouth daily.    [provider]  metFORMIN (GLUCOPHAGE) 500 MG tablet Take 500 mg by mouth 2 (two) times daily with a meal.    [provider]  metoprolol succinate (TOPROL-XL) 100 MG 24 hr tablet Take 150 mg by mouth daily. Take with or immediately following a meal.    [provider]  Multiple Vitamins-Minerals (THEREMS-M) TABS Take 1 tablet by mouth every morning.    [provider]  OLANZapine (ZYPREXA) 10 MG tablet Take 10 mg by mouth daily.    [provider]  OLANZapine (ZYPREXA) 15 MG tablet Take 10 mg by mouth at bedtime.     [provider]  polyethylene glycol (MIRALAX / GLYCOLAX) packet Take 17 g by mouth daily.    [provider]  senna (SENOKOT) 8.6 MG TABS tablet Take 2 tablets by mouth at bedtime.    [provider]  thiothixene (NAVANE) 10 MG capsule Take 10 mg by mouth daily.    [provider]  thiothixene (NAVANE) 10 MG capsule Take 40 mg by mouth 3 (three)  times daily.    [provider]  venlafaxine (EFFEXOR) 75 MG tablet Take 75 mg by mouth 2 (two) times daily.    [provider]  vitamin C (ASCORBIC ACID) 250 MG tablet Take 250 mg by mouth daily.    [provider]    Allergies Divalproex sodium and Shellfish-derived products  Family History  Family history unknown: Yes    Social History Social History   Tobacco Use  . Smoking status: Current Every Day Smoker    Years: 4.00    Types: Cigarettes  . Smokeless tobacco: Never Used  Substance Use Topics  . Alcohol use: No  . Drug use: No    Review of Systems  Constitutional: No fever/chills Eyes: No visual changes. ENT: No sore  throat. Cardiovascular: Denies chest pain. Respiratory: Denies shortness of breath. Gastrointestinal: No abdominal pain.  No nausea, no vomiting.  Genitourinary: Negative for dysuria. Musculoskeletal: Negative for back pain. Skin: Negative for rash. Neurological: Negative for headaches, focal weakness or numbness. Psychiatric:Suicidal ideation   ____________________________________________   PHYSICAL EXAM:  VITAL SIGNS: ED Triage Vitals  Enc Vitals Group     BP 02/09/18 0540 118/74     Pulse Rate 02/09/18 0540 (!) 105     Resp 02/09/18 0540 17     Temp 02/09/18 0540 98.6 F (37 C)     Temp Source 02/09/18 0540 Oral     SpO2 02/09/18 0540 98 %     Weight 02/09/18 0542 220 lb (99.8 kg)     Height 02/09/18 0542 5\' 11"  (1.803 m)     Head Circumference --      Peak Flow --      Pain Score 02/09/18 0541 0     Pain Loc --      Pain Edu? --      Excl. in GC? --     Constitutional: Alert and oriented. Well appearing and in mild distress. Eyes: Conjunctivae are normal. PERRL. EOMI. Head: Atraumatic. Nose: No congestion/rhinnorhea. Mouth/Throat: Mucous membranes are moist.  Oropharynx non-erythematous. Cardiovascular: Normal rate, regular rhythm. Grossly normal heart sounds.  Good peripheral circulation. Respiratory: Normal respiratory effort.  No retractions. Lungs CTAB. Gastrointestinal: Soft and nontender. No distention.  Positive bowel sounds Musculoskeletal: No lower extremity tenderness nor edema.  Neurologic:  Normal speech and language. . Skin:  Skin is warm, dry and intact.  Psychiatric: Mood and affect are normal.   ____________________________________________   LABS (all labs ordered are listed, but only abnormal results are displayed)  Labs Reviewed  COMPREHENSIVE METABOLIC PANEL - Abnormal; Notable for the following components:      Result Value   Glucose, Bld 106 (*)    All other components within normal limits  ACETAMINOPHEN LEVEL - Abnormal; Notable  for the following components:   Acetaminophen (Tylenol), Serum <10 (*)    All other components within normal limits  CBC - Abnormal; Notable for the following components:   RBC 4.28 (*)    HCT 39.2 (*)    All other components within normal limits  URINE DRUG SCREEN, QUALITATIVE ([facility] ONLY) - Abnormal; Notable for the following components:   Tricyclic, Ur Screen POSITIVE (*)    Barbiturates, Ur Screen   (*)    Value: Result not available. Reagent lot number recalled by manufacturer.   Benzodiazepine, Ur Scrn POSITIVE (*)    All other components within normal limits  ETHANOL  SALICYLATE LEVEL   ____________________________________________  EKG  none ____________________________________________  RADIOLOGY  ED MD  interpretation:  none  Official radiology report(s): No results found.  ____________________________________________   PROCEDURES  Procedure(s) performed: None  Procedures  Critical Care performed: No  ____________________________________________   INITIAL IMPRESSION / ASSESSMENT AND PLAN / ED COURSE  As part of my medical decision making, I reviewed the following data within the electronic MEDICAL RECORD NUMBER Notes from prior ED visits and [place] Controlled Substance Database   This is a 32 year old male with a history of schizophrenia who comes into the hospital today with some suicidal ideation.  He would not tell me much about what was going on but he did tell staff in triage that he was having suicidal thoughts.  We will have the patient evaluated by TTS and by psych.  We did check some blood work on the patient and his results are unremarkable.      ____________________________________________   FINAL CLINICAL IMPRESSION(S) / ED DIAGNOSES  Final diagnoses:  Suicidal ideation     ED Discharge Orders    None       Note:  This document was prepared using Dragon voice recognition software and may include unintentional dictation errors.      Rebecka ApleyWebster, Abdifatah Colquhoun P, MD 02/09/18 [id number]

## 2018-02-09 NOTE — Plan of Care (Signed)
Pt. Just admitted during the night shift. Not enough time to assess progressions at this time.

## 2018-02-09 NOTE — ED Provider Notes (Signed)
Patient initially feeling anxious given 1 mg of Ativan p.o.  Then he begins feeling more anxious and feels like he wants to hurt himself he was offered more p.o. medicine but he wants a shot because he feels like he is going to hurt the staff.  He is been pacing in the room and then standing at the doorway looks fairly upset.  I will give him Haldol IM.   Arnaldo NatalMalinda, Paul F, MD 02/09/18 1038

## 2018-02-09 NOTE — ED Notes (Signed)
Patient at door stating his going to hurt someone his voices tells him to hurt someone, but he's not going to hurt me (Jonathan Alexander edt) because I'm nice !But patient states they tell me to hurt or kill someone.

## 2018-02-09 NOTE — ED Notes (Signed)
This is a 32 y.o.male; who [facility]; for c/o, "My meds ain't working." [facility]; has an ACT Team(?). Patient verbalizes having a H/O being aggressive; and has been inpatient @ [facility] and [facility]; currently suspicious; paranoid; has a Guardian--([name]--[phone]).

## 2018-02-09 NOTE — ED Notes (Signed)
This RN dressed out and took patient to [facility], the only thing of value noted is a black band wristwatch.

## 2018-02-09 NOTE — Progress Notes (Addendum)
D: Received patient from [facility]. Patient skin assessment completed with Vanice SarahAbi RN, skin is intact, Pt. Was searched and no contraband found. All prohibited unit items have been locked away for safety until discharge. Pt. Was admitted under the services of, Dr. Flora Lipps'Neal. Pt. Forwards little during assessments and presents with thought blocking behaviors. Pt. Reports both auditory and visual hallucinations, but denies homicidal and suicidal ideations. Pt. Is unable to elaborate on hallucinations. Pt. Reports high anxieties and depressions, but reports, "I just want to go lay down, that medication made me really sleepy" and reports no need for additional medications for comfort. Pt. Was given reportedly a lot of medication in the ED for behaviors. Pt. Has had no agitations during the admissions process.    A: Patient oriented to unit/room/call light. Patient was encourage to participate in unit activities and continue with plan of care. Q x 15 minute observation checks were initiated for safety. Pt. Given general admissions education. Pt. Given snacks and fluids for hydration.    R: Patient is receptive to treatment and safety plans provided. Staff will monitor per MD orders for safety.

## 2018-02-09 NOTE — ED Notes (Signed)

## 2018-02-09 NOTE — ED Notes (Signed)
ED  Is the patient under IVC or is there intent for IVC:  No  Is the patient medically cleared: Yes.   Is there vacancy in the ED BHU: Yes.   Is the population mix appropriate for patient: Yes.   Is the patient awaiting placement in inpatient or outpatient setting:    Has the patient had a psychiatric consult:  Consult pending   Survey of unit performed for contraband, proper placement and condition of furniture, tampering with fixtures in bathroom, shower, and each patient room: Yes.  ; Findings:  APPEARANCE/BEHAVIOR Calm and cooperative NEURO ASSESSMENT Orientation: oriented   Denies pain Hallucinations:  Most probable  History of schizophrenia   Speech: Normal  - slow to respond at times  Gait: normal RESPIRATORY ASSESSMENT Even  Unlabored respirations  CARDIOVASCULAR ASSESSMENT Pulses equal   regular rate  Skin warm and dry   GASTROINTESTINAL ASSESSMENT no GI complaint EXTREMITIES Full ROM  PLAN OF CARE Provide calm/safe environment. Vital signs assessed twice daily. ED BHU Assessment once each 12-hour shift. Collaborate with TTS daily or as condition indicates. Assure the ED provider has rounded once each shift. Provide and encourage hygiene. Provide redirection as needed. Assess for escalating behavior; address immediately and inform ED provider.  Assess family dynamic and appropriateness for visitation as needed: Yes.  ; If necessary, describe findings:  Educate the patient/family about BHU procedures/visitation: Yes.  ; If necessary, describe findings:

## 2018-02-09 NOTE — ED Notes (Addendum)
Pt restless and agitated, noted to be opening and closing his fists while standing at the nurses station window. Pt stated to staff that "I need some medicine because the voices telling me to hurt someone". Pt given Haldol 10mg  IM. Pt complied with injection. Dr Darnelle CatalanMalinda in to assess pt. Currently laying down in the bed with eyes open.

## 2018-02-09 NOTE — Consult Note (Signed)
[facility] Consult   Reason for Consult: Consult for 32 year old man with a history of schizoaffective disorder who came to the emergency room with vague but distressed complaints Referring Physician: Archie Balboa Patient Identification: Jonathan Alexander:  [id number] Principal Diagnosis: Schizoaffective disorder, bipolar type [facility]) Diagnosis:   Patient Active Problem List   Diagnosis Date Noted  . Schizoaffective disorder, bipolar type ([place]) [F25.0] 02/09/2018  . Hypertension [I10] 02/09/2018    Total Time spent with patient: 1 hour  Subjective:   Jonathan Alexander is a 32 y.o. male patient admitted with "I cannot get true comfort".  HPI: Patient seen chart reviewed.  Patient known to me from many previous encounters for ECT treatment.  This 32 year old man with a history of chronic mental health problems came to the emergency room after calling 911 himself.  He is extremely difficult to interview today.  He has profound thought blocking and can barely get any words out.  He indicates that he is feeling very distressed and upset.  Cannot explain exactly why.  Will not answer questions about hallucinations.  Talks about feeling like he cannot get "true comfort" by which she seems to mean something religious.  Patient indicates he has been taking all of his medicine.  Denies any substance abuse.  Denies any specific stressor that came up recently at his group home.  Medical history: Mild high blood pressure.  Otherwise pretty good medical health given his significant mental health issues.  Social history: Patient resides in a group home in [place].  I believe he does have a legal guardian.  Substance abuse history: No history of any substance abuse in the time we have been working with him.  Prior records not currently available but the patient denies any history of substance abuse  Past Psychiatric History: Patient has a long history of mental health problems and had  an extended hospitalization at [facility] characterized apparently by psychosis agitation paranoia difficulty interacting with others none of which ultimately came under good control until he started receiving ECT along with clozapine and lithium.  Patient has been receiving maintenance ECT from [place] for over a year.  During that time had not had any complaints or hospitalization.  Risk to Self: Suicidal Ideation: No Suicidal Intent: No Is patient at risk for suicide?: No Suicidal Plan?: No Access to Means: No What has been your use of drugs/alcohol within the last 12 months?: denies use How many times?: 0 Other Self Harm Risks: n/a Triggers for Past Attempts: None known Intentional Self Injurious Behavior: None Risk to Others: Homicidal Ideation: Yes-Currently Present Thoughts of Harm to Others: Yes-Currently Present Comment - Thoughts of Harm to Others: "The voices are telling me to kill someone." Current Homicidal Intent: Yes-Currently Present Current Homicidal Plan: No Access to Homicidal Means: No Identified Victim: n/a History of harm to others?: Yes Assessment of Violence: None Noted Does patient have access to weapons?: No Criminal Charges Pending?: No Does patient have a court date: No Prior Inpatient Therapy: Prior Inpatient Therapy: Yes Prior Therapy Dates: 2017,2018, Prior Therapy Facilty/Provider(s): [facility] Prior Outpatient Therapy: Prior Outpatient Therapy: No Does patient have an ACCT team?: No Does patient have Intensive In-House Services?  : No Does patient have [facility] services? : No Does patient have P4CC services?: No  Past Medical History:  Past Medical History:  Diagnosis Date  . Schizophrenia ([place])   . Tachycardia    History reviewed. No pertinent surgical history. Family History:  Family History  Family  history unknown: Yes   Family Psychiatric  History: None known Social History:  Social History   Substance and Sexual Activity   Alcohol Use No     Social History   Substance and Sexual Activity  Drug Use No    Social History   Socioeconomic History  . Marital status: Single    Spouse name: Not on file  . Number of children: Not on file  . Years of education: Not on file  . Highest education level: Not on file  Occupational History  . Not on file  Social Needs  . Financial resource strain: Not on file  . Food insecurity:    Worry: Not on file    Inability: Not on file  . Transportation needs:    Medical: Not on file    Non-medical: Not on file  Tobacco Use  . Smoking status: Current Every Day Smoker    Years: 4.00    Types: Cigarettes  . Smokeless tobacco: Never Used  Substance and Sexual Activity  . Alcohol use: No  . Drug use: No  . Sexual activity: Never  Lifestyle  . Physical activity:    Days per week: Not on file    Minutes per session: Not on file  . Stress: Not on file  Relationships  . Social connections:    Talks on phone: Not on file    Gets together: Not on file    Attends religious service: Not on file    Active member of club or organization: Not on file    Attends meetings of clubs or organizations: Not on file    Relationship status: Not on file  Other Topics Concern  . Not on file  Social History Narrative  . Not on file   Additional Social History:    Allergies:   Allergies  Allergen Reactions  . Divalproex Sodium   . Shellfish-Derived Products     Labs:  Results for orders placed or performed during the hospital encounter of 02/09/18 (from the past 48 hour(s))  Comprehensive metabolic panel     Status: Abnormal   Collection Time: 02/09/18  5:50 AM  Result Value Ref Range   Sodium 137 135 - 145 mmol/L   Potassium 4.1 3.5 - 5.1 mmol/L   Chloride 103 98 - 111 mmol/L    Comment: Please note change in reference range.   CO2 26 22 - 32 mmol/L   Glucose, Bld 106 (H) 70 - 99 mg/dL    Comment: Please note change in reference range.   BUN 16 6 - 20 mg/dL     Comment: Please note change in reference range.   Creatinine, Ser 0.79 0.61 - 1.24 mg/dL   Calcium 9.5 8.9 - 10.3 mg/dL   Total Protein 7.4 6.5 - 8.1 g/dL   Albumin 4.4 3.5 - 5.0 g/dL   AST 20 15 - 41 U/L   ALT 18 0 - 44 U/L    Comment: Please note change in reference range.   Alkaline Phosphatase 87 38 - 126 U/L   Total Bilirubin 0.7 0.3 - 1.2 mg/dL   GFR calc non Af Amer >60 >60 mL/min   GFR calc Af Amer >60 >60 mL/min    Comment: (NOTE) The eGFR has been calculated using the CKD EPI equation. This calculation has not been validated in all clinical situations. eGFR's persistently <60 mL/min signify possible Chronic Kidney Disease.    Anion gap 8 5 - 15    Comment: Performed at  [facility], [address] [place], [place] [address]  Ethanol     Status: None   Collection Time: 02/09/18  5:50 AM  Result Value Ref Range   Alcohol, Ethyl (B) <10 <10 mg/dL    Comment: (NOTE) Lowest detectable limit for serum alcohol is 10 mg/dL. For medical purposes only. Performed at [facility], [address] [place], [place] [address]   Salicylate level     Status: None   Collection Time: 02/09/18  5:50 AM  Result Value Ref Range   Salicylate Lvl <5.5 2.8 - 30.0 mg/dL    Comment: Performed at [facility], [address] [place], [place] [address]  Acetaminophen level     Status: Abnormal   Collection Time: 02/09/18  5:50 AM  Result Value Ref Range   Acetaminophen (Tylenol), Serum <10 (L) 10 - 30 ug/mL    Comment: (NOTE) Therapeutic concentrations vary significantly. A range of 10-30 ug/mL  may be an effective concentration for many patients. However, some  are best treated at concentrations outside of this range. Acetaminophen concentrations >150 ug/mL at 4 hours after ingestion  and >50 ug/mL at 12 hours after ingestion are often associated with  toxic reactions. Performed at [facility], [address] [place], [place] [address]   cbc      Status: Abnormal   Collection Time: 02/09/18  5:50 AM  Result Value Ref Range   WBC 9.9 3.8 - 10.6 K/uL   RBC 4.28 (L) 4.40 - 5.90 MIL/uL   Hemoglobin 13.7 13.0 - 18.0 g/dL   HCT 39.2 (L) 40.0 - 52.0 %   MCV 91.5 80.0 - 100.0 fL   MCH 31.9 26.0 - 34.0 pg   MCHC 34.9 32.0 - 36.0 g/dL   RDW 13.2 11.5 - 14.5 %   Platelets 242 150 - 440 K/uL    Comment: Performed at [facility], [address] [place],  [address]  Urine Drug Screen, Qualitative     Status: Abnormal   Collection Time: 02/09/18  5:50 AM  Result Value Ref Range   Tricyclic, Ur Screen POSITIVE (A) NONE DETECTED   Amphetamines, Ur Screen NONE DETECTED NONE DETECTED   MDMA (Ecstasy)Ur Screen NONE DETECTED NONE DETECTED   Cocaine Metabolite,Ur [place] NONE DETECTED NONE DETECTED   Opiate, Ur Screen NONE DETECTED NONE DETECTED   Phencyclidine (PCP) Ur S NONE DETECTED NONE DETECTED   Cannabinoid 50 Ng, Ur [place] NONE DETECTED NONE DETECTED   Barbiturates, Ur Screen (A) NONE DETECTED    Result not available. Reagent lot number recalled by manufacturer.   Benzodiazepine, Ur Scrn POSITIVE (A) NONE DETECTED   Methadone Scn, Ur NONE DETECTED NONE DETECTED    Comment: (NOTE) Tricyclics + metabolites, urine    Cutoff 1000 ng/mL Amphetamines + metabolites, urine  Cutoff 1000 ng/mL MDMA (Ecstasy), urine              Cutoff 500 ng/mL Cocaine Metabolite, urine          Cutoff 300 ng/mL Opiate + metabolites, urine        Cutoff 300 ng/mL Phencyclidine (PCP), urine         Cutoff 25 ng/mL Cannabinoid, urine                 Cutoff 50 ng/mL Barbiturates + metabolites, urine  Cutoff 200 ng/mL Benzodiazepine, urine              Cutoff 200 ng/mL Methadone, urine  Cutoff 300 ng/mL The urine drug screen provides only a preliminary, unconfirmed analytical test result and should not be used for non-medical purposes. Clinical consideration and professional judgment should be applied to any positive drug screen  result due to possible interfering substances. A more specific alternate chemical method must be used in order to obtain a confirmed analytical result. Gas chromatography / mass spectrometry (GC/MS) is the preferred confirmat ory method. Performed at [facility], [address] [place], [place] [address]   Differential     Status: Abnormal   Collection Time: 02/09/18  5:50 AM  Result Value Ref Range   Neutrophils Relative % 78 %   Neutro Abs 8.0 (H) 1.4 - 6.5 K/uL   Lymphocytes Relative 16 %   Lymphs Abs 1.6 1.0 - 3.6 K/uL   Monocytes Relative 6 %   Monocytes Absolute 0.6 0.2 - 1.0 K/uL   Eosinophils Relative 0 %   Eosinophils Absolute 0.0 0 - 0.7 K/uL   Basophils Relative 0 %   Basophils Absolute 0.0 0 - 0.1 K/uL    Comment: Performed at [facility], [address] [place], [place] [address]    Current Facility-Administered Medications  Medication Dose Route Frequency Provider Last Rate Last Dose  . clonazePAM (KLONOPIN) tablet 1 mg  1 mg Oral QHS Nena Polio, MD      . cloZAPine (CLOZARIL) tablet 800 mg  800 mg Oral QHS Nena Polio, MD      . desmopressin (DDAVP) tablet 0.2 mg  0.2 mg Oral QHS Nena Polio, MD      . docusate sodium (COLACE) capsule 100 mg  100 mg Oral BID Nena Polio, MD   100 mg at 02/09/18 4166  . ferrous sulfate tablet 325 mg  325 mg Oral Q breakfast Nena Polio, MD   325 mg at 02/09/18 0630  . imipramine (TOFRANIL) tablet 175 mg  175 mg Oral QHS Nena Polio, MD      . lithium carbonate capsule 600 mg  600 mg Oral QHS Nena Polio, MD      . loratadine (CLARITIN) tablet 10 mg  10 mg Oral Daily Loney Hering, MD   10 mg at 02/09/18 1601  . metFORMIN (GLUCOPHAGE) tablet 500 mg  500 mg Oral BID WC Nena Polio, MD   500 mg at 02/09/18 0932  . metoprolol succinate (TOPROL-XL) 24 hr tablet 150 mg  150 mg Oral Daily Nena Polio, MD   150 mg at 02/09/18 3557  . nicotine (NICODERM CQ - dosed in mg/24  hours) patch 21 mg  21 mg Transdermal Daily Clapacs, Madie Reno, MD   21 mg at 02/09/18 1500  . OLANZapine (ZYPREXA) tablet 10 mg  10 mg Oral Daily Nena Polio, MD   10 mg at 02/09/18 3220  . OLANZapine (ZYPREXA) tablet 10 mg  10 mg Oral QHS Nena Polio, MD      . polyethylene glycol (MIRALAX / GLYCOLAX) packet 17 g  17 g Oral Daily Nena Polio, MD   17 g at 02/09/18 2542  . senna (SENOKOT) tablet 17.2 mg  2 tablet Oral QHS Nena Polio, MD      . venlafaxine [facility]) tablet 75 mg  75 mg Oral BID Nena Polio, MD   75 mg at 02/09/18 7062  . vitamin C (ASCORBIC ACID) tablet 250 mg  250 mg Oral Daily Nena Polio, MD   250 mg  at 02/09/18 4782   Current Outpatient Medications  Medication Sig Dispense Refill  . clonazePAM (KLONOPIN) 1 MG tablet Take 1 mg by mouth at bedtime.    . cloZAPine (CLOZARIL) 100 MG tablet Take 200MG by mouth every morning and 800MG by mouth every night at bedtime    . desmopressin (DDAVP) 0.2 MG tablet Take 0.2 mg by mouth 3 (three) times daily.     Marland Kitchen docusate sodium (COLACE) 100 MG capsule Take 100 mg by mouth at bedtime.     Marland Kitchen EPINEPHrine (EPIPEN 2-PAK) 0.3 mg/0.3 mL IJ SOAJ injection Inject 0.3 mg into the muscle as directed.    . ferrous sulfate 325 (65 FE) MG tablet Take 325 mg by mouth daily with breakfast.    . imipramine (TOFRANIL) 50 MG tablet Take 175 mg by mouth at bedtime.    Marland Kitchen lithium carbonate (LITHOBID) 300 MG CR tablet Take 600 mg by mouth at bedtime.    Marland Kitchen loratadine (CLARITIN) 10 MG tablet Take 10 mg by mouth daily.     . metFORMIN (GLUCOPHAGE) 500 MG tablet Take 500 mg by mouth 2 (two) times daily with a meal.    . metoprolol succinate (TOPROL-XL) 100 MG 24 hr tablet Take 150 mg by mouth daily. Take with or immediately following a meal.    . Multiple Vitamins-Minerals (THEREMS-M) TABS Take 1 tablet by mouth every morning.    Marland Kitchen OLANZapine zydis (ZYPREXA ZYDIS) 10 MG disintegrating tablet Dissolve 2 tablets (25MG) under the tongue every  night at bedtime    . perphenazine (TRILAFON) 8 MG tablet Take 16 mg by mouth 2 (two) times daily.    . polyethylene glycol (MIRALAX / GLYCOLAX) packet Take 17 g by mouth every morning.     . senna (SENOKOT) 8.6 MG TABS tablet Take 1 tablet by mouth every morning.     . venlafaxine XR (EFFEXOR-XR) 75 MG 24 hr capsule Take 225 mg by mouth daily with breakfast.    . vitamin C (ASCORBIC ACID) 250 MG tablet Take 250 mg by mouth daily.      Musculoskeletal: Strength & Muscle Tone: within normal limits Gait & Station: normal Patient leans: N/A  Psychiatric Specialty Exam: Physical Exam  Nursing note and vitals reviewed. Constitutional: He appears well-developed and well-nourished.  HENT:  Head: Normocephalic and atraumatic.  Eyes: Pupils are equal, round, and reactive to light. Conjunctivae are normal.  Neck: Normal range of motion.  Cardiovascular: Regular rhythm and normal heart sounds.  Respiratory: Effort normal. No respiratory distress.  GI: Soft.  Musculoskeletal: Normal range of motion.  Neurological: He is alert.  Skin: Skin is warm and dry.  Psychiatric: His affect is blunt. His speech is delayed. He is slowed and withdrawn. Thought content is paranoid. Cognition and memory are impaired. He expresses inappropriate judgment.    Review of Systems  Unable to perform ROS: Psychiatric disorder    Blood pressure 118/74, pulse (!) 105, temperature 98.6 F (37 C), temperature source Oral, resp. rate 17, height 5' 11"  (1.803 m), weight 99.8 kg (220 lb), SpO2 98 %.Body mass index is 30.68 kg/m.  General Appearance: Casual  Eye Contact:  Fair  Speech:  Blocked and Slow  Volume:  Decreased  Mood:  Dysphoric  Affect:  Blunt  Thought Process:  Disorganized  Orientation:  Negative  Thought Content:  Illogical  Suicidal Thoughts:  Patient will not answer this question  Homicidal Thoughts:  He will not answer this 1 either  Memory:  Immediate;  Fair Recent;   Fair Remote;   Fair   Judgement:  Poor  Insight:  Shallow  Psychomotor Activity:  Restlessness  Concentration:  Concentration: Poor  Recall:  Poor  Fund of Knowledge:  Poor  Language:  Poor  Akathisia:  Possibly.  He did get some extra antipsychotic this morning.  Not clear to me but I think he might have a little akathisia  Handed:  Right  AIMS (if indicated):     Assets:  Desire for Improvement Physical Health Resilience  ADL's:  Impaired  Cognition:  Impaired,  Mild and Moderate  Sleep:        Treatment Plan Summary: Daily contact with patient to assess and evaluate symptoms and progress in treatment, Medication management and Plan This is a 32 year old man with a history of chronic psychotic disorder who comes to the emergency room distressed but unable to articulate precisely why.  He has not been aggressive since he has been here but he has been pacing and looks very upset.  Keeps trying to explain to various staff members what is on his mind but can never actually articulated.  Patient does seem to have had a return of psychotic symptoms.  Plan is for admission to the psychiatric ward.  All labs will be obtained.  We will continue his usual outpatient medicine.  We are likely to probably do ECT while he is here as well.  Disposition: Recommend psychiatric Inpatient admission when medically cleared. Supportive therapy provided about ongoing stressors.  Alethia Berthold, MD 02/09/2018 4:38 PM

## 2018-02-09 NOTE — BH Assessment (Addendum)
Per Arnaldo NatalMalinda, Paul F, MD: "Patient initially feeling anxious given 1 mg of Ativan p.o.  Then he begins feeling more anxious and feels like he wants to hurt himself he was offered more p.o. medicine but he wants a shot because he feels like he is going to hurt the staff.  He is been pacing in the room and then standing at the doorway looks fairly upset.  I will give him Haldol IM." 02/09/18 1038  This writer attempted to complete assessment with pt but was unable due to pt's agitated and homicidal state. Pt's RN Dorothy administered 10mg  of Haldol via IM and pt is resting comfortably.

## 2018-02-10 ENCOUNTER — Other Ambulatory Visit: Payer: Self-pay | Admitting: Psychiatry

## 2018-02-10 DIAGNOSIS — F250 Schizoaffective disorder, bipolar type: Secondary | ICD-10-CM

## 2018-02-10 LAB — LIPID PANEL
Cholesterol: 167 mg/dL (ref 0–200)
HDL: 38 mg/dL — ABNORMAL LOW (ref >40)
LDL Cholesterol: 95 mg/dL (ref 0–99)
Total CHOL/HDL Ratio: 4.4 RATIO
Triglycerides: 169 mg/dL — ABNORMAL HIGH (ref <150)
VLDL: 34 mg/dL (ref 0–40)

## 2018-02-10 LAB — TSH: TSH: 2.187 u[IU]/mL (ref 0.350–4.500)

## 2018-02-10 MED ORDER — OLANZAPINE 5 MG PO TABS
15.0000 mg | ORAL_TABLET | Freq: Every day | ORAL | Status: DC
  Administered 2018-02-10 – 2018-02-12 (×3): 15 mg via ORAL
  Filled 2018-02-10 (×4): qty 1

## 2018-02-10 NOTE — Consult Note (Signed)
ECT: 32 year old man well-known to the ECT service who has been receiving maintenance ECT as part of his treatment.  Came into the hospital through the emergency room with apparent decompensation of his psychotic symptoms.  Seen this evening the patient says he is "all right".  He is still at a loss to clearly describe what brought him in but is a little more reactive than he was in the emergency room.  Talks about how it was "lies and stuff".  Calm not agitated but not very coherent.  Patient had benefited greatly from ECT in addition to his medicine.  I reminded him of this and proposed that we have him on the schedule for ECT tomorrow morning.  I have put in orders for that and the ECT team is aware of that.  As of tomorrow then I can take over care in the hospital.  I reviewed the notes sent over by his outpatient doctor about his medication.  What I had noticed when he came into the hospital as well as that he had recently had Trilafon added to his medicine which I think may have been making him more stiff and have akathisia.

## 2018-02-10 NOTE — H&P (Addendum)
Psychiatric Admission Assessment Adult  Patient Identification: Jonathan Alexander MRN:  [id number] Date of Evaluation:  02/10/2018 Chief Complaint:  "I was just feeling sad." Principal Diagnosis: Schizoaffective disorder, bipolar type [facility]) Diagnosis:   Patient Active Problem List   Diagnosis Date Noted  . Schizoaffective disorder, bipolar type (HCC) [F25.0] 02/09/2018  . Hypertension [I10] 02/09/2018   History of Present Illness:  Jonathan Alexander is a 32 yo male with history of schizoaffective disorder, bipolar type who is currently depressed.  Pt lives at [facility] group home. Pt called 911 himself due to feeling "sad".  Pt has significant thought blocking and delayed responses.  Pt told ED psychiatric consultant that he cannot get "true comfort".  Pt has been compliant with his current medications.  He receives maintenance ECT provided by Dr. Toni Amend, with his most recent treatment 1 week ago.  Pt denies any specific stressors, but would like help to feel better.    Collateral information was obtained by pt's guardian from the [place] [employer] [place], Jonathan Alexander [phone].  Mr. Jonathan Alexander reports pt has a long history of mental illness.  Mr. Jonathan Alexander has been assigned to pt for the past 3 yrs.  Pt is becoming more insightful when he is about to decompensate psychiatrically, so Mr. Jonathan Alexander is glad pt reached out for help.  Signs that pt is about to decompensate are when pt starts becoming hyper-religious or stating that "God is displeased with him for not bathing or taking care of himself."    Addendum: per note from Dr. Toni Amend, trilafon recently added to pt's medication regimen which may be contributing to akathisia.     Associated Signs/Symptoms: Depression Symptoms:  depressed mood, psychomotor retardation, difficulty concentrating, (Hypo) Manic Symptoms:  N/A Anxiety Symptoms:  N/A Psychotic Symptoms:  Paranoia, PTSD Symptoms: NA Total Time spent with patient, reviewing  patient's chart, discussing pt with treatment team and guardian:  65 min   Medical hx: HTN  Social history: lives at [facility] in [place]; has a legal guardian through [employer] [place]  Past Psychiatric History:  Diagnoses: Schizoaffective disorder, bipolar type; depression Prior hospitalizations: [facility] for psychosis, agitation, and paranoia Treatments: Clozaril, ECT, zxyprexa, imipramine, Effexor, lithium; recent addition of trilafon  Is the patient at risk to self? Yes.  , due to psychosis and depressed mood  Has the patient been a risk to self in the past 6 months? unknown Has the patient been a risk to self within the distant past? uknown  Is the patient a risk to others? No.  Has the patient been a risk to others in the past 6 months? unknown Has the patient been a risk to others within the distant past? Yes.   -hx of past aggression  Alcohol Screening: Patient refused Alcohol Screening Tool: Yes 1. How often do you have a drink containing alcohol?: Never 2. How many drinks containing alcohol do you have on a typical day when you are drinking?: 1 or 2 3. How often do you have six or more drinks on one occasion?: Never AUDIT-C Score: 0 4. How often during the last year have you found that you were not able to stop drinking once you had started?: Never 5. How often during the last year have you failed to do what was normally expected from you becasue of drinking?: Never 6. How often during the last year have you needed a first drink in the morning to get yourself going after a heavy drinking session?:  Never 7. How often during the last year have you had a feeling of guilt of remorse after drinking?: Never 8. How often during the last year have you been unable to remember what happened the night before because you had been drinking?: Never 9. Have you or someone else been injured as a result of your drinking?: No 10. Has a relative or friend or a  doctor or another health worker been concerned about your drinking or suggested you cut down?: No Alcohol Use Disorder Identification Test Final Score (AUDIT): 0 Intervention/Follow-up: AUDIT Score <7 follow-up not indicated Substance Abuse History in the last 12 months:  No. Consequences of Substance Abuse: NA Previous Psychotropic Medications: see psychiatric history above Psychological Evaluations: Yes  Past Medical History:  Past Medical History:  Diagnosis Date  . Schizophrenia (HCC)   . Tachycardia    History reviewed. No pertinent surgical history. Family History:  Family History  Family history unknown: Yes   Family Psychiatric  History: unknown Tobacco Screening: Have you used any form of tobacco in the last 30 days? (Cigarettes, Smokeless Tobacco, Cigars, and/or Pipes): Yes Tobacco use, Select all that apply: 5 or more cigarettes per day Are you interested in Tobacco Cessation Medications?: Yes, will notify MD for an order Counseled patient on smoking cessation including recognizing danger situations, developing coping skills and basic information about quitting provided: Yes Social History:  Social History   Substance and Sexual Activity  Alcohol Use No     Social History   Substance and Sexual Activity  Drug Use No     Allergies:   Allergies  Allergen Reactions  . Divalproex Sodium   . Shellfish-Derived Products    Lab Results:  Results for orders placed or performed during the hospital encounter of 02/09/18 (from the past 48 hour(s))  Lipid panel     Status: Abnormal   Collection Time: 02/09/18  5:50 AM  Result Value Ref Range   Cholesterol 167 0 - 200 mg/dL   Triglycerides 161169 (H) <150 mg/dL   HDL 38 (L) >09>40 mg/dL   Total CHOL/HDL Ratio 4.4 RATIO   VLDL 34 0 - 40 mg/dL   LDL Cholesterol 95 0 - 99 mg/dL    Comment:        Total Cholesterol/HDL:CHD Risk Coronary Heart Disease Risk Table                     Men   Women  1/2 Average Risk   3.4   3.3   Average Risk       5.0   4.4  2 X Average Risk   9.6   7.1  3 X Average Risk  23.4   11.0        Use the calculated Patient Ratio above and the CHD Risk Table to determine the patient's CHD Risk.        ATP III CLASSIFICATION (LDL):  <100     mg/dL   Optimal  [id number]  mg/dL   Near or Above                    Optimal  130-159  mg/dL   Borderline  [id number]  mg/dL   High  >478>190     mg/dL   Very High Performed at [facility], [address] [place], [place] [address]   TSH     Status: None   Collection Time: 02/09/18  5:50 AM  Result Value Ref Range  TSH 2.187 0.350 - 4.500 uIU/mL    Comment: Performed by a 3rd Generation assay with a functional sensitivity of <=0.01 uIU/mL. Performed at [facility], [address] [place], [place] [address]     Blood Alcohol level:  Lab Results  Component Value Date   [facility] <10 02/09/2018    Metabolic Disorder Labs:  No results found for: HGBA1C, MPG No results found for: PROLACTIN Lab Results  Component Value Date   CHOL 167 02/09/2018   TRIG 169 (H) 02/09/2018   HDL 38 (L) 02/09/2018   CHOLHDL 4.4 02/09/2018   VLDL 34 02/09/2018   LDLCALC 95 02/09/2018    Current Medications: Current Facility-Administered Medications  Medication Dose Route Frequency Provider Last Rate Last Dose  . acetaminophen (TYLENOL) tablet 650 mg  650 mg Oral Q6H PRN Clapacs, John T, MD      . alum & mag hydroxide-simeth (MAALOX/MYLANTA) 200-200-20 MG/5ML suspension 30 mL  30 mL Oral Q4H PRN Clapacs, John T, MD      . clonazePAM (KLONOPIN) tablet 1 mg  1 mg Oral QHS Clapacs, John T, MD      . cloZAPine (CLOZARIL) tablet 800 mg  800 mg Oral QHS Clapacs, John T, MD      . desmopressin (DDAVP) tablet 0.2 mg  0.2 mg Oral QHS Clapacs, John T, MD      . docusate sodium (COLACE) capsule 100 mg  100 mg Oral BID Clapacs, Jackquline Denmark, MD   100 mg at 02/10/18 1748  . ferrous sulfate tablet 325 mg  325 mg Oral Q breakfast Clapacs, John T, MD   325 mg at  02/10/18 0913  . hydrOXYzine (ATARAX/VISTARIL) tablet 50 mg  50 mg Oral TID PRN Clapacs, John T, MD      . imipramine (TOFRANIL) tablet 175 mg  175 mg Oral QHS Clapacs, John T, MD      . lithium carbonate capsule 600 mg  600 mg Oral QHS Clapacs, John T, MD      . loratadine (CLARITIN) tablet 10 mg  10 mg Oral Daily Clapacs, Jackquline Denmark, MD   10 mg at 02/10/18 0917  . magnesium hydroxide (MILK OF MAGNESIA) suspension 30 mL  30 mL Oral Daily PRN Clapacs, John T, MD      . metFORMIN (GLUCOPHAGE) tablet 500 mg  500 mg Oral BID WC Clapacs, Jackquline Denmark, MD   500 mg at 02/10/18 1748  . metoprolol succinate (TOPROL-XL) 24 hr tablet 150 mg  150 mg Oral Daily Clapacs, John T, MD   150 mg at 02/10/18 0911  . nicotine (NICODERM CQ - dosed in mg/24 hours) patch 21 mg  21 mg Transdermal Daily Clapacs, John T, MD      . OLANZapine (ZYPREXA) tablet 10 mg  10 mg Oral Daily Clapacs, Jackquline Denmark, MD   10 mg at 02/10/18 0912  . OLANZapine (ZYPREXA) tablet 15 mg  15 mg Oral QHS Clapacs, John T, MD      . polyethylene glycol (MIRALAX / GLYCOLAX) packet 17 g  17 g Oral Daily Clapacs, John T, MD      . senna (SENOKOT) tablet 17.2 mg  2 tablet Oral QHS Clapacs, John T, MD      . traZODone (DESYREL) tablet 100 mg  100 mg Oral QHS PRN Clapacs, John T, MD      . venlafaxine [facility]) tablet 75 mg  75 mg Oral BID Clapacs, Jackquline Denmark, MD   75 mg at 02/10/18 1748  .  vitamin C (ASCORBIC ACID) tablet 250 mg  250 mg Oral Daily Clapacs, Jackquline Denmark, MD   250 mg at 02/10/18 0911   PTA Medications: Medications Prior to Admission  Medication Sig Dispense Refill Last Dose  . clonazePAM (KLONOPIN) 1 MG tablet Take 1 mg by mouth at bedtime.   02/08/2018 at 2000  . cloZAPine (CLOZARIL) 100 MG tablet Take 200MG  by mouth every morning and 800MG  by mouth every night at bedtime   02/09/2018 at 0600  . desmopressin (DDAVP) 0.2 MG tablet Take 0.2 mg by mouth 3 (three) times daily.    02/09/2018 at 0600  . docusate sodium (COLACE) 100 MG capsule Take 100 mg by mouth at  bedtime.    02/08/2018 at 2000  . EPINEPHrine (EPIPEN 2-PAK) 0.3 mg/0.3 mL IJ SOAJ injection Inject 0.3 mg into the muscle as directed.   PRN at PRN  . ferrous sulfate 325 (65 FE) MG tablet Take 325 mg by mouth daily with breakfast.   02/09/2018 at 0600  . imipramine (TOFRANIL) 50 MG tablet Take 175 mg by mouth at bedtime.   02/08/2018 at 2000  . lithium carbonate (LITHOBID) 300 MG CR tablet Take 600 mg by mouth at bedtime.   02/08/2018 at 2100  . loratadine (CLARITIN) 10 MG tablet Take 10 mg by mouth daily.    02/08/2018 at 0600  . metFORMIN (GLUCOPHAGE) 500 MG tablet Take 500 mg by mouth 2 (two) times daily with a meal.   02/09/2018 at 0600  . metoprolol succinate (TOPROL-XL) 100 MG 24 hr tablet Take 150 mg by mouth daily. Take with or immediately following a meal.   02/09/2018 at 0600  . Multiple Vitamins-Minerals (THEREMS-M) TABS Take 1 tablet by mouth every morning.   02/09/2018 at 0600  . OLANZapine zydis (ZYPREXA ZYDIS) 10 MG disintegrating tablet Dissolve 2 tablets (25MG ) under the tongue every night at bedtime   02/08/2018 at 2000  . perphenazine (TRILAFON) 8 MG tablet Take 16 mg by mouth 2 (two) times daily.   02/09/2018 at 0600  . polyethylene glycol (MIRALAX / GLYCOLAX) packet Take 17 g by mouth every morning.    02/09/2018 at 0600  . senna (SENOKOT) 8.6 MG TABS tablet Take 1 tablet by mouth every morning.    02/09/2018 at 0600  . venlafaxine XR (EFFEXOR-XR) 75 MG 24 hr capsule Take 225 mg by mouth daily with breakfast.   02/09/2018 at 0600  . vitamin C (ASCORBIC ACID) 250 MG tablet Take 250 mg by mouth daily.   02/09/2018 at 0600    Musculoskeletal: Strength & Muscle Tone: within normal limits Gait & Station: ambulates without difficulty Patient leans: N/A  Psychiatric Specialty Exam: Physical Exam  Constitutional: He is oriented to person, place, and time. He appears well-developed and well-nourished.  HENT:  Head: Normocephalic and atraumatic.  Eyes: Pupils are equal, round, and reactive to light.   Left eye strabismus   Cardiovascular: Normal rate and regular rhythm.  Respiratory: Effort normal and breath sounds normal.  GI: Soft. Bowel sounds are normal.  Neurological: He is alert and oriented to person, place, and time.  Skin: Skin is warm and dry.    Review of Systems  Constitutional: Negative for chills, diaphoresis and fever.  HENT: Negative for congestion and sore throat.   Eyes: Negative for pain.  Respiratory: Negative for shortness of breath.   Cardiovascular: Negative for chest pain and palpitations.  Gastrointestinal: Negative for abdominal pain, constipation, diarrhea, nausea and vomiting.  Musculoskeletal: Positive for back pain (  lower back pain x 2 months).  Skin: Negative for rash.  Neurological: Negative for dizziness and weakness.  Psychiatric/Behavioral: Positive for depression. Negative for substance abuse and suicidal ideas. The patient does not have insomnia.        Paranoia     Blood pressure 117/71, pulse 86, temperature 98.1 F (36.7 C), temperature source Oral, resp. rate 18, height 5\' 9"  (1.753 m), weight 93.9 kg (207 lb), SpO2 100 %.Body mass index is 30.57 kg/m.  General Appearance: dressed in patient scrubs and skid protective socks; left eye strabismus  Eye Contact:  Fair  Speech:  Blocked and delayed responses  Volume:  Decreased  Mood:  Depressed  Affect:  Depressed and Flat  Thought Process: thought blocking  Orientation:  Other:  oriented to person, place, grossly to time  Thought Content:  Illogical and Paranoid Ideation  Suicidal Thoughts:  pt denies  Homicidal Thoughts:  pt denies  Memory:  grossly intact  Judgement:  Other:    Other:  poor, but improving as pt new to tell staff when he was getting depressed which is an improvement according to his guardian  Insight: poor, but improving  Psychomotor Activity:  Decreased  Concentration:  Concentration: Poor  Recall:  impaired  Fund of Knowledge:  difficult to assess due to thought  blocking  Language:  poverty of speech, but when pt did speak his language was fair  Akathisia:  tremor at rest in bilateral hands  AIMS (if indicated):      no stiffness, but pt with bilateral hand tremor  Assets:  Desire for Improvement Housing Others:  has guardian  ADL's:  decreased  Cognition:  Impaired,  Mild  Sleep:  Number of Hours: 7    Treatment Plan Summary: Pt with long hx of mental illness, psychosis; pt presents depressed; has significant thought blocking.  Pt in need of psychiatric stabilization.   Daily contact with patient to assess and evaluate symptoms and progress in treatment, Medication management and Plan 1) Schizoffective disorder, bipolar type, most recent episode depressed-continue with clozaril, lithium and zyprexa as ordered.  ECT consult with Dr. Toni Amend  Klonopin 1 mg qhs Imipramine 175 mg qhs Effexor 75 mg BID  2) HTN -continue toprol XL  Observation Level/Precautions:  15 minute checks  Laboratory:  CBC Chemistry Profile HbAIC UDS UA lipid panel  Psychotherapy:    Medications:    Consultations:    Discharge Concerns:    Estimated LOS:  Other:     Physician Treatment Plan for Primary Diagnosis: Schizoaffective disorder, bipolar type (HCC) Long Term Goal(s): Improvement in symptoms so as ready for discharge  Short Term Goals: Ability to verbalize feelings will improve  Physician Treatment Plan for Secondary Diagnosis: Principal Problem:   Schizoaffective disorder, bipolar type (HCC)  Long Term Goal(s): Improvement in symptoms so as ready for discharge  Short Term Goals: Ability to identify changes in lifestyle to reduce recurrence of condition will improve  I certify that inpatient services furnished can reasonably be expected to improve the patient's condition.    Hessie Knows, MD 7/9/20196:16 PM

## 2018-02-10 NOTE — Progress Notes (Signed)
Recreation Therapy Notes  INPATIENT RECREATION THERAPY ASSESSMENT  Patient Details Name: Jonathan Alexander MRN: [id number] DOB: 07/25/1986 Today's Date: 02/10/2018       Information Obtained From: Patient  Able to Participate in Assessment/Interview: Yes  Patient Presentation: Responsive  Reason for Admission (Per Patient): Other (Comments)(confusion)  Patient Stressors:    Coping Skills:   Musicianrayer  Leisure Interests (2+):  Music - Listen(Eat)  Frequency of Recreation/Participation: Monthly  Awareness of Community Resources:  Yes  Community Resources:  Park  Current Use:    If no, Barriers?:    Expressed Interest in [employer] Information:    [place]  Patient Main Form of Transportation: (I do not go away where)  Patient Strengths:  Knowledge  Patient Identified Areas of Improvement:  Working  Patient Goal for Hospitalization:  That is personal  Current SI (including self-harm):  No  Current HI:  No  Current AVH: No  Staff Intervention Plan: Group Attendance, Collaborate with Interdisciplinary Treatment Team  Consent to Intern Participation: N/A  Vickey Boak 02/10/2018, 4:00 PM

## 2018-02-10 NOTE — Progress Notes (Signed)
Nurse provided patient with UA cup with instructions. Patient verbalized understanding and will bring UA sample to Nurses station once completed.

## 2018-02-10 NOTE — Plan of Care (Signed)
Patient is alert to self, place, and situation. Patient denies SI and HI this morning but never answered when asked about hearing voices or visual hallucinations. Patient missed breakfast this morning due to sleeping. Patient had to be reoriented to his room this morning due to being on the wrong hall looking for his room. Patient is preoccupied and also guarded , communication is minimal. Patient seems to have some confusion as well.  Patient refused nicotine patch during morning medications. Patient asked nurse," What's the deal with showers here I don't like to take them." Therefore, nurse gave patient a paper pal to wash body, with supplies Patient replied, No I will just take a shower I don't need soap only toothpaste." Nurse will encourage group activity and eating of meals today. Problem: Education: Goal: Knowledge of [place] [facility] Education information/materials will improve Outcome: Progressing Goal: Emotional status will improve Outcome: Not Progressing Goal: Mental status will improve Outcome: Not Progressing Goal: Verbalization of understanding the information provided will improve Outcome: Progressing   Problem: Activity: Goal: Interest or engagement in activities will improve Outcome: Not Progressing Goal: Sleeping patterns will improve Outcome: Not Progressing   Problem: Coping: Goal: Ability to verbalize frustrations and anger appropriately will improve Outcome: Not Progressing Goal: Ability to demonstrate self-control will improve Outcome: Not Progressing

## 2018-02-10 NOTE — [facility] Group Notes (Signed)
[facility] Group Notes:  (Nursing/MHT/Case Management/Adjunct)  Date:  02/10/2018  Time:  9:46 PM  Type of Therapy:  Group Therapy  Participation Level:  Did Not Attend    Jonathan NeighborsKeith D [name] 02/10/2018, 9:46 PM

## 2018-02-10 NOTE — [facility] Suicide Risk Assessment (Signed)
[facility] INPATIENT:  Family/Significant Other Suicide Prevention Education  Suicide Prevention Education:  Education Completed; Jonathan Alexander, [employer] [place] Guardian,  (name of family member/significant other) has been identified by the patient as the family member/significant other with whom the patient will be residing, and identified as the person(s) who will aid the patient in the event of a mental health crisis (suicidal ideations/suicide attempt).  With written consent from the patient, the family member/significant other has been provided the following suicide prevention education, prior to the and/or following the discharge of the patient.  The suicide prevention education provided includes the following:  Suicide risk factors  Suicide prevention and interventions  National Suicide Hotline telephone number  [facility] assessment telephone number  [facility] Emergency Assistance 911  [facility] and/or [facility]idential [facility] telephone number  Request made of family/significant other to:  Remove weapons (e.g., guns, rifles, knives), all items previously/currently identified as safety concern.    Remove drugs/medications (over-the-counter, prescriptions, illicit drugs), all items previously/currently identified as a safety concern.  The family member/significant other verbalizes understanding of the suicide prevention education information provided.  The family member/significant other agrees to remove the items of safety concern listed above.  Jonathan DaubSara P Sricharan Lacomb, LCSW 02/10/2018, 5:33 PM

## 2018-02-10 NOTE — [facility] Group Notes (Signed)
02/10/2018 1PM  Type of Therapy/Topic:  Group Therapy:  Feelings about Diagnosis  Participation Level:  Did Not Attend   Description of Group:   This group will allow patients to explore their thoughts and feelings about diagnoses they have received. Patients will be guided to explore their level of understanding and acceptance of these diagnoses. Facilitator will encourage patients to process their thoughts and feelings about the reactions of others to their diagnosis and will guide patients in identifying ways to discuss their diagnosis with significant others in their lives. This group will be process-oriented, with patients participating in exploration of their own experiences, giving and receiving support, and processing challenge from other group members.   Therapeutic Goals: 1. Patient will demonstrate understanding of diagnosis as evidenced by identifying two or more symptoms of the disorder 2. Patient will be able to express two feelings regarding the diagnosis 3. Patient will demonstrate their ability to communicate their needs through discussion and/or role play  Summary of Patient Progress: Patient was encouraged and invited to attend group. Patient did not attend group. Social worker will continue to encourage group participation in the future.        Therapeutic Modalities:   Cognitive Behavioral Therapy Brief Therapy Feelings Identification    Lanora Reveron, LCSW 02/10/2018 2:46 PM  

## 2018-02-10 NOTE — Progress Notes (Signed)
Pt. Proceeded to go straight to bed after completing admissions.   Precautionary checks every 15 minutes for safety maintained, room free of safety hazards, patient sustains no injury or falls during this shift.

## 2018-02-10 NOTE — Progress Notes (Signed)
Recreation Therapy Notes  Date: 02/10/2018  Time: 9:30 am   Location: Craft Room   Behavioral response: N/A   Intervention Topic:  Self-Esteem  Discussion/Intervention: Patient did not attend group.   Clinical Observations/Feedback:  Patient did not attend group.   Satara Virella LRT/CTRS         Ericah Scotto 02/10/2018 10:29 AM

## 2018-02-10 NOTE — BHH Suicide Risk Assessment (Signed)
[facility] Admission Suicide Risk Assessment   Nursing information obtained from:  Patient Demographic factors:  Male, Low socioeconomic status, Unemployed Current Mental Status:  NA Loss Factors:  NA Historical Factors:  NA Risk Reduction Factors:  NA  Total Time spent with patient, reviewing patient's chart, discussing pt with treatment team and guardian: 65 min Principal Problem: Schizoaffective disorder, bipolar type (HCC) Diagnosis:   Patient Active Problem List   Diagnosis Date Noted  . Schizoaffective disorder, bipolar type (HCC) [F25.0] 02/09/2018  . Hypertension [I10] 02/09/2018   Subjective Data:  Jonathan Alexander is a 32 yo male with history of schizoaffective disorder, bipolar type who is currently depressed.  Pt lives at [facility] group home. Pt called 911 himself due to feeling "sad".  Pt has significant thought blocking and delayed responses.  Pt told ED psychiatric consultant that he cannot get "true comfort".  Pt has been compliant with his current medications.  He receives maintenance ECT provided by Dr. Toni Alexander, with his most recent treatment 1 week ago.  Pt denies any specific stressors, but would like help to feel better.    Collateral information was obtained by pt's guardian from the [place], Jonathan Alexander [phone].  Mr. Jonathan Alexander reports pt has a long history of mental illness.  Mr. Jonathan Alexander has been assigned to pt for the past 3 yrs.  Pt is becoming more insightful when he is about to decompensate psychiatrically, so Mr. Jonathan Alexander is glad pt reached out for help.  Signs that pt is about to decompensate are when pt starts becoming hyper-religious or stating that "God is displeased with him for not bathing or taking care of himself."    Continued Clinical Symptoms:  Alcohol Use Disorder Identification Test Final Score (AUDIT): 0 The "Alcohol Use Disorders Identification Test", Guidelines for Use in Primary Care, Second Edition.  World Science writer  [facility]). Score between 0-7:  no or low risk or alcohol related problems. Score between 8-15:  moderate risk of alcohol related problems. Score between 16-19:  high risk of alcohol related problems. Score 20 or above:  warrants further diagnostic evaluation for alcohol dependence and treatment.   CLINICAL FACTORS:   Depression:   Pt reports feeling sad Schizophrenia:   Paranoid or undifferentiated type   Musculoskeletal: Strength & Muscle Tone: within normal limits Gait & Station: ambulates without difficulty Patient leans: N/A  Psychiatric Specialty Exam: Physical Exam  Constitutional: He is oriented to person, place, and time. He appears well-developed and well-nourished.  HENT:  Head: Normocephalic and atraumatic.  Eyes: Pupils are equal, round, and reactive to light.  Left eye strabismus   Cardiovascular: Normal rate, regular rhythm and normal heart sounds.  Respiratory: Breath sounds normal.  GI: Soft. Bowel sounds are normal.  Neurological: He is alert and oriented to person, place, and time.  Skin: Skin is warm and dry. He is not diaphoretic.    Review of Systems  Constitutional: Negative for chills, diaphoresis and fever.  HENT: Negative for ear pain, sinus pain and sore throat.   Eyes: Negative for pain.  Respiratory: Negative for shortness of breath.   Cardiovascular: Negative for chest pain and palpitations.  Gastrointestinal: Negative for abdominal pain, constipation, diarrhea, nausea and vomiting.  Genitourinary: Negative for dysuria.  Musculoskeletal: Positive for back pain (lower back pain x 2 months, worse with sitting).  Skin: Negative for rash.  Neurological: Negative for dizziness and weakness.  Psychiatric/Behavioral: Positive for depression. Negative for substance abuse and suicidal ideas. The patient does  not have insomnia.        Paranoia, religiosity     Blood pressure 122/82, pulse 96, temperature 98.1 F (36.7 C), temperature source Oral, resp. rate  20, height 5\' 9"  (1.753 m), weight 93.9 kg (207 lb), SpO2 100 %.Body mass index is 30.57 kg/m.  General Appearance: dressed in patient scrubs and skid protective socks; left eye strabismus  Eye Contact:  Fair  Speech:  responses delayed  Volume:  Decreased  Mood:  Depressed  Affect:  Depressed and Flat  Thought Process:  Thought blocking  Orientation:  Other:  oriented to person, place, grossly to time  Thought Content:  Illogical and Paranoid Ideation  Suicidal Thoughts:  pt denies  Homicidal Thoughts:  pt denies  Memory:  grossly intact  Judgement:  Other:  improving as pt new to tell staff when he was getting depressed which is an improvement according to his guardian  Insight:  improving  Psychomotor Activity:  Decreased  Concentration:  Concentration: Poor  Recall:  impaired  Fund of Knowledge:  difficult to assess due to thought blocking  Language:  poverty of speech, but when pt did speak his language was fair  Akathisia:  pt has bilateral hand tremor at rest  AIMS (if indicated):   no stiffness, but pt with bilateral hand tremor  Assets:  Desire for Improvement Housing, has guardian  ADL's:  decreased  Cognition:  Impaired,  Mild  Sleep:  Number of Hours: 7      COGNITIVE FEATURES THAT CONTRIBUTE TO RISK:  Pt has been deemed incompetent and has a legal guardian.  SUICIDE RISK:   Mild:  Suicidal ideation of limited frequency, intensity, duration, and specificity.  There are no identifiable plans, no associated intent, mild dysphoria and related symptoms, good self-control (both objective and subjective assessment), few other risk factors, and identifiable protective factors, including available and accessible social support.  PLAN OF CARE:  Medication management, ECT consult, involve guardian Jonathan Alexander(Jonathan Alexander [phone], [place] [employer] [place]) in pt's care and treatment plan; therapeutic milieu of unit.      I certify that inpatient services furnished can reasonably be  expected to improve the patient's condition.   Jonathan KnowsSarita O'Neal, MD 02/10/2018, 5:47 PM

## 2018-02-10 NOTE — BHH Group Notes (Signed)
CSW Group Therapy Note  02/10/2018  Time:  0900  Type of Therapy and Topic: Group Therapy: Goals Group: SMART Goals    Participation Level:  Did Not Attend    Description of Group:   The purpose of a daily goals group is to assist and guide patients in setting recovery/wellness-related goals. The objective is to set goals as they relate to the crisis in which they were admitted. Patients will be using SMART goal modalities to set measurable goals. Characteristics of realistic goals will be discussed and patients will be assisted in setting and processing how one will reach their goal. Facilitator will also assist patients in applying interventions and coping skills learned in psycho-education groups to the SMART goal and process how one will achieve defined goal.    Therapeutic Goals:  -Patients will develop and document one goal related to or their crisis in which brought them into treatment.  -Patients will be guided by LCSW using SMART goal setting modality in how to set a measurable, attainable, realistic and time sensitive goal.  -Patients will process barriers in reaching goal.  -Patients will process interventions in how to overcome and successful in reaching goal.    Patient's Goal:  Pt was invited to attend group but chose not to attend. CSW will continue to encourage pt to attend group throughout their admission.   Therapeutic Modalities:  Motivational Interviewing  Cognitive Behavioral Therapy  Crisis Intervention Model  SMART goals setting  Heidi DachKelsey Justa Hatchell, MSW, LCSW Clinical Social Worker 02/10/2018 9:57 AM

## 2018-02-11 ENCOUNTER — Telehealth: Payer: Self-pay

## 2018-02-11 ENCOUNTER — Inpatient Hospital Stay: Payer: Medicare Other | Admitting: Anesthesiology

## 2018-02-11 ENCOUNTER — Encounter: Payer: Self-pay | Admitting: *Deleted

## 2018-02-11 DIAGNOSIS — I10 Essential (primary) hypertension: Secondary | ICD-10-CM | POA: Diagnosis not present

## 2018-02-11 DIAGNOSIS — Z6830 Body mass index (BMI) 30.0-30.9, adult: Secondary | ICD-10-CM | POA: Diagnosis not present

## 2018-02-11 DIAGNOSIS — F250 Schizoaffective disorder, bipolar type: Principal | ICD-10-CM

## 2018-02-11 DIAGNOSIS — E669 Obesity, unspecified: Secondary | ICD-10-CM | POA: Diagnosis not present

## 2018-02-11 LAB — URINALYSIS, COMPLETE (UACMP) WITH MICROSCOPIC
BACTERIA UA: NONE SEEN
Bilirubin Urine: NEGATIVE
Glucose, UA: NEGATIVE mg/dL
Hgb urine dipstick: NEGATIVE
Ketones, ur: NEGATIVE mg/dL
Leukocytes, UA: NEGATIVE
NITRITE: NEGATIVE
PROTEIN: NEGATIVE mg/dL
Specific Gravity, Urine: 1.018 (ref 1.005–1.030)
pH: 6 (ref 5.0–8.0)

## 2018-02-11 LAB — HEMOGLOBIN A1C
Hgb A1c MFr Bld: 5 % (ref 4.8–5.6)
Mean Plasma Glucose: 97 mg/dL

## 2018-02-11 LAB — GLUCOSE, CAPILLARY: Glucose-Capillary: 110 mg/dL — ABNORMAL HIGH (ref 70–99)

## 2018-02-11 MED ORDER — FENTANYL CITRATE (PF) 100 MCG/2ML IJ SOLN: 25.0000 ug | INTRAMUSCULAR | Status: DC | PRN

## 2018-02-11 MED ORDER — SUCCINYLCHOLINE CHLORIDE 200 MG/10ML IV SOSY
PREFILLED_SYRINGE | INTRAVENOUS | Status: DC | PRN
  Administered 2018-02-11: 100 mg via INTRAVENOUS

## 2018-02-11 MED ORDER — SUCCINYLCHOLINE CHLORIDE 20 MG/ML IJ SOLN
INTRAMUSCULAR | Status: AC
  Filled 2018-02-11: qty 1

## 2018-02-11 MED ORDER — METHOHEXITAL SODIUM 100 MG/10ML IV SOSY
PREFILLED_SYRINGE | INTRAVENOUS | Status: DC | PRN
  Administered 2018-02-11: 70 mg via INTRAVENOUS

## 2018-02-11 MED ORDER — MIDAZOLAM HCL 2 MG/2ML IJ SOLN
INTRAMUSCULAR | Status: AC
  Filled 2018-02-11: qty 2

## 2018-02-11 MED ORDER — SODIUM CHLORIDE 0.9 % IV SOLN
500.0000 mL | Freq: Once | INTRAVENOUS | Status: AC
  Administered 2018-02-11: 500 mL via INTRAVENOUS

## 2018-02-11 MED ORDER — MIDAZOLAM HCL 2 MG/2ML IJ SOLN
1.0000 mg | Freq: Once | INTRAMUSCULAR | Status: AC
  Administered 2018-02-11: 1 mg via INTRAVENOUS

## 2018-02-11 MED ORDER — ONDANSETRON HCL 4 MG/2ML IJ SOLN: 4.0000 mg | Freq: Once | INTRAMUSCULAR | Status: DC | PRN

## 2018-02-11 MED ORDER — GLYCOPYRROLATE 0.2 MG/ML IJ SOLN: 0.2000 mg | Freq: Once | INTRAMUSCULAR | Status: DC

## 2018-02-11 MED ORDER — SODIUM CHLORIDE 0.9 % IV SOLN
INTRAVENOUS | Status: DC | PRN
  Administered 2018-02-11: 11:00:00 via INTRAVENOUS

## 2018-02-11 NOTE — Plan of Care (Signed)
Pt was isolative to his room all evening. Pt denies SI/HI. Pt is receptive to treatment and safety maintained on unit. Patient was able to get up for medications.  Will continue to monitor. Problem: Education: Goal: Emotional status will improve Outcome: Progressing Goal: Mental status will improve Outcome: Progressing Goal: Verbalization of understanding the information provided will improve Outcome: Progressing   Problem: Coping: Goal: Ability to verbalize frustrations and anger appropriately will improve Outcome: Progressing Goal: Ability to demonstrate self-control will improve Outcome: Progressing   Problem: Health Behavior/Discharge Planning: Goal: Compliance with treatment plan for underlying cause of condition will improve Outcome: Progressing   Problem: Physical Regulation: Goal: Ability to maintain clinical measurements within normal limits will improve Outcome: Progressing   Problem: Safety: Goal: Periods of time without injury will increase Outcome: Progressing   Problem: Education: Goal: Will be free of psychotic symptoms Outcome: Progressing

## 2018-02-11 NOTE — Progress Notes (Signed)
Recreation Therapy Notes  Date: 02/11/2018  Time: 9:30 am   Location: Craft Room   Behavioral response: N/A   Intervention Topic:  Coping  Discussion/Intervention: Patient did not attend group.   Clinical Observations/Feedback:  Patient did not attend group.   Nisreen Guise LRT/CTRS         Jarissa Sheriff 02/11/2018 11:53 AM 

## 2018-02-11 NOTE — Procedures (Signed)
ECT SERVICES Physician's Interval Evaluation & Treatment Note  Patient Identification: Jonathan FriendlyDemetrio Alexander MRN:  [id number] Date of Evaluation:  02/11/2018 TX #: [id number]  MADRS:   MMSE:   P.E. Findings:  No change to physical exam.  Heart and lungs normal vitals normal.  Psychiatric Interval Note:  Patient had a return of psychotic symptoms agitation anxiety and depression which brought him back to the hospital.  Subjective:  Patient is a 32 y.o. male seen for evaluation for Electroconvulsive Therapy. Hallucinations and extreme anxiety and agitation  Treatment Summary:   []   Right Unilateral             [x]  Bilateral   % Energy : 1.0 ms 100%   Impedance: 1100 ohms  Seizure Energy Index: 16,290 V squared  Postictal Suppression Index: 95%  Seizure Concordance Index: 97%  Medications  Pre Shock: Robinul 0.2 mg Brevital 70 mg succinylcholine 100 mg  Post Shock: Versed 1 mg  Seizure Duration: 19 seconds EMG 29 seconds EEG   Comments: Presumptive follow-up Friday  Lungs:  [x]   Clear to auscultation               []  Other:   Heart:    [x]   Regular rhythm             []  irregular rhythm    [x]   Previous H&P reviewed, patient examined and there are NO CHANGES                 []   Previous H&P reviewed, patient examined and there are changes noted.   [name], MD 7/10/201911:21 AM

## 2018-02-11 NOTE — Transfer of Care (Signed)
Immediate Anesthesia Transfer of Care Note  Patient: Jonathan Alexander  Procedure(s) Performed: ECT TX  Patient Location: PACU  Anesthesia Type:General  Level of Consciousness: sedated  Airway & Oxygen Therapy: Patient Spontanous Breathing and Patient connected to face mask oxygen  Post-op Assessment: Report given to RN and Post -op Vital signs reviewed and stable  Post vital signs: Reviewed and stable  Last Vitals:  Vitals Value Taken Time  BP 129/76 02/11/2018 11:44 AM  Temp 36.8 C 02/11/2018 11:44 AM  Pulse 113 02/11/2018 11:45 AM  Resp 12 02/11/2018 11:45 AM  SpO2 96 % 02/11/2018 11:45 AM  Vitals shown include unvalidated device data.  Last Pain:  Vitals:   02/11/18 1144  TempSrc:   PainSc: Asleep         Complications: No apparent anesthesia complications

## 2018-02-11 NOTE — Anesthesia Post-op Follow-up Note (Signed)
Anesthesia QCDR form completed.        

## 2018-02-11 NOTE — Plan of Care (Signed)
Patient verbalizes understanding of the general information that's been provided to him and all questions/concerns have been addressed and answered. Patient denies SI/HI/AVH as well as any signs/symptoms of depression/anxiety. Patient has been isolative to his room except for meal times and to make phone calls. Patient has been sleeping on and off throughout the day. Patient has the ability to identify the available resources that can assist him in meeting his health-related needs. Patient verbalizes understanding of and has been in compliance with his prescribed therapeutic regimen and has been working towards maintaining his clinical measurements within normal limits. Patient has been free from injury thus far and remains safe on the unit at this time.  Problem: Education: Goal: Knowledge of [facility] Education information/materials will improve Outcome: Progressing Goal: Emotional status will improve Outcome: Progressing Goal: Mental status will improve Outcome: Progressing Goal: Verbalization of understanding the information provided will improve Outcome: Progressing   Problem: Activity: Goal: Interest or engagement in activities will improve Outcome: Progressing Goal: Sleeping patterns will improve Outcome: Progressing   Problem: Coping: Goal: Ability to verbalize frustrations and anger appropriately will improve Outcome: Progressing Goal: Ability to demonstrate self-control will improve Outcome: Progressing   Problem: Health Behavior/Discharge Planning: Goal: Identification of resources available to assist in meeting health care needs will improve Outcome: Progressing Goal: Compliance with treatment plan for underlying cause of condition will improve Outcome: Progressing   Problem: Physical Regulation: Goal: Ability to maintain clinical measurements within normal limits will improve Outcome: Progressing   Problem: Safety: Goal: Periods of time without injury will  increase Outcome: Progressing   Problem: Education: Goal: Will be free of psychotic symptoms Outcome: Progressing Goal: Knowledge of the prescribed therapeutic regimen will improve Outcome: Progressing   Problem: Health Behavior/Discharge Planning: Goal: Compliance with prescribed medication regimen will improve Outcome: Progressing   Problem: Health Behavior/Discharge Planning: Goal: Identification of resources available to assist in meeting health care needs will improve Outcome: Progressing   Problem: Safety: Goal: Ability to remain free from injury will improve Outcome: Progressing

## 2018-02-11 NOTE — Progress Notes (Signed)
[facility] MD Progress Note  02/11/2018 7:06 PM Jonathan Alexander  MRN:  [id number] Subjective: Follow-up 32 year old man with a history of schizophrenia or schizoaffective disorder.  Patient had ECT today which was uncomplicated.  He tells me subjectively he feels better but as usual has a very difficult time articulating what that means.  When asked to tell me what he is worried about or what is on his mind he says, after a long delay, that it is personal.  Would not discuss religious topics with me.  In terms of behavior however he has remained calm not agitated not aggressive and is cooperative with medicine. Principal Problem: Schizoaffective disorder, bipolar type (HCC) Diagnosis:   Patient Active Problem List   Diagnosis Date Noted  . Schizoaffective disorder, bipolar type (HCC) [F25.0] 02/09/2018  . Hypertension [I10] 02/09/2018   Total Time spent with patient: 30 minutes  Past Psychiatric History: Long history of chronic severe illness with long hospitalizations finally stabilized with ECT  Past Medical History:  Past Medical History:  Diagnosis Date  . Schizophrenia (HCC)   . Tachycardia    History reviewed. No pertinent surgical history. Family History:  Family History  Family history unknown: Yes   Family Psychiatric  History: Unknown Social History:  Social History   Substance and Sexual Activity  Alcohol Use No     Social History   Substance and Sexual Activity  Drug Use No    Social History   Socioeconomic History  . Marital status: Single    Spouse name: Not on file  . Number of children: Not on file  . Years of education: Not on file  . Highest education level: Not on file  Occupational History  . Not on file  Social Needs  . Financial resource strain: Not on file  . Food insecurity:    Worry: Not on file    Inability: Not on file  . Transportation needs:    Medical: Not on file    Non-medical: Not on file  Tobacco Use  . Smoking status: Current Every  Day Smoker    Years: 4.00    Types: Cigarettes  . Smokeless tobacco: Never Used  Substance and Sexual Activity  . Alcohol use: No  . Drug use: No  . Sexual activity: Never  Lifestyle  . Physical activity:    Days per week: Not on file    Minutes per session: Not on file  . Stress: Not on file  Relationships  . Social connections:    Talks on phone: Not on file    Gets together: Not on file    Attends religious service: Not on file    Active member of club or organization: Not on file    Attends meetings of clubs or organizations: Not on file    Relationship status: Not on file  Other Topics Concern  . Not on file  Social History Narrative  . Not on file   Additional Social History:                         Sleep: Fair  Appetite:  Fair  Current Medications: Current Facility-Administered Medications  Medication Dose Route Frequency Provider Last Rate Last Dose  . acetaminophen (TYLENOL) tablet 650 mg  650 mg Oral Q6H PRN Clapacs, John T, MD      . alum & mag hydroxide-simeth (MAALOX/MYLANTA) 200-200-20 MG/5ML suspension 30 mL  30 mL Oral Q4H PRN Clapacs, Jackquline Denmark, MD      .  clonazePAM (KLONOPIN) tablet 1 mg  1 mg Oral QHS Clapacs, Jackquline DenmarkJohn T, MD   1 mg at 02/10/18 2152  . cloZAPine (CLOZARIL) tablet 800 mg  800 mg Oral QHS Clapacs, John T, MD   800 mg at 02/10/18 2151  . desmopressin (DDAVP) tablet 0.2 mg  0.2 mg Oral QHS Clapacs, John T, MD   0.2 mg at 02/10/18 2209  . docusate sodium (COLACE) capsule 100 mg  100 mg Oral BID Clapacs, Jackquline DenmarkJohn T, MD   100 mg at 02/11/18 1637  . ferrous sulfate tablet 325 mg  325 mg Oral Q breakfast Clapacs, Jackquline DenmarkJohn T, MD   325 mg at 02/11/18 1309  . glycopyrrolate (ROBINUL) injection 0.2 mg  0.2 mg Intravenous Once Clapacs, John T, MD      . hydrOXYzine (ATARAX/VISTARIL) tablet 50 mg  50 mg Oral TID PRN Clapacs, John T, MD      . imipramine (TOFRANIL) tablet 175 mg  175 mg Oral QHS Clapacs, Jackquline DenmarkJohn T, MD   175 mg at 02/10/18 2210  . lithium  carbonate capsule 600 mg  600 mg Oral QHS Clapacs, John T, MD   600 mg at 02/10/18 2212  . loratadine (CLARITIN) tablet 10 mg  10 mg Oral Daily Clapacs, Jackquline DenmarkJohn T, MD   10 mg at 02/10/18 0917  . magnesium hydroxide (MILK OF MAGNESIA) suspension 30 mL  30 mL Oral Daily PRN Clapacs, John T, MD      . metFORMIN (GLUCOPHAGE) tablet 500 mg  500 mg Oral BID WC Clapacs, Jackquline DenmarkJohn T, MD   500 mg at 02/11/18 1637  . metoprolol succinate (TOPROL-XL) 24 hr tablet 150 mg  150 mg Oral Daily Clapacs, Jackquline DenmarkJohn T, MD   150 mg at 02/11/18 0620  . OLANZapine (ZYPREXA) tablet 10 mg  10 mg Oral Daily Clapacs, Jackquline DenmarkJohn T, MD   10 mg at 02/11/18 1309  . OLANZapine (ZYPREXA) tablet 15 mg  15 mg Oral QHS Clapacs, Jackquline DenmarkJohn T, MD   15 mg at 02/10/18 2152  . polyethylene glycol (MIRALAX / GLYCOLAX) packet 17 g  17 g Oral Daily Clapacs, John T, MD      . senna (SENOKOT) tablet 17.2 mg  2 tablet Oral QHS Clapacs, John T, MD   17.2 mg at 02/10/18 2152  . traZODone (DESYREL) tablet 100 mg  100 mg Oral QHS PRN Clapacs, Jackquline DenmarkJohn T, MD   100 mg at 02/10/18 2151  . venlafaxine (EFFEXOR) tablet 75 mg  75 mg Oral BID Clapacs, Jackquline DenmarkJohn T, MD   75 mg at 02/11/18 1636  . vitamin C (ASCORBIC ACID) tablet 250 mg  250 mg Oral Daily Clapacs, Jackquline DenmarkJohn T, MD   250 mg at 02/11/18 1308    Lab Results:  Results for orders placed or performed during the hospital encounter of 02/09/18 (from the past 48 hour(s))  Urinalysis, Complete w Microscopic     Status: Abnormal   Collection Time: 02/10/18  9:48 AM  Result Value Ref Range   Color, Urine YELLOW (A) YELLOW   APPearance HAZY (A) CLEAR   Specific Gravity, Urine 1.018 1.005 - 1.030   pH 6.0 5.0 - 8.0   Glucose, UA NEGATIVE NEGATIVE mg/dL   Hgb urine dipstick NEGATIVE NEGATIVE   Bilirubin Urine NEGATIVE NEGATIVE   Ketones, ur NEGATIVE NEGATIVE mg/dL   Protein, ur NEGATIVE NEGATIVE mg/dL   Nitrite NEGATIVE NEGATIVE   Leukocytes, UA NEGATIVE NEGATIVE   RBC / HPF 0-5 0 - 5 RBC/hpf   WBC, UA 6-10  0 - 5 WBC/hpf    Bacteria, UA NONE SEEN NONE SEEN   Squamous Epithelial / LPF 0-5 0 - 5   Mucus PRESENT    Ca Oxalate Crys, UA PRESENT     Comment: Performed at [facility], [address] [place], [place] [address]  Glucose, capillary     Status: Abnormal   Collection Time: 02/11/18  6:31 AM  Result Value Ref Range   Glucose-Capillary 110 (H) 70 - 99 mg/dL    Blood Alcohol level:  Lab Results  Component Value Date   ETH <10 02/09/2018    Metabolic Disorder Labs: Lab Results  Component Value Date   HGBA1C 5.0 02/09/2018   MPG 97 02/09/2018   No results found for: PROLACTIN Lab Results  Component Value Date   CHOL 167 02/09/2018   TRIG 169 (H) 02/09/2018   HDL 38 (L) 02/09/2018   CHOLHDL 4.4 02/09/2018   VLDL 34 02/09/2018   LDLCALC 95 02/09/2018    Physical Findings: AIMS:  , ,  ,  ,    CIWA:    COWS:     Musculoskeletal: Strength & Muscle Tone: within normal limits Gait & Station: normal Patient leans: N/A  Psychiatric Specialty Exam: Physical Exam  Nursing note and vitals reviewed. Constitutional: He appears well-developed and well-nourished.  HENT:  Head: Normocephalic and atraumatic.  Eyes: Pupils are equal, round, and reactive to light. Conjunctivae are normal.  Neck: Normal range of motion.  Cardiovascular: Regular rhythm and normal heart sounds.  Respiratory: Effort normal.  GI: Soft.  Musculoskeletal: Normal range of motion.  Neurological: He is alert.  Skin: Skin is warm and dry.  Psychiatric: His affect is blunt. His speech is delayed. He is slowed. Thought content is paranoid. Cognition and memory are impaired. He expresses inappropriate judgment. He expresses no homicidal and no suicidal ideation.    Review of Systems  Constitutional: Negative.   HENT: Negative.   Eyes: Negative.   Respiratory: Negative.   Cardiovascular: Negative.   Gastrointestinal: Negative.   Musculoskeletal: Negative.   Skin: Negative.   Neurological: Negative.    Psychiatric/Behavioral: Positive for hallucinations. Negative for depression, memory loss, substance abuse and suicidal ideas. The patient is nervous/anxious. The patient does not have insomnia.     Blood pressure (!) 97/57, pulse 97, temperature 98 F (36.7 C), temperature source Oral, resp. rate 18, height 5\' 9"  (1.753 m), weight 93.9 kg (207 lb), SpO2 100 %.Body mass index is 30.57 kg/m.  General Appearance: Casual  Eye Contact:  Good  Speech:  Slow  Volume:  Decreased  Mood:  Euthymic  Affect:  Flat  Thought Process:  Coherent  Orientation:  Full (Time, Place, and Person)  Thought Content:  Rumination  Suicidal Thoughts:  No  Homicidal Thoughts:  No  Memory:  Immediate;   Fair Recent;   Fair Remote;   Fair  Judgement:  Fair  Insight:  Fair  Psychomotor Activity:  Decreased  Concentration:  Concentration: Poor  Recall:  [employer] of Knowledge:  Fair  Language:  Fair  Akathisia:  No  Handed:  Right  AIMS (if indicated):     Assets:  Desire for Improvement Housing Physical Health  ADL's:  Intact  Cognition:  Impaired,  Mild  Sleep:  Number of Hours: 7.45     Treatment Plan Summary: Daily contact with patient to assess and evaluate symptoms and progress in treatment, Medication management and Plan Patient is difficult to assess because of how closed  mouth he is.  In terms of his behavior however he has not been aggressive or dangerous and he is taking care of his ADLs.  He is cooperative with medication.  ECT today was done without difficulty.  I am proposing that we will again treat him on Friday after which if his behavior continues to be good I am going to proposal likely discharge.  No change to medicine for today.  Jonathan Rasmussen, MD 02/11/2018, 7:06 PM

## 2018-02-11 NOTE — [facility] Group Notes (Signed)
LCSW Group Therapy Note  02/11/2018 1:00pm  Type of Therapy/Topic:  Group Therapy:  Emotion Regulation  Participation Level:  Did Not Attend   Description of Group:    The purpose of this group is to assist patients in learning to regulate negative emotions and experience positive emotions. Patients will be guided to discuss ways in which they have been vulnerable to their negative emotions. These vulnerabilities will be juxtaposed with experiences of positive emotions or situations, and patients will be challenged to use positive emotions to combat negative ones. Special emphasis will be placed on coping with negative emotions in conflict situations, and patients will process healthy conflict resolution skills.  Therapeutic Goals: 1. Patient will identify two positive emotions or experiences to reflect on in order to balance out negative emotions 2. Patient will label two or more emotions that they find the most difficult to experience 3. Patient will demonstrate positive conflict resolution skills through discussion and/or role plays  Summary of Patient Progress: Robb was invited to today's group, but chose not to attend.      Therapeutic Modalities:   Cognitive Behavioral Therapy Feelings Identification Dialectical Behavioral Therapy

## 2018-02-11 NOTE — Anesthesia Preprocedure Evaluation (Signed)
Anesthesia Evaluation  Patient identified by MRN, date of birth, ID band Patient awake    Reviewed: Allergy & Precautions, NPO status , Patient's Chart, lab work & pertinent test results, reviewed documented beta blocker date and time   History of Anesthesia Complications Negative for: history of anesthetic complications  Airway Mallampati: II  TM Distance: >3 FB     Dental  (+) Chipped   Pulmonary neg sleep apnea, neg COPD, Current Smoker, former smoker,    breath sounds clear to auscultation- rhonchi (-) wheezing      Cardiovascular Exercise Tolerance: Good hypertension, (-) CAD, (-) Past MI and (-) Cardiac Stents  Rhythm:Regular Rate:Normal - Systolic murmurs and - Diastolic murmurs    Neuro/Psych PSYCHIATRIC DISORDERS Schizophrenia negative neurological ROS     GI/Hepatic negative GI ROS, Neg liver ROS,   Endo/Other  negative endocrine ROSneg diabetes  Renal/GU negative Renal ROS     Musculoskeletal negative musculoskeletal ROS (+)   Abdominal (+) + obese,   Peds  Hematology negative hematology ROS (+)   Anesthesia Other Findings Past Medical History: No date: Schizophrenia (HCC) No date: Tachycardia   Reproductive/Obstetrics                             Anesthesia Physical  Anesthesia Plan  ASA: III  Anesthesia Plan: General   Post-op Pain Management:    Induction: Intravenous  PONV Risk Score and Plan: 2 and Ondansetron  Airway Management Planned: Mask  Additional Equipment:   Intra-op Plan:   Post-operative Plan:   Informed Consent: I have reviewed the patients History and Physical, chart, labs and discussed the procedure including the risks, benefits and alternatives for the proposed anesthesia with the patient or authorized representative who has indicated his/her understanding and acceptance.     Plan Discussed with: CRNA and Anesthesiologist  Anesthesia  Plan Comments:         Anesthesia Quick Evaluation  

## 2018-02-11 NOTE — Progress Notes (Signed)
D- Patient alert and oriented. Patient presents in a drowsy, but pleasant mood on assessment stating that he slept "horrible" last night, but did not elaborate as to why. Patient denies SI, HI, AVH, and pain at this time. Patient also denies any signs/symptoms of depression/anxiety. Patient has no stated goals for today.  A- Support and encouragement provided.  Routine safety checks conducted every 15 minutes.  Patient informed to notify staff with problems or concerns.  R- Patient contracts for safety at this time. Patient compliant with treatment plan. Patient receptive, calm, and cooperative. Patient interacts well with others on the unit.  Patient remains safe at this time.

## 2018-02-11 NOTE — Progress Notes (Addendum)
Called Dr. Toni Amendlapacs prior to giving metoprolol for high pulse of 131, no response. Pt received 0800 dose of metoprolol 150 mg at 0620 because of a high pulse.

## 2018-02-11 NOTE — Anesthesia Postprocedure Evaluation (Signed)
Anesthesia Post Note  Patient: Jonathan Alexander  Procedure(s) Performed: ECT TX  Patient location during evaluation: PACU Anesthesia Type: General Level of consciousness: awake and alert and oriented Pain management: pain level controlled Vital Signs Assessment: post-procedure vital signs reviewed and stable Respiratory status: spontaneous breathing Cardiovascular status: blood pressure returned to baseline Anesthetic complications: no     Last Vitals:  Vitals:   02/11/18 1317 02/11/18 1318  BP: 94/66 (!) 97/57  Pulse: 100 97  Resp: 18   Temp: 36.7 C   SpO2: 100%     Last Pain:  Vitals:   02/11/18 1317  TempSrc: Oral  PainSc:                  Brannen Koppen

## 2018-02-11 NOTE — Progress Notes (Signed)
Patient states that he doesn't want to have ECT because "it's dangerous". This Clinical research associatewriter notified MD and he stated to this Clinical research associatewriter that patient has a legal guardian and can not make this decision. This Clinical research associatewriter informed patient of what MD said and he then agreed to have the procedure.

## 2018-02-11 NOTE — Anesthesia Procedure Notes (Signed)
Date/Time: 02/11/2018 11:37 AM Performed by: Lily KocherPeralta, Adline Kirshenbaum, CRNA Pre-anesthesia Checklist: Patient identified, Emergency Drugs available, Suction available and Patient being monitored Patient Re-evaluated:Patient Re-evaluated prior to induction Oxygen Delivery Method: Circle system utilized Preoxygenation: Pre-oxygenation with 100% oxygen Induction Type: IV induction Ventilation: Mask ventilation without difficulty and Mask ventilation throughout procedure Airway Equipment and Method: Bite block Placement Confirmation: positive ETCO2 Dental Injury: Teeth and Oropharynx as per pre-operative assessment

## 2018-02-11 NOTE — H&P (Signed)
Per Jonathan Alexander is an 32 y.o. male.   Chief Complaint: Patient is withdrawn and much more paranoid HPI: History of schizoaffective disorder had been getting maintenance ECT but came back to the hospital with worsening psychosis  Past Medical History:  Diagnosis Date  . Schizophrenia (HCC)   . Tachycardia     History reviewed. No pertinent surgical history.  Family History  Family history unknown: Yes   Social History:  reports that he has been smoking cigarettes.  He has smoked for the past 4.00 years. He has never used smokeless tobacco. He reports that he does not drink alcohol or use drugs.  Allergies:  Allergies  Allergen Reactions  . Divalproex Sodium   . Shellfish-Derived Products     Medications Prior to Admission  Medication Sig Dispense Refill  . clonazePAM (KLONOPIN) 1 MG tablet Take 1 mg by mouth at bedtime.    . cloZAPine (CLOZARIL) 100 MG tablet Take 200MG  by mouth every morning and 800MG  by mouth every night at bedtime    . desmopressin (DDAVP) 0.2 MG tablet Take 0.2 mg by mouth 3 (three) times daily.     Marland Kitchen. docusate sodium (COLACE) 100 MG capsule Take 100 mg by mouth at bedtime.     Marland Kitchen. EPINEPHrine (EPIPEN 2-PAK) 0.3 mg/0.3 mL IJ SOAJ injection Inject 0.3 mg into the muscle as directed.    . ferrous sulfate 325 (65 FE) MG tablet Take 325 mg by mouth daily with breakfast.    . imipramine (TOFRANIL) 50 MG tablet Take 175 mg by mouth at bedtime.    Marland Kitchen. lithium carbonate (LITHOBID) 300 MG CR tablet Take 600 mg by mouth at bedtime.    Marland Kitchen. loratadine (CLARITIN) 10 MG tablet Take 10 mg by mouth daily.     . metFORMIN (GLUCOPHAGE) 500 MG tablet Take 500 mg by mouth 2 (two) times daily with a meal.    . metoprolol succinate (TOPROL-XL) 100 MG 24 hr tablet Take 150 mg by mouth daily. Take with or immediately following a meal.    . Multiple Vitamins-Minerals (THEREMS-M) TABS Take 1 tablet by mouth every morning.    Marland Kitchen. OLANZapine zydis (ZYPREXA ZYDIS) 10 MG disintegrating tablet  Dissolve 2 tablets (25MG ) under the tongue every night at bedtime    . perphenazine (TRILAFON) 8 MG tablet Take 16 mg by mouth 2 (two) times daily.    . polyethylene glycol (MIRALAX / GLYCOLAX) packet Take 17 g by mouth every morning.     . senna (SENOKOT) 8.6 MG TABS tablet Take 1 tablet by mouth every morning.     . venlafaxine XR (EFFEXOR-XR) 75 MG 24 hr capsule Take 225 mg by mouth daily with breakfast.    . vitamin C (ASCORBIC ACID) 250 MG tablet Take 250 mg by mouth daily.      Results for orders placed or performed during the hospital encounter of 02/09/18 (from the past 48 hour(s))  Urinalysis, Complete w Microscopic     Status: Abnormal   Collection Time: 02/10/18  9:48 AM  Result Value Ref Range   Color, Urine YELLOW (A) YELLOW   APPearance HAZY (A) CLEAR   Specific Gravity, Urine 1.018 1.005 - 1.030   pH 6.0 5.0 - 8.0   Glucose, UA NEGATIVE NEGATIVE mg/dL   Hgb urine dipstick NEGATIVE NEGATIVE   Bilirubin Urine NEGATIVE NEGATIVE   Ketones, ur NEGATIVE NEGATIVE mg/dL   Protein, ur NEGATIVE NEGATIVE mg/dL   Nitrite NEGATIVE NEGATIVE   Leukocytes, UA NEGATIVE NEGATIVE   RBC /  HPF 0-5 0 - 5 RBC/hpf   WBC, UA 6-10 0 - 5 WBC/hpf   Bacteria, UA NONE SEEN NONE SEEN   Squamous Epithelial / LPF 0-5 0 - 5   Mucus PRESENT    Ca Oxalate Crys, UA PRESENT     Comment: Performed at [facility], [address] [place], [place] [address]  Glucose, capillary     Status: Abnormal   Collection Time: 02/11/18  6:31 AM  Result Value Ref Range   Glucose-Capillary 110 (H) 70 - 99 mg/dL   No results found.  Review of Systems  Constitutional: Negative.   HENT: Negative.   Eyes: Negative.   Respiratory: Negative.   Cardiovascular: Negative.   Gastrointestinal: Negative.   Musculoskeletal: Negative.   Skin: Negative.   Neurological: Negative.   Psychiatric/Behavioral: Positive for depression and hallucinations. Negative for memory loss, substance abuse and suicidal ideas.  The patient is nervous/anxious. The patient does not have insomnia.     Blood pressure 116/76, pulse (!) 103, temperature 98.1 F (36.7 C), temperature source Oral, resp. rate 18, height 5\' 9"  (1.753 m), weight 93.9 kg (207 lb), SpO2 98 %. Physical Exam  Nursing note and vitals reviewed. Constitutional: He appears well-developed and well-nourished.  HENT:  Head: Normocephalic and atraumatic.  Eyes: Pupils are equal, round, and reactive to light. Conjunctivae are normal.  Neck: Normal range of motion.  Cardiovascular: Regular rhythm and normal heart sounds.  Respiratory: Effort normal. No respiratory distress.  GI: Soft.  Musculoskeletal: Normal range of motion.  Neurological: He is alert.  Skin: Skin is warm and dry.  Psychiatric: His affect is blunt. His speech is delayed. He is slowed. Thought content is paranoid. Cognition and memory are impaired. He expresses inappropriate judgment. He expresses no homicidal and no suicidal ideation.     Assessment/Plan Patient will get ECT today and we will tentatively schedule Friday as well  [name], MD 02/11/2018, 11:19 AM

## 2018-02-12 ENCOUNTER — Other Ambulatory Visit: Payer: Self-pay | Admitting: Psychiatry

## 2018-02-12 MED ORDER — ENSURE ENLIVE PO LIQD: 237.0000 mL | ORAL | Status: DC

## 2018-02-12 NOTE — Progress Notes (Signed)
[facility] MD Progress Note  02/12/2018 6:28 PM Goldie Merry  MRN:  [id number] Subjective: Follow-up note 32 year old man with schizoaffective disorder.  Patient has usual is pretty closed mouth although today he did engage in a little more conversation.  He told me that he was feeling better.  When I asked about his paranoid thoughts he still would not share them with me.  He said they were not bothering him as much as previously.  Denied suicidal or homicidal thoughts.  Patient has been neatly dressed and groomed and appropriate in his interactions with no sign of agitation or inappropriate behavior. Principal Problem: Schizoaffective disorder, bipolar type (HCC) Diagnosis:   Patient Active Problem List   Diagnosis Date Noted  . Schizoaffective disorder, bipolar type (HCC) [F25.0] 02/09/2018  . Hypertension [I10] 02/09/2018   Total Time spent with patient: 30 minutes  Past Psychiatric History: Long history of profound psychiatric disorder with long hospitalizations who has stabilized on ECT and medicine  Past Medical History:  Past Medical History:  Diagnosis Date  . Schizophrenia (HCC)   . Tachycardia    History reviewed. No pertinent surgical history. Family History:  Family History  Family history unknown: Yes   Family Psychiatric  History: Denies Social History:  Social History   Substance and Sexual Activity  Alcohol Use No     Social History   Substance and Sexual Activity  Drug Use No    Social History   Socioeconomic History  . Marital status: Single    Spouse name: Not on file  . Number of children: Not on file  . Years of education: Not on file  . Highest education level: Not on file  Occupational History  . Not on file  Social Needs  . Financial resource strain: Not on file  . Food insecurity:    Worry: Not on file    Inability: Not on file  . Transportation needs:    Medical: Not on file    Non-medical: Not on file  Tobacco Use  . Smoking status:  Current Every Day Smoker    Years: 4.00    Types: Cigarettes  . Smokeless tobacco: Never Used  Substance and Sexual Activity  . Alcohol use: No  . Drug use: No  . Sexual activity: Never  Lifestyle  . Physical activity:    Days per week: Not on file    Minutes per session: Not on file  . Stress: Not on file  Relationships  . Social connections:    Talks on phone: Not on file    Gets together: Not on file    Attends religious service: Not on file    Active member of club or organization: Not on file    Attends meetings of clubs or organizations: Not on file    Relationship status: Not on file  Other Topics Concern  . Not on file  Social History Narrative  . Not on file   Additional Social History:                         Sleep: Fair  Appetite:  Fair  Current Medications: Current Facility-Administered Medications  Medication Dose Route Frequency Provider Last Rate Last Dose  . acetaminophen (TYLENOL) tablet 650 mg  650 mg Oral Q6H PRN Toma Erichsen T, MD      . alum & mag hydroxide-simeth (MAALOX/MYLANTA) 200-200-20 MG/5ML suspension 30 mL  30 mL Oral Q4H PRN Dynisha Due, Jackquline Denmark, MD      .  clonazePAM (KLONOPIN) tablet 1 mg  1 mg Oral QHS Tyreon Frigon, Jackquline Denmark, MD   1 mg at 02/11/18 2116  . cloZAPine (CLOZARIL) tablet 800 mg  800 mg Oral QHS Koi Zangara T, MD   800 mg at 02/11/18 2117  . desmopressin (DDAVP) tablet 0.2 mg  0.2 mg Oral QHS Waylan Busta,  T, MD   0.2 mg at 02/11/18 2126  . docusate sodium (COLACE) capsule 100 mg  100 mg Oral BID Tredarius Cobern, Jackquline Denmark, MD   100 mg at 02/12/18 1756  . feeding supplement (ENSURE ENLIVE) (ENSURE ENLIVE) liquid 237 mL  237 mL Oral NOW Latrece Nitta T, MD      . ferrous sulfate tablet 325 mg  325 mg Oral Q breakfast Brilyn Tuller, Jackquline Denmark, MD   325 mg at 02/12/18 0858  . glycopyrrolate (ROBINUL) injection 0.2 mg  0.2 mg Intravenous Once Ila Landowski T, MD      . hydrOXYzine (ATARAX/VISTARIL) tablet 50 mg  50 mg Oral TID PRN Jennelle Pinkstaff,  T, MD       . imipramine (TOFRANIL) tablet 175 mg  175 mg Oral QHS Kinzy Weyers, Jackquline Denmark, MD   175 mg at 02/11/18 2149  . lithium carbonate capsule 600 mg  600 mg Oral QHS Marnette Perkins, Jackquline Denmark, MD   600 mg at 02/11/18 2116  . loratadine (CLARITIN) tablet 10 mg  10 mg Oral Daily Sherylann Vangorden, Jackquline Denmark, MD   10 mg at 02/10/18 0917  . magnesium hydroxide (MILK OF MAGNESIA) suspension 30 mL  30 mL Oral Daily PRN Keelen Quevedo T, MD      . metFORMIN (GLUCOPHAGE) tablet 500 mg  500 mg Oral BID WC Deona Novitski, Jackquline Denmark, MD   500 mg at 02/12/18 1756  . metoprolol succinate (TOPROL-XL) 24 hr tablet 150 mg  150 mg Oral Daily Shaquira Moroz, Jackquline Denmark, MD   150 mg at 02/12/18 0907  . OLANZapine (ZYPREXA) tablet 10 mg  10 mg Oral Daily Tiberius Loftus, Jackquline Denmark, MD   10 mg at 02/12/18 0858  . OLANZapine (ZYPREXA) tablet 15 mg  15 mg Oral QHS Damiano Stamper, Jackquline Denmark, MD   15 mg at 02/11/18 2115  . polyethylene glycol (MIRALAX / GLYCOLAX) packet 17 g  17 g Oral Daily Joanie Duprey T, MD      . senna (SENOKOT) tablet 17.2 mg  2 tablet Oral QHS Tajana Crotteau T, MD   17.2 mg at 02/11/18 2125  . traZODone (DESYREL) tablet 100 mg  100 mg Oral QHS PRN , Jackquline Denmark, MD   100 mg at 02/10/18 2151  . venlafaxine (EFFEXOR) tablet 75 mg  75 mg Oral BID , Jackquline Denmark, MD   75 mg at 02/12/18 1756  . vitamin C (ASCORBIC ACID) tablet 250 mg  250 mg Oral Daily Lyvia Mondesir T, MD   250 mg at 02/12/18 0900    Lab Results:  Results for orders placed or performed during the hospital encounter of 02/09/18 (from the past 48 hour(s))  Glucose, capillary     Status: Abnormal   Collection Time: 02/11/18  6:31 AM  Result Value Ref Range   Glucose-Capillary 110 (H) 70 - 99 mg/dL    Blood Alcohol level:  Lab Results  Component Value Date   ETH <10 02/09/2018    Metabolic Disorder Labs: Lab Results  Component Value Date   HGBA1C 5.0 02/09/2018   MPG 97 02/09/2018   No results found for: PROLACTIN Lab Results  Component Value Date   CHOL 167 02/09/2018  TRIG 169 (H)  02/09/2018   HDL 38 (L) 02/09/2018   CHOLHDL 4.4 02/09/2018   VLDL 34 02/09/2018   LDLCALC 95 02/09/2018    Physical Findings: AIMS:  , ,  ,  ,    CIWA:    COWS:     Musculoskeletal: Strength & Muscle Tone: within normal limits Gait & Station: normal Patient leans: N/A  Psychiatric Specialty Exam: Physical Exam  Nursing note and vitals reviewed. Constitutional: He appears well-developed and well-nourished.  HENT:  Head: Normocephalic and atraumatic.  Eyes: Pupils are equal, round, and reactive to light. Conjunctivae are normal.  Neck: Normal range of motion.  Cardiovascular: Regular rhythm and normal heart sounds.  Respiratory: Effort normal. No respiratory distress.  GI: Soft.  Musculoskeletal: Normal range of motion.  Neurological: He is alert.  Skin: Skin is warm and dry.  Psychiatric: Judgment normal. His affect is blunt. His speech is delayed. He is slowed. Cognition and memory are impaired. He expresses no homicidal and no suicidal ideation.    Review of Systems  Constitutional: Negative.   HENT: Negative.   Eyes: Negative.   Respiratory: Negative.   Cardiovascular: Negative.   Gastrointestinal: Negative.   Musculoskeletal: Negative.   Skin: Negative.   Neurological: Negative.   Psychiatric/Behavioral: Negative.     Blood pressure 113/75, pulse (!) 130, temperature 98.2 F (36.8 C), temperature source Oral, resp. rate 18, height 5\' 9"  (1.753 m), weight 93.9 kg (207 lb), SpO2 100 %.Body mass index is 30.57 kg/m.  General Appearance: Fairly Groomed  Eye Contact:  Good  Speech:  Clear and Coherent  Volume:  Normal  Mood:  Euthymic  Affect:  Constricted  Thought Process:  Goal Directed  Orientation:  Full (Time, Place, and Person)  Thought Content:  Logical  Suicidal Thoughts:  No  Homicidal Thoughts:  No  Memory:  Immediate;   Fair Recent;   Fair Remote;   Fair  Judgement:  Fair  Insight:  Fair  Psychomotor Activity:  Decreased  Concentration:   Concentration: Fair  Recall:  [employer]Fair  Fund of Knowledge:  Fair  Language:  Fair  Akathisia:  No  Handed:  Right  AIMS (if indicated):     Assets:  Desire for Improvement Housing Physical Health  ADL's:  Intact  Cognition:  Impaired,  Mild  Sleep:  Number of Hours: 7.45     Treatment Plan Summary: Daily contact with patient to assess and evaluate symptoms and progress in treatment, Medication management and Plan Patient appears to probably be returning to his baseline.  He is subjectively less nervous and agitated.  No sign of acute dangerousness.  I suspect we can probably safely discharge him tomorrow evening after ECT treatment.  I propose this to the patient that he think about it.  We will follow up tomorrow  Mordecai RasmussenJohn Stormy Connon, MD 02/12/2018, 6:28 PM

## 2018-02-12 NOTE — Progress Notes (Signed)
Recreation Therapy Notes  Date: 02/12/2018  Time: 3:00pm  Location: Craft room  Behavioral response: N/A  Group Type: Game  Participation level: N/A  Communication: Patient did not attend group.  Comments: N/A  Yarimar Lavis LRT/CTRS        Avory Rahimi 02/12/2018 3:38 PM

## 2018-02-12 NOTE — Plan of Care (Signed)
Patient verbalizes understanding of the general information that's been provided to him and all questions/concerns have been addressed and answered. Patient denies SI/HI/AVH as well as any signs/symptoms of anxiety. Patient rates his depression a "2/10" stating to this writer "everybody has that" and proceeds to state to this Clinical research associatewriter "I care too much". Patient has been isolative to his room except for meal times and to make phone calls. Patient has been sleeping throughout the day, however, he did come out into the milieu for a while before dinner. Patient has the ability to identify the available resources that can assist him in meeting his health-related needs. Patient verbalizes understanding of and has been in compliance with his prescribed therapeutic regimen and has been working towards maintaining his clinical measurements within normal limits. Patient has been free from injury thus far and remains safe on the unit at this time.  Problem: Education: Goal: Knowledge of [facility] Education information/materials will improve Outcome: Progressing Goal: Emotional status will improve Outcome: Progressing Goal: Mental status will improve Outcome: Progressing Goal: Verbalization of understanding the information provided will improve Outcome: Progressing   Problem: Activity: Goal: Interest or engagement in activities will improve Outcome: Progressing Goal: Sleeping patterns will improve Outcome: Progressing   Problem: Coping: Goal: Ability to verbalize frustrations and anger appropriately will improve Outcome: Progressing Goal: Ability to demonstrate self-control will improve Outcome: Progressing   Problem: Health Behavior/Discharge Planning: Goal: Identification of resources available to assist in meeting health care needs will improve Outcome: Progressing Goal: Compliance with treatment plan for underlying cause of condition will improve Outcome: Progressing   Problem: Physical  Regulation: Goal: Ability to maintain clinical measurements within normal limits will improve Outcome: Progressing   Problem: Safety: Goal: Periods of time without injury will increase Outcome: Progressing   Problem: Education: Goal: Will be free of psychotic symptoms Outcome: Progressing Goal: Knowledge of the prescribed therapeutic regimen will improve Outcome: Progressing   Problem: Health Behavior/Discharge Planning: Goal: Compliance with prescribed medication regimen will improve Outcome: Progressing   Problem: Health Behavior/Discharge Planning: Goal: Identification of resources available to assist in meeting health care needs will improve Outcome: Progressing   Problem: Safety: Goal: Ability to remain free from injury will improve Outcome: Progressing

## 2018-02-12 NOTE — Progress Notes (Signed)
D- Patient alert and oriented. Patient presents in a depressed, but, pleasant mood on assessment stating that he slept "good" last night and overall feels "pretty good". Patient does endorse some depression stating that "everybody has that, I care too much" and rates his depression a "2/10". Patient denies SI, HI, AVH, and pain at this time. Patient also denies any anxiety Patient's goal for today is to "call my mom".  A- Scheduled medications administered to patient, per MD orders. Support and encouragement provided.  Routine safety checks conducted every 15 minutes.  Patient informed to notify staff with problems or concerns.  R- No adverse drug reactions noted. Patient contracts for safety at this time. Patient compliant with medications and treatment plan. Patient receptive, calm, and cooperative. Patient interacts well with others on the unit.  Patient remains safe at this time.

## 2018-02-12 NOTE — BHH Group Notes (Signed)
LCSW Group Therapy Note 02/12/2018 9:00 AM  Type of Therapy and Topic:  Group Therapy:  Setting Goals  Participation Level:  Did Not Attend  Description of Group: In this process group, patients discussed using strengths to work toward goals and address challenges.  Patients identified two positive things about themselves and one goal they were working on.  Patients were given the opportunity to share openly and support each other's plan for self-empowerment.  The group discussed the value of gratitude and were encouraged to have a daily reflection of positive characteristics or circumstances.  Patients were encouraged to identify a plan to utilize their strengths to work on current challenges and goals.  Therapeutic Goals 1. Patient will verbalize personal strengths/positive qualities and relate how these can assist with achieving desired personal goals 2. Patients will verbalize affirmation of peers plans for personal change and goal setting 3. Patients will explore the value of gratitude and positive focus as related to successful achievement of goals 4. Patients will verbalize a plan for regular reinforcement of personal positive qualities and circumstances.  Summary of Patient Progress: Jonathan Alexander was invited to today's group, but chose not to attend.      Therapeutic Modalities Cognitive Behavioral Therapy Motivational Interviewing    Jonathan FrameSonya S Dorothy Alexander, [place] 02/12/2018 2:15 PM

## 2018-02-12 NOTE — Plan of Care (Signed)
Quiet, polite, disoriented to place and time, re-oriented to time, date, month and place; patient has thought blocking or cognitive delayed response; "I think I came here because I was confused..."; medication regimen observed to be a lot (polypharmacy). No side effects reported post ECT.  Patient slept for Estimated Hours of 7.45; Precautionary checks every 15 minutes for safety maintained, room free of safety hazards, patient sustains no injury or falls during this shift.  Problem: Education: Goal: Emotional status will improve Outcome: Progressing   Problem: Activity: Goal: Interest or engagement in activities will improve Outcome: Progressing Goal: Sleeping patterns will improve Outcome: Progressing   Problem: Safety: Goal: Periods of time without injury will increase Outcome: Progressing   Problem: Education: Goal: Will be free of psychotic symptoms Outcome: Progressing   Problem: Safety: Goal: Ability to remain free from injury will improve Outcome: Progressing

## 2018-02-12 NOTE — BHH Group Notes (Signed)
  02/12/2018  Time: 1PM  Type of Therapy/Topic:  Group Therapy:  Balance in Life  Participation Level:  Did Not Attend  Description of Group:   This group will address the concept of balance and how it feels and looks when one is unbalanced. Patients will be encouraged to process areas in their lives that are out of balance and identify reasons for remaining unbalanced. Facilitators will guide patients in utilizing problem-solving interventions to address and correct the stressor making their life unbalanced. Understanding and applying boundaries will be explored and addressed for obtaining and maintaining a balanced life. Patients will be encouraged to explore ways to assertively make their unbalanced needs known to significant others in their lives, using other group members and facilitator for support and feedback.  Therapeutic Goals: 1. Patient will identify two or more emotions or situations they have that consume much of in their lives. 2. Patient will identify signs/triggers that life has become out of balance:  3. Patient will identify two ways to set boundaries in order to achieve balance in their lives:  4. Patient will demonstrate ability to communicate their needs through discussion and/or role plays  Summary of Patient Progress: Pt was invited to attend group but chose not to attend. CSW will continue to encourage pt to attend group throughout their admission.   Therapeutic Modalities:   Cognitive Behavioral Therapy Solution-Focused Therapy Assertiveness Training  Heidi DachKelsey Eutha Cude, MSW, LCSW Clinical Social Worker 02/12/2018 2:49 PM

## 2018-02-12 NOTE — Progress Notes (Signed)
Recreation Therapy Notes   Date: 02/12/2018  Time: 9:30 am   Location: Craft Room   Behavioral response: N/A   Intervention Topic:  Anger Management   Discussion/Intervention: Patient did not attend group.   Clinical Observations/Feedback:  Patient did not attend group.   Latha Staunton LRT/CTRS        Nichollas Perusse 02/12/2018 10:42 AM

## 2018-02-12 NOTE — Progress Notes (Signed)
This Clinical research associatewriter did not administer patient's scheduled Claritin because according to pharmacy, it is on back order for the hospital.

## 2018-02-13 ENCOUNTER — Inpatient Hospital Stay: Payer: Medicare Other | Admitting: Registered Nurse

## 2018-02-13 ENCOUNTER — Inpatient Hospital Stay: Payer: Medicare Other

## 2018-02-13 ENCOUNTER — Encounter: Payer: Self-pay | Admitting: Anesthesiology

## 2018-02-13 DIAGNOSIS — F250 Schizoaffective disorder, bipolar type: Secondary | ICD-10-CM | POA: Diagnosis not present

## 2018-02-13 DIAGNOSIS — I10 Essential (primary) hypertension: Secondary | ICD-10-CM | POA: Diagnosis not present

## 2018-02-13 DIAGNOSIS — E669 Obesity, unspecified: Secondary | ICD-10-CM | POA: Diagnosis not present

## 2018-02-13 DIAGNOSIS — Z6830 Body mass index (BMI) 30.0-30.9, adult: Secondary | ICD-10-CM | POA: Diagnosis not present

## 2018-02-13 LAB — GLUCOSE, CAPILLARY: GLUCOSE-CAPILLARY: 98 mg/dL (ref 70–99)

## 2018-02-13 MED ORDER — MIDAZOLAM HCL 2 MG/2ML IJ SOLN: 1.0000 mg | Freq: Once | INTRAMUSCULAR | Status: DC

## 2018-02-13 MED ORDER — VENLAFAXINE HCL ER 75 MG PO CP24: 225.0000 mg | ORAL_CAPSULE | Freq: Every day | ORAL | 0 refills | Status: DC

## 2018-02-13 MED ORDER — CLOZAPINE 200 MG PO TABS: 1000.0000 mg | ORAL_TABLET | Freq: Every day | ORAL | 0 refills | Status: DC

## 2018-02-13 MED ORDER — LORATADINE 10 MG PO TABS: 10.0000 mg | ORAL_TABLET | Freq: Every day | ORAL | 0 refills | Status: DC

## 2018-02-13 MED ORDER — METOPROLOL SUCCINATE ER 50 MG PO TB24: 150.0000 mg | ORAL_TABLET | Freq: Every day | ORAL | 0 refills | Status: DC

## 2018-02-13 MED ORDER — FENTANYL CITRATE (PF) 100 MCG/2ML IJ SOLN: 25.0000 ug | INTRAMUSCULAR | Status: DC | PRN

## 2018-02-13 MED ORDER — MIDAZOLAM HCL 2 MG/2ML IJ SOLN
INTRAMUSCULAR | Status: DC | PRN
  Administered 2018-02-13: 1 mg via INTRAVENOUS

## 2018-02-13 MED ORDER — CLONAZEPAM 1 MG PO TABS: 1.0000 mg | ORAL_TABLET | Freq: Every day | ORAL | 0 refills | Status: DC

## 2018-02-13 MED ORDER — METFORMIN HCL 500 MG PO TABS: 500.0000 mg | ORAL_TABLET | Freq: Two times a day (BID) | ORAL | 0 refills | Status: DC

## 2018-02-13 MED ORDER — SODIUM CHLORIDE 0.9 % IV SOLN
500.0000 mL | Freq: Once | INTRAVENOUS | Status: AC
  Administered 2018-02-13: 500 mL via INTRAVENOUS

## 2018-02-13 MED ORDER — OLANZAPINE 15 MG PO TABS: 15.0000 mg | ORAL_TABLET | Freq: Every day | ORAL | 0 refills | Status: DC

## 2018-02-13 MED ORDER — TRAZODONE HCL 100 MG PO TABS: 100.0000 mg | ORAL_TABLET | Freq: Every evening | ORAL | 0 refills | Status: DC | PRN

## 2018-02-13 MED ORDER — GLYCOPYRROLATE 0.2 MG/ML IJ SOLN: 0.2000 mg | Freq: Once | INTRAMUSCULAR | Status: DC

## 2018-02-13 MED ORDER — DOCUSATE SODIUM 100 MG PO CAPS: 100.0000 mg | ORAL_CAPSULE | Freq: Two times a day (BID) | ORAL | 0 refills | Status: DC

## 2018-02-13 MED ORDER — MIDAZOLAM HCL 2 MG/2ML IJ SOLN
INTRAMUSCULAR | Status: AC
  Filled 2018-02-13: qty 2

## 2018-02-13 MED ORDER — SODIUM CHLORIDE 0.9 % IV SOLN
INTRAVENOUS | Status: DC | PRN
Start: 2018-02-13 — End: 2018-02-13
  Administered 2018-02-13: 11:00:00 via INTRAVENOUS

## 2018-02-13 MED ORDER — SUCCINYLCHOLINE CHLORIDE 20 MG/ML IJ SOLN
INTRAMUSCULAR | Status: DC | PRN
  Administered 2018-02-13: 100 mg via INTRAVENOUS

## 2018-02-13 MED ORDER — METHOHEXITAL SODIUM 100 MG/10ML IV SOSY
PREFILLED_SYRINGE | INTRAVENOUS | Status: DC | PRN
  Administered 2018-02-13: 70 mg via INTRAVENOUS

## 2018-02-13 MED ORDER — DESMOPRESSIN ACETATE 0.2 MG PO TABS: 0.2000 mg | ORAL_TABLET | Freq: Every day | ORAL | 0 refills | Status: DC

## 2018-02-13 MED ORDER — ONDANSETRON HCL 4 MG/2ML IJ SOLN: 4.0000 mg | Freq: Once | INTRAMUSCULAR | Status: DC | PRN

## 2018-02-13 MED ORDER — POLYETHYLENE GLYCOL 3350 17 G PO PACK: 17.0000 g | PACK | ORAL | 0 refills | Status: DC

## 2018-02-13 MED ORDER — FERROUS SULFATE 325 (65 FE) MG PO TABS: 325.0000 mg | ORAL_TABLET | Freq: Every day | ORAL | 0 refills | Status: DC

## 2018-02-13 MED ORDER — LITHIUM CARBONATE ER 300 MG PO TBCR: 600.0000 mg | EXTENDED_RELEASE_TABLET | Freq: Every day | ORAL | 0 refills | Status: DC

## 2018-02-13 MED ORDER — SENNA 8.6 MG PO TABS: 1.0000 | ORAL_TABLET | ORAL | 0 refills | Status: DC

## 2018-02-13 MED ORDER — IMIPRAMINE HCL 50 MG PO TABS: 150.0000 mg | ORAL_TABLET | Freq: Every day | ORAL | 0 refills | Status: DC

## 2018-02-13 NOTE — Anesthesia Postprocedure Evaluation (Signed)
Anesthesia Post Note  Patient: Jonathan Alexander  Procedure(s) Performed: ECT TX  Patient location during evaluation: PACU Anesthesia Type: General Level of consciousness: awake and alert and oriented Pain management: pain level controlled Vital Signs Assessment: post-procedure vital signs reviewed and stable Respiratory status: spontaneous breathing Cardiovascular status: blood pressure returned to baseline Anesthetic complications: no     Last Vitals:  Vitals:   02/13/18 1303 02/13/18 1306  BP: (!) 106/59 110/68  Pulse: (!) 110   Resp:    Temp: 36.9 C   SpO2:      Last Pain:  Vitals:   02/13/18 1303  TempSrc: Oral  PainSc:                  Elanda Garmany

## 2018-02-13 NOTE — Progress Notes (Signed)
Recreation Therapy Notes  Date: 02/13/2018  Time: 9:30 am   Location: Craft Room   Behavioral response: N/A   Intervention Topic:  Team Work  Discussion/Intervention: Patient did not attend group.   Clinical Observations/Feedback:  Patient did not attend group.   [name] LRT/CTRS        Jonathan Alexander 02/13/2018 10:24 AM 
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
## 2018-02-13 NOTE — Plan of Care (Signed)
Patient verbalizes understanding of the general information that's been provided to him. Patient denies SI/HI/AVH as well as anxiety.  Patient had ECT today and tolerated procedure well. Patient has been isolative to his room except for meals, medications and to use the phone.Patient has the ability to identify the available resources that can assist him in meeting his health-related needs.Patient has been free from injury thus far and remains safe on the unit at this time.

## 2018-02-13 NOTE — Plan of Care (Signed)
Improved in Mood and Affect, he is more alert and oriented today, denied any pain, denied SI/HI/AVH. Noticed to paced the floor prior to bedtime around 2300 hours, "I am okay, just exercising..."   Patient slept for Estimated Hours of 6; Precautionary checks every 15 minutes for safety maintained, room free of safety hazards, patient sustains no injury or falls during this shift.  Problem: Education: Goal: Emotional status will improve Outcome: Progressing Goal: Mental status will improve Outcome: Progressing Goal: Verbalization of understanding the information provided will improve Outcome: Progressing   Problem: Activity: Goal: Sleeping patterns will improve Outcome: Progressing   Problem: Coping: Goal: Ability to demonstrate self-control will improve Outcome: Progressing   Problem: Safety: Goal: Periods of time without injury will increase Outcome: Progressing   Problem: Education: Goal: Will be free of psychotic symptoms Outcome: Progressing

## 2018-02-13 NOTE — Progress Notes (Signed)
  [facility] Dis[facility] :  Will you be returning to the same living situation after discharge:  Yes,  Back to his group home At discharge, do you have transportation home?: Yes,  Group home will come at discharge Do you have the ability to pay for your medications: Yes,  Medicare  Release of information consent forms completed and in the chart;  Patient's signature needed at discharge.  Patient to Follow up at: Follow-up Information    [facility]. Go on 02/18/2018.   Why:  The ECT Nurses will call you and schedule an appointment with you for next week. Thank you. Contact information: [address] [id number] [place] [id number] [phone]          Next level of care provider [facility] Link:no  Safety Planning and Suicide Prevention discussed: Yes,  [name], [employer] Guardian,  Have you used any form of tobacco in the last 30 days? (Cigarettes, Smokeless Tobacco, Cigars, and/or Pipes): Yes  Has patient been referred to the Quitline?: Patient refused referral  Patient has been referred for addiction treatment: N/A  Johny ShearsCassandra  [place] Kirchner, LCSW 02/13/2018, 9:02 AM

## 2018-02-13 NOTE — Transfer of Care (Signed)
Immediate Anesthesia Transfer of Care Note  Patient: Jonathan Alexander  Procedure(s) Performed: ECT TX  Patient Location: PACU  Anesthesia Type:General  Level of Consciousness: sedated  Airway & Oxygen Therapy: Patient Spontanous Breathing and Patient connected to face mask oxygen  Post-op Assessment: Report given to RN and Post -op Vital signs reviewed and stable  Post vital signs: Reviewed and stable  Last Vitals:  Vitals Value Taken Time  BP 131/91 02/13/2018 11:12 AM  Temp 36.7 C 02/13/2018 11:12 AM  Pulse 95 02/13/2018 11:12 AM  Resp 15 02/13/2018 11:12 AM  SpO2 100 % 02/13/2018 11:12 AM    Last Pain:  Vitals:   02/13/18 1039  TempSrc: Oral  PainSc:          Complications: No apparent anesthesia complications

## 2018-02-13 NOTE — BHH Suicide Risk Assessment (Signed)
[facility]   Principal Problem: Schizoaffective disorder, bipolar type [facility](HCC) Discharge Diagnoses:  Patient Active Problem List   Diagnosis Date Noted  . Schizoaffective disorder, bipolar type (HCC) [F25.0] 02/09/2018  . Hypertension [I10] 02/09/2018    Total Time spent with patient: 45 minutes  Musculoskeletal: Strength & Muscle Tone: within normal limits Gait & Station: normal Patient leans: N/A  Psychiatric Specialty Exam: Review of Systems  Constitutional: Negative.   HENT: Negative.   Eyes: Negative.   Respiratory: Negative.   Cardiovascular: Negative.   Gastrointestinal: Negative.   Musculoskeletal: Negative.   Skin: Negative.   Neurological: Negative.   Psychiatric/Behavioral: Positive for memory loss. Negative for depression, hallucinations, substance abuse and suicidal ideas. The patient is not nervous/anxious and does not have insomnia.     Blood pressure 117/79, pulse (!) 109, temperature 98.1 F (36.7 C), resp. rate 14, height 5\' 9"  (1.753 m), weight 93.9 kg (207 lb), SpO2 96 %.Body mass index is 30.57 kg/m.  General Appearance: Fairly Groomed  Patent attorneyye Contact::  Fair  Speech:  [phone]  Volume:  Decreased  Mood:  Euthymic  Affect:  Flat  Thought Process:  Coherent  Orientation:  Full (Time, Place, and Person)  Thought Content:  Rumination  Suicidal Thoughts:  No  Homicidal Thoughts:  No  Memory:  Immediate;   Fair Recent;   Fair Remote;   Fair  Judgement:  Fair  Insight:  Fair  Psychomotor Activity:  Decreased  Concentration:  Fair  Recall:  [employer]  Fund of Knowledge:Fair  Language: Fair  Akathisia:  No  Handed:  Right  AIMS (if indicated):     Assets:  Housing Physical Health Resilience Social Support  Sleep:  Number of Hours: 6  Cognition: Impaired,  Mild  ADL's:  Intact   Mental Status Per Nursing Assessment::   On Admission:  NA  Demographic Factors:  Male  Loss Factors: Decrease in vocational  status  Historical Factors: Impulsivity  Risk Reduction Factors:   Living with another person, especially a relative, Positive social support and Positive therapeutic relationship  Continued Clinical Symptoms:  Schizophrenia:   Paranoid or undifferentiated type  Cognitive Features That Contribute To Risk:  Loss of executive function    Suicide Risk:  Minimal: No identifiable suicidal ideation.  Patients presenting with no risk factors but with morbid ruminations; may be classified as minimal risk based on the severity of the depressive symptoms  Follow-up Information    [facility]. Go on 02/18/2018.   Why:  The ECT Nurses will call you and schedule an appointment with you for next week. Thank you. Contact information: [address] [id number] [place] [phone]       Services, Psychotherapeutic. Go on 02/14/2018.   Why:  Your proivders at [facility] will follow up with you Saturday 02/15/2018 between 11AM-1PM. Thank you. Contact information: [address] [place] [id number] [phone]           Plan Of Care/Follow-up recommendations:  Activity:  as tolerated Diet:  heart healthy Other:  next ECT 7/24  [name], MD 02/13/2018, 11:41 AM

## 2018-02-13 NOTE — Anesthesia Preprocedure Evaluation (Signed)
Anesthesia Evaluation  Patient identified by MRN, date of birth, ID band Patient awake    Reviewed: Allergy & Precautions, NPO status , Patient's Chart, lab work & pertinent test results, reviewed documented beta blocker date and time   History of Anesthesia Complications Negative for: history of anesthetic complications  Airway Mallampati: II  TM Distance: >3 FB     Dental  (+) Chipped   Pulmonary neg sleep apnea, neg COPD, Current Smoker, former smoker,    breath sounds clear to auscultation- rhonchi (-) wheezing      Cardiovascular Exercise Tolerance: Good hypertension, (-) CAD, (-) Past MI and (-) Cardiac Stents  Rhythm:Regular Rate:Normal - Systolic murmurs and - Diastolic murmurs    Neuro/Psych PSYCHIATRIC DISORDERS Schizophrenia negative neurological ROS     GI/Hepatic negative GI ROS, Neg liver ROS,   Endo/Other  negative endocrine ROSneg diabetes  Renal/GU negative Renal ROS     Musculoskeletal negative musculoskeletal ROS (+)   Abdominal (+) + obese,   Peds  Hematology negative hematology ROS (+)   Anesthesia Other Findings Past Medical History: No date: Schizophrenia (HCC) No date: Tachycardia   Reproductive/Obstetrics                             Anesthesia Physical  Anesthesia Plan  ASA: III  Anesthesia Plan: General   Post-op Pain Management:    Induction: Intravenous  PONV Risk Score and Plan: 2 and Ondansetron  Airway Management Planned: Mask  Additional Equipment:   Intra-op Plan:   Post-operative Plan:   Informed Consent: I have reviewed the patients History and Physical, chart, labs and discussed the procedure including the risks, benefits and alternatives for the proposed anesthesia with the patient or authorized representative who has indicated his/her understanding and acceptance.     Plan Discussed with: CRNA and Anesthesiologist  Anesthesia  Plan Comments:         Anesthesia Quick Evaluation

## 2018-02-13 NOTE — Procedures (Signed)
ECT SERVICES Physician's Interval Evaluation & Treatment Note  Patient Identification: Jonathan FriendlyDemetrio Alexander MRN:  [id number] Date of Evaluation:  02/13/2018 TX #: [id number]  MADRS:   MMSE:   P.E. Findings:  No change to physical exam  Psychiatric Interval Note:  Still flat and withdrawn which is pretty much his baseline.  No sign of agitation or threats.  Denies feeling frightened or paranoid  Subjective:  Patient is a 32 y.o. male seen for evaluation for Electroconvulsive Therapy. Feeling better it seems  Treatment Summary:   []   Right Unilateral             [x]  Bilateral   % Energy : 1.0 ms 100%   Impedance: 930 ohms  Seizure Energy Index: 19,490 V squared  Postictal Suppression Index: 94%  Seizure Concordance Index: 98%  Medications  Pre Shock: Robinul 0.2 mg Brevital 70 mg succinylcholine 100 mg  Post Shock: Versed 1 mg  Seizure Duration: 27 seconds EMG 35 seconds EEG   Comments: I am that is suggested follow-up in about a week and a half  Lungs:  [x]   Clear to auscultation               []  Other:   Heart:    [x]   Regular rhythm             []  irregular rhythm    [x]   Previous H&P reviewed, patient examined and there are NO CHANGES                 []   Previous H&P reviewed, patient examined and there are changes noted.   Mordecai RasmussenJohn Daijanae Rafalski, MD 7/12/201910:59 AM

## 2018-02-13 NOTE — Anesthesia Post-op Follow-up Note (Signed)
Anesthesia QCDR form completed.        

## 2018-02-13 NOTE — BHH Group Notes (Signed)
02/13/2018 1PM  Type of Therapy and Topic:  Group Therapy:  Feelings around Relapse and Recovery  Participation Level:  Did Not Attend   Description of Group:    Patients in this group will discuss emotions they experience before and after a relapse. They will process how experiencing these feelings, or avoidance of experiencing them, relates to having a relapse. Facilitator will guide patients to explore emotions they have related to recovery. Patients will be encouraged to process which emotions are more powerful. They will be guided to discuss the emotional reaction significant others in their lives may have to patients' relapse or recovery. Patients will be assisted in exploring ways to respond to the emotions of others without this contributing to a relapse.  Therapeutic Goals: 1. Patient will identify two or more emotions that lead to a relapse for them 2. Patient will identify two emotions that result when they relapse 3. Patient will identify two emotions related to recovery 4. Patient will demonstrate ability to communicate their needs through discussion and/or role plays   Summary of Patient Progress: Patient was encouraged and invited to attend group. Patient did not attend group. Social worker will continue to encourage group participation in the future.     Therapeutic Modalities:   Cognitive Behavioral Therapy Solution-Focused Therapy Assertiveness Training Relapse Prevention Therapy   Johny ShearsCassandra  Radin Raptis, LCSW 02/13/2018 1:57 PM

## 2018-02-13 NOTE — [place] FL2 (Signed)
Jonathan Alexander MEDICAID FL2 LEVEL OF CARE SCREENING TOOL     IDENTIFICATION  Patient Name: Jonathan FriendlyDemetrio Choinski Birthdate: 03/20/1986 Sex: male Admission Date (Current Location): 02/09/2018  [place]:  Jonathan Alexander [id number] [facility] Facility and Address:  [facility], [address], [place], [place] [address]      Provider Number: [id number]  Attending Physician Name and Address:  Audery Amellapacs, John T, MD  Relative Name and Phone Number:  McKnight,Vince Legal Guardian [phone]     Current Level of Care: Hospital Recommended Level of Care: Family Care Home Prior Approval Number:    Date Approved/Denied:   PASRR Number:    Discharge Plan: Domiciliary (Rest home)    Current Diagnoses: Patient Active Problem List   Diagnosis Date Noted  . Schizoaffective disorder, bipolar type (HCC) 02/09/2018  . Hypertension 02/09/2018    Orientation RESPIRATION BLADDER Height & Weight     Self, Time, Situation, Place  Normal Continent Weight: 207 lb (93.9 kg) Height:  5\' 9"  (175.3 cm)  BEHAVIORAL SYMPTOMS/MOOD NEUROLOGICAL BOWEL NUTRITION STATUS  (None) (None) Continent Diet(Normal)  AMBULATORY STATUS COMMUNICATION OF NEEDS Skin   Independent Verbally Normal                       Personal Care Assistance Level of Assistance  Bathing, Feeding, Dressing, Total care Bathing Assistance: Independent Feeding assistance: Independent Dressing Assistance: Independent Total Care Assistance: Independent   Functional Limitations Info  Sight, Hearing, Speech Sight Info: Adequate Hearing Info: Adequate Speech Info: Adequate    SPECIAL CARE FACTORS FREQUENCY  (Psycotropic Medications)                    Contractures Contractures Info: Not present    Additional Factors Info  Psychotropic     Psychotropic Info: Psycotropic Medications         Current Medications (02/13/2018):  This is the current hospital active medication list Current  Facility-Administered Medications  Medication Dose Route Frequency Provider Last Rate Last Dose  . acetaminophen (TYLENOL) tablet 650 mg  650 mg Oral Q6H PRN Clapacs, John T, MD      . alum & mag hydroxide-simeth (MAALOX/MYLANTA) 200-200-20 MG/5ML suspension 30 mL  30 mL Oral Q4H PRN Clapacs, John T, MD      . clonazePAM (KLONOPIN) tablet 1 mg  1 mg Oral QHS Clapacs, Jackquline DenmarkJohn T, MD   1 mg at 02/12/18 2108  . cloZAPine (CLOZARIL) tablet 800 mg  800 mg Oral QHS Clapacs, John T, MD   800 mg at 02/12/18 2108  . desmopressin (DDAVP) tablet 0.2 mg  0.2 mg Oral QHS Clapacs, John T, MD   0.2 mg at 02/12/18 2110  . docusate sodium (COLACE) capsule 100 mg  100 mg Oral BID Clapacs, Jackquline DenmarkJohn T, MD   100 mg at 02/12/18 1756  . feeding supplement (ENSURE ENLIVE) (ENSURE ENLIVE) liquid 237 mL  237 mL Oral NOW Clapacs, John T, MD      . ferrous sulfate tablet 325 mg  325 mg Oral Q breakfast Clapacs, Jackquline DenmarkJohn T, MD   325 mg at 02/12/18 0858  . glycopyrrolate (ROBINUL) injection 0.2 mg  0.2 mg Intravenous Once Clapacs, John T, MD      . hydrOXYzine (ATARAX/VISTARIL) tablet 50 mg  50 mg Oral TID PRN Clapacs, John T, MD      . imipramine (TOFRANIL) tablet 175 mg  175 mg Oral QHS Clapacs, Jackquline DenmarkJohn T, MD   175 mg at 02/12/18 2109  .  lithium carbonate capsule 600 mg  600 mg Oral QHS Clapacs, Jackquline Denmark, MD   600 mg at 02/12/18 2108  . loratadine (CLARITIN) tablet 10 mg  10 mg Oral Daily Clapacs, Jackquline Denmark, MD   10 mg at 02/10/18 0917  . magnesium hydroxide (MILK OF MAGNESIA) suspension 30 mL  30 mL Oral Daily PRN Clapacs, John T, MD      . metFORMIN (GLUCOPHAGE) tablet 500 mg  500 mg Oral BID WC Clapacs, Jackquline Denmark, MD   500 mg at 02/12/18 1756  . metoprolol succinate (TOPROL-XL) 24 hr tablet 150 mg  150 mg Oral Daily Clapacs, Jackquline Denmark, MD   150 mg at 02/12/18 0907  . OLANZapine (ZYPREXA) tablet 10 mg  10 mg Oral Daily Clapacs, Jackquline Denmark, MD   10 mg at 02/12/18 0858  . OLANZapine (ZYPREXA) tablet 15 mg  15 mg Oral QHS Clapacs, Jackquline Denmark, MD   15 mg at  02/12/18 2108  . polyethylene glycol (MIRALAX / GLYCOLAX) packet 17 g  17 g Oral Daily Clapacs, John T, MD      . senna (SENOKOT) tablet 17.2 mg  2 tablet Oral QHS Clapacs, John T, MD   17.2 mg at 02/12/18 2108  . traZODone (DESYREL) tablet 100 mg  100 mg Oral QHS PRN Clapacs, Jackquline Denmark, MD   100 mg at 02/10/18 2151  . venlafaxine (EFFEXOR) tablet 75 mg  75 mg Oral BID Clapacs, Jackquline Denmark, MD   75 mg at 02/12/18 1756  . vitamin C (ASCORBIC ACID) tablet 250 mg  250 mg Oral Daily Clapacs, John T, MD   250 mg at 02/12/18 0900     Discharge Medications: Please see discharge summary for a list of discharge medications.  Relevant Imaging Results:  Relevant Lab Results:   Additional Information    Johny Shears, LCSW

## 2018-02-13 NOTE — Progress Notes (Signed)
Recreation Therapy Notes  INPATIENT RECREATION TR PLAN  Patient Details Name: Hamid Brookens MRN: [id number] DOB: 30-Jul-1986 Today's Date: 02/13/2018  Rec Therapy Plan Is patient appropriate for Therapeutic Recreation?: Yes Treatment times per week: at least 3 Estimated Length of Stay: 5-7 days TR Treatment/Interventions: Group participation (Comment)  Discharge Criteria Pt will be discharged from therapy if:: Discharged Treatment plan/goals/alternatives discussed and agreed upon by:: Patient/family  Discharge Summary Short term goals set:  Patient will engage in groups without prompting or encouragement from LRT x3 group sessions within 5 recreation therapy group sessions Short term goals met: Not met Reason goals not met: Patient spent all of his time in his room  Therapeutic equipment acquired: N/A Reason patient discharged from therapy: Discharge from hospital Pt/family agrees with progress & goals achieved: Yes Date patient discharged from therapy: 02/13/18   Anjali Manzella 02/13/2018, 1:13 PM

## 2018-02-13 NOTE — Anesthesia Procedure Notes (Signed)
Performed by: Chyane Greer, CRNA Pre-anesthesia Checklist: Patient identified, Emergency Drugs available, Suction available and Patient being monitored Patient Re-evaluated:Patient Re-evaluated prior to induction Oxygen Delivery Method: Circle system utilized Preoxygenation: Pre-oxygenation with 100% oxygen Induction Type: IV induction Ventilation: Mask ventilation without difficulty and Mask ventilation throughout procedure Airway Equipment and Method: Bite block Placement Confirmation: positive ETCO2 Dental Injury: Teeth and Oropharynx as per pre-operative assessment        

## 2018-02-13 NOTE — H&P (Signed)
Jonathan Alexander is an 32 y.o. male.   Chief Complaint: Patient is feeling a little bit better.  Still cannot discuss his complaints very well but feels comfortable with going home. HPI: History of schizoaffective disorder  Past Medical History:  Diagnosis Date  . Schizophrenia (HCC)   . Tachycardia     History reviewed. No pertinent surgical history.  Family History  Family history unknown: Yes   Social History:  reports that he has been smoking cigarettes.  He has smoked for the past 4.00 years. He has never used smokeless tobacco. He reports that he does not drink alcohol or use drugs.  Allergies:  Allergies  Allergen Reactions  . Divalproex Sodium   . Shellfish-Derived Products     Medications Prior to Admission  Medication Sig Dispense Refill  . clonazePAM (KLONOPIN) 1 MG tablet Take 1 mg by mouth at bedtime.    . cloZAPine (CLOZARIL) 100 MG tablet Take 200MG  by mouth every morning and 800MG  by mouth every night at bedtime    . desmopressin (DDAVP) 0.2 MG tablet Take 0.2 mg by mouth 3 (three) times daily.     Marland Kitchen docusate sodium (COLACE) 100 MG capsule Take 100 mg by mouth at bedtime.     Marland Kitchen EPINEPHrine (EPIPEN 2-PAK) 0.3 mg/0.3 mL IJ SOAJ injection Inject 0.3 mg into the muscle as directed.    . ferrous sulfate 325 (65 FE) MG tablet Take 325 mg by mouth daily with breakfast.    . imipramine (TOFRANIL) 50 MG tablet Take 175 mg by mouth at bedtime.    Marland Kitchen lithium carbonate (LITHOBID) 300 MG CR tablet Take 600 mg by mouth at bedtime.    Marland Kitchen loratadine (CLARITIN) 10 MG tablet Take 10 mg by mouth daily.     . metFORMIN (GLUCOPHAGE) 500 MG tablet Take 500 mg by mouth 2 (two) times daily with a meal.    . metoprolol succinate (TOPROL-XL) 100 MG 24 hr tablet Take 150 mg by mouth daily. Take with or immediately following a meal.    . Multiple Vitamins-Minerals (THEREMS-M) TABS Take 1 tablet by mouth every morning.    Marland Kitchen OLANZapine zydis (ZYPREXA ZYDIS) 10 MG disintegrating tablet Dissolve  2 tablets (25MG ) under the tongue every night at bedtime    . perphenazine (TRILAFON) 8 MG tablet Take 16 mg by mouth 2 (two) times daily.    . polyethylene glycol (MIRALAX / GLYCOLAX) packet Take 17 g by mouth every morning.     . senna (SENOKOT) 8.6 MG TABS tablet Take 1 tablet by mouth every morning.     . venlafaxine XR (EFFEXOR-XR) 75 MG 24 hr capsule Take 225 mg by mouth daily with breakfast.    . vitamin C (ASCORBIC ACID) 250 MG tablet Take 250 mg by mouth daily.      Results for orders placed or performed during the hospital encounter of 02/09/18 (from the past 48 hour(s))  Glucose, capillary     Status: None   Collection Time: 02/13/18  6:54 AM  Result Value Ref Range   Glucose-Capillary 98 70 - 99 mg/dL   Comment 1 Notify RN    No results found.  Review of Systems  Constitutional: Negative.   HENT: Negative.   Eyes: Negative.   Respiratory: Negative.   Cardiovascular: Negative.   Gastrointestinal: Negative.   Musculoskeletal: Negative.   Skin: Negative.   Neurological: Negative.   Psychiatric/Behavioral: Negative for depression, hallucinations, memory loss, substance abuse and suicidal ideas. The patient is nervous/anxious. The patient  does not have insomnia.     Blood pressure 99/70, pulse (!) 101, temperature 97.8 F (36.6 C), temperature source Oral, resp. rate 18, height 5\' 9"  (1.753 m), weight 93.9 kg (207 lb), SpO2 100 %. Physical Exam  Nursing note and vitals reviewed. Constitutional: He appears well-developed and well-nourished.  HENT:  Head: Normocephalic and atraumatic.  Eyes: Pupils are equal, round, and reactive to light. Conjunctivae are normal.  Neck: Normal range of motion.  Cardiovascular: Regular rhythm and normal heart sounds.  Respiratory: Effort normal. No respiratory distress.  GI: Soft.  Musculoskeletal: Normal range of motion.  Neurological: He is alert.  Skin: Skin is warm and dry.  Psychiatric: Judgment normal. His affect is blunt. His  speech is delayed. He is slowed. Thought content is not paranoid. Cognition and memory are normal. He expresses no homicidal and no suicidal ideation.     Assessment/Plan ECT today and then we are likely to discharge him back to his group home  Jonathan RasmussenJohn Scheryl Sanborn, MD 02/13/2018, 9:55 AM

## 2018-02-13 NOTE — Progress Notes (Signed)
Patient alert and oriented x 4. Ambulates unit with steady gait. Verbally denies SI/HI/AVH and pain. Patient discharged on above date and time. Verbalized understanding the discharge information provided to patient upon departure. Patient departed unit with discharge paperwork, FL2 prescriptions and personal belongings. [facility]. Paperwork given to staff per this Clinical research associatewriter. No distress noted.

## 2018-02-20 DIAGNOSIS — Z79899 Other long term (current) drug therapy: Secondary | ICD-10-CM | POA: Diagnosis not present
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
## 2018-02-23 ENCOUNTER — Telehealth: Payer: Self-pay

## 2018-02-24 ENCOUNTER — Other Ambulatory Visit: Payer: Self-pay | Admitting: Psychiatry

## 2018-02-25 ENCOUNTER — Encounter
Admission: RE | Admit: 2018-02-25 | Discharge: 2018-02-25 | Disposition: A | Discharge status: Home / Self Care | Payer: Medicare Other | Source: Ambulatory Visit | Attending: Psychiatry | Admitting: Psychiatry

## 2018-02-25 ENCOUNTER — Encounter: Payer: Self-pay | Admitting: Anesthesiology

## 2018-02-25 DIAGNOSIS — R000 Tachycardia, unspecified: Secondary | ICD-10-CM | POA: Insufficient documentation

## 2018-02-25 DIAGNOSIS — I10 Essential (primary) hypertension: Secondary | ICD-10-CM | POA: Diagnosis not present

## 2018-02-25 DIAGNOSIS — F259 Schizoaffective disorder, unspecified: Secondary | ICD-10-CM | POA: Diagnosis not present

## 2018-02-25 DIAGNOSIS — Z6830 Body mass index (BMI) 30.0-30.9, adult: Secondary | ICD-10-CM | POA: Diagnosis not present

## 2018-02-25 DIAGNOSIS — F250 Schizoaffective disorder, bipolar type: Secondary | ICD-10-CM

## 2018-02-25 DIAGNOSIS — E669 Obesity, unspecified: Secondary | ICD-10-CM | POA: Diagnosis not present

## 2018-02-25 DIAGNOSIS — F17210 Nicotine dependence, cigarettes, uncomplicated: Secondary | ICD-10-CM | POA: Insufficient documentation

## 2018-02-25 MED ORDER — MIDAZOLAM HCL 2 MG/2ML IJ SOLN: 2.0000 mg | Freq: Once | INTRAMUSCULAR | Status: DC

## 2018-02-25 MED ORDER — SUCCINYLCHOLINE CHLORIDE 20 MG/ML IJ SOLN
INTRAMUSCULAR | Status: AC
  Filled 2018-02-25: qty 1

## 2018-02-25 MED ORDER — SODIUM CHLORIDE 0.9 % IV SOLN
500.0000 mL | Freq: Once | INTRAVENOUS | Status: AC
  Administered 2018-02-25: 500 mL via INTRAVENOUS

## 2018-02-25 MED ORDER — MIDAZOLAM HCL 2 MG/2ML IJ SOLN
INTRAMUSCULAR | Status: AC
  Filled 2018-02-25: qty 2

## 2018-02-25 MED ORDER — METHOHEXITAL SODIUM 0.5 G IJ SOLR
INTRAMUSCULAR | Status: AC
  Filled 2018-02-25: qty 500

## 2018-02-25 MED ORDER — SUCCINYLCHOLINE CHLORIDE 20 MG/ML IJ SOLN
INTRAMUSCULAR | Status: DC | PRN
  Administered 2018-02-25: 100 mg via INTRAVENOUS

## 2018-02-25 MED ORDER — METHOHEXITAL SODIUM 100 MG/10ML IV SOSY
PREFILLED_SYRINGE | INTRAVENOUS | Status: DC | PRN
  Administered 2018-02-25: 70 mg via INTRAVENOUS

## 2018-02-25 MED ORDER — GLYCOPYRROLATE 0.2 MG/ML IJ SOLN
0.2000 mg | Freq: Once | INTRAMUSCULAR | Status: AC
  Administered 2018-02-25: 0.2 mg via INTRAVENOUS

## 2018-02-25 MED ORDER — GLYCOPYRROLATE 0.2 MG/ML IJ SOLN
INTRAMUSCULAR | Status: AC
  Administered 2018-02-25: 0.2 mg via INTRAVENOUS
  Filled 2018-02-25: qty 1

## 2018-02-25 MED ORDER — SODIUM CHLORIDE 0.9 % IV SOLN
INTRAVENOUS | Status: DC | PRN
  Administered 2018-02-25: 10:00:00 via INTRAVENOUS

## 2018-02-25 MED ORDER — MIDAZOLAM HCL 2 MG/2ML IJ SOLN
INTRAMUSCULAR | Status: DC | PRN
  Administered 2018-02-25: 1 mg via INTRAVENOUS

## 2018-02-25 NOTE — Anesthesia Postprocedure Evaluation (Signed)
Anesthesia Post Note  Patient: Jonathan ArDemetrio Scheer Jr.  Procedure(s) Performed: ECT TX  Patient location during evaluation: PACU Anesthesia Type: General Level of consciousness: awake and alert Pain management: pain level controlled Vital Signs Assessment: post-procedure vital signs reviewed and stable Respiratory status: spontaneous breathing, nonlabored ventilation, respiratory function stable and patient connected to nasal cannula oxygen Cardiovascular status: blood pressure returned to baseline and stable Postop Assessment: no apparent nausea or vomiting Anesthetic complications: no     Last Vitals:  Vitals:   02/25/18 1100 02/25/18 1111  BP: 116/84   Pulse: 98 96  Resp: 20 16  Temp: 36.8 C 36.8 C  SpO2: 100%     Last Pain:  Vitals:   02/25/18 1111  TempSrc: Oral  PainSc:                  Cleda MccreedyJoseph K Johnnie Goynes

## 2018-02-25 NOTE — Anesthesia Post-op Follow-up Note (Signed)
Anesthesia QCDR form completed.        

## 2018-02-25 NOTE — H&P (Signed)
Jonathan Horton MarshallSantana Jr. is an 32 y.o. male.   Chief Complaint: No new complaint HPI: History of schizoaffective disorder.  Back after his recent discharge he is stable and improved  Past Medical History:  Diagnosis Date  . Schizophrenia (HCC)   . Tachycardia     History reviewed. No pertinent surgical history.  Family History  Family history unknown: Yes   Social History:  reports that he has been smoking cigarettes.  He has smoked for the past 4.00 years. He has never used smokeless tobacco. He reports that he does not drink alcohol or use drugs.  Allergies:  Allergies  Allergen Reactions  . Divalproex Sodium   . Shellfish-Derived Products      (Not in a hospital admission)  No results found for this or any previous visit (from the past 48 hour(s)). No results found.  Review of Systems  Constitutional: Negative.   HENT: Negative.   Eyes: Negative.   Respiratory: Negative.   Cardiovascular: Negative.   Gastrointestinal: Negative.   Musculoskeletal: Negative.   Skin: Negative.   Neurological: Negative.   Psychiatric/Behavioral: Negative.     Blood pressure 110/68, pulse 98, temperature 98.7 F (37.1 C), temperature source Oral, height 5\' 9"  (1.753 m), weight 93.9 kg (207 lb), SpO2 100 %. Physical Exam  Nursing note and vitals reviewed. Constitutional: He appears well-developed and well-nourished.  HENT:  Head: Normocephalic and atraumatic.  Eyes: Pupils are equal, round, and reactive to light. Conjunctivae are normal.  Neck: Normal range of motion.  Cardiovascular: Regular rhythm and normal heart sounds.  Respiratory: Effort normal.  GI: Soft.  Musculoskeletal: Normal range of motion.  Neurological: He is alert.  Skin: Skin is warm and dry.  Psychiatric: He has a normal mood and affect. His behavior is normal. Judgment and thought content normal.     Assessment/Plan Doing well treatment today follow-up 3 weeks  Jonathan RasmussenJohn Jimya Ciani, MD 02/25/2018, 10:10 AM

## 2018-02-25 NOTE — Anesthesia Procedure Notes (Signed)
Date/Time: 02/25/2018 10:20 AM Performed by: Henrietta HooverPope, Tory Mckissack, CRNA Pre-anesthesia Checklist: Patient identified, Emergency Drugs available, Patient being monitored, Suction available and Timeout performed Patient Re-evaluated:Patient Re-evaluated prior to induction Oxygen Delivery Method: Nasal cannula Placement Confirmation: positive ETCO2

## 2018-02-25 NOTE — Procedures (Signed)
ECT SERVICES Physician's Interval Evaluation & Treatment Note  Patient Identification: Jonathan Alexander MarshallSantana Jr. MRN:  [id number] Date of Evaluation:  02/25/2018 TX #: 23  MADRS:   MMSE:   P.E. Findings:  No change to physical exam.  Vitals normal.  Heart and lungs normal.  Psychiatric Interval Note:  Back to baseline mental status.  Mood blunted no complaints  Subjective:  Patient is a 32 y.o. male seen for evaluation for Electroconvulsive Therapy. No complaint  Treatment Summary:   []   Right Unilateral             [x]  Bilateral   % Energy : 1.0 ms 100%   Impedance: 850 ohms  Seizure Energy Index: 15,673 V squared  Postictal Suppression Index: 97%  Seizure Concordance Index: 98%  Medications  Pre Shock: Robinul 0.2 mg, Brevital 70 mg, succinylcholine 100 mg  Post Shock: Versed 1 mg  Seizure Duration: 24 seconds by EMG 22 seconds by EEG   Comments: Follow-up in 3 weeks  Lungs:  [x]   Clear to auscultation               []  Other:   Heart:    [x]   Regular rhythm             []  irregular rhythm    [x]   Previous H&P reviewed, patient examined and there are NO CHANGES                 []   Previous H&P reviewed, patient examined and there are changes noted.   Mordecai RasmussenJohn Sanjay Broadfoot, MD 7/24/201910:12 AM

## 2018-02-25 NOTE — Anesthesia Preprocedure Evaluation (Signed)
Anesthesia Evaluation  Patient identified by MRN, date of birth, ID band Patient awake    Reviewed: Allergy & Precautions, NPO status , Patient's Chart, lab work & pertinent test results, reviewed documented beta blocker date and time   History of Anesthesia Complications Negative for: history of anesthetic complications  Airway Mallampati: II  TM Distance: >3 FB     Dental  (+) Chipped, Poor Dentition   Pulmonary neg sleep apnea, neg COPD, Current Smoker, former smoker,    breath sounds clear to auscultation- rhonchi (-) wheezing      Cardiovascular Exercise Tolerance: Good hypertension, (-) CAD, (-) Past MI and (-) Cardiac Stents  Rhythm:Regular Rate:Normal - Systolic murmurs and - Diastolic murmurs    Neuro/Psych PSYCHIATRIC DISORDERS Schizophrenia negative neurological ROS     GI/Hepatic negative GI ROS, Neg liver ROS,   Endo/Other  negative endocrine ROSneg diabetes  Renal/GU negative Renal ROS     Musculoskeletal negative musculoskeletal ROS (+)   Abdominal (+) + obese,   Peds  Hematology negative hematology ROS (+)   Anesthesia Other Findings Past Medical History: No date: Schizophrenia (HCC) No date: Tachycardia   Reproductive/Obstetrics                             Anesthesia Physical  Anesthesia Plan  ASA: III  Anesthesia Plan: General   Post-op Pain Management:    Induction: Intravenous  PONV Risk Score and Plan: 2 and Ondansetron  Airway Management Planned: Mask  Additional Equipment:   Intra-op Plan:   Post-operative Plan:   Informed Consent: I have reviewed the patients History and Physical, chart, labs and discussed the procedure including the risks, benefits and alternatives for the proposed anesthesia with the patient or authorized representative who has indicated his/her understanding and acceptance.   Dental Advisory Given  Plan Discussed with: CRNA  and Anesthesiologist  Anesthesia Plan Comments: (Patient consented for risks of anesthesia including but not limited to:  - adverse reactions to medications - risk of intubation if required - damage to teeth, lips or other oral mucosa - sore throat or hoarseness - Damage to heart, brain, lungs or loss of life  Patient voiced understanding.)        Anesthesia Quick Evaluation

## 2018-02-25 NOTE — Transfer of Care (Signed)
Immediate Anesthesia Transfer of Care Note  Patient: Jonathan ArDemetrio Siefker Jr.  Procedure(s) Performed: ECT TX  Patient Location:  PACU   Anesthesia Type:General  Level of Consciousness: sedated  Airway & Oxygen Therapy: Patient Spontanous Breathing and Patient connected to face mask oxygen  Post-op Assessment: Report given to RN and Post -op Vital signs reviewed and stable  Post vital signs: Reviewed and stable  Last Vitals:  Vitals Value Taken Time  BP    Temp    Pulse 94 02/25/2018 10:29 AM  Resp    SpO2 100 % 02/25/2018 10:29 AM  Vitals shown include unvalidated device data.  Last Pain:  Vitals:   02/25/18 0841  TempSrc:   PainSc: 0-No pain         Complications: No apparent anesthesia complications

## 2018-02-25 NOTE — Discharge Instructions (Signed)
1)  The drugs that you have been given will stay in your system until tomorrow so for the       next 24 hours you should not:  A. Drive an automobile  B. Make any legal decisions  C. Drink any alcoholic beverages  2)  You may resume your regular meals upon return home.  3)  A responsible adult must take you home.  Someone should stay with you for a few          hours, then be available by phone for the remainder of the treatment day.  4)  You May experience any of the following symptoms:  Headache, Nausea and a dry mouth (due to the medications you were given),  temporary memory loss and some confusion, or sore muscles (a warm bath  should help this).  If you you experience any of these symptoms let us know on                your return visit.  5)  Report any of the following: any acute discomfort, severe headache, or temperature        greater than 100.5 F.   Also report any unusual redness, swelling, drainage, or pain         at your IV site.    You may report Symptoms to:  ECT PROGRAM- [place] [facility]          Phone: [phone], ECT Department           or Dr. Shary Keylapac's office [phone]  6)  Your next ECT Treatment is Wednesday August 14   We will call 2 days prior to your scheduled appointment for arrival times.  7)  Nothing to eat or drink after midnight the night before your procedure.  8)  Take morning medication     With a sip of water the morning of your procedure.  9)  Other Instructions: Call [phone] to cancel the morning of your procedure due         to illness or emergency.  10) We will call within 72 hours to assess how you are feeling.

## 2018-02-27 DIAGNOSIS — Z79899 Other long term (current) drug therapy: Secondary | ICD-10-CM | POA: Diagnosis not present

## 2018-03-06 DIAGNOSIS — Z79899 Other long term (current) drug therapy: Secondary | ICD-10-CM | POA: Diagnosis not present

## 2018-03-09 ENCOUNTER — Telehealth: Payer: Self-pay | Admitting: *Deleted

## 2018-03-13 DIAGNOSIS — Z79899 Other long term (current) drug therapy: Secondary | ICD-10-CM | POA: Diagnosis not present

## 2018-03-17 ENCOUNTER — Other Ambulatory Visit: Payer: Self-pay | Admitting: Psychiatry

## 2018-03-18 ENCOUNTER — Encounter
Admission: RE | Admit: 2018-03-18 | Discharge: 2018-03-18 | Disposition: A | Discharge status: Home / Self Care | Payer: Medicare Other | Source: Ambulatory Visit | Attending: Psychiatry | Admitting: Psychiatry

## 2018-03-18 ENCOUNTER — Encounter: Payer: Self-pay | Admitting: Anesthesiology

## 2018-03-18 DIAGNOSIS — F250 Schizoaffective disorder, bipolar type: Secondary | ICD-10-CM | POA: Diagnosis not present

## 2018-03-18 DIAGNOSIS — F259 Schizoaffective disorder, unspecified: Secondary | ICD-10-CM | POA: Diagnosis not present

## 2018-03-18 DIAGNOSIS — I10 Essential (primary) hypertension: Secondary | ICD-10-CM | POA: Diagnosis not present

## 2018-03-18 DIAGNOSIS — F17210 Nicotine dependence, cigarettes, uncomplicated: Secondary | ICD-10-CM | POA: Insufficient documentation

## 2018-03-18 MED ORDER — GLYCOPYRROLATE 0.2 MG/ML IJ SOLN
0.2000 mg | Freq: Once | INTRAMUSCULAR | Status: AC
  Administered 2018-03-18: 0.2 mg via INTRAVENOUS

## 2018-03-18 MED ORDER — SODIUM CHLORIDE 0.9 % IV SOLN
INTRAVENOUS | Status: DC | PRN
  Administered 2018-03-18: 12:00:00 via INTRAVENOUS

## 2018-03-18 MED ORDER — MIDAZOLAM HCL 2 MG/2ML IJ SOLN
INTRAMUSCULAR | Status: AC
  Filled 2018-03-18: qty 2

## 2018-03-18 MED ORDER — SUCCINYLCHOLINE CHLORIDE 20 MG/ML IJ SOLN
INTRAMUSCULAR | Status: DC | PRN
  Administered 2018-03-18: 100 mg via INTRAVENOUS

## 2018-03-18 MED ORDER — SODIUM CHLORIDE 0.9 % IV SOLN
500.0000 mL | Freq: Once | INTRAVENOUS | Status: AC
  Administered 2018-03-18: 500 mL via INTRAVENOUS

## 2018-03-18 MED ORDER — MIDAZOLAM HCL 2 MG/2ML IJ SOLN
1.0000 mg | Freq: Once | INTRAMUSCULAR | Status: AC
  Administered 2018-03-18: 2 mg via INTRAVENOUS

## 2018-03-18 MED ORDER — GLYCOPYRROLATE 0.2 MG/ML IJ SOLN
INTRAMUSCULAR | Status: AC
  Filled 2018-03-18: qty 1

## 2018-03-18 MED ORDER — METHOHEXITAL SODIUM 100 MG/10ML IV SOSY
PREFILLED_SYRINGE | INTRAVENOUS | Status: DC | PRN
  Administered 2018-03-18: 70 mg via INTRAVENOUS

## 2018-03-18 NOTE — Anesthesia Postprocedure Evaluation (Signed)
Anesthesia Post Note  Patient: Jonathan ArDemetrio Pair Jr.  Procedure(s) Performed: ECT TX  Patient location during evaluation: PACU Anesthesia Type: General Level of consciousness: awake and alert Pain management: pain level controlled Vital Signs Assessment: post-procedure vital signs reviewed and stable Respiratory status: spontaneous breathing, nonlabored ventilation and respiratory function stable Cardiovascular status: blood pressure returned to baseline and stable Postop Assessment: no apparent nausea or vomiting Anesthetic complications: no     Last Vitals:  Vitals:   03/18/18 1242 03/18/18 1312  BP: 121/86 103/68  Pulse: 96 96  Resp: 18 18  Temp: 36.9 C   SpO2: 100%     Last Pain:  Vitals:   03/18/18 1312  TempSrc:   PainSc: 0-No pain                 Jonathan Alexander

## 2018-03-18 NOTE — Anesthesia Preprocedure Evaluation (Signed)
Anesthesia Evaluation  Patient identified by MRN, date of birth, ID band Patient awake    Reviewed: Allergy & Precautions, H&P , NPO status , reviewed documented beta blocker date and time   Airway Mallampati: II  TM Distance: >3 FB     Dental  (+) Chipped, Missing   Pulmonary Current Smoker,    Pulmonary exam normal        Cardiovascular hypertension, Normal cardiovascular exam     Neuro/Psych PSYCHIATRIC DISORDERS Bipolar Disorder Schizophrenia    GI/Hepatic   Endo/Other    Renal/GU      Musculoskeletal   Abdominal   Peds  Hematology   Anesthesia Other Findings Past Medical History: No date: Schizophrenia (HCC) No date: Tachycardia  No past surgical history on file.     Reproductive/Obstetrics                             Anesthesia Physical Anesthesia Plan  ASA: III  Anesthesia Plan: General   Post-op Pain Management:    Induction: Intravenous  PONV Risk Score and Plan: Treatment may vary due to age or medical condition and TIVA  Airway Management Planned: Mask  Additional Equipment:   Intra-op Plan:   Post-operative Plan:   Informed Consent: I have reviewed the patients History and Physical, chart, labs and discussed the procedure including the risks, benefits and alternatives for the proposed anesthesia with the patient or authorized representative who has indicated his/her understanding and acceptance.   Dental Advisory Given  Plan Discussed with: CRNA  Anesthesia Plan Comments:         Anesthesia Quick Evaluation

## 2018-03-18 NOTE — Anesthesia Post-op Follow-up Note (Signed)
Anesthesia QCDR form completed.        

## 2018-03-18 NOTE — Transfer of Care (Signed)
Immediate Anesthesia Transfer of Care Note  Patient: Jonathan ArDemetrio Suddeth Jr.  Procedure(s) Performed: ECT TX  Patient Location: PACU  Anesthesia Type:General  Level of Consciousness: sedated  Airway & Oxygen Therapy: Patient Spontanous Breathing and Patient connected to face mask oxygen  Post-op Assessment: Report given to RN and Post -op Vital signs reviewed and stable  Post vital signs: Reviewed and stable  Last Vitals:  Vitals Value Taken Time  BP 121/86 03/18/2018 12:42 PM  Temp 36.9 C 03/18/2018 12:42 PM  Pulse 93 03/18/2018 12:44 PM  Resp 28 03/18/2018 12:44 PM  SpO2 100 % 03/18/2018 12:44 PM  Vitals shown include unvalidated device data.  Last Pain:  Vitals:   03/18/18 0921  TempSrc: Oral  PainSc:          Complications: No apparent anesthesia complications

## 2018-03-18 NOTE — Discharge Instructions (Signed)
1)  The drugs that you have been given will stay in your system until tomorrow so for the       next 24 hours you should not:  A. Drive an automobile  B. Make any legal decisions  C. Drink any alcoholic beverages  2)  You may resume your regular meals upon return home.  3)  A responsible adult must take you home.  Someone should stay with you for a few          hours, then be available by phone for the remainder of the treatment day.  4)  You May experience any of the following symptoms:  Headache, Nausea and a dry mouth (due to the medications you were given),  temporary memory loss and some confusion, or sore muscles (a warm bath  should help this).  If you you experience any of these symptoms let us know on                your return visit.  5)  Report any of the following: any acute discomfort, severe headache, or temperature        greater than 100.5 F.   Also report any unusual redness, swelling, drainage, or pain         at your IV site.    You may report Symptoms to:  ECT PROGRAM- [place] [facility]          Phone: [phone], ECT Department           or Dr. Shary Keylapac's office [phone]  6)  Your next ECT Treatment is Wednesday August 28   We will call 2 days prior to your scheduled appointment for arrival times.  7)  Nothing to eat or drink after midnight the night before your procedure.  8)  Take      With a sip of water the morning of your procedure.  9)  Other Instructions: Call [phone] to cancel the morning of your procedure due         to illness or emergency.  10) We will call within 72 hours to assess how you are feeling.

## 2018-03-18 NOTE — Procedures (Signed)
ECT SERVICES Physician's Interval Evaluation & Treatment Note  Patient Identification: Jonathan Horton MarshallSantana Jr. MRN:  [id number] Date of Evaluation:  03/18/2018 TX #: 24  MADRS:   MMSE:   P.E. Findings:  No change physical exam  Psychiatric Interval Note:  Stable.  Does not say very much.  Denies paranoia or psychosis.  Reports mood is stable.  Behavior calm.  Subjective:  Patient is a 32 y.o. male seen for evaluation for Electroconvulsive Therapy. No complaints.  Treatment Summary:   []   Right Unilateral             [x]  Bilateral   % Energy : 1.0 ms 100%   Impedance: 760 ohms  Seizure Energy Index: 16,335 V squared  Postictal Suppression Index: 90%  Seizure Concordance Index: 84%  Medications  Pre Shock: Robinul 0.2 mg Brevital 70 mg succinylcholine 100 mg  Post Shock: Versed 2 mg  Seizure Duration: 26 seconds EMG 26 seconds EEG   Comments: Follow-up 2 weeks  Lungs:  [x]   Clear to auscultation               []  Other:   Heart:    [x]   Regular rhythm             []  irregular rhythm    [x]   Previous H&P reviewed, patient examined and there are NO CHANGES                 []   Previous H&P reviewed, patient examined and there are changes noted.   [name], MD 8/14/201912:23 PM

## 2018-03-18 NOTE — H&P (Signed)
Jonathan Alexander. is an 32 y.o. male.   Chief Complaint: Specific complaint HPI: Long-standing schizoaffective disorder which responds well to both medicine and ECT.  Recently had a decompensation that responded well with a short hospitalization and is now getting back into stability  Past Medical History:  Diagnosis Date  . Schizophrenia (HCC)   . Tachycardia     History reviewed. No pertinent surgical history.  Family History  Family history unknown: Yes   Social History:  reports that he has been smoking cigarettes. He has smoked for the past 4.00 years. He has never used smokeless tobacco. He reports that he does not drink alcohol or use drugs.  Allergies:  Allergies  Allergen Reactions  . Divalproex Sodium   . Shellfish-Derived Products      (Not in a hospital admission)  No results found for this or any previous visit (from the past 48 hour(s)). No results found.  Review of Systems  Constitutional: Negative.   HENT: Negative.   Eyes: Negative.   Respiratory: Negative.   Cardiovascular: Negative.   Gastrointestinal: Negative.   Musculoskeletal: Negative.   Skin: Negative.   Neurological: Negative.   Psychiatric/Behavioral: Negative.     Blood pressure 128/80, pulse 89, temperature 97.7 F (36.5 C), temperature source Oral, resp. rate 16, height 5\' 9"  (1.753 m), weight 93.9 kg. Physical Exam  Nursing note and vitals reviewed. Constitutional: He appears well-developed and well-nourished.  HENT:  Head: Normocephalic and atraumatic.  Eyes: Pupils are equal, round, and reactive to light. Conjunctivae are normal.  Neck: Normal range of motion.  Cardiovascular: Regular rhythm and normal heart sounds.  Respiratory: Effort normal. No respiratory distress.  GI: Soft.  Musculoskeletal: Normal range of motion.  Neurological: He is alert.  Skin: Skin is warm and dry.  Psychiatric: Judgment normal. His affect is blunt. His speech is delayed. He is slowed. Thought  content is not paranoid. Cognition and memory are normal. He expresses no homicidal and no suicidal ideation.     Assessment/Plan Continue every 4 weeks ECT along with medication management  [name], MD 03/18/2018, 12:21 PM

## 2018-03-18 NOTE — Anesthesia Procedure Notes (Signed)
Date/Time: 03/18/2018 12:31 PM Performed by: Junious SilkNoles, Kaniah Rizzolo, CRNA Pre-anesthesia Checklist: Patient identified, Emergency Drugs available, Suction available, Patient being monitored and Timeout performed Oxygen Delivery Method: Ambu bag

## 2018-03-20 DIAGNOSIS — Z79899 Other long term (current) drug therapy: Secondary | ICD-10-CM | POA: Diagnosis not present

## 2018-03-27 ENCOUNTER — Other Ambulatory Visit: Payer: Self-pay | Admitting: Psychiatry

## 2018-03-27 DIAGNOSIS — Z79899 Other long term (current) drug therapy: Secondary | ICD-10-CM | POA: Diagnosis not present

## 2018-03-30 ENCOUNTER — Other Ambulatory Visit: Payer: Self-pay | Admitting: Psychiatry

## 2018-03-30 ENCOUNTER — Telehealth: Payer: Self-pay | Admitting: *Deleted

## 2018-04-03 DIAGNOSIS — Z79899 Other long term (current) drug therapy: Secondary | ICD-10-CM | POA: Diagnosis not present

## 2018-04-08 ENCOUNTER — Telehealth: Payer: Self-pay | Admitting: *Deleted

## 2018-04-10 DIAGNOSIS — Z79899 Other long term (current) drug therapy: Secondary | ICD-10-CM | POA: Diagnosis not present

## 2018-04-17 DIAGNOSIS — Z79899 Other long term (current) drug therapy: Secondary | ICD-10-CM | POA: Diagnosis not present

## 2018-04-20 ENCOUNTER — Telehealth: Payer: Self-pay | Admitting: *Deleted

## 2018-04-22 ENCOUNTER — Encounter
Admission: RE | Admit: 2018-04-22 | Discharge: 2018-04-22 | Disposition: A | Discharge status: Home / Self Care | Payer: Medicare Other | Source: Ambulatory Visit | Attending: Psychiatry | Admitting: Psychiatry

## 2018-04-22 ENCOUNTER — Encounter: Payer: Self-pay | Admitting: Anesthesiology

## 2018-04-22 ENCOUNTER — Other Ambulatory Visit: Payer: Self-pay | Admitting: Psychiatry

## 2018-04-22 DIAGNOSIS — I10 Essential (primary) hypertension: Secondary | ICD-10-CM | POA: Diagnosis not present

## 2018-04-22 DIAGNOSIS — E669 Obesity, unspecified: Secondary | ICD-10-CM | POA: Diagnosis not present

## 2018-04-22 DIAGNOSIS — F17210 Nicotine dependence, cigarettes, uncomplicated: Secondary | ICD-10-CM | POA: Diagnosis not present

## 2018-04-22 DIAGNOSIS — F259 Schizoaffective disorder, unspecified: Secondary | ICD-10-CM | POA: Diagnosis not present

## 2018-04-22 DIAGNOSIS — F319 Bipolar disorder, unspecified: Secondary | ICD-10-CM | POA: Diagnosis not present

## 2018-04-22 DIAGNOSIS — F250 Schizoaffective disorder, bipolar type: Secondary | ICD-10-CM | POA: Diagnosis not present

## 2018-04-22 MED ORDER — GLYCOPYRROLATE 0.2 MG/ML IJ SOLN
0.2000 mg | Freq: Once | INTRAMUSCULAR | Status: AC
  Administered 2018-04-22: 0.2 mg via INTRAVENOUS

## 2018-04-22 MED ORDER — SODIUM CHLORIDE 0.9 % IV SOLN
500.0000 mL | Freq: Once | INTRAVENOUS | Status: AC
  Administered 2018-04-22: 500 mL via INTRAVENOUS

## 2018-04-22 MED ORDER — METHOHEXITAL SODIUM 100 MG/10ML IV SOSY
PREFILLED_SYRINGE | INTRAVENOUS | Status: DC | PRN
  Administered 2018-04-22: 70 mg via INTRAVENOUS

## 2018-04-22 MED ORDER — MIDAZOLAM HCL 2 MG/2ML IJ SOLN: 2.0000 mg | Freq: Once | INTRAMUSCULAR | Status: DC

## 2018-04-22 MED ORDER — SODIUM CHLORIDE 0.9 % IV SOLN
INTRAVENOUS | Status: DC | PRN
  Administered 2018-04-22 (×2): via INTRAVENOUS

## 2018-04-22 MED ORDER — GLYCOPYRROLATE 0.2 MG/ML IJ SOLN
INTRAMUSCULAR | Status: AC
  Filled 2018-04-22: qty 1

## 2018-04-22 MED ORDER — MIDAZOLAM HCL 2 MG/2ML IJ SOLN
INTRAMUSCULAR | Status: DC | PRN
  Administered 2018-04-22: 1 mg via INTRAVENOUS

## 2018-04-22 MED ORDER — MIDAZOLAM HCL 2 MG/2ML IJ SOLN
INTRAMUSCULAR | Status: AC
  Filled 2018-04-22: qty 2

## 2018-04-22 MED ORDER — SUCCINYLCHOLINE CHLORIDE 200 MG/10ML IV SOSY
PREFILLED_SYRINGE | INTRAVENOUS | Status: DC | PRN
  Administered 2018-04-22: 100 mg via INTRAVENOUS

## 2018-04-22 MED ORDER — SUCCINYLCHOLINE CHLORIDE 20 MG/ML IJ SOLN
INTRAMUSCULAR | Status: AC
  Filled 2018-04-22: qty 1

## 2018-04-22 NOTE — Anesthesia Post-op Follow-up Note (Signed)
Anesthesia QCDR form completed.        

## 2018-04-22 NOTE — Anesthesia Preprocedure Evaluation (Signed)
Anesthesia Evaluation  Patient identified by MRN, date of birth, ID band Patient awake    Reviewed: Allergy & Precautions, NPO status , Patient's Chart, lab work & pertinent test results, reviewed documented beta blocker date and time   History of Anesthesia Complications Negative for: history of anesthetic complications  Airway Mallampati: II  TM Distance: >3 FB     Dental  (+) Chipped   Pulmonary neg sleep apnea, neg COPD, Current Smoker, former smoker,    breath sounds clear to auscultation- rhonchi (-) wheezing      Cardiovascular Exercise Tolerance: Good hypertension, (-) CAD, (-) Past MI and (-) Cardiac Stents  Rhythm:Regular Rate:Normal - Systolic murmurs and - Diastolic murmurs    Neuro/Psych PSYCHIATRIC DISORDERS Bipolar Disorder Schizophrenia negative neurological ROS     GI/Hepatic negative GI ROS, Neg liver ROS,   Endo/Other  negative endocrine ROSneg diabetes  Renal/GU negative Renal ROS     Musculoskeletal negative musculoskeletal ROS (+)   Abdominal (+) + obese,   Peds  Hematology negative hematology ROS (+)   Anesthesia Other Findings   Reproductive/Obstetrics                             Anesthesia Physical  Anesthesia Plan  ASA: III  Anesthesia Plan: General   Post-op Pain Management:    Induction: Intravenous  PONV Risk Score and Plan: 2 and Ondansetron  Airway Management Planned: Mask  Additional Equipment:   Intra-op Plan:   Post-operative Plan:   Informed Consent: I have reviewed the patients History and Physical, chart, labs and discussed the procedure including the risks, benefits and alternatives for the proposed anesthesia with the patient or authorized representative who has indicated his/her understanding and acceptance.     Plan Discussed with: CRNA and Anesthesiologist  Anesthesia Plan Comments:         Anesthesia Quick Evaluation

## 2018-04-22 NOTE — Discharge Instructions (Signed)
1)  The drugs that you have been given will stay in your system until tomorrow so for the       next 24 hours you should not:  A. Drive an automobile  B. Make any legal decisions  C. Drink any alcoholic beverages  2)  You may resume your regular meals upon return home.  3)  A responsible adult must take you home.  Someone should stay with you for a few          hours, then be available by phone for the remainder of the treatment day.  4)  You May experience any of the following symptoms:  Headache, Nausea and a dry mouth (due to the medications you were given),  temporary memory loss and some confusion, or sore muscles (a warm bath  should help this).  If you you experience any of these symptoms let us know on                your return visit.  5)  Report any of the following: any acute discomfort, severe headache, or temperature        greater than 100.5 F.   Also report any unusual redness, swelling, drainage, or pain         at your IV site.    You may report Symptoms to:  ECT [facility]          Phone: [phone], ECT Department           or Dr. Shary Keylapac's office [phone]  6)  Your next ECT Treatment is Day Wednesday  Date May 20, 2018  We will call 2 days prior to your scheduled appointment for arrival times.  7)  Nothing to eat or drink after midnight the night before your procedure.  8)  Take .     With a sip of water the morning of your procedure.  9)  Other Instructions: Call [phone] to cancel the morning of your procedure due         to illness or emergency.  10) We will call within 72 hours to assess how you are feeling.

## 2018-04-22 NOTE — H&P (Signed)
Jonathan Horton MarshallSantana Jr. is an 32 y.o. male.   Chief Complaint: No new complaint HPI: Schizoaffective disorder chronic stability with the use of medication and maintenance ECT  Past Medical History:  Diagnosis Date  . Schizophrenia (HCC)   . Tachycardia     No past surgical history on file.  Family History  Family history unknown: Yes   Social History:  reports that he has been smoking cigarettes. He has smoked for the past 4.00 years. He has never used smokeless tobacco. He reports that he does not drink alcohol or use drugs.  Allergies:  Allergies  Allergen Reactions  . Divalproex Sodium   . Shellfish-Derived Products      (Not in a hospital admission)  No results found for this or any previous visit (from the past 48 hour(s)). No results found.  Review of Systems  Constitutional: Negative.   HENT: Negative.   Eyes: Negative.   Respiratory: Negative.   Cardiovascular: Negative.   Gastrointestinal: Negative.   Musculoskeletal: Negative.   Skin: Negative.   Neurological: Negative.   Psychiatric/Behavioral: Negative.     Blood pressure 132/83, pulse 87, temperature 97.9 F (36.6 C), temperature source Oral, resp. rate 18, height 6\' 1"  (1.854 m), weight 93.9 kg, SpO2 98 %. Physical Exam  Nursing note and vitals reviewed. Constitutional: He appears well-developed and well-nourished.  HENT:  Head: Normocephalic and atraumatic.  Eyes: Pupils are equal, round, and reactive to light. Conjunctivae are normal.  Neck: Normal range of motion.  Cardiovascular: Regular rhythm and normal heart sounds.  Respiratory: Effort normal. No respiratory distress.  GI: Soft.  Musculoskeletal: Normal range of motion.  Neurological: He is alert.  Skin: Skin is warm and dry.  Psychiatric: He has a normal mood and affect. His behavior is normal. Judgment and thought content normal.     Assessment/Plan Patient is doing well tolerating current interval follow-up 1 month  Jonathan RasmussenJohn Hafiz Irion,  MD 04/22/2018, 11:22 AM

## 2018-04-22 NOTE — Anesthesia Procedure Notes (Signed)
Date/Time: 04/22/2018 11:29 AM Performed by: Lily KocherPeralta, Malin Cervini, CRNA Pre-anesthesia Checklist: Patient identified, Emergency Drugs available, Suction available and Patient being monitored Patient Re-evaluated:Patient Re-evaluated prior to induction Oxygen Delivery Method: Circle system utilized Preoxygenation: Pre-oxygenation with 100% oxygen Induction Type: IV induction Ventilation: Mask ventilation without difficulty and Mask ventilation throughout procedure Airway Equipment and Method: Bite block Placement Confirmation: positive ETCO2 Dental Injury: Teeth and Oropharynx as per pre-operative assessment

## 2018-04-22 NOTE — Transfer of Care (Signed)
Immediate Anesthesia Transfer of Care Note  Patient: Jonathan ArDemetrio Illes Jr.  Procedure(s) Performed: ECT TX  Patient Location: PACU  Anesthesia Type:General  Level of Consciousness: sedated  Airway & Oxygen Therapy: Patient Spontanous Breathing and Patient connected to face mask oxygen  Post-op Assessment: Report given to RN and Post -op Vital signs reviewed and stable  Post vital signs: Reviewed and stable  Last Vitals:  Vitals Value Taken Time  BP    Temp    Pulse 95 04/22/2018 11:38 AM  Resp 13 04/22/2018 11:38 AM  SpO2 100 % 04/22/2018 11:38 AM  Vitals shown include unvalidated device data.  Last Pain:  Vitals:   04/22/18 0916  TempSrc:   PainSc: 0-No pain         Complications: No apparent anesthesia complications

## 2018-04-22 NOTE — Anesthesia Postprocedure Evaluation (Signed)
Anesthesia Post Note  Patient: Jonathan ArDemetrio Stille Jr.  Procedure(s) Performed: ECT TX  Patient location during evaluation: PACU Anesthesia Type: General Level of consciousness: awake and alert Pain management: pain level controlled Vital Signs Assessment: post-procedure vital signs reviewed and stable Respiratory status: spontaneous breathing and respiratory function stable Cardiovascular status: stable Anesthetic complications: no     Last Vitals:  Vitals:   04/22/18 1207 04/22/18 1218  BP: 109/72 102/86  Pulse: 83 (!) 104  Resp: 11 16  Temp:    SpO2: 100%     Last Pain:  Vitals:   04/22/18 1218  TempSrc:   PainSc: 0-No pain                 Lavoy Bernards K

## 2018-04-22 NOTE — Procedures (Signed)
ECT SERVICES Physician's Interval Evaluation & Treatment Note  Patient Identification: Jonathan Horton MarshallSantana Jr. MRN:  [id number] Date of Evaluation:  04/22/2018 TX #: 25  MADRS:   MMSE:   P.E. Findings:  No change to physical exam  Psychiatric Interval Note:  Mood about the same as usual no sign of worsening mood or worsening psychosis.  Subjective:  Patient is a 32 y.o. male seen for evaluation for Electroconvulsive Therapy. No new complaint  Treatment Summary:   []   Right Unilateral             [x]  Bilateral   % Energy : 1.0 ms 100%   Impedance: 970 ohms  Seizure Energy Index: 22,622 V squared  Postictal Suppression Index: 92%  Seizure Concordance Index: 97%  Medications  Pre Shock: Robinul 0.2 mg Brevital 70 mg succinylcholine 100 mg  Post Shock: Versed 2 mg  Seizure Duration: 24 seconds EMG 26 seconds EEG   Comments: Follow-up in 1 month  Lungs:  [x]   Clear to auscultation               []  Other:   Heart:    [x]   Regular rhythm             []  irregular rhythm    [x]   Previous H&P reviewed, patient examined and there are NO CHANGES                 []   Previous H&P reviewed, patient examined and there are changes noted.   Jonathan RasmussenJohn Evagelia Knack, MD 9/18/201911:23 AM

## 2018-04-24 DIAGNOSIS — Z79899 Other long term (current) drug therapy: Secondary | ICD-10-CM | POA: Diagnosis not present

## 2018-05-01 ENCOUNTER — Other Ambulatory Visit: Payer: Self-pay | Admitting: Psychiatry

## 2018-05-01 DIAGNOSIS — Z79899 Other long term (current) drug therapy: Secondary | ICD-10-CM | POA: Diagnosis not present

## 2018-05-08 DIAGNOSIS — Z79899 Other long term (current) drug therapy: Secondary | ICD-10-CM | POA: Diagnosis not present

## 2018-05-11 DIAGNOSIS — J301 Allergic rhinitis due to pollen: Secondary | ICD-10-CM | POA: Diagnosis not present

## 2018-05-11 DIAGNOSIS — D649 Anemia, unspecified: Secondary | ICD-10-CM | POA: Diagnosis not present

## 2018-05-11 DIAGNOSIS — L272 Dermatitis due to ingested food: Secondary | ICD-10-CM | POA: Diagnosis not present

## 2018-05-11 DIAGNOSIS — E669 Obesity, unspecified: Secondary | ICD-10-CM | POA: Diagnosis not present

## 2018-05-11 DIAGNOSIS — R748 Abnormal levels of other serum enzymes: Secondary | ICD-10-CM | POA: Diagnosis not present

## 2018-05-11 DIAGNOSIS — Z23 Encounter for immunization: Secondary | ICD-10-CM | POA: Diagnosis not present

## 2018-05-11 DIAGNOSIS — N3944 Nocturnal enuresis: Secondary | ICD-10-CM | POA: Diagnosis not present

## 2018-05-11 DIAGNOSIS — K5900 Constipation, unspecified: Secondary | ICD-10-CM | POA: Diagnosis not present

## 2018-05-11 DIAGNOSIS — F259 Schizoaffective disorder, unspecified: Secondary | ICD-10-CM | POA: Diagnosis not present

## 2018-05-15 DIAGNOSIS — Z79899 Other long term (current) drug therapy: Secondary | ICD-10-CM | POA: Diagnosis not present

## 2018-05-19 ENCOUNTER — Other Ambulatory Visit: Payer: Self-pay | Admitting: Psychiatry

## 2018-05-20 ENCOUNTER — Encounter
Admission: RE | Admit: 2018-05-20 | Discharge: 2018-05-20 | Disposition: A | Discharge status: Home / Self Care | Payer: Medicare Other | Source: Ambulatory Visit | Attending: Psychiatry | Admitting: Psychiatry

## 2018-05-20 ENCOUNTER — Other Ambulatory Visit: Payer: Self-pay | Admitting: Psychiatry

## 2018-05-20 DIAGNOSIS — F17210 Nicotine dependence, cigarettes, uncomplicated: Secondary | ICD-10-CM | POA: Insufficient documentation

## 2018-05-20 DIAGNOSIS — F250 Schizoaffective disorder, bipolar type: Secondary | ICD-10-CM

## 2018-05-20 DIAGNOSIS — I10 Essential (primary) hypertension: Secondary | ICD-10-CM | POA: Diagnosis not present

## 2018-05-20 DIAGNOSIS — F259 Schizoaffective disorder, unspecified: Secondary | ICD-10-CM | POA: Diagnosis not present

## 2018-05-20 DIAGNOSIS — E119 Type 2 diabetes mellitus without complications: Secondary | ICD-10-CM | POA: Diagnosis not present

## 2018-05-20 MED ORDER — MIDAZOLAM HCL 2 MG/2ML IJ SOLN
INTRAMUSCULAR | Status: AC
  Filled 2018-05-20: qty 2

## 2018-05-20 MED ORDER — MIDAZOLAM HCL 2 MG/2ML IJ SOLN: 2.0000 mg | Freq: Once | INTRAMUSCULAR | Status: DC

## 2018-05-20 MED ORDER — MIDAZOLAM HCL 5 MG/5ML IJ SOLN
INTRAMUSCULAR | Status: DC | PRN
  Administered 2018-05-20: 1 mg via INTRAVENOUS

## 2018-05-20 MED ORDER — GLYCOPYRROLATE 0.2 MG/ML IJ SOLN
0.2000 mg | Freq: Once | INTRAMUSCULAR | Status: AC
  Administered 2018-05-20: 0.2 mg via INTRAVENOUS

## 2018-05-20 MED ORDER — GLYCOPYRROLATE 0.2 MG/ML IJ SOLN
INTRAMUSCULAR | Status: AC
  Administered 2018-05-20: 0.2 mg via INTRAVENOUS
  Filled 2018-05-20: qty 1

## 2018-05-20 MED ORDER — SUCCINYLCHOLINE CHLORIDE 20 MG/ML IJ SOLN
INTRAMUSCULAR | Status: DC | PRN
  Administered 2018-05-20: 100 mg via INTRAVENOUS

## 2018-05-20 MED ORDER — METHOHEXITAL SODIUM 100 MG/10ML IV SOSY
PREFILLED_SYRINGE | INTRAVENOUS | Status: DC | PRN
  Administered 2018-05-20: 70 mg via INTRAVENOUS

## 2018-05-20 MED ORDER — SODIUM CHLORIDE 0.9 % IV SOLN
500.0000 mL | Freq: Once | INTRAVENOUS | Status: AC
  Administered 2018-05-20: 500 mL via INTRAVENOUS

## 2018-05-20 NOTE — Progress Notes (Signed)
Patient ID: Jonathan Ar., male   DOB: Dec 20, 1985, 32 y.o.   MRN: [id number] Please discontinue Miralax for Jonathan Alexander M.D. 05/20/2018 9:52am

## 2018-05-20 NOTE — Anesthesia Postprocedure Evaluation (Signed)
Anesthesia Post Note  Patient: Jonathan Alexander.  Procedure(s) Performed: ECT TX  Patient location during evaluation: PACU Anesthesia Type: General Level of consciousness: awake and alert Pain management: pain level controlled Vital Signs Assessment: post-procedure vital signs reviewed and stable Respiratory status: spontaneous breathing, nonlabored ventilation and respiratory function stable Cardiovascular status: blood pressure returned to baseline and stable Postop Assessment: no signs of nausea or vomiting Anesthetic complications: no     Last Vitals:  Vitals:   05/20/18 1144 05/20/18 1152  BP: (!) 125/98 (!) 100/58  Pulse: 83 92  Resp: 13 18  Temp: 37.1 C 36.9 C  SpO2: 100%     Last Pain:  Vitals:   05/20/18 1152  TempSrc: Oral  PainSc: 0-No pain                 Teyonna Plaisted

## 2018-05-20 NOTE — H&P (Addendum)
Jonathan Alexander. is an 32 y.o. male.   Chief Complaint: No specific new complaint.  Some chronic anxiety but no psychosis no major depression no mood instability really HPI: History of recurrent schizoaffective disorder  Past Medical History:  Diagnosis Date  . Schizophrenia (HCC)   . Tachycardia     History reviewed. No pertinent surgical history.  Family History  Family history unknown: Yes   Social History:  reports that he has been smoking cigarettes. He has smoked for the past 4.00 years. He has never used smokeless tobacco. He reports that he does not drink alcohol or use drugs.  Allergies:  Allergies  Allergen Reactions  . Divalproex Sodium   . Shellfish-Derived Products      (Not in a hospital admission)  No results found for this or any previous visit (from the past 48 hour(s)). No results found.  Review of Systems  Constitutional: Negative.   HENT: Negative.   Eyes: Negative.   Respiratory: Negative.   Cardiovascular: Negative.   Gastrointestinal: Negative.   Musculoskeletal: Negative.   Skin: Negative.   Neurological: Negative.   Psychiatric/Behavioral: Negative for depression, hallucinations, memory loss, substance abuse and suicidal ideas. The patient is nervous/anxious. The patient does not have insomnia.     Blood pressure (!) 141/77, pulse 81, temperature 98.8 F (37.1 C), temperature source Oral, height 5\' 9"  (1.753 m), weight 91.6 kg, SpO2 98 %. Physical Exam  Nursing note and vitals reviewed. Constitutional: He appears well-developed and well-nourished.  HENT:  Head: Normocephalic and atraumatic.  Eyes: Pupils are equal, round, and reactive to light. Conjunctivae are normal.  Neck: Normal range of motion.  Cardiovascular: Regular rhythm and normal heart sounds.  Respiratory: Effort normal. No respiratory distress.  GI: Soft.  Musculoskeletal: Normal range of motion.  Neurological: He is alert.  Skin: Skin is warm and dry.  Psychiatric:  Judgment normal. His mood appears anxious. His affect is blunt. His speech is delayed. He is slowed. Thought content is not paranoid. Cognition and memory are normal. He expresses no homicidal and no suicidal ideation.     Assessment/Plan Stays under good control in part thanks to her regular ECT schedule.  Treatment today follow-up for weeks.  The patient asked me today if he could discontinue the "white powder" by which she means MiraLAX.  He says that he has very loose bowel movements every day.  This is not the first time he has mentioned it and it seems to really bother him.  Since he is making a rational connection there I do not see any reason to force it.  It was only done because of the clozapine and risk for constipation.  I have written out a signed order which can be taken back to his group home to discontinue the MiraLAX.  [name], MD 05/20/2018, 11:00 AM

## 2018-05-20 NOTE — Transfer of Care (Signed)
Immediate Anesthesia Transfer of Care Note  Patient: Jonathan Alexander.  Procedure(s) Performed: ECT TX  Patient Location: PACU  Anesthesia Type:General  Level of Consciousness: drowsy  Airway & Oxygen Therapy: Patient Spontanous Breathing and Patient connected to face mask  Post-op Assessment: Report given to RN and Post -op Vital signs reviewed and stable  Post vital signs: Reviewed and stable  Last Vitals:  Vitals Value Taken Time  BP 147/108 05/20/2018 11:14 AM  Temp 36.9 C 05/20/2018 11:14 AM  Pulse 89 05/20/2018 11:14 AM  Resp 16 05/20/2018 11:14 AM  SpO2 100 % 05/20/2018 11:14 AM    Last Pain:  Vitals:   05/20/18 1114  TempSrc: Oral  PainSc:          Complications: No apparent anesthesia complications

## 2018-05-20 NOTE — Anesthesia Post-op Follow-up Note (Signed)
Anesthesia QCDR form completed.        

## 2018-05-20 NOTE — Procedures (Signed)
ECT SERVICES Physician's Interval Evaluation & Treatment Note  Patient Identification: Jonathan Alexander. MRN:  [id number] Date of Evaluation:  05/20/2018 TX #: 26  MADRS:   MMSE:   P.E. Findings:  No change to physical exam  Psychiatric Interval Note:  Mood is stable a little bit anxious no sign of psychosis or agitation  Subjective:  Patient is a 32 y.o. male seen for evaluation for Electroconvulsive Therapy. No specific complaint  Treatment Summary:   []   Right Unilateral             [x]  Bilateral   % Energy : 1.0 ms 100%   Impedance: 1810 ohms  Seizure Energy Index: 21,693 V squared  Postictal Suppression Index: 86%  Seizure Concordance Index: 87%  Medications  Pre Shock: Robinul 0.2 mg Brevital 70 mg succinylcholine 100 mg  Post Shock: Versed 1 mg  Seizure Duration: 32 seconds by EMG 32 seconds by EEG   Comments: Follow-up 4 weeks  Lungs:  [x]   Clear to auscultation               []  Other:   Heart:    [x]   Regular rhythm             []  irregular rhythm    [x]   Previous H&P reviewed, patient examined and there are NO CHANGES                 []   Previous H&P reviewed, patient examined and there are changes noted.   [name], MD 10/16/201911:01 AM

## 2018-05-20 NOTE — Anesthesia Preprocedure Evaluation (Addendum)
Anesthesia Evaluation  Patient identified by MRN, date of birth, ID band Patient awake    Reviewed: Allergy & Precautions, NPO status , Patient's Chart, lab work & pertinent test results, reviewed documented beta blocker date and time   History of Anesthesia Complications Negative for: history of anesthetic complications  Airway Mallampati: II  TM Distance: >3 FB     Dental  (+) Chipped   Pulmonary neg sleep apnea, neg COPD, Current Smoker, former smoker,    breath sounds clear to auscultation- rhonchi (-) wheezing      Cardiovascular Exercise Tolerance: Good hypertension, (-) CAD, (-) Past MI and (-) Cardiac Stents  Rhythm:Regular Rate:Normal - Systolic murmurs and - Diastolic murmurs    Neuro/Psych PSYCHIATRIC DISORDERS Bipolar Disorder Schizophrenia negative neurological ROS     GI/Hepatic negative GI ROS, Neg liver ROS,   Endo/Other  diabetes, Oral Hypoglycemic Agents  Renal/GU negative Renal ROS     Musculoskeletal negative musculoskeletal ROS (+)   Abdominal (+) + obese,   Peds  Hematology negative hematology ROS (+)   Anesthesia Other Findings Past Medical History: No date: Schizophrenia (HCC) No date: Tachycardia   Reproductive/Obstetrics                             Anesthesia Physical  Anesthesia Plan  ASA: III  Anesthesia Plan: General   Post-op Pain Management:    Induction: Intravenous  PONV Risk Score and Plan: 2 and Ondansetron  Airway Management Planned: Mask  Additional Equipment:   Intra-op Plan:   Post-operative Plan:   Informed Consent: I have reviewed the patients History and Physical, chart, labs and discussed the procedure including the risks, benefits and alternatives for the proposed anesthesia with the patient or authorized representative who has indicated his/her understanding and acceptance.       Plan Discussed with: CRNA and  Anesthesiologist  Anesthesia Plan Comments:         Anesthesia Quick Evaluation  

## 2018-05-20 NOTE — Discharge Instructions (Signed)
1)  The drugs that you have been given will stay in your system until tomorrow so for the       next 24 hours you should not:  A. Drive an automobile  B. Make any legal decisions  C. Drink any alcoholic beverages  2)  You may resume your regular meals upon return home.  3)  A responsible adult must take you home.  Someone should stay with you for a few          hours, then be available by phone for the remainder of the treatment day.  4)  You May experience any of the following symptoms:  Headache, Nausea and a dry mouth (due to the medications you were given),  temporary memory loss and some confusion, or sore muscles (a warm bath  should help this).  If you you experience any of these symptoms let us know on                your return visit.  5)  Report any of the following: any acute discomfort, severe headache, or temperature        greater than 100.5 F.   Also report any unusual redness, swelling, drainage, or pain         at your IV site.    You may report Symptoms to:  ECT PROGRAM- [place] [facility]          Phone: [phone], ECT Department           or Dr. Shary Key office [phone]  6)  Your next ECT Treatment is Wednesday, November 13  We will call 2 days prior to your scheduled appointment for arrival times.  7)  Nothing to eat or drink after midnight the night before your procedure.  8)  Take morning meds     With a sip of water the morning of your procedure.  9)  Other Instructions: Call [phone] to cancel the morning of your procedure due         to illness or emergency.  10) We will call within 72 hours to assess how you are feeling.

## 2018-05-21 ENCOUNTER — Other Ambulatory Visit: Payer: Self-pay | Admitting: Psychiatry

## 2018-05-22 DIAGNOSIS — Z79899 Other long term (current) drug therapy: Secondary | ICD-10-CM | POA: Diagnosis not present

## 2018-05-28 DIAGNOSIS — E669 Obesity, unspecified: Secondary | ICD-10-CM | POA: Diagnosis not present

## 2018-05-29 DIAGNOSIS — Z79899 Other long term (current) drug therapy: Secondary | ICD-10-CM | POA: Diagnosis not present

## 2018-06-01 DIAGNOSIS — R748 Abnormal levels of other serum enzymes: Secondary | ICD-10-CM | POA: Diagnosis not present

## 2018-06-01 DIAGNOSIS — F259 Schizoaffective disorder, unspecified: Secondary | ICD-10-CM | POA: Diagnosis not present

## 2018-06-01 DIAGNOSIS — N3944 Nocturnal enuresis: Secondary | ICD-10-CM | POA: Diagnosis not present

## 2018-06-01 DIAGNOSIS — E669 Obesity, unspecified: Secondary | ICD-10-CM | POA: Diagnosis not present

## 2018-06-01 DIAGNOSIS — K5900 Constipation, unspecified: Secondary | ICD-10-CM | POA: Diagnosis not present

## 2018-06-01 DIAGNOSIS — D649 Anemia, unspecified: Secondary | ICD-10-CM | POA: Diagnosis not present

## 2018-06-01 DIAGNOSIS — J301 Allergic rhinitis due to pollen: Secondary | ICD-10-CM | POA: Diagnosis not present

## 2018-06-01 DIAGNOSIS — L272 Dermatitis due to ingested food: Secondary | ICD-10-CM | POA: Diagnosis not present

## 2018-06-05 DIAGNOSIS — Z79899 Other long term (current) drug therapy: Secondary | ICD-10-CM | POA: Diagnosis not present

## 2018-06-12 DIAGNOSIS — Z79899 Other long term (current) drug therapy: Secondary | ICD-10-CM | POA: Diagnosis not present

## 2018-06-15 ENCOUNTER — Telehealth: Payer: Self-pay | Admitting: *Deleted

## 2018-06-17 ENCOUNTER — Encounter: Payer: Self-pay | Admitting: Anesthesiology

## 2018-06-17 ENCOUNTER — Encounter
Admission: RE | Admit: 2018-06-17 | Discharge: 2018-06-17 | Disposition: A | Discharge status: Home / Self Care | Payer: Medicare Other | Source: Ambulatory Visit | Attending: Psychiatry | Admitting: Psychiatry

## 2018-06-17 ENCOUNTER — Other Ambulatory Visit: Payer: Self-pay | Admitting: Psychiatry

## 2018-06-17 DIAGNOSIS — F250 Schizoaffective disorder, bipolar type: Secondary | ICD-10-CM | POA: Diagnosis not present

## 2018-06-17 DIAGNOSIS — R000 Tachycardia, unspecified: Secondary | ICD-10-CM | POA: Diagnosis not present

## 2018-06-17 DIAGNOSIS — F259 Schizoaffective disorder, unspecified: Secondary | ICD-10-CM | POA: Insufficient documentation

## 2018-06-17 DIAGNOSIS — F17210 Nicotine dependence, cigarettes, uncomplicated: Secondary | ICD-10-CM | POA: Diagnosis not present

## 2018-06-17 MED ORDER — FENTANYL CITRATE (PF) 100 MCG/2ML IJ SOLN: 25.0000 ug | INTRAMUSCULAR | Status: DC | PRN

## 2018-06-17 MED ORDER — MIDAZOLAM HCL 2 MG/2ML IJ SOLN
INTRAMUSCULAR | Status: AC
  Filled 2018-06-17: qty 2

## 2018-06-17 MED ORDER — MIDAZOLAM HCL 2 MG/2ML IJ SOLN
INTRAMUSCULAR | Status: DC | PRN
  Administered 2018-06-17: 1 mg via INTRAVENOUS

## 2018-06-17 MED ORDER — SODIUM CHLORIDE 0.9 % IV SOLN
500.0000 mL | Freq: Once | INTRAVENOUS | Status: AC
  Administered 2018-06-17: 500 mL via INTRAVENOUS

## 2018-06-17 MED ORDER — SUCCINYLCHOLINE CHLORIDE 20 MG/ML IJ SOLN
INTRAMUSCULAR | Status: AC
  Filled 2018-06-17: qty 1

## 2018-06-17 MED ORDER — METHOHEXITAL SODIUM 100 MG/10ML IV SOSY
PREFILLED_SYRINGE | INTRAVENOUS | Status: DC | PRN
  Administered 2018-06-17: 70 mg via INTRAVENOUS

## 2018-06-17 MED ORDER — SUCCINYLCHOLINE CHLORIDE 200 MG/10ML IV SOSY
PREFILLED_SYRINGE | INTRAVENOUS | Status: DC | PRN
  Administered 2018-06-17: 100 mg via INTRAVENOUS

## 2018-06-17 MED ORDER — ONDANSETRON HCL 4 MG/2ML IJ SOLN: 4.0000 mg | Freq: Once | INTRAMUSCULAR | Status: DC | PRN

## 2018-06-17 MED ORDER — SODIUM CHLORIDE 0.9 % IV SOLN
INTRAVENOUS | Status: DC | PRN
  Administered 2018-06-17: 09:00:00 via INTRAVENOUS

## 2018-06-17 NOTE — Transfer of Care (Signed)
Immediate Anesthesia Transfer of Care Note  Patient: Jonathan ArDemetrio Leonhardt Jr.  Procedure(s) Performed: ECT TX  Patient Location: PACU  Anesthesia Type:General  Level of Consciousness: sedated  Airway & Oxygen Therapy: Patient Spontanous Breathing and Patient connected to face mask oxygen  Post-op Assessment: Report given to RN and Post -op Vital signs reviewed and stable  Post vital signs: Reviewed and stable  Last Vitals:  Vitals Value Taken Time  BP 134/83 06/17/2018 11:22 AM  Temp 36.6 C 06/17/2018 11:22 AM  Pulse 88 06/17/2018 11:24 AM  Resp 13 06/17/2018 11:24 AM  SpO2 100 % 06/17/2018 11:24 AM  Vitals shown include unvalidated device data.  Last Pain:  Vitals:   06/17/18 1122  TempSrc:   PainSc: 0-No pain         Complications: No apparent anesthesia complications

## 2018-06-17 NOTE — Anesthesia Post-op Follow-up Note (Signed)
Anesthesia QCDR form completed.        

## 2018-06-17 NOTE — Anesthesia Preprocedure Evaluation (Signed)
Anesthesia Evaluation  Patient identified by MRN, date of birth, ID band Patient awake    Reviewed: Allergy & Precautions, H&P , NPO status , Patient's Chart, lab work & pertinent test results, reviewed documented beta blocker date and time   Airway Mallampati: II   Neck ROM: full    Dental  (+) Poor Dentition   Pulmonary neg pulmonary ROS, Current Smoker,    Pulmonary exam normal        Cardiovascular Exercise Tolerance: Good hypertension, On Medications Normal cardiovascular exam Rhythm:regular Rate:Normal     Neuro/Psych PSYCHIATRIC DISORDERS Bipolar Disorder Schizophrenia negative neurological ROS     GI/Hepatic negative GI ROS, Neg liver ROS,   Endo/Other  negative endocrine ROS  Renal/GU negative Renal ROS  negative genitourinary   Musculoskeletal   Abdominal   Peds  Hematology negative hematology ROS (+)   Anesthesia Other Findings Past Medical History: No date: Schizophrenia (HCC) No date: Tachycardia History reviewed. No pertinent surgical history. BMI    Body Mass Index:  32.42 kg/m     Reproductive/Obstetrics negative OB ROS                             Anesthesia Physical Anesthesia Plan  ASA: II  Anesthesia Plan: General   Post-op Pain Management:    Induction:   PONV Risk Score and Plan:   Airway Management Planned:   Additional Equipment:   Intra-op Plan:   Post-operative Plan:   Informed Consent: I have reviewed the patients History and Physical, chart, labs and discussed the procedure including the risks, benefits and alternatives for the proposed anesthesia with the patient or authorized representative who has indicated his/her understanding and acceptance.   Dental Advisory Given  Plan Discussed with: CRNA  Anesthesia Plan Comments:         Anesthesia Quick Evaluation

## 2018-06-17 NOTE — Procedures (Signed)
ECT SERVICES Physician's Interval Evaluation & Treatment Note  Patient Identification: Geordan Horton MarshallSantana Jr. MRN:  [id number] Date of Evaluation:  06/17/2018 TX #: 27  MADRS:   MMSE:   P.E. Findings:  No change to physical exam stable.  Psychiatric Interval Note:  Mood is good stable no sign of psychosis  Subjective:  Patient is a 32 y.o. male seen for evaluation for Electroconvulsive Therapy. No specific complaint  Treatment Summary:   []   Right Unilateral             []  Bilateral   % Energy : 1.0 ms 100%   Impedance: 1260 ohms  Seizure Energy Index: 14,981 V squared  Postictal Suppression Index: 70%  Seizure Concordance Index: 97%  Medications  Pre Shock: Robinul 0.2 mg Brevital 70 mg succinylcholine 100 mg  Post Shock: Versed 1 mg  Seizure Duration: 26 seconds EMG 26 seconds EEG   Comments: Follow-up 5 weeks  Lungs:  [x]   Clear to auscultation               []  Other:   Heart:    [x]   Regular rhythm             []  irregular rhythm    [x]   Previous H&P reviewed, patient examined and there are NO CHANGES                 []   Previous H&P reviewed, patient examined and there are changes noted.   Mordecai RasmussenJohn Kolby Myung, MD 11/13/201911:06 AM

## 2018-06-17 NOTE — Anesthesia Procedure Notes (Signed)
Date/Time: 06/17/2018 11:14 AM Performed by: Lily KocherPeralta, Jniya Madara, CRNA Pre-anesthesia Checklist: Patient identified, Emergency Drugs available, Suction available and Patient being monitored Patient Re-evaluated:Patient Re-evaluated prior to induction Oxygen Delivery Method: Circle system utilized Preoxygenation: Pre-oxygenation with 100% oxygen Induction Type: IV induction Ventilation: Mask ventilation without difficulty and Mask ventilation throughout procedure Airway Equipment and Method: Bite block Placement Confirmation: positive ETCO2 Dental Injury: Teeth and Oropharynx as per pre-operative assessment

## 2018-06-17 NOTE — Discharge Instructions (Signed)
1)  The drugs that you have been given will stay in your system until tomorrow so for the       next 24 hours you should not:  A. Drive an automobile  B. Make any legal decisions  C. Drink any alcoholic beverages  2)  You may resume your regular meals upon return home.  3)  A responsible adult must take you home.  Someone should stay with you for a few          hours, then be available by phone for the remainder of the treatment day.  4)  You May experience any of the following symptoms:  Headache, Nausea and a dry mouth (due to the medications you were given),  temporary memory loss and some confusion, or sore muscles (a warm bath  should help this).  If you you experience any of these symptoms let us know on                your return visit.  5)  Report any of the following: any acute discomfort, severe headache, or temperature        greater than 100.5 F.   Also report any unusual redness, swelling, drainage, or pain         at your IV site.    You may report Symptoms to:  ECT PROGRAM- [place] [facility]          Phone: [phone], ECT Department           or Dr. Shary Keylapac's office [phone]  6)  Your next ECT Treatment is Wednesday December 18   We will call 2 days prior to your scheduled appointment for arrival times.  7)  Nothing to eat or drink after midnight the night before your procedure.  8)  Take morning meds    With a sip of water the morning of your procedure.  9)  Other Instructions: Call [phone] to cancel the morning of your procedure due         to illness or emergency.  10) We will call within 72 hours to assess how you are feeling.

## 2018-06-17 NOTE — H&P (Signed)
Jonathan Alexander MarshallSantana Jr. is an 32 y.o. male.   Chief Complaint: No specific new complaint.  Mood has been stable.  Physically doing well. HPI: Schizoaffective disorder with a history of good response to ECT and benefit from maintenance ECT  Past Medical History:  Diagnosis Date  . Schizophrenia (HCC)   . Tachycardia     History reviewed. No pertinent surgical history.  Family History  Family history unknown: Yes   Social History:  reports that he has been smoking cigarettes. He has smoked for the past 4.00 years. He has never used smokeless tobacco. He reports that he does not drink alcohol or use drugs.  Allergies:  Allergies  Allergen Reactions  . Divalproex Sodium   . Shellfish-Derived Products      (Not in a hospital admission)  No results found for this or any previous visit (from the past 48 hour(s)). No results found.  Review of Systems  Constitutional: Negative.   HENT: Negative.   Eyes: Negative.   Respiratory: Negative.   Cardiovascular: Negative.   Gastrointestinal: Negative.   Musculoskeletal: Negative.   Skin: Negative.   Neurological: Negative.   Psychiatric/Behavioral: Negative.     Blood pressure 129/73, pulse 85, temperature 98.2 F (36.8 C), temperature source Oral, height 5\' 7"  (1.702 m), weight 93.9 kg, SpO2 98 %. Physical Exam  Nursing note and vitals reviewed. Constitutional: He appears well-developed and well-nourished.  HENT:  Head: Normocephalic and atraumatic.  Eyes: Pupils are equal, round, and reactive to light. Conjunctivae are normal.  Neck: Normal range of motion.  Cardiovascular: Regular rhythm and normal heart sounds.  Respiratory: Effort normal.  GI: Soft.  Musculoskeletal: Normal range of motion.  Neurological: He is alert.  Skin: Skin is warm and dry.  Psychiatric: Judgment normal. His affect is blunt. His speech is delayed. He is slowed. Thought content is not paranoid. Cognition and memory are normal. He expresses no  homicidal and no suicidal ideation.     Assessment/Plan Patient is asking if we can start to spread out the ECT treatments I agreed to try going to 5 weeks this next time.  [name], MD 06/17/2018, 11:05 AM

## 2018-06-18 NOTE — Anesthesia Postprocedure Evaluation (Signed)
Anesthesia Post Note  Patient: Jonathan ArDemetrio Eisen Jr.  Procedure(s) Performed: ECT TX  Patient location during evaluation: PACU Anesthesia Type: General Level of consciousness: awake and alert Pain management: pain level controlled Vital Signs Assessment: post-procedure vital signs reviewed and stable Respiratory status: spontaneous breathing, nonlabored ventilation, respiratory function stable and patient connected to nasal cannula oxygen Cardiovascular status: blood pressure returned to baseline and stable Postop Assessment: no apparent nausea or vomiting Anesthetic complications: no     Last Vitals:  Vitals:   06/17/18 1148 06/17/18 1157  BP:  116/74  Pulse: (!) 109 97  Resp: 18 18  Temp: 36.7 C 36.8 C  SpO2: 100%     Last Pain:  Vitals:   06/17/18 1157  TempSrc: Oral  PainSc: 0-No pain                 Yevette EdwardsJames G Adams

## 2018-06-19 DIAGNOSIS — Z79899 Other long term (current) drug therapy: Secondary | ICD-10-CM | POA: Diagnosis not present

## 2018-06-26 DIAGNOSIS — Z79899 Other long term (current) drug therapy: Secondary | ICD-10-CM | POA: Diagnosis not present

## 2018-06-30 ENCOUNTER — Other Ambulatory Visit: Payer: Self-pay | Admitting: Psychiatry

## 2018-07-10 DIAGNOSIS — Z79899 Other long term (current) drug therapy: Secondary | ICD-10-CM | POA: Diagnosis not present

## 2018-07-13 ENCOUNTER — Telehealth: Payer: Self-pay

## 2018-07-15 ENCOUNTER — Encounter: Payer: Self-pay | Admitting: Anesthesiology

## 2018-07-15 ENCOUNTER — Telehealth: Payer: Self-pay | Admitting: *Deleted

## 2018-07-15 ENCOUNTER — Other Ambulatory Visit: Payer: Self-pay | Admitting: Psychiatry

## 2018-07-15 ENCOUNTER — Encounter
Admission: RE | Admit: 2018-07-15 | Discharge: 2018-07-15 | Disposition: A | Discharge status: Home / Self Care | Payer: Medicare Other | Source: Ambulatory Visit | Attending: Psychiatry | Admitting: Psychiatry

## 2018-07-15 DIAGNOSIS — F259 Schizoaffective disorder, unspecified: Secondary | ICD-10-CM | POA: Diagnosis not present

## 2018-07-15 DIAGNOSIS — F250 Schizoaffective disorder, bipolar type: Secondary | ICD-10-CM

## 2018-07-15 DIAGNOSIS — F17210 Nicotine dependence, cigarettes, uncomplicated: Secondary | ICD-10-CM | POA: Insufficient documentation

## 2018-07-15 DIAGNOSIS — F319 Bipolar disorder, unspecified: Secondary | ICD-10-CM | POA: Diagnosis not present

## 2018-07-15 MED ORDER — SODIUM CHLORIDE 0.9 % IV SOLN
INTRAVENOUS | Status: DC | PRN
  Administered 2018-07-15: 10:00:00 via INTRAVENOUS

## 2018-07-15 MED ORDER — MIDAZOLAM HCL 2 MG/2ML IJ SOLN
INTRAMUSCULAR | Status: AC
  Filled 2018-07-15: qty 2

## 2018-07-15 MED ORDER — MIDAZOLAM HCL 2 MG/2ML IJ SOLN
2.0000 mg | Freq: Once | INTRAMUSCULAR | Status: AC
  Administered 2018-07-15: 1 mg via INTRAVENOUS

## 2018-07-15 MED ORDER — GLYCOPYRROLATE 0.2 MG/ML IJ SOLN
INTRAMUSCULAR | Status: AC
  Administered 2018-07-15: 0.2 mg via INTRAVENOUS
  Filled 2018-07-15: qty 1

## 2018-07-15 MED ORDER — SODIUM CHLORIDE 0.9 % IV SOLN
500.0000 mL | Freq: Once | INTRAVENOUS | Status: AC
  Administered 2018-07-15: 500 mL via INTRAVENOUS

## 2018-07-15 MED ORDER — METHOHEXITAL SODIUM 100 MG/10ML IV SOSY
PREFILLED_SYRINGE | INTRAVENOUS | Status: DC | PRN
  Administered 2018-07-15: 70 mg via INTRAVENOUS

## 2018-07-15 MED ORDER — GLYCOPYRROLATE 0.2 MG/ML IJ SOLN
0.2000 mg | Freq: Once | INTRAMUSCULAR | Status: AC
  Administered 2018-07-15: 0.2 mg via INTRAVENOUS

## 2018-07-15 MED ORDER — SUCCINYLCHOLINE CHLORIDE 200 MG/10ML IV SOSY
PREFILLED_SYRINGE | INTRAVENOUS | Status: DC | PRN
  Administered 2018-07-15: 100 mg via INTRAVENOUS

## 2018-07-15 MED ORDER — ONDANSETRON HCL 4 MG/2ML IJ SOLN: 4.0000 mg | Freq: Once | INTRAMUSCULAR | Status: DC | PRN

## 2018-07-15 NOTE — H&P (Signed)
Jonathan Alexander MarshallSantana Jr. is an 32 y.o. male.   Chief Complaint: Patient himself did not have any complaint to me but he was clearly complaining of being more nervous and appears to be more hyper religious HPI: History of schizoaffective disorder  Past Medical History:  Diagnosis Date  . Schizophrenia (HCC)   . Tachycardia     History reviewed. No pertinent surgical history.  Family History  Family history unknown: Yes   Social History:  reports that he has been smoking cigarettes. He has smoked for the past 4.00 years. He has never used smokeless tobacco. He reports that he does not drink alcohol or use drugs.  Allergies:  Allergies  Allergen Reactions  . Divalproex Sodium   . Shellfish-Derived Products      (Not in a hospital admission)  No results found for this or any previous visit (from the past 48 hour(s)). No results found.  Review of Systems  Constitutional: Negative.   HENT: Negative.   Eyes: Negative.   Respiratory: Negative.   Cardiovascular: Negative.   Gastrointestinal: Negative.   Musculoskeletal: Negative.   Skin: Negative.   Neurological: Negative.   Psychiatric/Behavioral: Negative for depression, hallucinations, memory loss, substance abuse and suicidal ideas. The patient is nervous/anxious. The patient does not have insomnia.     Blood pressure 98/72, pulse 98, temperature 98.1 F (36.7 C), temperature source Oral, resp. rate 16, height 5\' 9"  (1.753 m), weight 91.6 kg, SpO2 98 %. Physical Exam  Nursing note and vitals reviewed. Constitutional: He appears well-developed and well-nourished.  HENT:  Head: Normocephalic and atraumatic.  Eyes: Pupils are equal, round, and reactive to light. Conjunctivae are normal.  Neck: Normal range of motion.  Cardiovascular: Regular rhythm and normal heart sounds.  Respiratory: Effort normal. No respiratory distress.  GI: Soft.  Musculoskeletal: Normal range of motion.  Neurological: He is alert.  Skin: Skin is  warm and dry.  Psychiatric: His affect is blunt. His speech is delayed. He is slowed. Thought content is delusional. Cognition and memory are impaired. He expresses impulsivity. He expresses no homicidal and no suicidal ideation.     Assessment/Plan Concerned about some of the hyper religious signs of possibly slipping into a mania we will do treatment today and also in 1 week because of the gap that would otherwise occur from the holiday  Mordecai RasmussenJohn Sadeel Fiddler, MD 07/15/2018, 12:56 PM

## 2018-07-15 NOTE — Discharge Instructions (Signed)
1)  The drugs that you have been given will stay in your system until tomorrow so for the       next 24 hours you should not:  A. Drive an automobile  B. Make any legal decisions  C. Drink any alcoholic beverages  2)  You may resume your regular meals upon return home.  3)  A responsible adult must take you home.  Someone should stay with you for a few          hours, then be available by phone for the remainder of the treatment day.  4)  You May experience any of the following symptoms:  Headache, Nausea and a dry mouth (due to the medications you were given),  temporary memory loss and some confusion, or sore muscles (a warm bath  should help this).  If you you experience any of these symptoms let us know on                your return visit.  5)  Report any of the following: any acute discomfort, severe headache, or temperature        greater than 100.5 F.   Also report any unusual redness, swelling, drainage, or pain         at your IV site.    You may report Symptoms to:  ECT PROGRAM- [place] [facility]          Phone: [phone], ECT Department           or Dr. Shary Keylapac's office [phone]  6)  Your next ECT Treatment is Day Wednesday  Date July 22, 2018  We will call 2 days prior to your scheduled appointment for arrival times.  7)  Nothing to eat or drink after midnight the night before your procedure.  8)  Take morning meds    With a sip of water the morning of your procedure.  9)  Other Instructions: Call [phone] to cancel the morning of your procedure due         to illness or emergency.  10) We will call within 72 hours to assess how you are feeling.

## 2018-07-15 NOTE — Anesthesia Preprocedure Evaluation (Signed)
Anesthesia Evaluation  Patient identified by MRN, date of birth, ID band Patient awake    Reviewed: Allergy & Precautions, H&P , NPO status , Patient's Chart, lab work & pertinent test results, reviewed documented beta blocker date and time   Airway Mallampati: II   Neck ROM: full    Dental  (+) Poor Dentition   Pulmonary neg pulmonary ROS, Current Smoker,    Pulmonary exam normal        Cardiovascular Exercise Tolerance: Good hypertension, Pt. on medications Normal cardiovascular exam Rhythm:regular Rate:Normal     Neuro/Psych PSYCHIATRIC DISORDERS Bipolar Disorder Schizophrenia negative neurological ROS     GI/Hepatic negative GI ROS, Neg liver ROS,   Endo/Other  negative endocrine ROS  Renal/GU negative Renal ROS  negative genitourinary   Musculoskeletal   Abdominal   Peds  Hematology negative hematology ROS (+)   Anesthesia Other Findings Past Medical History: No date: Schizophrenia (HCC) No date: Tachycardia History reviewed. No pertinent surgical history. BMI    Body Mass Index:  32.42 kg/m     Reproductive/Obstetrics negative OB ROS                             Anesthesia Physical  Anesthesia Plan  ASA: II  Anesthesia Plan: General   Post-op Pain Management:    Induction:   PONV Risk Score and Plan:   Airway Management Planned:   Additional Equipment:   Intra-op Plan:   Post-operative Plan:   Informed Consent: I have reviewed the patients History and Physical, chart, labs and discussed the procedure including the risks, benefits and alternatives for the proposed anesthesia with the patient or authorized representative who has indicated his/her understanding and acceptance.     Plan Discussed with: CRNA  Anesthesia Plan Comments:         Anesthesia Quick Evaluation

## 2018-07-15 NOTE — Anesthesia Post-op Follow-up Note (Signed)
Anesthesia QCDR form completed.        

## 2018-07-15 NOTE — Procedures (Signed)
ECT SERVICES Physician's Interval Evaluation & Treatment Note  Patient Identification: Jonathan Horton MarshallSantana Jr. MRN:  [id number] Date of Evaluation:  07/15/2018 TX #: 28  MADRS:   MMSE:   P.E. Findings:  No change to physical  Psychiatric Interval Note:  Patient is more hyper religious and somewhat anxious not threatening no sign of acute dangerousness  Subjective:  Patient is a 32 y.o. male seen for evaluation for Electroconvulsive Therapy. He has little insight into it but clearly is more anxious  Treatment Summary:    []   Right Unilateral             [x]  Bilateral   % Energy : 1.0 ms 100%   Impedance: 860 ohms  Seizure Energy Index: 11,949 V squared  Postictal Suppression Index: 90%  Seizure Concordance Index: 94%  Medications  Pre Shock: Robinul 0.2 mg Brevital 70 mg succinylcholine 100 mg  Post Shock: Versed 2 mg  Seizure Duration: 35 seconds by EMG 44 seconds by EEG   Comments: Follow-up in 1 week  Lungs:  [x]   Clear to auscultation               []  Other:   Heart:    [x]   Regular rhythm             []  irregular rhythm    [x]   Previous H&P reviewed, patient examined and there are NO CHANGES                 []   Previous H&P reviewed, patient examined and there are changes noted.   [name], MD 12/11/201912:57 PM

## 2018-07-15 NOTE — Transfer of Care (Signed)
Immediate Anesthesia Transfer of Care Note  Patient: Jonathan ArDemetrio Bajaj Jr.  Procedure(s) Performed: ECT TX  Patient Location: PACU  Anesthesia Type:General  Level of Consciousness: sedated  Airway & Oxygen Therapy: Patient Spontanous Breathing and Patient connected to face mask oxygen  Post-op Assessment: Report given to RN and Post -op Vital signs reviewed and stable  Post vital signs: Reviewed and stable  Last Vitals:  Vitals Value Taken Time  BP    Temp    Pulse    Resp    SpO2      Last Pain:  Vitals:   07/15/18 0933  TempSrc: Oral  PainSc: 0-No pain         Complications: No apparent anesthesia complications

## 2018-07-16 NOTE — Anesthesia Postprocedure Evaluation (Signed)
Anesthesia Post Note  Patient: Jonathan ArDemetrio Farra Jr.  Procedure(s) Performed: ECT TX  Patient location during evaluation: PACU Anesthesia Type: General Level of consciousness: awake and alert Pain management: pain level controlled Vital Signs Assessment: post-procedure vital signs reviewed and stable Respiratory status: spontaneous breathing, nonlabored ventilation and respiratory function stable Cardiovascular status: blood pressure returned to baseline and stable Postop Assessment: no signs of nausea or vomiting Anesthetic complications: no     Last Vitals:  Vitals:   07/15/18 1216 07/15/18 1230  BP: (!) 127/91 98/72  Pulse: 93 98  Resp: 15 16  Temp: 36.7 C 36.7 C  SpO2: 98%     Last Pain:  Vitals:   07/15/18 1230  TempSrc: Oral  PainSc: 0-No pain                 Korine Winton

## 2018-07-17 DIAGNOSIS — Z79899 Other long term (current) drug therapy: Secondary | ICD-10-CM | POA: Diagnosis not present

## 2018-07-20 ENCOUNTER — Telehealth: Payer: Self-pay

## 2018-07-24 DIAGNOSIS — Z79899 Other long term (current) drug therapy: Secondary | ICD-10-CM | POA: Diagnosis not present

## 2018-07-27 ENCOUNTER — Other Ambulatory Visit: Payer: Self-pay | Admitting: Psychiatry

## 2018-07-31 DIAGNOSIS — Z79899 Other long term (current) drug therapy: Secondary | ICD-10-CM | POA: Diagnosis not present

## 2018-08-03 ENCOUNTER — Telehealth: Payer: Self-pay

## 2018-08-10 ENCOUNTER — Telehealth: Payer: Self-pay

## 2018-08-14 ENCOUNTER — Encounter: Payer: Self-pay | Admitting: Anesthesiology

## 2018-08-14 ENCOUNTER — Other Ambulatory Visit: Payer: Self-pay | Admitting: Psychiatry

## 2018-08-14 ENCOUNTER — Ambulatory Visit
Admission: RE | Admit: 2018-08-14 | Discharge: 2018-08-14 | Disposition: A | Discharge status: Home / Self Care | Payer: Medicare Other | Source: Ambulatory Visit | Attending: Psychiatry | Admitting: Psychiatry

## 2018-08-14 DIAGNOSIS — F259 Schizoaffective disorder, unspecified: Secondary | ICD-10-CM | POA: Diagnosis not present

## 2018-08-14 DIAGNOSIS — F250 Schizoaffective disorder, bipolar type: Secondary | ICD-10-CM

## 2018-08-14 DIAGNOSIS — F17210 Nicotine dependence, cigarettes, uncomplicated: Secondary | ICD-10-CM | POA: Insufficient documentation

## 2018-08-14 MED ORDER — SODIUM CHLORIDE 0.9 % IV SOLN: 500.0000 mL | Freq: Once | INTRAVENOUS | Status: DC

## 2018-08-14 MED ORDER — GLYCOPYRROLATE 0.2 MG/ML IJ SOLN: 0.2000 mg | Freq: Once | INTRAMUSCULAR | Status: DC

## 2018-08-14 MED ORDER — SUCCINYLCHOLINE CHLORIDE 20 MG/ML IJ SOLN
INTRAMUSCULAR | Status: DC | PRN
  Administered 2018-08-14: 100 mg via INTRAVENOUS

## 2018-08-14 MED ORDER — SODIUM CHLORIDE 0.9 % IV SOLN
INTRAVENOUS | Status: DC | PRN
  Administered 2018-08-14: 11:00:00 via INTRAVENOUS

## 2018-08-14 MED ORDER — METHOHEXITAL SODIUM 100 MG/10ML IV SOSY
PREFILLED_SYRINGE | INTRAVENOUS | Status: DC | PRN
  Administered 2018-08-14: 70 mg via INTRAVENOUS

## 2018-08-14 MED ORDER — MIDAZOLAM HCL 2 MG/2ML IJ SOLN
INTRAMUSCULAR | Status: AC
  Filled 2018-08-14: qty 2

## 2018-08-14 MED ORDER — MIDAZOLAM HCL 2 MG/2ML IJ SOLN: 2.0000 mg | Freq: Once | INTRAMUSCULAR | Status: DC

## 2018-08-14 MED ORDER — ONDANSETRON HCL 4 MG/2ML IJ SOLN: 4.0000 mg | Freq: Once | INTRAMUSCULAR | Status: DC | PRN

## 2018-08-14 NOTE — Transfer of Care (Signed)
Immediate Anesthesia Transfer of Care Note  Patient: Jonathan ArDemetrio Ruda Jr.  Procedure(s) Performed: ECT TX  Patient Location: PACU  Anesthesia Type:General  Level of Consciousness: drowsy and patient cooperative  Airway & Oxygen Therapy: Patient Spontanous Breathing and Patient connected to face mask oxygen  Post-op Assessment: Report given to RN and Post -op Vital signs reviewed and stable  Post vital signs: Reviewed and stable  Last Vitals:  Vitals Value Taken Time  BP 135/100 08/14/2018 11:11 AM  Temp 37 C 08/14/2018 11:11 AM  Pulse 110 08/14/2018 11:11 AM  Resp 14 08/14/2018 11:11 AM  SpO2 100 % 08/14/2018 11:11 AM    Last Pain:  Vitals:   08/14/18 0904  TempSrc:   PainSc: 0-No pain         Complications: No apparent anesthesia complications

## 2018-08-14 NOTE — Anesthesia Postprocedure Evaluation (Signed)
Anesthesia Post Note  Patient: Jonathan ArDemetrio Magee Jr.  Procedure(s) Performed: ECT TX  Patient location during evaluation: PACU Anesthesia Type: General Level of consciousness: awake and alert Pain management: pain level controlled Vital Signs Assessment: post-procedure vital signs reviewed and stable Respiratory status: spontaneous breathing and respiratory function stable Cardiovascular status: stable Anesthetic complications: no     Last Vitals:  Vitals:   08/14/18 1131 08/14/18 1141  BP: 116/83 126/89  Pulse: (!) 108 (!) 103  Resp: 18 16  Temp:    SpO2: 98% 100%    Last Pain:  Vitals:   08/14/18 1141  TempSrc:   PainSc: 0-No pain                 KEPHART,WILLIAM K

## 2018-08-14 NOTE — Anesthesia Post-op Follow-up Note (Signed)
Anesthesia QCDR form completed.        

## 2018-08-14 NOTE — Procedures (Signed)
ECT SERVICES Physician's Interval Evaluation & Treatment Note  Patient Identification: Jonathan Alexander. MRN:  [id number] Date of Evaluation:  08/14/2018 TX #: 29  MADRS:   MMSE:   P.E. Findings:  No change to physical  Psychiatric Interval Note:  Slightly euphoric but not inappropriate  Subjective:  Patient is a 33 y.o. male seen for evaluation for Electroconvulsive Therapy. No specific complaints  Treatment Summary:   []   Right Unilateral             [x]  Bilateral   % Energy : 1.0 ms 100%   Impedance: 1170 ohms  Seizure Energy Index: 23,597 V squared  Postictal Suppression Index: 92%  Seizure Concordance Index: 97%  Medications  Pre Shock: Robinul 0.2 mg Brevital 70 mg succinylcholine 100 mg  Post Shock: Versed 2 mg  Seizure Duration: 17 seconds by motor movement 35 seconds by EEG   Comments: Follow-up in 4 weeks  Lungs:  [x]   Clear to auscultation               []  Other:   Heart:    [x]   Regular rhythm             []  irregular rhythm    [x]   Previous H&P reviewed, patient examined and there are NO CHANGES                 []   Previous H&P reviewed, patient examined and there are changes noted.   [name], MD 1/10/202010:58 AM

## 2018-08-14 NOTE — Anesthesia Preprocedure Evaluation (Signed)
Anesthesia Evaluation  Patient identified by MRN, date of birth, ID band Patient awake    Reviewed: Allergy & Precautions, H&P , NPO status , Patient's Chart, lab work & pertinent test results, reviewed documented beta blocker date and time   Airway Mallampati: II   Neck ROM: full    Dental  (+) Poor Dentition   Pulmonary neg pulmonary ROS, Current Smoker,    Pulmonary exam normal        Cardiovascular Exercise Tolerance: Good hypertension, Pt. on medications Normal cardiovascular exam Rhythm:regular Rate:Normal     Neuro/Psych PSYCHIATRIC DISORDERS Bipolar Disorder Schizophrenia negative neurological ROS     GI/Hepatic Neg liver ROS, neg GERD  ,  Endo/Other  neg diabetes  Renal/GU negative Renal ROS  negative genitourinary   Musculoskeletal   Abdominal   Peds  Hematology   Anesthesia Other Findings     Reproductive/Obstetrics negative OB ROS                             Anesthesia Physical  Anesthesia Plan  ASA: II  Anesthesia Plan: General   Post-op Pain Management:    Induction:   PONV Risk Score and Plan:   Airway Management Planned:   Additional Equipment:   Intra-op Plan:   Post-operative Plan:   Informed Consent: I have reviewed the patients History and Physical, chart, labs and discussed the procedure including the risks, benefits and alternatives for the proposed anesthesia with the patient or authorized representative who has indicated his/her understanding and acceptance.     Plan Discussed with: CRNA  Anesthesia Plan Comments:         Anesthesia Quick Evaluation

## 2018-08-14 NOTE — Discharge Instructions (Signed)
1)  The drugs that you have been given will stay in your system until tomorrow so for the       next 24 hours you should not:  A. Drive an automobile  B. Make any legal decisions  C. Drink any alcoholic beverages  2)  You may resume your regular meals upon return home.  3)  A responsible adult must take you home.  Someone should stay with you for a few          hours, then be available by phone for the remainder of the treatment day.  4)  You May experience any of the following symptoms:  Headache, Nausea and a dry mouth (due to the medications you were given),  temporary memory loss and some confusion, or sore muscles (a warm bath  should help this).  If you you experience any of these symptoms let us know on                your return visit.  5)  Report any of the following: any acute discomfort, severe headache, or temperature        greater than 100.5 F.   Also report any unusual redness, swelling, drainage, or pain         at your IV site.    You may report Symptoms to:  ECT PROGRAM- [place] at [facility]          Phone: [phone], ECT Department           or Dr. Shary Key office [phone]  6)  Your next ECT Treatment is Friday February 7  We will call 2 days prior to your scheduled appointment for arrival times.  7)  Nothing to eat or drink after midnight the night before your procedure.  8)  Take morning meds  with a sip of water the morning of your procedure.  9)  Other Instructions: Call [phone] to cancel the morning of your procedure due         to illness or emergency.  10) We will call within 72 hours to assess how you are feeling.

## 2018-08-14 NOTE — H&P (Signed)
Jonathan Alexander. is an 33 y.o. male.   Chief Complaint: No complaint HPI: Schizoaffective disorder with maintenance ECT appears to be stable  Past Medical History:  Diagnosis Date  . Schizophrenia (HCC)   . Tachycardia     History reviewed. No pertinent surgical history.  Family History  Family history unknown: Yes   Social History:  reports that he has been smoking cigarettes. He has smoked for the past 4.00 years. He has never used smokeless tobacco. He reports that he does not drink alcohol or use drugs.  Allergies:  Allergies  Allergen Reactions  . Divalproex Sodium   . Shellfish-Derived Products     (Not in a hospital admission)   No results found for this or any previous visit (from the past 48 hour(s)). No results found.  Review of Systems  Constitutional: Negative.   HENT: Negative.   Eyes: Negative.   Respiratory: Negative.   Cardiovascular: Negative.   Gastrointestinal: Negative.   Musculoskeletal: Negative.   Skin: Negative.   Neurological: Negative.   Psychiatric/Behavioral: Negative for depression, hallucinations, memory loss, substance abuse and suicidal ideas. The patient is not nervous/anxious and does not have insomnia.     Blood pressure 130/84, pulse (!) 108, temperature 97.9 F (36.6 C), temperature source Oral, resp. rate 18, SpO2 99 %. Physical Exam  Nursing note and vitals reviewed. Constitutional: He appears well-developed and well-nourished.  HENT:  Head: Normocephalic and atraumatic.  Eyes: Pupils are equal, round, and reactive to light. Conjunctivae are normal.  Neck: Normal range of motion.  Cardiovascular: Regular rhythm and normal heart sounds.  Respiratory: Effort normal.  GI: Soft.  Musculoskeletal: Normal range of motion.  Neurological: He is alert.  Skin: Skin is warm and dry.  Psychiatric: He has a normal mood and affect. His behavior is normal. Judgment and thought content normal.     Assessment/Plan Treatment today  as usual and follow-up in 4 weeks  [name], MD 08/14/2018, 10:57 AM

## 2018-09-07 ENCOUNTER — Telehealth: Payer: Self-pay

## 2018-09-09 ENCOUNTER — Telehealth: Payer: Self-pay

## 2018-09-11 ENCOUNTER — Encounter: Payer: Self-pay | Admitting: Anesthesiology

## 2018-09-11 ENCOUNTER — Encounter
Admission: RE | Admit: 2018-09-11 | Discharge: 2018-09-11 | Disposition: A | Discharge status: Home / Self Care | Payer: Medicare Other | Source: Ambulatory Visit | Attending: Psychiatry | Admitting: Psychiatry

## 2018-09-11 ENCOUNTER — Other Ambulatory Visit: Payer: Self-pay | Admitting: Psychiatry

## 2018-09-11 DIAGNOSIS — F259 Schizoaffective disorder, unspecified: Secondary | ICD-10-CM | POA: Diagnosis present

## 2018-09-11 DIAGNOSIS — F250 Schizoaffective disorder, bipolar type: Secondary | ICD-10-CM | POA: Diagnosis not present

## 2018-09-11 DIAGNOSIS — F17210 Nicotine dependence, cigarettes, uncomplicated: Secondary | ICD-10-CM | POA: Diagnosis not present

## 2018-09-11 MED ORDER — METHOHEXITAL SODIUM 100 MG/10ML IV SOSY
PREFILLED_SYRINGE | INTRAVENOUS | Status: DC | PRN
  Administered 2018-09-11: 70 mg via INTRAVENOUS

## 2018-09-11 MED ORDER — METHOHEXITAL SODIUM 0.5 G IJ SOLR
INTRAMUSCULAR | Status: AC
  Filled 2018-09-11: qty 500

## 2018-09-11 MED ORDER — GLYCOPYRROLATE 0.2 MG/ML IJ SOLN
INTRAMUSCULAR | Status: AC
  Filled 2018-09-11: qty 1

## 2018-09-11 MED ORDER — MIDAZOLAM HCL 2 MG/2ML IJ SOLN
INTRAMUSCULAR | Status: DC | PRN
  Administered 2018-09-11: 1 mg via INTRAVENOUS

## 2018-09-11 MED ORDER — GLYCOPYRROLATE 0.2 MG/ML IJ SOLN
0.1000 mg | Freq: Once | INTRAMUSCULAR | Status: AC
  Administered 2018-09-11: 0.1 mg via INTRAVENOUS

## 2018-09-11 MED ORDER — MIDAZOLAM HCL 2 MG/2ML IJ SOLN
INTRAMUSCULAR | Status: AC
  Filled 2018-09-11: qty 2

## 2018-09-11 MED ORDER — SODIUM CHLORIDE 0.9 % IV SOLN
500.0000 mL | Freq: Once | INTRAVENOUS | Status: AC
  Administered 2018-09-11: 500 mL via INTRAVENOUS

## 2018-09-11 MED ORDER — SODIUM CHLORIDE 0.9 % IV SOLN
INTRAVENOUS | Status: DC | PRN
  Administered 2018-09-11: 10:00:00 via INTRAVENOUS

## 2018-09-11 MED ORDER — SUCCINYLCHOLINE CHLORIDE 20 MG/ML IJ SOLN
INTRAMUSCULAR | Status: DC | PRN
  Administered 2018-09-11: 100 mg via INTRAVENOUS

## 2018-09-11 NOTE — Anesthesia Procedure Notes (Signed)
Performed by: Amreen Raczkowski, CRNA Pre-anesthesia Checklist: Patient identified, Emergency Drugs available, Suction available and Patient being monitored Patient Re-evaluated:Patient Re-evaluated prior to induction Oxygen Delivery Method: Circle system utilized Preoxygenation: Pre-oxygenation with 100% oxygen Induction Type: IV induction Ventilation: Mask ventilation without difficulty and Mask ventilation throughout procedure Airway Equipment and Method: Bite block Placement Confirmation: positive ETCO2 Dental Injury: Teeth and Oropharynx as per pre-operative assessment        

## 2018-09-11 NOTE — Anesthesia Preprocedure Evaluation (Signed)
Anesthesia Evaluation  Patient identified by MRN, date of birth, ID band Patient awake    Reviewed: Allergy & Precautions, H&P , NPO status , Patient's Chart, lab work & pertinent test results, reviewed documented beta blocker date and time   Airway Mallampati: II   Neck ROM: full    Dental  (+) Poor Dentition   Pulmonary neg pulmonary ROS, Current Smoker,    Pulmonary exam normal        Cardiovascular Exercise Tolerance: Good hypertension, Pt. on medications Normal cardiovascular exam Rhythm:regular Rate:Normal     Neuro/Psych PSYCHIATRIC DISORDERS Bipolar Disorder Schizophrenia negative neurological ROS     GI/Hepatic Neg liver ROS, neg GERD  ,  Endo/Other  neg diabetes  Renal/GU negative Renal ROS  negative genitourinary   Musculoskeletal   Abdominal   Peds  Hematology   Anesthesia Other Findings     Reproductive/Obstetrics negative OB ROS                             Anesthesia Physical  Anesthesia Plan  ASA: II  Anesthesia Plan: General   Post-op Pain Management:    Induction:   PONV Risk Score and Plan:   Airway Management Planned:   Additional Equipment:   Intra-op Plan:   Post-operative Plan:   Informed Consent: I have reviewed the patients History and Physical, chart, labs and discussed the procedure including the risks, benefits and alternatives for the proposed anesthesia with the patient or authorized representative who has indicated his/her understanding and acceptance.     Plan Discussed with: CRNA  Anesthesia Plan Comments:         Anesthesia Quick Evaluation  

## 2018-09-11 NOTE — Anesthesia Post-op Follow-up Note (Signed)
Anesthesia QCDR form completed.        

## 2018-09-11 NOTE — Procedures (Signed)
ECT SERVICES Physician's Interval Evaluation & Treatment Note  Patient Identification: Jonathan Horton MarshallSantana Jr. MRN:  [id number] Date of Evaluation:  09/11/2018 TX #: 30  MADRS:   MMSE:   P.E. Findings:  Physical exam unremarkable heart and lungs normal vitals normal  Psychiatric Interval Note:  No complaints no outward symptoms  Subjective:  Patient is a 33 y.o. male seen for evaluation for Electroconvulsive Therapy. No complaints  Treatment Summary:   []   Right Unilateral             [x]  Bilateral   % Energy : 1.0 ms 100%   Impedance: 1920 ohms  Seizure Energy Index: 33,446 V squared  Postictal Suppression Index: 90%  Seizure Concordance Index: 97%  Medications  Pre Shock: Robinul 0.2 mg Brevital 70 mg succinylcholine 100 mg  Post Shock: Versed 1 mg  Seizure Duration: 35 seconds EMG 35 seconds EEG   Comments: Follow-up in about 5 weeks  Lungs:  [x]   Clear to auscultation               []  Other:   Heart:    [x]   Regular rhythm             []  irregular rhythm    [x]   Previous H&P reviewed, patient examined and there are NO CHANGES                 []   Previous H&P reviewed, patient examined and there are changes noted.   Jonathan RasmussenJohn Warrene Kapfer, MD 2/7/202010:31 AM

## 2018-09-11 NOTE — H&P (Signed)
Jonathan Alexander. is an 33 y.o. male.   Chief Complaint: Patient has no specific complaint HPI: Chronic schizoaffective disorder  Past Medical History:  Diagnosis Date  . Schizophrenia (HCC)   . Tachycardia     History reviewed. No pertinent surgical history.  Family History  Family history unknown: Yes   Social History:  reports that he has been smoking cigarettes. He has smoked for the past 4.00 years. He has never used smokeless tobacco. He reports that he does not drink alcohol or use drugs.  Allergies:  Allergies  Allergen Reactions  . Divalproex Sodium   . Shellfish-Derived Products     (Not in a hospital admission)   No results found for this or any previous visit (from the past 48 hour(s)). No results found.  Review of Systems  Constitutional: Negative.   HENT: Negative.   Eyes: Negative.   Respiratory: Negative.   Cardiovascular: Negative.   Gastrointestinal: Negative.   Musculoskeletal: Negative.   Skin: Negative.   Neurological: Negative.   Psychiatric/Behavioral: Negative.     Blood pressure 126/81, pulse 98, temperature 99 F (37.2 C), temperature source Oral, resp. rate 16, height 5\' 9"  (1.753 m), weight 98.4 kg, SpO2 100 %. Physical Exam  Constitutional: He appears well-developed and well-nourished.  HENT:  Head: Normocephalic and atraumatic.  Eyes: Pupils are equal, round, and reactive to light. Conjunctivae are normal.  Neck: Normal range of motion.  Cardiovascular: Normal heart sounds.  Respiratory: Effort normal.  GI: Soft.  Musculoskeletal: Normal range of motion.  Neurological: He is alert.  Skin: Skin is warm and dry.  Psychiatric: Judgment normal. His affect is blunt. His speech is delayed. He is slowed. Cognition and memory are normal. He expresses no homicidal and no suicidal ideation. He exhibits normal recent memory.     Assessment/Plan ECT today and return on regular schedule in about 4 to 5 weeks  [name],  MD 09/11/2018, 10:30 AM

## 2018-09-11 NOTE — Discharge Instructions (Signed)
1)  The drugs that you have been given will stay in your system until tomorrow so for the       next 24 hours you should not:  A. Drive an automobile  B. Make any legal decisions  C. Drink any alcoholic beverages  2)  You may resume your regular meals upon return home.  3)  A responsible adult must take you home.  Someone should stay with you for a few          hours, then be available by phone for the remainder of the treatment day.  4)  You May experience any of the following symptoms:  Headache, Nausea and a dry mouth (due to the medications you were given),  temporary memory loss and some confusion, or sore muscles (a warm bath  should help this).  If you you experience any of these symptoms let us know on                your return visit.  5)  Report any of the following: any acute discomfort, severe headache, or temperature        greater than 100.5 F.   Also report any unusual redness, swelling, drainage, or pain         at your IV site.    You may report Symptoms to:  ECT PROGRAM-  at [facility]          Phone: [phone], ECT Department           or Dr. Shary Key office [phone]  6)  Your next ECT Treatment is Day Wednesday  Date October 14, 2018  We will call 2 days prior to your scheduled appointment for arrival times.  7)  Nothing to eat or drink after midnight the night before your procedure.  8)  Take all morning meds    With a sip of water the morning of your procedure.  9)  Other Instructions: Call [phone] to cancel the morning of your procedure due         to illness or emergency.  10) We will call within 72 hours to assess how you are feeling.

## 2018-09-11 NOTE — Anesthesia Postprocedure Evaluation (Signed)
Anesthesia Post Note  Patient: Jonathan ArDemetrio Roediger Jr.  Procedure(s) Performed: ECT TX  Patient location during evaluation: PACU Anesthesia Type: General Level of consciousness: awake and alert Pain management: pain level controlled Vital Signs Assessment: post-procedure vital signs reviewed and stable Respiratory status: spontaneous breathing, nonlabored ventilation, respiratory function stable and patient connected to nasal cannula oxygen Cardiovascular status: blood pressure returned to baseline and stable Postop Assessment: no apparent nausea or vomiting Anesthetic complications: no     Last Vitals:  Vitals:   09/11/18 1129 09/11/18 1135  BP: (!) 125/98   Pulse: 79   Resp: 18 14  Temp: 37.2 C   SpO2:      Last Pain:  Vitals:   09/11/18 1129  TempSrc: Oral  PainSc: 0-No pain                 Lenard SimmerAndrew Merlon Alcorta

## 2018-09-11 NOTE — Transfer of Care (Signed)
Immediate Anesthesia Transfer of Care Note  Patient: Jonathan Alexander.  Procedure(s) Performed: ECT TX  Patient Location: PACU  Anesthesia Type:General  Level of Consciousness: drowsy  Airway & Oxygen Therapy: Patient Spontanous Breathing and Patient connected to face mask oxygen  Post-op Assessment: Report given to RN and Post -op Vital signs reviewed and stable  Post vital signs: Reviewed and stable  Last Vitals:  Vitals Value Taken Time  BP 136/95 09/11/2018 10:46 AM  Temp 37.3 C 09/11/2018 10:45 AM  Pulse 96 09/11/2018 10:48 AM  Resp 14 09/11/2018 10:48 AM  SpO2 100 % 09/11/2018 10:48 AM  Vitals shown include unvalidated device data.  Last Pain:  Vitals:   09/11/18 1045  TempSrc:   PainSc: Asleep           Complications: No apparent anesthesia complications

## 2018-10-13 ENCOUNTER — Other Ambulatory Visit: Payer: Self-pay | Admitting: Psychiatry

## 2018-10-14 ENCOUNTER — Ambulatory Visit
Admission: RE | Admit: 2018-10-14 | Discharge: 2018-10-14 | Disposition: A | Discharge status: Home / Self Care | Payer: Medicare Other | Source: Ambulatory Visit | Attending: Psychiatry | Admitting: Psychiatry

## 2018-10-14 ENCOUNTER — Other Ambulatory Visit: Payer: Self-pay

## 2018-10-14 ENCOUNTER — Encounter: Payer: Self-pay | Admitting: Anesthesiology

## 2018-10-14 DIAGNOSIS — F329 Major depressive disorder, single episode, unspecified: Secondary | ICD-10-CM | POA: Insufficient documentation

## 2018-10-14 DIAGNOSIS — F250 Schizoaffective disorder, bipolar type: Secondary | ICD-10-CM

## 2018-10-14 MED ORDER — SODIUM CHLORIDE 0.9 % IV SOLN
INTRAVENOUS | Status: DC | PRN
  Administered 2018-10-14: 10:00:00 via INTRAVENOUS

## 2018-10-14 MED ORDER — SUCCINYLCHOLINE CHLORIDE 200 MG/10ML IV SOSY
PREFILLED_SYRINGE | INTRAVENOUS | Status: DC | PRN
  Administered 2018-10-14: 100 mg via INTRAVENOUS

## 2018-10-14 MED ORDER — METHOHEXITAL SODIUM 100 MG/10ML IV SOSY
PREFILLED_SYRINGE | INTRAVENOUS | Status: DC | PRN
  Administered 2018-10-14: 70 mg via INTRAVENOUS

## 2018-10-14 MED ORDER — SODIUM CHLORIDE 0.9 % IV SOLN
500.0000 mL | Freq: Once | INTRAVENOUS | Status: AC
  Administered 2018-10-14: 500 mL via INTRAVENOUS

## 2018-10-14 MED ORDER — MIDAZOLAM HCL 2 MG/2ML IJ SOLN
INTRAMUSCULAR | Status: AC
  Filled 2018-10-14: qty 2

## 2018-10-14 MED ORDER — MIDAZOLAM HCL 2 MG/2ML IJ SOLN
INTRAMUSCULAR | Status: DC | PRN
  Administered 2018-10-14: 1 mg via INTRAVENOUS

## 2018-10-14 MED ORDER — METHOHEXITAL SODIUM 0.5 G IJ SOLR
INTRAMUSCULAR | Status: AC
  Filled 2018-10-14: qty 500

## 2018-10-14 MED ORDER — SUCCINYLCHOLINE CHLORIDE 20 MG/ML IJ SOLN
INTRAMUSCULAR | Status: AC
  Filled 2018-10-14: qty 1

## 2018-10-14 NOTE — Anesthesia Preprocedure Evaluation (Signed)
Anesthesia Evaluation  Patient identified by MRN, date of birth, ID band Patient awake    Reviewed: Allergy & Precautions, NPO status , Patient's Chart, lab work & pertinent test results, reviewed documented beta blocker date and time   History of Anesthesia Complications Negative for: history of anesthetic complications  Airway Mallampati: II  TM Distance: >3 FB     Dental  (+) Chipped   Pulmonary neg sleep apnea, neg COPD, Current Smoker, former smoker,    breath sounds clear to auscultation- rhonchi (-) wheezing      Cardiovascular Exercise Tolerance: Good hypertension, (-) CAD, (-) Past MI and (-) Cardiac Stents  Rhythm:Regular Rate:Normal - Systolic murmurs and - Diastolic murmurs    Neuro/Psych PSYCHIATRIC DISORDERS Bipolar Disorder Schizophrenia negative neurological ROS     GI/Hepatic negative GI ROS, Neg liver ROS,   Endo/Other  diabetes, Oral Hypoglycemic Agents  Renal/GU negative Renal ROS     Musculoskeletal negative musculoskeletal ROS (+)   Abdominal (+) + obese,   Peds  Hematology negative hematology ROS (+)   Anesthesia Other Findings Past Medical History: No date: Schizophrenia (HCC) No date: Tachycardia   Reproductive/Obstetrics                             Anesthesia Physical  Anesthesia Plan  ASA: III  Anesthesia Plan: General   Post-op Pain Management:    Induction: Intravenous  PONV Risk Score and Plan: 2 and Ondansetron  Airway Management Planned: Mask  Additional Equipment:   Intra-op Plan:   Post-operative Plan:   Informed Consent: I have reviewed the patients History and Physical, chart, labs and discussed the procedure including the risks, benefits and alternatives for the proposed anesthesia with the patient or authorized representative who has indicated his/her understanding and acceptance.       Plan Discussed with: CRNA and  Anesthesiologist  Anesthesia Plan Comments:         Anesthesia Quick Evaluation  

## 2018-10-14 NOTE — Anesthesia Post-op Follow-up Note (Signed)
Anesthesia QCDR form completed.        

## 2018-10-14 NOTE — Discharge Instructions (Signed)
1)  The drugs that you have been given will stay in your system until tomorrow so for the       next 24 hours you should not:  A. Drive an automobile  B. Make any legal decisions  C. Drink any alcoholic beverages  2)  You may resume your regular meals upon return home.  3)  A responsible adult must take you home.  Someone should stay with you for a few          hours, then be available by phone for the remainder of the treatment day.  4)  You May experience any of the following symptoms:  Headache, Nausea and a dry mouth (due to the medications you were given),  temporary memory loss and some confusion, or sore muscles (a warm bath  should help this).  If you you experience any of these symptoms let us know on                your return visit.  5)  Report any of the following: any acute discomfort, severe headache, or temperature        greater than 100.5 F.   Also report any unusual redness, swelling, drainage, or pain         at your IV site.    You may report Symptoms to:  ECT PROGRAM- [place] at [facility]          Phone: [phone], ECT Department           or Dr. Shary Key office [phone]  6)  Your next ECT Treatment is Wednesday April 15   We will call 2 days prior to your scheduled appointment for arrival times.  7)  Nothing to eat or drink after midnight the night before your procedure.  8)  Take morning meds  With a sip of water the morning of your procedure.  9)  Other Instructions: Call [phone] to cancel the morning of your procedure due         to illness or emergency.  10) We will call within 72 hours to assess how you are feeling.

## 2018-10-14 NOTE — Anesthesia Procedure Notes (Signed)
Date/Time: 10/14/2018 11:23 AM Performed by: Lily Kocher, CRNA Pre-anesthesia Checklist: Patient identified, Emergency Drugs available, Suction available and Patient being monitored Patient Re-evaluated:Patient Re-evaluated prior to induction Oxygen Delivery Method: Circle system utilized Preoxygenation: Pre-oxygenation with 100% oxygen Induction Type: IV induction Ventilation: Mask ventilation without difficulty and Mask ventilation throughout procedure Airway Equipment and Method: Bite block Placement Confirmation: positive ETCO2 Dental Injury: Teeth and Oropharynx as per pre-operative assessment

## 2018-10-14 NOTE — Procedures (Signed)
ECT SERVICES Physician's Interval Evaluation & Treatment Note  Patient Identification: Jonathan Alexander. MRN:  [id number] Date of Evaluation:  10/14/2018 TX #: 31  MADRS:   MMSE:   P.E. Findings:  No change to physical  Psychiatric Interval Note:  Mood seems stable and normal  Subjective:  Patient is a 33 y.o. male seen for evaluation for Electroconvulsive Therapy. No new complaint  Treatment Summary:   []   Right Unilateral             [x]  Bilateral   % Energy : 1.0 ms 100%   Impedance: 1550 ohms  Seizure Energy Index: 14,635 V squared   Postictal Suppression Index: 75%  Seizure Concordance Index: 97%  Medications  Pre Shock: Robinul 0.2 mg Brevital 70 mg succinylcholine 110 mg  Post Shock: Versed 2 mg  Seizure Duration: EMG 29 seconds EEG 33 seconds   Comments: Follow-up 5 weeks  Lungs:  [x]   Clear to auscultation               []  Other:   Heart:    [x]   Regular rhythm             []  irregular rhythm    [x]   Previous H&P reviewed, patient examined and there are NO CHANGES                 []   Previous H&P reviewed, patient examined and there are changes noted.   [name], MD 3/11/202011:18 AM

## 2018-10-14 NOTE — Transfer of Care (Signed)
Immediate Anesthesia Transfer of Care Note  Patient: Jonathan Alexander.  Procedure(s) Performed: ECT TX  Patient Location: PACU  Anesthesia Type:General  Level of Consciousness: sedated  Airway & Oxygen Therapy: Patient Spontanous Breathing and Patient connected to face mask oxygen  Post-op Assessment: Report given to RN and Post -op Vital signs reviewed and stable  Post vital signs: Reviewed and stable  Last Vitals:  Vitals Value Taken Time  BP 115/92 10/14/2018 11:33 AM  Temp 36.2 C 10/14/2018 11:33 AM  Pulse 109 10/14/2018 11:35 AM  Resp 17 10/14/2018 11:35 AM  SpO2 100 % 10/14/2018 11:35 AM  Vitals shown include unvalidated device data.  Last Pain:  Vitals:   10/14/18 1005  TempSrc:   PainSc: 0-No pain         Complications: No apparent anesthesia complications

## 2018-10-15 NOTE — Anesthesia Postprocedure Evaluation (Signed)
Anesthesia Post Note  Patient: [name].  Procedure(s) Performed: ECT TX  Patient location during evaluation: PACU Anesthesia Type: General Level of consciousness: awake and alert Pain management: pain level controlled Vital Signs Assessment: post-procedure vital signs reviewed and stable Respiratory status: spontaneous breathing, nonlabored ventilation and respiratory function stable Cardiovascular status: blood pressure returned to baseline and stable Postop Assessment: no signs of nausea or vomiting Anesthetic complications: no     Last Vitals:  Vitals:   10/14/18 1203 10/14/18 1212  BP: 113/79 128/87  Pulse: (!) 103 (!) 102  Resp: 12 18  Temp: (!) 36.3 C   SpO2: 99%     Last Pain:  Vitals:   10/14/18 1212  TempSrc:   PainSc: 0-No pain                 Leelynn Whetsel

## 2018-10-26 ENCOUNTER — Other Ambulatory Visit: Payer: Self-pay | Admitting: Psychiatry

## 2018-11-16 ENCOUNTER — Telehealth: Payer: Self-pay

## 2018-11-18 ENCOUNTER — Other Ambulatory Visit: Payer: Self-pay | Admitting: Psychiatry

## 2018-11-23 ENCOUNTER — Inpatient Hospital Stay: Admission: RE | Admit: 2018-11-23 | Payer: Self-pay | Source: Ambulatory Visit

## 2018-11-23 ENCOUNTER — Telehealth: Payer: Self-pay

## 2018-11-23 ENCOUNTER — Other Ambulatory Visit: Payer: Self-pay | Admitting: Psychiatry

## 2018-11-24 ENCOUNTER — Other Ambulatory Visit: Payer: Self-pay | Admitting: Psychiatry

## 2018-11-25 ENCOUNTER — Other Ambulatory Visit: Payer: Self-pay

## 2018-11-25 ENCOUNTER — Encounter: Payer: Self-pay | Admitting: Anesthesiology

## 2018-11-25 ENCOUNTER — Encounter
Admission: RE | Admit: 2018-11-25 | Discharge: 2018-11-25 | Disposition: A | Discharge status: Home / Self Care | Payer: Medicare Other | Source: Ambulatory Visit | Attending: Psychiatry | Admitting: Psychiatry

## 2018-11-25 DIAGNOSIS — F332 Major depressive disorder, recurrent severe without psychotic features: Secondary | ICD-10-CM

## 2018-11-25 DIAGNOSIS — F17210 Nicotine dependence, cigarettes, uncomplicated: Secondary | ICD-10-CM | POA: Diagnosis not present

## 2018-11-25 DIAGNOSIS — F209 Schizophrenia, unspecified: Secondary | ICD-10-CM | POA: Insufficient documentation

## 2018-11-25 MED ORDER — MIDAZOLAM HCL 2 MG/2ML IJ SOLN
2.0000 mg | Freq: Once | INTRAMUSCULAR | Status: AC
  Administered 2018-11-25: 1 mg via INTRAVENOUS

## 2018-11-25 MED ORDER — METHOHEXITAL SODIUM 100 MG/10ML IV SOSY
PREFILLED_SYRINGE | INTRAVENOUS | Status: DC | PRN
  Administered 2018-11-25: 70 mg via INTRAVENOUS

## 2018-11-25 MED ORDER — SUCCINYLCHOLINE CHLORIDE 20 MG/ML IJ SOLN
INTRAMUSCULAR | Status: AC
  Filled 2018-11-25: qty 1

## 2018-11-25 MED ORDER — SODIUM CHLORIDE 0.9 % IV SOLN
500.0000 mL | Freq: Once | INTRAVENOUS | Status: AC
  Administered 2018-11-25: 11:00:00 via INTRAVENOUS

## 2018-11-25 MED ORDER — GLYCOPYRROLATE 0.2 MG/ML IJ SOLN: 0.2000 mg | Freq: Once | INTRAMUSCULAR | Status: DC

## 2018-11-25 MED ORDER — MIDAZOLAM HCL 2 MG/2ML IJ SOLN
INTRAMUSCULAR | Status: AC
  Filled 2018-11-25: qty 2

## 2018-11-25 MED ORDER — SUCCINYLCHOLINE CHLORIDE 200 MG/10ML IV SOSY
PREFILLED_SYRINGE | INTRAVENOUS | Status: DC | PRN
  Administered 2018-11-25: 100 mg via INTRAVENOUS

## 2018-11-25 MED ORDER — SODIUM CHLORIDE 0.9 % IV SOLN
500.0000 mL | Freq: Once | INTRAVENOUS | Status: AC
  Administered 2018-11-25: 500 mL via INTRAVENOUS

## 2018-11-25 NOTE — Anesthesia Postprocedure Evaluation (Signed)
Anesthesia Post Note  Patient: Jonathan Alexander.  Procedure(s) Performed: ECT TX  Patient location during evaluation: PACU Anesthesia Type: General Level of consciousness: awake and alert Pain management: pain level controlled Vital Signs Assessment: post-procedure vital signs reviewed and stable Respiratory status: spontaneous breathing, nonlabored ventilation, respiratory function stable and patient connected to nasal cannula oxygen Cardiovascular status: blood pressure returned to baseline and stable Postop Assessment: no apparent nausea or vomiting Anesthetic complications: no     Last Vitals:  Vitals:   11/25/18 1122 11/25/18 1134  BP: 114/83 115/85  Pulse: (!) 115 100  Resp: 20 16  Temp: (!) 36.4 C (!) 36.4 C  SpO2: 96%     Last Pain:  Vitals:   11/25/18 1134  TempSrc: Oral  PainSc: 0-No pain                 Shelonda Saxe S

## 2018-11-25 NOTE — Anesthesia Procedure Notes (Signed)
Date/Time: 11/25/2018 10:39 AM Performed by: Lily Kocher, CRNA Pre-anesthesia Checklist: Patient identified, Emergency Drugs available, Suction available and Patient being monitored Patient Re-evaluated:Patient Re-evaluated prior to induction Oxygen Delivery Method: Circle system utilized Preoxygenation: Pre-oxygenation with 100% oxygen Induction Type: IV induction Ventilation: Mask ventilation without difficulty and Mask ventilation throughout procedure Airway Equipment and Method: Bite block Placement Confirmation: positive ETCO2 Dental Injury: Teeth and Oropharynx as per pre-operative assessment

## 2018-11-25 NOTE — H&P (Signed)
Jonathan Alexander. is an 33 y.o. male.   Chief Complaint: no chief complaint HPI: stable with good response to maintenance ect   Past Medical History:  Diagnosis Date  . Schizophrenia (HCC)   . Tachycardia     History reviewed. No pertinent surgical history.  Family History  Family history unknown: Yes   Social History:  reports that he has been smoking cigarettes. He has smoked for the past 4.00 years. He has never used smokeless tobacco. He reports that he does not drink alcohol or use drugs.  Allergies:  Allergies  Allergen Reactions  . Divalproex Sodium   . Shellfish-Derived Products     (Not in a hospital admission)   No results found for this or any previous visit (from the past 48 hour(s)). No results found.  Review of Systems  Constitutional: Negative.   HENT: Negative.   Eyes: Negative.   Respiratory: Negative.   Cardiovascular: Negative.   Gastrointestinal: Negative.   Musculoskeletal: Negative.   Skin: Negative.   Neurological: Negative.   Psychiatric/Behavioral: Negative.     Blood pressure 126/86, pulse (!) 103, temperature 98.8 F (37.1 C), temperature source Oral, resp. rate 16, height 5\' 7"  (1.702 m), SpO2 98 %. Physical Exam  Nursing note and vitals reviewed. Constitutional: He appears well-developed and well-nourished.  HENT:  Head: Normocephalic and atraumatic.  Eyes: Pupils are equal, round, and reactive to light. Conjunctivae are normal.  Neck: Normal range of motion.  Cardiovascular: Regular rhythm and normal heart sounds.  Respiratory: Effort normal.  GI: Soft.  Musculoskeletal: Normal range of motion.  Neurological: He is alert.  Skin: Skin is warm and dry.  Psychiatric: He has a normal mood and affect. His behavior is normal. Judgment and thought content normal.     Assessment/Plan Continue q 2-3 week maintainence  [name], MD 11/25/2018, 9:50 AM

## 2018-11-25 NOTE — Anesthesia Postprocedure Evaluation (Signed)
Anesthesia Post Note  Patient: Jonathan Deman Jr.  Procedure(s) Performed: ECT TX  Patient location during evaluation: PACU Anesthesia Type: General Level of consciousness: awake and alert Pain management: pain level controlled Vital Signs Assessment: post-procedure vital signs reviewed and stable Respiratory status: spontaneous breathing, nonlabored ventilation, respiratory function stable and patient connected to nasal cannula oxygen Cardiovascular status: blood pressure returned to baseline and stable Postop Assessment: no apparent nausea or vomiting Anesthetic complications: no     Last Vitals:  Vitals:   11/25/18 1122 11/25/18 1134  BP: 114/83 115/85  Pulse: (!) 115 100  Resp: 20 16  Temp: (!) 36.4 C (!) 36.4 C  SpO2: 96%     Last Pain:  Vitals:   11/25/18 1134  TempSrc: Oral  PainSc: 0-No pain                 Ravenna Legore S     

## 2018-11-25 NOTE — Anesthesia Post-op Follow-up Note (Signed)
Anesthesia QCDR form completed.        

## 2018-11-25 NOTE — Anesthesia Preprocedure Evaluation (Signed)
Anesthesia Evaluation  Patient identified by MRN, date of birth, ID band Patient awake    Reviewed: Allergy & Precautions, NPO status , Patient's Chart, lab work & pertinent test results, reviewed documented beta blocker date and time   Airway Mallampati: II  TM Distance: >3 FB     Dental  (+) Chipped   Pulmonary Current Smoker,           Cardiovascular hypertension, Pt. on medications      Neuro/Psych PSYCHIATRIC DISORDERS Bipolar Disorder Schizophrenia    GI/Hepatic   Endo/Other    Renal/GU      Musculoskeletal   Abdominal   Peds  Hematology   Anesthesia Other Findings   Reproductive/Obstetrics                             Anesthesia Physical Anesthesia Plan  ASA: II  Anesthesia Plan: General   Post-op Pain Management:    Induction: Intravenous  PONV Risk Score and Plan:   Airway Management Planned:   Additional Equipment:   Intra-op Plan:   Post-operative Plan:   Informed Consent: I have reviewed the patients History and Physical, chart, labs and discussed the procedure including the risks, benefits and alternatives for the proposed anesthesia with the patient or authorized representative who has indicated his/her understanding and acceptance.       Plan Discussed with: CRNA  Anesthesia Plan Comments:         Anesthesia Quick Evaluation

## 2018-11-25 NOTE — Discharge Instructions (Addendum)
1)  The drugs that you have been given will stay in your system until tomorrow so for the       next 24 hours you should not:  A. Drive an automobile  B. Make any legal decisions  C. Drink any alcoholic beverages  2)  You may resume your regular meals upon return home.  3)  A responsible adult must take you home.  Someone should stay with you for a few          hours, then be available by phone for the remainder of the treatment day.  4)  You May experience any of the following symptoms:  Headache, Nausea and a dry mouth (due to the medications you were given),  temporary memory loss and some confusion, or sore muscles (a warm bath  should help this).  If you you experience any of these symptoms let us know on                your return visit.  5)  Report any of the following: any acute discomfort, severe headache, or temperature        greater than 100.5 F.   Also report any unusual redness, swelling, drainage, or pain         at your IV site.    You may report Symptoms to:  ECT PROGRAM- [place] at [facility]          Phone: [phone], ECT Department           or Dr. Shary Key office [phone]  6)  Your next ECT Treatment is June 3   We will call 2 days prior to your scheduled appointment for arrival times.  7)  Nothing to eat or drink after midnight the night before your procedure.  8)  Take morning medicine with a sip of water the morning of your procedure.  9)  Other Instructions: Call [phone] to cancel the morning of your procedure due         to illness or emergency.  10) We will call within 72 hours to assess how you are feeling.

## 2018-11-25 NOTE — Procedures (Signed)
ECT SERVICES Physician's Interval Evaluation & Treatment Note  Patient Identification: Seymour Horton Marshall. MRN:  [id number] Date of Evaluation:  11/25/2018 TX #: 32  MADRS:   MMSE:   P.E. Findings:  No change to physical exam  Psychiatric Interval Note:  Mood good and stable  Subjective:  Patient is a 33 y.o. male seen for evaluation for Electroconvulsive Therapy. No specific complaints  Treatment Summary:   []   Right Unilateral             [x]  Bilateral   % Energy : 1.0 ms 100%   Impedance: 1570 ohms  Seizure Energy Index: 15,454 V squared  Postictal Suppression Index: 89%  Seizure Concordance Index: 98%  Medications  Pre Shock: Robinul 0.2 mg Brevital 70 mg succinylcholine 100 mg  Post Shock: Versed 2 mg  Seizure Duration: 26 seconds EMG 26 seconds EEG   Comments: Follow-up 2 weeks  Lungs:  [x]   Clear to auscultation               []  Other:   Heart:    [x]   Regular rhythm             []  irregular rhythm    [x]   Previous H&P reviewed, patient examined and there are NO CHANGES                 []   Previous H&P reviewed, patient examined and there are changes noted.   [name], MD 4/22/202010:31 AM

## 2018-11-25 NOTE — Transfer of Care (Signed)
Immediate Anesthesia Transfer of Care Note  Patient: Jonathan Alexander.  Procedure(s) Performed: ECT TX  Patient Location: PACU  Anesthesia Type:General  Level of Consciousness: sedated  Airway & Oxygen Therapy: Patient Spontanous Breathing and Patient connected to face mask oxygen  Post-op Assessment: Report given to RN and Post -op Vital signs reviewed and stable  Post vital signs: Reviewed and stable  Last Vitals:  Vitals Value Taken Time  BP 125/75 11/25/2018 10:49 AM  Temp    Pulse 109 11/25/2018 10:51 AM  Resp 21 11/25/2018 10:51 AM  SpO2 100 % 11/25/2018 10:51 AM  Vitals shown include unvalidated device data.  Last Pain:  Vitals:   11/25/18 0852  TempSrc: Oral  PainSc: 0-No pain         Complications: No apparent anesthesia complications

## 2019-01-03 ENCOUNTER — Other Ambulatory Visit: Payer: Self-pay | Admitting: Psychiatry

## 2019-01-04 ENCOUNTER — Encounter: Payer: Self-pay | Admitting: Anesthesiology

## 2019-01-04 ENCOUNTER — Ambulatory Visit
Admission: RE | Admit: 2019-01-04 | Discharge: 2019-01-04 | Disposition: A | Discharge status: Home / Self Care | Payer: Medicare Other | Source: Ambulatory Visit | Attending: Psychiatry | Admitting: Psychiatry

## 2019-01-04 ENCOUNTER — Other Ambulatory Visit: Payer: Self-pay

## 2019-01-04 DIAGNOSIS — F250 Schizoaffective disorder, bipolar type: Secondary | ICD-10-CM

## 2019-01-04 DIAGNOSIS — F329 Major depressive disorder, single episode, unspecified: Secondary | ICD-10-CM | POA: Insufficient documentation

## 2019-01-04 MED ORDER — SODIUM CHLORIDE 0.9 % IV SOLN
500.0000 mL | Freq: Once | INTRAVENOUS | Status: AC
  Administered 2019-01-04: 11:00:00 via INTRAVENOUS

## 2019-01-04 MED ORDER — METHOHEXITAL SODIUM 100 MG/10ML IV SOSY
PREFILLED_SYRINGE | INTRAVENOUS | Status: DC | PRN
  Administered 2019-01-04: 70 mg via INTRAVENOUS

## 2019-01-04 MED ORDER — ONDANSETRON HCL 4 MG/2ML IJ SOLN: 4.0000 mg | Freq: Once | INTRAMUSCULAR | Status: DC | PRN

## 2019-01-04 MED ORDER — GLYCOPYRROLATE 0.2 MG/ML IJ SOLN: 0.1000 mg | Freq: Once | INTRAMUSCULAR | Status: DC

## 2019-01-04 MED ORDER — FENTANYL CITRATE (PF) 100 MCG/2ML IJ SOLN: 25.0000 ug | INTRAMUSCULAR | Status: DC | PRN

## 2019-01-04 MED ORDER — MIDAZOLAM HCL 2 MG/2ML IJ SOLN
INTRAMUSCULAR | Status: AC
  Filled 2019-01-04: qty 2

## 2019-01-04 MED ORDER — SUCCINYLCHOLINE CHLORIDE 200 MG/10ML IV SOSY
PREFILLED_SYRINGE | INTRAVENOUS | Status: DC | PRN
  Administered 2019-01-04: 100 mg via INTRAVENOUS

## 2019-01-04 MED ORDER — MIDAZOLAM HCL 2 MG/2ML IJ SOLN
2.0000 mg | Freq: Once | INTRAMUSCULAR | Status: AC
  Administered 2019-01-04: 1 mg via INTRAVENOUS

## 2019-01-04 MED ORDER — SUCCINYLCHOLINE CHLORIDE 20 MG/ML IJ SOLN
INTRAMUSCULAR | Status: AC
  Filled 2019-01-04: qty 1

## 2019-01-04 NOTE — Discharge Instructions (Signed)
1)  The drugs that you have been given will stay in your system until tomorrow so for the       next 24 hours you should not:  A. Drive an automobile  B. Make any legal decisions  C. Drink any alcoholic beverages  2)  You may resume your regular meals upon return home.  3)  A responsible adult must take you home.  Someone should stay with you for a few          hours, then be available by phone for the remainder of the treatment day.  4)  You May experience any of the following symptoms:  Headache, Nausea and a dry mouth (due to the medications you were given),  temporary memory loss and some confusion, or sore muscles (a warm bath  should help this).  If you you experience any of these symptoms let us know on                your return visit.  5)  Report any of the following: any acute discomfort, severe headache, or temperature        greater than 100.5 F.   Also report any unusual redness, swelling, drainage, or pain         at your IV site.    You may report Symptoms to:  ECT PROGRAM- [place] at [facility]          Phone: [phone], ECT Department           or Dr. Shary Key office [phone]  6)  Your next ECT Treatment is Wednesday July 6   We will call 2 days prior to your scheduled appointment for arrival times.  7)  Nothing to eat or drink after midnight the night before your procedure.  8)  Take     With a sip of water the morning of your procedure.  9)  Other Instructions: Call [phone] to cancel the morning of your procedure due         to illness or emergency.  10) We will call within 72 hours to assess how you are feeling.

## 2019-01-04 NOTE — Anesthesia Postprocedure Evaluation (Signed)
Anesthesia Post Note  Patient: Jonathan Alexander.  Procedure(s) Performed: ECT TX  Patient location during evaluation: PACU Anesthesia Type: General Level of consciousness: awake and alert Pain management: pain level controlled Vital Signs Assessment: post-procedure vital signs reviewed and stable Respiratory status: spontaneous breathing, nonlabored ventilation, respiratory function stable and patient connected to nasal cannula oxygen Cardiovascular status: blood pressure returned to baseline and stable Postop Assessment: no apparent nausea or vomiting Anesthetic complications: no     Last Vitals:  Vitals:   01/04/19 1138 01/04/19 1148  BP: 110/77 114/78  Pulse: (!) 102 (!) 103  Resp: 20 15  Temp:    SpO2: 100% 98%    Last Pain:  Vitals:   01/04/19 1148  TempSrc:   PainSc: 0-No pain                 Elke Holtry S

## 2019-01-04 NOTE — Anesthesia Post-op Follow-up Note (Signed)
Anesthesia QCDR form completed.        

## 2019-01-04 NOTE — Anesthesia Preprocedure Evaluation (Signed)
Anesthesia Evaluation  Patient identified by MRN, date of birth, ID band Patient awake    Reviewed: Allergy & Precautions, NPO status , Patient's Chart, lab work & pertinent test results, reviewed documented beta blocker date and time   Airway Mallampati: II  TM Distance: >3 FB     Dental  (+) Chipped   Pulmonary Current Smoker,           Cardiovascular hypertension,      Neuro/Psych PSYCHIATRIC DISORDERS Bipolar Disorder Schizophrenia    GI/Hepatic   Endo/Other    Renal/GU      Musculoskeletal   Abdominal   Peds  Hematology   Anesthesia Other Findings   Reproductive/Obstetrics                             Anesthesia Physical Anesthesia Plan  ASA: II  Anesthesia Plan: General   Post-op Pain Management:    Induction: Intravenous  PONV Risk Score and Plan:   Airway Management Planned:   Additional Equipment:   Intra-op Plan:   Post-operative Plan:   Informed Consent: I have reviewed the patients History and Physical, chart, labs and discussed the procedure including the risks, benefits and alternatives for the proposed anesthesia with the patient or authorized representative who has indicated his/her understanding and acceptance.       Plan Discussed with: CRNA  Anesthesia Plan Comments:         Anesthesia Quick Evaluation

## 2019-01-04 NOTE — Transfer of Care (Signed)
Immediate Anesthesia Transfer of Care Note  Patient: Jonathan Alexander.  Procedure(s) Performed: ECT TX  Patient Location: PACU  Anesthesia Type:General  Level of Consciousness: sedated  Airway & Oxygen Therapy: Patient Spontanous Breathing and Patient connected to face mask oxygen  Post-op Assessment: Report given to RN and Post -op Vital signs reviewed and stable  Post vital signs: Reviewed and stable  Last Vitals:  Vitals Value Taken Time  BP 111/80 01/04/2019 11:28 AM  Temp    Pulse 106 01/04/2019 11:29 AM  Resp 19 01/04/2019 11:29 AM  SpO2 100 % 01/04/2019 11:29 AM  Vitals shown include unvalidated device data.  Last Pain:  Vitals:   01/04/19 0927  TempSrc: Oral         Complications: No apparent anesthesia complications

## 2019-01-04 NOTE — Procedures (Signed)
ECT SERVICES Physician's Interval Evaluation & Treatment Note  Patient Identification: Jonathan Alexander. MRN:  [id number] Date of Evaluation:  01/04/2019 TX #: [id number]  MADRS:   MMSE:   P.E. Findings:  No change to physical exam  Psychiatric Interval Note:  Upbeat.  Almost euphoric a little bit not inappropriate however not psychotic.  Subjective:  Patient is a 33 y.o. male seen for evaluation for Electroconvulsive Therapy. No complaints  Treatment Summary:   []   Right Unilateral             [x]  Bilateral   % Energy : 1.0 ms 100%   Impedance: 1390 ohms  Seizure Energy Index: 14,800 V squared  Postictal Suppression Index: No meaningful reading because 1 of the electrodes came loose  Seizure Concordance Index: Also no meaningful reading because the electrodes came loose  Medications  Pre Shock: Robinul 0.2 mg Brevital 70 mg succinylcholine 100 mg  Post Shock: Versed 2 mg  Seizure Duration: 26 seconds by EMG 26 seconds by EEG   Comments: Seems to be doing pretty well.  We will see him back in 5 weeks.  Lungs:  [x]   Clear to auscultation               []  Other:   Heart:    [x]   Regular rhythm             []  irregular rhythm    [x]   Previous H&P reviewed, patient examined and there are NO CHANGES                 []   Previous H&P reviewed, patient examined and there are changes noted.   Jonathan Rasmussen, MD 6/1/202011:09 AM

## 2019-01-04 NOTE — H&P (Signed)
Jonathan Taheim Fitt. is an 33 y.o. male.   Chief Complaint: no complaint Schizoaffective disorder. Maintenance ect very helpful in stability  HPI: see above  Past Medical History:  Diagnosis Date  . Schizophrenia (HCC)   . Tachycardia     History reviewed. No pertinent surgical history.  Family History  Family history unknown: Yes   Social History:  reports that he has been smoking cigarettes. He has smoked for the past 4.00 years. He has never used smokeless tobacco. He reports that he does not drink alcohol or use drugs.  Allergies:  Allergies  Allergen Reactions  . Divalproex Sodium   . Shellfish-Derived Products     (Not in a hospital admission)   No results found for this or any previous visit (from the past 48 hour(s)). No results found.  Review of Systems  Constitutional: Negative.   HENT: Negative.   Eyes: Negative.   Respiratory: Negative.   Cardiovascular: Negative.   Gastrointestinal: Negative.   Musculoskeletal: Negative.   Skin: Negative.   Neurological: Negative.   Psychiatric/Behavioral: Negative.     Blood pressure 132/85, pulse 96, temperature 98 F (36.7 C), temperature source Oral, resp. rate 18, SpO2 98 %. Physical Exam  Nursing note and vitals reviewed. Constitutional: He appears well-developed and well-nourished.  HENT:  Head: Normocephalic and atraumatic.  Eyes: Pupils are equal, round, and reactive to light. Conjunctivae are normal.  Neck: Normal range of motion.  Cardiovascular: Regular rhythm and normal heart sounds.  Respiratory: Effort normal.  GI: Soft.  Musculoskeletal: Normal range of motion.  Neurological: He is alert.  Skin: Skin is warm and dry.  Psychiatric: He has a normal mood and affect. His behavior is normal. Judgment and thought content normal.     Assessment/Plan bilat tx today follow up per nml schedule  [name], MD 01/04/2019, 9:48 AM

## 2019-01-12 ENCOUNTER — Encounter: Payer: Self-pay | Admitting: *Deleted

## 2019-01-12 ENCOUNTER — Other Ambulatory Visit: Payer: Self-pay

## 2019-01-12 ENCOUNTER — Emergency Department
Admission: EM | Admit: 2019-01-12 | Discharge: 2019-01-13 | Disposition: A | Discharge status: Other Acute Inpatient Hospital | Payer: Medicare Other | Attending: Emergency Medicine | Admitting: Emergency Medicine

## 2019-01-12 DIAGNOSIS — Z20828 Contact with and (suspected) exposure to other viral communicable diseases: Secondary | ICD-10-CM | POA: Insufficient documentation

## 2019-01-12 DIAGNOSIS — F17210 Nicotine dependence, cigarettes, uncomplicated: Secondary | ICD-10-CM | POA: Insufficient documentation

## 2019-01-12 DIAGNOSIS — Z79899 Other long term (current) drug therapy: Secondary | ICD-10-CM | POA: Insufficient documentation

## 2019-01-12 DIAGNOSIS — F209 Schizophrenia, unspecified: Secondary | ICD-10-CM | POA: Diagnosis present

## 2019-01-12 DIAGNOSIS — R451 Restlessness and agitation: Secondary | ICD-10-CM

## 2019-01-12 LAB — COMPREHENSIVE METABOLIC PANEL
ALT: 17 U/L (ref 0–44)
AST: 17 U/L (ref 15–41)
Albumin: 4.4 g/dL (ref 3.5–5.0)
Alkaline Phosphatase: 98 U/L (ref 38–126)
Anion gap: 10 (ref 5–15)
BUN: 13 mg/dL (ref 6–20)
CO2: 24 mmol/L (ref 22–32)
Calcium: 9.6 mg/dL (ref 8.9–10.3)
Chloride: 103 mmol/L (ref 98–111)
Creatinine, Ser: 1.12 mg/dL (ref 0.61–1.24)
GFR calc Af Amer: >60 mL/min (ref >60)
GFR calc non Af Amer: >60 mL/min (ref >60)
Glucose, Bld: 105 mg/dL — ABNORMAL HIGH (ref 70–99)
Potassium: 4.2 mmol/L (ref 3.5–5.1)
Sodium: 137 mmol/L (ref 135–145)
Total Bilirubin: 0.4 mg/dL (ref 0.3–1.2)
Total Protein: 7.2 g/dL (ref 6.5–8.1)

## 2019-01-12 LAB — CBC
HCT: 42 % (ref 39.0–52.0)
Hemoglobin: 13.9 g/dL (ref 13.0–17.0)
MCH: 31.5 pg (ref 26.0–34.0)
MCHC: 33.1 g/dL (ref 30.0–36.0)
MCV: 95.2 fL (ref 80.0–100.0)
Platelets: 287 10*3/uL (ref 150–400)
RBC: 4.41 MIL/uL (ref 4.22–5.81)
RDW: 12.4 % (ref 11.5–15.5)
WBC: 11.7 10*3/uL — ABNORMAL HIGH (ref 4.0–10.5)
nRBC: 0 % (ref 0.0–0.2)

## 2019-01-12 LAB — URINE DRUG SCREEN, QUALITATIVE (ARMC ONLY)
Amphetamines, Ur Screen: NOT DETECTED
Barbiturates, Ur Screen: NOT DETECTED
Benzodiazepine, Ur Scrn: NOT DETECTED
Cannabinoid 50 Ng, Ur ~~LOC~~: NOT DETECTED
Cocaine Metabolite,Ur ~~LOC~~: NOT DETECTED
MDMA (Ecstasy)Ur Screen: NOT DETECTED
Methadone Scn, Ur: NOT DETECTED
Opiate, Ur Screen: NOT DETECTED
Phencyclidine (PCP) Ur S: NOT DETECTED
Tricyclic, Ur Screen: POSITIVE — AB

## 2019-01-12 LAB — ACETAMINOPHEN LEVEL: Acetaminophen (Tylenol), Serum: <10 ug/mL — ABNORMAL LOW (ref 10–30)

## 2019-01-12 LAB — LITHIUM LEVEL: Lithium Lvl: 0.52 mmol/L — ABNORMAL LOW (ref 0.60–1.20)

## 2019-01-12 LAB — ETHANOL: Alcohol, Ethyl (B): <10 mg/dL (ref <10)

## 2019-01-12 LAB — SALICYLATE LEVEL: Salicylate Lvl: <7 mg/dL (ref 2.8–30.0)

## 2019-01-12 MED ORDER — OLANZAPINE 5 MG PO TBDP
15.0000 mg | ORAL_TABLET | ORAL | Status: AC
  Administered 2019-01-12: 20:00:00 15 mg via ORAL
  Filled 2019-01-12 (×2): qty 3

## 2019-01-12 MED ORDER — LORAZEPAM 2 MG PO TABS
2.0000 mg | ORAL_TABLET | Freq: Once | ORAL | Status: AC
  Administered 2019-01-12: 19:00:00 2 mg via ORAL
  Filled 2019-01-12: qty 1

## 2019-01-12 NOTE — ED Provider Notes (Signed)
[facility] Emergency Department Provider Note   ____________________________________________   First MD Initiated Contact with Patient 01/12/19 1913     (approximate)  I have reviewed the triage vital signs and the nursing notes.   HISTORY  Chief Complaint Behavior Problem    HPI Jonathan Alexander. is a 33 y.o. male who presents voluntarily with the sheriff for concerns of agitation.  Patient reports that he does not feel like he is doing well.  He reports he is felt very anxious needs something to "calm him down".  Felt he needed to come to the hospital.  Has a history of psychiatric problems and has received treatment here many times.  Patient reports symptoms are coming back.  He takes medication but seems to be getting worse.  Denies wanting to harm himself but does make statements toward staff here that he wants to hurt someone but is not having a specific person that he intends to hurt     Past Medical History:  Diagnosis Date  . Schizophrenia ([place])   . Tachycardia     Patient Active Problem List   Diagnosis Date Noted  . Schizoaffective disorder, bipolar type ([place]) 02/09/2018  . Hypertension 02/09/2018    History reviewed. No pertinent surgical history.  Prior to Admission medications   Medication Sig Start Date End Date Taking? Authorizing Provider  clonazePAM (KLONOPIN) 1 MG tablet TAKE ONE TABLET BY MOUTH AT BEDTIME 07/15/18  Yes Clapacs, Madie Reno, MD  clozapine (CLOZARIL) 200 MG tablet TAKE ONE TABLET (200MG ) BY MOUTH EVERY MORNING TAKE 4 TABS (800MG ) BYMOUTH AT BEDTIME. 07/31/18  Yes Clapacs, Madie Reno, MD  desmopressin (DDAVP) 0.2 MG tablet Take 1 tablet (0.2 mg total) by mouth at bedtime. 02/13/18  Yes Clapacs, Madie Reno, MD  ferrous sulfate 325 (65 FE) MG tablet Take 1 tablet (325 mg total) by mouth daily with breakfast. 02/13/18  Yes Clapacs, Madie Reno, MD  imipramine (TOFRANIL) 50 MG tablet TAKE 3 TABLETS BY MOUTH AT BEDTIME 07/31/18  Yes  Clapacs, Madie Reno, MD  loratadine (CLARITIN) 10 MG tablet Take 1 tablet (10 mg total) by mouth daily. 02/13/18  Yes Clapacs, Madie Reno, MD  metFORMIN (GLUCOPHAGE) 500 MG tablet Take 1 tablet (500 mg total) by mouth 2 (two) times daily with a meal. 02/13/18  Yes Clapacs, Madie Reno, MD  metoprolol succinate (TOPROL-XL) 100 MG 24 hr tablet Take 150 mg by mouth daily. 01/01/19  Yes [provider]  OLANZapine (ZYPREXA) 15 MG tablet Take 1 tablet (15 mg total) by mouth at bedtime. 02/13/18  Yes Clapacs, Madie Reno, MD  polyethylene glycol powder (GLYCOLAX/MIRALAX) powder MIX 17GM INTO 4-8 OUNCES OF FLUID, THEN TAKE BY MOUTH EVERY MORNING 05/05/18  Yes Clapacs, Madie Reno, MD  EPINEPHrine (EPIPEN 2-PAK) 0.3 mg/0.3 mL IJ SOAJ injection Inject 0.3 mg into the muscle as directed.    [provider]  lithium carbonate (LITHOBID) 300 MG CR tablet TAKE TWO TABLETS BY MOUTH AT BEDTIME Patient not taking: Reported on 01/12/2019 11/19/18   Clapacs, Madie Reno, MD  venlafaxine XR (EFFEXOR-XR) 75 MG 24 hr capsule TAKE 3 CAPSULES (225MG ) BY MOUTH EVERY DAY WITH BREAKFAST *DO NOT CRUSH* Patient not taking: Reported on 01/12/2019 11/19/18   Clapacs, Madie Reno, MD    Allergies Divalproex sodium and Shellfish-derived products  Family History  Family history unknown: Yes    Social History Social History   Tobacco Use  . Smoking status: Current Every Day Smoker    Years:  4.00    Types: Cigarettes  . Smokeless tobacco: Never Used  Substance Use Topics  . Alcohol use: No  . Drug use: No    Review of Systems Constitutional: No fever/chills Eyes: No visual changes. Cardiovascular: Denies chest pain. Respiratory: Denies shortness of breath. Gastrointestinal: No abdominal pain.   Musculoskeletal: Negative for back pain. Skin: Negative for rash. Neurological: Negative for headaches, areas of focal weakness or numbness.    ____________________________________________   PHYSICAL EXAM:  VITAL SIGNS: ED Triage  Vitals  Enc Vitals Group     BP 01/12/19 1835 (!) 139/95     Pulse Rate 01/12/19 1835 (!) 106     Resp 01/12/19 1835 19     Temp 01/12/19 1835 99.3 F (37.4 C)     Temp Source 01/12/19 1835 Oral     SpO2 01/12/19 1835 98 %     Weight 01/12/19 1834 220 lb (99.8 kg)     Height 01/12/19 1834 5\' 8"  (1.727 m)     Head Circumference --      Peak Flow --      Pain Score 01/12/19 1843 0     Pain Loc --      Pain Edu? --      Excl. in GC? --     Constitutional: Alert and oriented.  Appears anxious, pacing back and forth states he feels anxious.  Eyes: Conjunctivae are normal. Head: Atraumatic. Nose: No congestion/rhinnorhea. Mouth/Throat: Mucous membranes are moist. Neck: No stridor.  Cardiovascular: Normal rate, regular rhythm. Grossly normal heart sounds.  Good peripheral circulation. Respiratory: Normal respiratory effort.  No retractions. Lungs CTAB. Gastrointestinal: Soft and nontender. No distention. Musculoskeletal: No lower extremity tenderness nor edema. Neurologic:  Normal speech and language. No gross focal neurologic deficits are appreciated.  Skin:  Skin is warm, dry and intact. No rash noted. Psychiatric: Mood and affect are normal. Speech and behavior are normal.  ____________________________________________   LABS (all labs ordered are listed, but only abnormal results are displayed)  Labs Reviewed  COMPREHENSIVE METABOLIC PANEL - Abnormal; Notable for the following components:      Result Value   Glucose, Bld 105 (*)    All other components within normal limits  ACETAMINOPHEN LEVEL - Abnormal; Notable for the following components:   Acetaminophen (Tylenol), Serum <10 (*)    All other components within normal limits  CBC - Abnormal; Notable for the following components:   WBC 11.7 (*)    All other components within normal limits  URINE DRUG SCREEN, QUALITATIVE ([facility] ONLY) - Abnormal; Notable for the following components:   Tricyclic, Ur Screen POSITIVE (*)     All other components within normal limits  LITHIUM LEVEL - Abnormal; Notable for the following components:   Lithium Lvl 0.52 (*)    All other components within normal limits  ETHANOL  SALICYLATE LEVEL   ____________________________________________  EKG   ____________________________________________  RADIOLOGY   ____________________________________________   PROCEDURES  Procedure(s) performed: None  Procedures  Critical Care performed: No  ____________________________________________   INITIAL IMPRESSION / ASSESSMENT AND PLAN / ED COURSE  Pertinent labs & imaging results that were available during my care of the patient were reviewed by me and considered in my medical decision making (see chart for details).   Patient presents requesting psychiatric evaluation.  He is quite agitated, aggressive and then started making statements about wanting to harm people thus placed under IVC.  Known to our psychiatry team.  He denies any acute medical etiology,  and denies clinical symptoms suggest an acute medical etiology of his symptoms.  Clinical Course as of Jan 11 2338  Tue Jan 12, 2019  2057 Patient is medically cleared for psychiatric evaluation at this time   [MQ]    Clinical Course User Index [MQ] Sharyn CreamerQuale, Deonne Rooks, MD   Ongoing care assigned to Dr. York CeriseForbach, follow-up on recommendations from psychiatry team.  ____________________________________________   FINAL CLINICAL IMPRESSION(S) / ED DIAGNOSES  Final diagnoses:  Agitation        Note:  This document was prepared using Dragon voice recognition software and may include unintentional dictation errors       Sharyn CreamerQuale, Westin Knotts, MD 01/12/19 2341

## 2019-01-12 NOTE — ED Notes (Signed)
Hourly rounding reveals patient in room calmly talking to Officer. No complaints, stable, in no acute distress. Q15 minute rounds and monitoring via Engineer, drilling to continue.

## 2019-01-12 NOTE — ED Notes (Signed)
Hourly rounding reveals patient in room. No complaints, stable, in no acute distress. Q15 minute rounds and monitoring via Rover and Officer to continue.   

## 2019-01-12 NOTE — ED Triage Notes (Signed)
PT in triage reporting, " I lost my peace" RN asked when did that happen and pt reported, "that's a good question..." and then continued to stare without answering.   Pt did state he is having thoughts of "doing something wrong that is why I called the sheriff to take me to the hospital." PT also reports he is depressed.

## 2019-01-12 NOTE — ED Notes (Signed)
Pt. Transferred from Triage to 20 hall after dressing out and screening for contraband. Pt. Oriented to Quad including Q15 minute rounds as well as Engineer, drilling for their protection. Patient is alert, anxious, pacing, warm and dry in no acute distress. Patient denies SI, HI, and AVH. Pt. Encouraged to let me know if needs arise.

## 2019-01-12 NOTE — ED Notes (Signed)
IVC PENDING  CONSULT ?

## 2019-01-13 ENCOUNTER — Inpatient Hospital Stay
Admission: AD | Admit: 2019-01-13 | Discharge: 2019-01-26 | DRG: 885 | Disposition: A | Discharge status: Home / Self Care | Payer: Medicare Other | Attending: Psychiatry | Admitting: Psychiatry

## 2019-01-13 ENCOUNTER — Other Ambulatory Visit: Payer: Self-pay

## 2019-01-13 DIAGNOSIS — I10 Essential (primary) hypertension: Secondary | ICD-10-CM | POA: Diagnosis present

## 2019-01-13 DIAGNOSIS — Z7984 Long term (current) use of oral hypoglycemic drugs: Secondary | ICD-10-CM | POA: Diagnosis not present

## 2019-01-13 DIAGNOSIS — F250 Schizoaffective disorder, bipolar type: Secondary | ICD-10-CM | POA: Diagnosis not present

## 2019-01-13 DIAGNOSIS — F209 Schizophrenia, unspecified: Secondary | ICD-10-CM | POA: Diagnosis present

## 2019-01-13 DIAGNOSIS — F419 Anxiety disorder, unspecified: Secondary | ICD-10-CM | POA: Diagnosis present

## 2019-01-13 DIAGNOSIS — E119 Type 2 diabetes mellitus without complications: Secondary | ICD-10-CM | POA: Diagnosis present

## 2019-01-13 DIAGNOSIS — F17210 Nicotine dependence, cigarettes, uncomplicated: Secondary | ICD-10-CM | POA: Diagnosis present

## 2019-01-13 LAB — CBC WITH DIFFERENTIAL/PLATELET
Abs Immature Granulocytes: 0.05 10*3/uL (ref 0.00–0.07)
Basophils Absolute: 0.1 10*3/uL (ref 0.0–0.1)
Basophils Relative: 1 %
Eosinophils Absolute: 0 10*3/uL (ref 0.0–0.5)
Eosinophils Relative: 0 %
HCT: 44.2 % (ref 39.0–52.0)
Hemoglobin: 14.6 g/dL (ref 13.0–17.0)
Immature Granulocytes: 1 %
Lymphocytes Relative: 22 %
Lymphs Abs: 2.4 10*3/uL (ref 0.7–4.0)
MCH: 31.4 pg (ref 26.0–34.0)
MCHC: 33 g/dL (ref 30.0–36.0)
MCV: 95.1 fL (ref 80.0–100.0)
Monocytes Absolute: 0.9 10*3/uL (ref 0.1–1.0)
Monocytes Relative: 8 %
Neutro Abs: 7.4 10*3/uL (ref 1.7–7.7)
Neutrophils Relative %: 68 %
Platelets: 303 10*3/uL (ref 150–400)
RBC: 4.65 MIL/uL (ref 4.22–5.81)
RDW: 12.3 % (ref 11.5–15.5)
WBC: 10.7 10*3/uL — ABNORMAL HIGH (ref 4.0–10.5)
nRBC: 0 % (ref 0.0–0.2)

## 2019-01-13 LAB — SARS CORONAVIRUS 2 BY RT PCR (HOSPITAL ORDER, PERFORMED IN ~~LOC~~ HOSPITAL LAB): SARS Coronavirus 2: NEGATIVE

## 2019-01-13 MED ORDER — VENLAFAXINE HCL ER 75 MG PO CP24
75.0000 mg | ORAL_CAPSULE | Freq: Every day | ORAL | Status: DC
  Administered 2019-01-14 – 2019-01-26 (×13): 75 mg via ORAL
  Filled 2019-01-13 (×13): qty 1

## 2019-01-13 MED ORDER — POLYETHYLENE GLYCOL 3350 17 G PO PACK
1.0000 | PACK | Freq: Every morning | ORAL | Status: DC
  Administered 2019-01-14 – 2019-01-25 (×8): 17 g via ORAL
  Filled 2019-01-13 (×6): qty 1

## 2019-01-13 MED ORDER — ACETAMINOPHEN 325 MG PO TABS: 650.0000 mg | ORAL_TABLET | Freq: Four times a day (QID) | ORAL | Status: DC | PRN

## 2019-01-13 MED ORDER — MAGNESIUM HYDROXIDE 400 MG/5ML PO SUSP: 30.0000 mL | Freq: Every day | ORAL | Status: DC | PRN

## 2019-01-13 MED ORDER — LORATADINE 10 MG PO TABS
10.0000 mg | ORAL_TABLET | Freq: Every day | ORAL | Status: DC
  Administered 2019-01-13 – 2019-01-26 (×14): 10 mg via ORAL
  Filled 2019-01-13 (×15): qty 1

## 2019-01-13 MED ORDER — CLOZAPINE 100 MG PO TABS
200.0000 mg | ORAL_TABLET | Freq: Every day | ORAL | Status: DC
  Administered 2019-01-13 – 2019-01-14 (×2): 200 mg via ORAL
  Filled 2019-01-13 (×2): qty 2

## 2019-01-13 MED ORDER — LITHIUM CARBONATE ER 300 MG PO TBCR
600.0000 mg | EXTENDED_RELEASE_TABLET | Freq: Every day | ORAL | Status: DC
  Administered 2019-01-13: 600 mg via ORAL
  Filled 2019-01-13: qty 2

## 2019-01-13 MED ORDER — METOPROLOL SUCCINATE ER 25 MG PO TB24
150.0000 mg | ORAL_TABLET | Freq: Every day | ORAL | Status: DC
  Administered 2019-01-13: 150 mg via ORAL
  Filled 2019-01-13 (×2): qty 6

## 2019-01-13 MED ORDER — OLANZAPINE 5 MG PO TABS
15.0000 mg | ORAL_TABLET | Freq: Every day | ORAL | Status: DC
  Administered 2019-01-13: 15 mg via ORAL
  Filled 2019-01-13 (×2): qty 1

## 2019-01-13 MED ORDER — METFORMIN HCL 500 MG PO TABS
500.0000 mg | ORAL_TABLET | Freq: Two times a day (BID) | ORAL | Status: DC
  Administered 2019-01-13 – 2019-01-26 (×26): 500 mg via ORAL
  Filled 2019-01-13 (×26): qty 1

## 2019-01-13 MED ORDER — ALUM & MAG HYDROXIDE-SIMETH 200-200-20 MG/5ML PO SUSP: 30.0000 mL | ORAL | Status: DC | PRN

## 2019-01-13 MED ORDER — DESMOPRESSIN ACETATE 0.2 MG PO TABS
0.2000 mg | ORAL_TABLET | Freq: Every day | ORAL | Status: DC
  Administered 2019-01-13 – 2019-01-25 (×13): 0.2 mg via ORAL
  Filled 2019-01-13 (×15): qty 1

## 2019-01-13 MED ORDER — EPINEPHRINE 0.3 MG/0.3ML IJ SOAJ
0.3000 mg | INTRAMUSCULAR | Status: DC
  Filled 2019-01-13: qty 0.6

## 2019-01-13 MED ORDER — IMIPRAMINE HCL 50 MG PO TABS
150.0000 mg | ORAL_TABLET | Freq: Every day | ORAL | Status: DC
  Administered 2019-01-13 – 2019-01-22 (×10): 150 mg via ORAL
  Filled 2019-01-13 (×11): qty 3

## 2019-01-13 MED ORDER — FERROUS SULFATE 325 (65 FE) MG PO TABS
325.0000 mg | ORAL_TABLET | Freq: Every day | ORAL | Status: DC
  Administered 2019-01-14 – 2019-01-26 (×13): 325 mg via ORAL
  Filled 2019-01-13 (×13): qty 1

## 2019-01-13 MED ORDER — CLONAZEPAM 1 MG PO TABS
1.0000 mg | ORAL_TABLET | Freq: Every day | ORAL | Status: DC
  Administered 2019-01-13 – 2019-01-14 (×2): 1 mg via ORAL
  Filled 2019-01-13 (×2): qty 1

## 2019-01-13 NOTE — ED Notes (Signed)
He has ambulated to and from the BR with a steady gait  No verbalized needs or concerns at this time

## 2019-01-13 NOTE — ED Provider Notes (Signed)
-----------------------------------------   5:11 AM on 01/13/2019 -----------------------------------------   Blood pressure (!) 139/95, pulse (!) 106, temperature 99.3 F (37.4 C), temperature source Oral, resp. rate 19, height 1.727 m (5\' 8" ), weight 99.8 kg, SpO2 98 %.  The patient is calm and cooperative at this time.  There have been no acute events since the last update.  Awaiting disposition plan from Behavioral Medicine team.   Hinda Kehr, MD 01/13/19 [phone]

## 2019-01-13 NOTE — BH Assessment (Signed)
Patient is to be admitted to [facility] by Dr. Leverne Humbles.  Attending Physician will be Dr. Weber Cooks.   Patient has been assigned to room 310, by [place].   Intake Paper Work has been signed and placed on patient chart.  ER staff is aware of the admission:  Lattie Haw, ER Secretary    Dr. Cinda Quest, ER MD   Amy T., Patient's Nurse   Butch Penny, Patient Access.

## 2019-01-13 NOTE — ED Notes (Signed)
BEHAVIORAL HEALTH ROUNDING Patient sleeping: No. Patient alert and oriented: yes Behavior appropriate: Yes.  ; If no, describe:  Nutrition and fluids offered: yes Toileting and hygiene offered: Yes  Sitter present: q15 minute observations and security camera monitoring Law enforcement present: Yes  ODS  

## 2019-01-13 NOTE — Plan of Care (Signed)
New admission.  Problem: Education: Goal: Knowledge of [facility] Education information/materials will improve Outcome: Not Progressing Goal: Emotional status will improve Outcome: Not Progressing Goal: Mental status will improve Outcome: Not Progressing Goal: Verbalization of understanding the information provided will improve Outcome: Not Progressing   Problem: Activity: Goal: Interest or engagement in activities will improve Outcome: Not Progressing Goal: Sleeping patterns will improve Outcome: Not Progressing   Problem: Coping: Goal: Coping ability will improve Outcome: Not Progressing Goal: Will verbalize feelings Outcome: Not Progressing   Problem: Health Behavior/Discharge Planning: Goal: Ability to make decisions will improve Outcome: Not Progressing Goal: Compliance with therapeutic regimen will improve Outcome: Not Progressing   Problem: Safety: Goal: Ability to disclose and discuss suicidal ideas will improve Outcome: Not Progressing Goal: Ability to identify and utilize support systems that promote safety will improve Outcome: Not Progressing

## 2019-01-13 NOTE — Tx Team (Signed)
Initial Treatment Plan 01/13/2019 7:01 PM [place]. YBO:[id number]    PATIENT STRESSORS: Medication change or noncompliance Other: Patient states "I don't know" that's the problem   PATIENT STRENGTHS: Motivation for treatment/growth Religious Affiliation   PATIENT IDENTIFIED PROBLEMS: Depression  Hallucinations                   DISCHARGE CRITERIA:  Ability to meet basic life and health needs Improved stabilization in mood, thinking, and/or behavior Motivation to continue treatment in a less acute level of care Reduction of life-threatening or endangering symptoms to within safe limits  PRELIMINARY DISCHARGE PLAN: Outpatient therapy Return to previous living arrangement  PATIENT/FAMILY INVOLVEMENT: This treatment plan has been presented to and reviewed with the patient, Jonathan Alexander..The patient has been given the opportunity to ask questions and make suggestions.  Kriste Broman, RN 01/13/2019, 7:01 PM

## 2019-01-13 NOTE — ED Notes (Signed)
Patient observed lying in bed with eyes closed  Even, unlabored respirations observed   NAD pt appears to be sleeping  I will continue to monitor along with every 15 minute visual observations and ongoing security camera monitoring    

## 2019-01-13 NOTE — Consult Note (Signed)
[facility] Face-to-Face Psychiatry Consult   Reason for Consult: Behavior problems Referring Physician: Dr. Fanny Bienquale Patient Identification: Jonathan Horton MarshallSantana Jr. MRN:  [id number] Principal Diagnosis: Schizophrenia Diagnosis: Schizophrenia  Total Time spent with patient: 1.5 hours  Subjective: "I cannot tell you want of the reason I am here.  My problem I am depending on myself too much." Jonathan Horton MarshallSantana Jr. is a 33 y.o. male patient [facility] ED via law enforcement. During triage the patient stated he was having thoughts of doing something wrong that is why he reached out for help. This is a patient who resides in a group home [facility]) Mr. Jeri Lagerony Miles [phone] is the owner.  He discussed that he has not been having any problems at his group home.  During the patient assessment he had difficulties expressing why he came into the hospital today.  It took him a long time to respond to questions that was presented to him.  He was able to answer more direct questions.The patient was recently [facility] June [facility] of 2020. He is to return in 5 weeks post ECT follow-up.   The patient was seen face-to-face by this provider; chart reviewed and consulted with  Dr. Fanny BienQuale on call on 01/12/2019 due to the care of the patient. It was discussed with the provider that the patient does meet criteria to be admitted to the inpatient unit.  He was seen in ECT services and received treatment on 6-1- 20.  Per patient note he tolerated the procedure well and was to return in 5 weeks.  On evaluation the patient is alert and oriented x3, calm and cooperative, and mood-congruent with affect. The patient does not appear to be responding to internal or external stimuli.  But seem very anxious. The patient is presenting with some "off" thinking. The patient denies auditory or visual hallucinations.  But, did voice experiencing "something inside of me".  The patient denies  any suicidal, homicidal, or self-harm ideations. The patient is not presenting with any psychotic or paranoid behaviors. During an encounter with the patient, he was able to answer some questions appropriately, but very slow in response. Collateral was not obtained [facility] Mr. Jeri Lagerony Miles [phone] due to this provider calling the patient group home unable to leave a HIPPI message.  Plan: The patient is a safety risk to self and others and does require psychiatric inpatient admission for stabilization and treatment.   HPI: Per Dr. Fanny BienQuale; Jonathan Horton MarshallSantana Jr. is a 33 y.o. male who presents voluntarily with the sheriff for concerns of agitation. Patient reports that he does not feel like he is doing well.  He reports he is felt very anxious needs something to "calm him down".  Felt he needed to come to the hospital.  Has a history of psychiatric problems and has received treatment here many times.  Patient reports symptoms are coming back.  He takes medication but seems to be getting worse.  Denies wanting to harm himself but does make statements toward staff here that he wants to hurt someone but is not having a specific person that he intends to hurt  Past Psychiatric History:  Risk to Self:  Yes Risk to Others:  Yes Prior Inpatient Therapy:  Yes Prior Outpatient Therapy:  Yes  Past Medical History:  Past Medical History:  Diagnosis Date  . Schizophrenia (HCC)   . Tachycardia    History reviewed.  No pertinent surgical history. Family History:  Family History  Family history unknown: Yes   Family Psychiatric  History:  Social History:  Social History   Substance and Sexual Activity  Alcohol Use No     Social History   Substance and Sexual Activity  Drug Use No    Social History   Socioeconomic History  . Marital status: Single    Spouse name: Not on file  . Number of children: Not on file  . Years of education: Not on file  . Highest education  level: Not on file  Occupational History  . Not on file  Social Needs  . Financial resource strain: Not on file  . Food insecurity:    Worry: Not on file    Inability: Not on file  . Transportation needs:    Medical: Not on file    Non-medical: Not on file  Tobacco Use  . Smoking status: Current Every Day Smoker    Years: 4.00    Types: Cigarettes  . Smokeless tobacco: Never Used  Substance and Sexual Activity  . Alcohol use: No  . Drug use: No  . Sexual activity: Never  Lifestyle  . Physical activity:    Days per week: Not on file    Minutes per session: Not on file  . Stress: Not on file  Relationships  . Social connections:    Talks on phone: Not on file    Gets together: Not on file    Attends religious service: Not on file    Active member of club or organization: Not on file    Attends meetings of clubs or organizations: Not on file    Relationship status: Not on file  Other Topics Concern  . Not on file  Social History Narrative  . Not on file   Additional Social History:    Allergies:   Allergies  Allergen Reactions  . Divalproex Sodium   . Shellfish-Derived Products     Labs:  Results for orders placed or performed during the hospital encounter of 01/12/19 (from the past 48 hour(s))  Comprehensive metabolic panel     Status: Abnormal   Collection Time: 01/12/19  6:48 PM  Result Value Ref Range   Sodium 137 135 - 145 mmol/L   Potassium 4.2 3.5 - 5.1 mmol/L   Chloride 103 98 - 111 mmol/L   CO2 24 22 - 32 mmol/L   Glucose, Bld 105 (H) 70 - 99 mg/dL   BUN 13 6 - 20 mg/dL   Creatinine, Ser 1.12 0.61 - 1.24 mg/dL   Calcium 9.6 8.9 - 10.3 mg/dL   Total Protein 7.2 6.5 - 8.1 g/dL   Albumin 4.4 3.5 - 5.0 g/dL   AST 17 15 - 41 U/L   ALT 17 0 - 44 U/L   Alkaline Phosphatase 98 38 - 126 U/L   Total Bilirubin 0.4 0.3 - 1.2 mg/dL   GFR calc non Af Amer >60 >60 mL/min   GFR calc Af Amer >60 >60 mL/min   Anion gap 10 5 - 15    Comment: Performed at  [facility], [address], [place], [place] [address]  Ethanol     Status: None   Collection Time: 01/12/19  6:48 PM  Result Value Ref Range   Alcohol, Ethyl (B) <10 <10 mg/dL    Comment: (NOTE) Lowest detectable limit for serum alcohol is 10 mg/dL. For medical purposes only. Performed at [facility], [address] [place], [place] [address]   Salicylate level     Status: None   Collection Time: 01/12/19  6:48 PM  Result Value Ref Range   Salicylate Lvl <7.0 2.8 - 30.0 mg/dL    Comment: Performed at [facility], [address] [place], [place] [address]  Acetaminophen level     Status: Abnormal   Collection Time: 01/12/19  6:48 PM  Result Value Ref Range   Acetaminophen (Tylenol), Serum <10 (L) 10 - 30 ug/mL    Comment: (NOTE) Therapeutic concentrations vary significantly. A range of 10-30 ug/mL  may be an effective concentration for many patients. However, some  are best treated at concentrations outside of this range. Acetaminophen concentrations >150 ug/mL at 4 hours after ingestion  and >50 ug/mL at 12 hours after ingestion are often associated with  toxic reactions. Performed at [facility], [address] [place], [place] [address]   cbc     Status: Abnormal   Collection Time: 01/12/19  6:48 PM  Result Value Ref Range   WBC 11.7 (H) 4.0 - 10.5 K/uL   RBC 4.41 4.22 - 5.81 MIL/uL   Hemoglobin 13.9 13.0 - 17.0 g/dL   HCT 82.942.0 56.239.0 - 13.052.0 %   MCV 95.2 80.0 - 100.0 fL   MCH 31.5 26.0 - 34.0 pg   MCHC 33.1 30.0 - 36.0 g/dL   RDW 86.512.4 78.411.5 - 69.615.5 %   Platelets 287 150 - 400 K/uL   nRBC 0.0 0.0 - 0.2 %    Comment: Performed at [facility], [address] [place], [place] [address]  Urine Drug Screen, Qualitative     Status: Abnormal   Collection Time: 01/12/19  6:48 PM  Result Value Ref Range   Tricyclic, Ur Screen POSITIVE (A) NONE DETECTED   Amphetamines, Ur Screen NONE DETECTED NONE DETECTED   MDMA  (Ecstasy)Ur Screen NONE DETECTED NONE DETECTED   Cocaine Metabolite,Ur [place] NONE DETECTED NONE DETECTED   Opiate, Ur Screen NONE DETECTED NONE DETECTED   Phencyclidine (PCP) Ur S NONE DETECTED NONE DETECTED   Cannabinoid 50 Ng, Ur [place] NONE DETECTED NONE DETECTED   Barbiturates, Ur Screen NONE DETECTED NONE DETECTED   Benzodiazepine, Ur Scrn NONE DETECTED NONE DETECTED   Methadone Scn, Ur NONE DETECTED NONE DETECTED    Comment: (NOTE) Tricyclics + metabolites, urine    Cutoff 1000 ng/mL Amphetamines + metabolites, urine  Cutoff 1000 ng/mL MDMA (Ecstasy), urine              Cutoff 500 ng/mL Cocaine Metabolite, urine          Cutoff 300 ng/mL Opiate + metabolites, urine        Cutoff 300 ng/mL Phencyclidine (PCP), urine         Cutoff 25 ng/mL Cannabinoid, urine                 Cutoff 50 ng/mL Barbiturates + metabolites, urine  Cutoff 200 ng/mL Benzodiazepine, urine              Cutoff 200 ng/mL Methadone, urine                   Cutoff 300 ng/mL The urine drug screen provides only a preliminary, unconfirmed analytical test result and should not be used for non-medical purposes. Clinical consideration and professional judgment should be applied to any positive drug screen result due to possible interfering substances. A more specific alternate chemical method must be used in order to  obtain a confirmed analytical result. Gas chromatography / mass spectrometry (GC/MS) is the preferred confirmat ory method. Performed at [facility], [address] [place], [place] [address]   Lithium level     Status: Abnormal   Collection Time: 01/12/19  6:48 PM  Result Value Ref Range   Lithium Lvl 0.52 (L) 0.60 - 1.20 mmol/L    Comment: Performed at [facility], [address] [place], [place] [address]    No current facility-administered medications for this encounter.    Current Outpatient Medications  Medication Sig Dispense Refill  . clonazePAM (KLONOPIN) 1 MG tablet  TAKE ONE TABLET BY MOUTH AT BEDTIME 30 tablet 4  . clozapine (CLOZARIL) 200 MG tablet TAKE ONE TABLET (200MG ) BY MOUTH EVERY MORNING TAKE 4 TABS (800MG ) BYMOUTH AT BEDTIME. 70 tablet 10  . desmopressin (DDAVP) 0.2 MG tablet Take 1 tablet (0.2 mg total) by mouth at bedtime. 30 tablet 0  . ferrous sulfate 325 (65 FE) MG tablet Take 1 tablet (325 mg total) by mouth daily with breakfast. 30 tablet 0  . imipramine (TOFRANIL) 50 MG tablet TAKE 3 TABLETS BY MOUTH AT BEDTIME 90 tablet 10  . loratadine (CLARITIN) 10 MG tablet Take 1 tablet (10 mg total) by mouth daily. 30 tablet 0  . metFORMIN (GLUCOPHAGE) 500 MG tablet Take 1 tablet (500 mg total) by mouth 2 (two) times daily with a meal. 60 tablet 0  . metoprolol succinate (TOPROL-XL) 100 MG 24 hr tablet Take 150 mg by mouth daily.    Marland Kitchen. OLANZapine (ZYPREXA) 15 MG tablet Take 1 tablet (15 mg total) by mouth at bedtime. 30 tablet 0  . polyethylene glycol powder (GLYCOLAX/MIRALAX) powder MIX 17GM INTO 4-8 OUNCES OF FLUID, THEN TAKE BY MOUTH EVERY MORNING 510 g 0  . EPINEPHrine (EPIPEN 2-PAK) 0.3 mg/0.3 mL IJ SOAJ injection Inject 0.3 mg into the muscle as directed.    . lithium carbonate (LITHOBID) 300 MG CR tablet TAKE TWO TABLETS BY MOUTH AT BEDTIME (Patient not taking: Reported on 01/12/2019) 60 tablet 0  . venlafaxine XR (EFFEXOR-XR) 75 MG 24 hr capsule TAKE 3 CAPSULES (225MG ) BY MOUTH EVERY DAY WITH BREAKFAST *DO NOT CRUSH* (Patient not taking: Reported on 01/12/2019) 90 capsule 0    Musculoskeletal: Strength & Muscle Tone: within normal limits Gait & Station: normal Patient leans: N/A  Psychiatric Specialty Exam: Physical Exam  Nursing note and vitals reviewed. Constitutional: He is oriented to person, place, and time. He appears well-developed and well-nourished.  HENT:  Head: Normocephalic and atraumatic.  Eyes: Pupils are equal, round, and reactive to light. Conjunctivae are normal.  Neck: Normal range of motion. Neck supple.  Cardiovascular:  Normal rate and regular rhythm.  Respiratory: Effort normal and breath sounds normal.  Musculoskeletal: Normal range of motion.  Neurological: He is alert and oriented to person, place, and time.  Skin: Skin is warm and dry.    Review of Systems  Psychiatric/Behavioral: Positive for depression and hallucinations. The patient is nervous/anxious.   All other systems reviewed and are negative.   Blood pressure (!) 139/95, pulse (!) 106, temperature 99.3 F (37.4 C), temperature source Oral, resp. rate 19, height 5\' 8"  (1.727 m), weight 99.8 kg, SpO2 98 %.Body mass index is 33.45 kg/m.  General Appearance: Fairly Groomed  Eye Contact:  Minimal  Speech:  Slow  Volume:  Decreased  Mood:  Anxious and Depressed  Affect:  Blunt, Constricted, Depressed, Flat and Restricted  Thought Process:  Coherent  Orientation:  Full (Time, Place, and Person)  Thought Content:  Logical  Suicidal Thoughts:  No  Homicidal Thoughts:  No  Memory:  Immediate;   Fair Recent;   Fair  Judgement:  Poor  Insight:  Lacking  Psychomotor Activity:  Normal  Concentration:  Concentration: Fair and Attention Span: Fair  Recall:  Poor  Fund of Knowledge:  Fair  Language:  Good  Akathisia:  NA  Handed:  Right  AIMS (if indicated):     Assets:  Desire for Improvement Social Support  ADL's:  Intact  Cognition:  Impaired,  Mild  Sleep:   "Sometimes okay"     Treatment Plan Summary: Daily contact with patient to assess and evaluate symptoms and progress in treatment, Medication management and Plan Patient does meet criteria for psychiatric inpatient admission once a bed becomes available  Disposition: Supportive therapy provided about ongoing stressors. This patient does meet criteria for psychiatric inpatient admission once a bed becomes available  Catalina Gravel, NP 01/13/2019 3:55 AM

## 2019-01-13 NOTE — Progress Notes (Signed)
MEDICATION RELATED CONSULT NOTE - INITIAL   Pharmacy Consult for Clozaril  Indication: ANC monitoring   Allergies  Allergen Reactions  . Divalproex Sodium   . Shellfish-Derived Products     Patient Measurements: Height: 5' 11.7" (182.1 cm) Weight: 210 lb (95.3 kg) IBW/kg (Calculated) : 76.91 Adjusted Body Weight:   Vital Signs: Temp: 99.3 F (37.4 C) (06/10 1655) Temp Source: Oral (06/10 1655) BP: 108/88 (06/10 1655) Pulse Rate: 100 (06/10 1700) Intake/Output from previous day: No intake/output data recorded. Intake/Output from this shift: No intake/output data recorded.  Labs: Recent Labs    01/12/19 1848 01/13/19 1829  WBC 11.7* 10.7*  HGB 13.9 14.6  HCT 42.0 44.2  PLT 287 303  CREATININE 1.12  --   ALBUMIN 4.4  --   PROT 7.2  --   AST 17  --   ALT 17  --   ALKPHOS 98  --   BILITOT 0.4  --    Estimated Creatinine Clearance: 109.8 mL/min (by C-G formula based on SCr of 1.12 mg/dL).   Microbiology: Recent Results (from the past 720 hour(s))  SARS Coronavirus 2 (CEPHEID - Performed in [facility] hospital lab), Hosp Order     Status: None   Collection Time: 01/13/19 11:00 AM  Result Value Ref Range Status   SARS Coronavirus 2 NEGATIVE NEGATIVE Final    Comment: (NOTE) If result is NEGATIVE SARS-CoV-2 target nucleic acids are NOT DETECTED. The SARS-CoV-2 RNA is generally detectable in upper and lower  respiratory specimens during the acute phase of infection. The lowest  concentration of SARS-CoV-2 viral copies this assay can detect is 250  copies / mL. A negative result does not preclude SARS-CoV-2 infection  and should not be used as the sole basis for treatment or other  patient management decisions.  A negative result may occur with  improper specimen collection / handling, submission of specimen other  than nasopharyngeal swab, presence of viral mutation(s) within the  areas targeted by this assay, and inadequate number of viral copies  (<250  copies / mL). A negative result must be combined with clinical  observations, patient history, and epidemiological information. If result is POSITIVE SARS-CoV-2 target nucleic acids are DETECTED. The SARS-CoV-2 RNA is generally detectable in upper and lower  respiratory specimens dur ing the acute phase of infection.  Positive  results are indicative of active infection with SARS-CoV-2.  Clinical  correlation with patient history and other diagnostic information is  necessary to determine patient infection status.  Positive results do  not rule out bacterial infection or co-infection with other viruses. If result is PRESUMPTIVE POSTIVE SARS-CoV-2 nucleic acids MAY BE PRESENT.   A presumptive positive result was obtained on the submitted specimen  and confirmed on repeat testing.  While 2019 novel coronavirus  (SARS-CoV-2) nucleic acids may be present in the submitted sample  additional confirmatory testing may be necessary for epidemiological  and / or clinical management purposes  to differentiate between  SARS-CoV-2 and other Sarbecovirus currently known to infect humans.  If clinically indicated additional testing with an alternate test  methodology [id number]) is advised. The SARS-CoV-2 RNA is generally  detectable in upper and lower respiratory sp ecimens during the acute  phase of infection. The expected result is Negative. Fact Sheet for Patients:  BoilerBrush.com.cyhttps://www.fda.gov/media/136312/download Fact Sheet for Healthcare Providers: https://pope.com/https://www.fda.gov/media/136313/download This test is not yet approved or cleared by the [place] FDA and has been authorized for detection and/or diagnosis of SARS-CoV-2 by FDA under an  Emergency Use Authorization (EUA).  This EUA will remain in effect (meaning this test can be used) for the duration of the COVID-19 declaration under Section 564(b)(1) of the Act, 21 U.S.C. section 360bbb-3(b)(1), unless the authorization is terminated or revoked  sooner. Performed at [facility], [address], [place], [place] [address]     Medical History: Past Medical History:  Diagnosis Date  . Schizophrenia ([place])   . Tachycardia     Medications:  Medications Prior to Admission  Medication Sig Dispense Refill Last Dose  . clonazePAM (KLONOPIN) 1 MG tablet TAKE ONE TABLET BY MOUTH AT BEDTIME 30 tablet 4 unknown at unknown  . clozapine (CLOZARIL) 200 MG tablet TAKE ONE TABLET (200MG ) BY MOUTH EVERY MORNING TAKE 4 TABS (800MG ) BYMOUTH AT BEDTIME. 70 tablet 10 unknown at unknown  . desmopressin (DDAVP) 0.2 MG tablet Take 1 tablet (0.2 mg total) by mouth at bedtime. 30 tablet 0 unknonw at unknown  . EPINEPHrine (EPIPEN 2-PAK) 0.3 mg/0.3 mL IJ SOAJ injection Inject 0.3 mg into the muscle as directed.   prn at prn  . ferrous sulfate 325 (65 FE) MG tablet Take 1 tablet (325 mg total) by mouth daily with breakfast. 30 tablet 0 unknown at unknown  . imipramine (TOFRANIL) 50 MG tablet TAKE 3 TABLETS BY MOUTH AT BEDTIME 90 tablet 10 unknown at unknown  . lithium carbonate (LITHOBID) 300 MG CR tablet TAKE TWO TABLETS BY MOUTH AT BEDTIME (Patient not taking: Reported on 01/12/2019) 60 tablet 0 Not Taking at Unknown time  . loratadine (CLARITIN) 10 MG tablet Take 1 tablet (10 mg total) by mouth daily. 30 tablet 0 unknown at unknown  . metFORMIN (GLUCOPHAGE) 500 MG tablet Take 1 tablet (500 mg total) by mouth 2 (two) times daily with a meal. 60 tablet 0 unknonw at unknown  . metoprolol succinate (TOPROL-XL) 100 MG 24 hr tablet Take 150 mg by mouth daily.   unknown at unknown  . OLANZapine (ZYPREXA) 15 MG tablet Take 1 tablet (15 mg total) by mouth at bedtime. 30 tablet 0 unknown at unknown  . polyethylene glycol powder (GLYCOLAX/MIRALAX) powder MIX 17GM INTO 4-8 OUNCES OF FLUID, THEN TAKE BY MOUTH EVERY MORNING 510 g 0 unknown at unknown  . venlafaxine XR (EFFEXOR-XR) 75 MG 24 hr capsule TAKE 3 CAPSULES (225MG ) BY MOUTH EVERY DAY WITH BREAKFAST *DO  NOT CRUSH* (Patient not taking: Reported on 01/12/2019) 90 capsule 0 Not Taking at Unknown time    Assessment: Pt was on Clozaril 200 mg PO QHS.  03/27:   [place] = 5900  06/10:   ANC = 7400   Goal of Therapy:  CLOZAPINE MONITORING (reflects NEW REMS GUIDELINES EFFECTIVE 05/16/2014):  Check ANC at least weekly while inpatient.  For general population patients, i.e., those without benign ethnic neutropenia (BEN): --If [place] 1000-1499, increase ANC monitoring to 3x/wk --If ANC < 1000, HOLD CLOZAPINE and get psych consult   For patients with BEN: --If [place], increase ANC monitoring to 3x/wk --If ANC < 500, HOLD CLOZAPINE and get psych consult  REMS-certified psychiatry provider can continue drug with [place] below cited thresholds if they document medical opinion that the neutropenia is not clozapine-induced (heme consult is recommended) or that risk of interrupting therapy is greater than the risk of developing severe neutropenia.  --SEE PRESCRIBING INFORMATION FOR ADDITIONAL DETAILS     Plan:  Pt is registered with clozaril REMS program and eligible to receive clozaril.   Pt should have [place] monitored weekly.  Next ANC check on 6/17 @ 1000.   Madicyn Mesina D 01/13/2019,7:50 PM

## 2019-01-13 NOTE — ED Notes (Signed)

## 2019-01-13 NOTE — Progress Notes (Signed)
Admission Note:  Report was received from Amy at 2655 on a 33 year old male who presents IVC in no acute distress for the treatment Depression and SI. Patient appears preoccupied and very hyper-religious stating that "the devil lied to me" and "don't make promises to Jesus, you never know if you can keep them". Patient was calm and cooperative with admission process. Patient denies SI/HI/AH and contracts for safety upon admission. Patient endorses VH of "spiritual stuff" that's been happening for "a long time". Patient has past medical history of Schizophrenia. Skin was assessed with Meredith Mody, RN and found to be clear of any abnormal marks except for long big toe nails. Patient searched and no contraband found and unit policies explained and understanding verbalized. Consents obtained. Food and fluids offered and  accepted. Patient had no additional questions or concerns.

## 2019-01-13 NOTE — ED Notes (Signed)
ED BHU [place] Is the patient under IVC or is there intent for IVC: Yes.   Is the patient medically cleared: Yes.   Is there vacancy in the ED BHU: Yes.   Is the population mix appropriate for patient: Yes.   Is the patient awaiting placement in inpatient or outpatient setting: Yes.   Has the patient had a psychiatric consult: Yes.   Survey of unit performed for contraband, proper placement and condition of furniture, tampering with fixtures in bathroom, shower, and each patient room: Yes.  ; Findings:  APPEARANCE/BEHAVIOR Calm and cooperative NEURO ASSESSMENT Orientation: oriented x3  Denies pain Hallucinations: No.None noted (Hallucinations)  Denies at this time  Speech: Normal Gait: normal RESPIRATORY ASSESSMENT Even  Unlabored respirations  CARDIOVASCULAR ASSESSMENT Pulses equal   regular rate  Skin warm and dry   GASTROINTESTINAL ASSESSMENT no GI complaint EXTREMITIES Full ROM  PLAN OF CARE Provide calm/safe environment. Vital signs assessed twice daily. ED BHU Assessment once each 12-hour shift. Collaborate with TTS if here or as condition indicates. Assure the ED provider has rounded once each shift. Provide and encourage hygiene. Provide redirection as needed. Assess for escalating behavior; address immediately and inform ED provider.  Assess family dynamic and appropriateness for visitation as needed: Yes.  ; If necessary, describe findings:  Educate the patient/family about BHU procedures/visitation: Yes.  ; If necessary, describe findings:

## 2019-01-14 DIAGNOSIS — F250 Schizoaffective disorder, bipolar type: Principal | ICD-10-CM

## 2019-01-14 MED ORDER — METOPROLOL SUCCINATE ER 25 MG PO TB24
100.0000 mg | ORAL_TABLET | Freq: Every day | ORAL | Status: DC
  Administered 2019-01-14 – 2019-01-26 (×13): 100 mg via ORAL
  Filled 2019-01-14 (×11): qty 4

## 2019-01-14 MED ORDER — OLANZAPINE 10 MG PO TABS
20.0000 mg | ORAL_TABLET | Freq: Every day | ORAL | Status: DC
  Administered 2019-01-14 – 2019-01-25 (×12): 20 mg via ORAL
  Filled 2019-01-14 (×13): qty 2

## 2019-01-14 MED ORDER — LITHIUM CARBONATE ER 450 MG PO TBCR
900.0000 mg | EXTENDED_RELEASE_TABLET | Freq: Every day | ORAL | Status: DC
  Administered 2019-01-14 – 2019-01-25 (×12): 900 mg via ORAL
  Filled 2019-01-14 (×13): qty 2

## 2019-01-14 NOTE — Progress Notes (Signed)
D: Patient stated slept good last night .Stated appetite is good and energy level  Is normal. Stated concentration is good . Stated on Depression scale , hopeless and anxiety .( low 0-10 high) Denies suicidal  homicidal ideations  .  No auditory hallucinations  No pain concerns . Appropriate ADL'S. Interacting with peers and staff. Staff redirecting patient  ensuring information  is understandable. Emotional and mental status improving . Patient attending  unit programing . Patient voice no concerns around sleep.   Staff working  with patient on decision making , coping and anxiety. Staff education on ECT , patient unsure of process . Patient denies suicidal  ideations . Compliant  with medication  , instructed on usage .   Patient  inappropriate this Shift  Pulled pennis out of pants twice . Patient making inappropriate statement to his peers sexual. . Patient told staff he wanted to lick her ass . Writer cautioned patient  On behavior . Stated he will try. A: Encourage patient participation with unit programming . Instruction  Given on  Medication , verbalize understanding.  R: Voice no other concerns. Staff continue to monitor

## 2019-01-14 NOTE — Progress Notes (Signed)
Recreation Therapy Notes  Date: 01/14/2019  Time: 9:30 am  Location: Craft room  Behavioral response: Confused, Tangential   Intervention Topic: Values  Discussion/Intervention:  Group content today was focused on values. The group identified what values are and where they come from. Individuals expressed some values and how many they have. Patients described how they go about add or removing values. The group described the importance of having values and how they go about using them in daily life. Patient participated in the intervention "My Values" where they were able to pick out values that were important to them and made a visual aide.  Clinical Observations/Feedback:  Patient came to group and defined values as art, He stated that values come from life experiences. Participant had difficulty focusing on the activity and following instructions. Individual was social with peers and staff while participating in the intervention. Jonathan Alexander LRT/CTRS         Jonathan Alexander 01/14/2019 11:55 AM

## 2019-01-14 NOTE — Progress Notes (Signed)
D- Patient alert and oriented. Anxious, restless. Denies SI, HI, AVH, and pain. Quotes prn. Preeoccupation with hyper-religious discussion. Goal:none set at present. A- Scheduled medications administered to patient, per MD orders. Support and encouragement provided.  Routine safety checks conducted every 15 minutes.  Patient informed to notify staff with problems or concerns. R- No adverse drug reactions noted. Patient contracts for safety at this time. Patient compliant with medications and treatment plan.  Patient remains safe at this time.
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
## 2019-01-14 NOTE — Plan of Care (Signed)
Staff redirecting patient  ensuring information  is understandable. Emotional and mental status improving . Patient attending  unit programing . Patient voice no concerns around sleep.   Staff working  with patient on decision making , coping and anxiety. Staff education on ECT , patient unsure of process . Patient denies suicidal  ideations . Compliant  with medication  , instructed on usage .   Problem: Education: Goal: Knowledge of [facility] Education information/materials will improve Outcome: Progressing Goal: Emotional status will improve Outcome: Progressing Goal: Mental status will improve Outcome: Progressing Goal: Verbalization of understanding the information provided will improve Outcome: Progressing   Problem: Activity: Goal: Interest or engagement in activities will improve Outcome: Progressing Goal: Sleeping patterns will improve Outcome: Progressing   Problem: Coping: Goal: Coping ability will improve Outcome: Progressing Goal: Will verbalize feelings Outcome: Progressing   Problem: Safety: Goal: Ability to disclose and discuss suicidal ideas will improve Outcome: Progressing Goal: Ability to identify and utilize support systems that promote safety will improve Outcome: Progressing

## 2019-01-14 NOTE — [facility] Suicide Risk Assessment (Signed)
[place] INPATIENT:  Family/Significant Other Suicide Prevention Education  Suicide Prevention Education:  Education Completed; Jonathan Alexander, legal guardian [phone] has been identified by the patient as the family member/significant other with whom the patient will be residing, and identified as the person(s) who will aid the patient in the event of a mental health crisis (suicidal ideations/suicide attempt).  With written consent from the patient, the family member/significant other has been provided the following suicide prevention education, prior to the and/or following the discharge of the patient.  The suicide prevention education provided includes the following:  Suicide risk factors  Suicide prevention and interventions  National Suicide Hotline telephone number  [facility] assessment telephone number  [facility] Emergency Assistance [place] and/or Residential Mobile Crisis Unit telephone number  Request made of family/significant other to:  Remove weapons (e.g., guns, rifles, knives), all items previously/currently identified as safety concern.    Remove drugs/medications (over-the-counter, prescriptions, illicit drugs), all items previously/currently identified as a safety concern.  The family member/significant other verbalizes understanding of the suicide prevention education information provided.  The family member/significant other agrees to remove the items of safety concern listed above. Jonathan Alexander reports the pt called 911 and requested he be brought to the hospital due to him experiencing AH/VH. He says the pt becomes hyperreligious when he is not well. He reports the pt receives outpatient ECT with Jonathan Alexander and [facility]. He denies the pt having acces to guns or weapons in the group home where he resides.   Hala Narula T Tremain Rucinski 01/14/2019, 10:33 AM

## 2019-01-14 NOTE — H&P (Signed)
Psychiatric Admission Assessment Adult  Patient Identification: Jonathan Horton MarshallSantana Jr. MRN:  [id number] Date of Evaluation:  01/14/2019 Chief Complaint:  MDD Principal Diagnosis: Schizoaffective disorder, bipolar type (HCC) Diagnosis:  Principal Problem:   Schizoaffective disorder, bipolar type (HCC) Active Problems:   Hypertension   Schizophrenia (HCC)  History of Present Illness: Patient seen and chart reviewed.  Patient known from previous encounters.  This is a patient with schizoaffective disorder who has been recently fairly stable living in his group home with medication management and ECT.  He presented to the emergency room last night evidently at his own initiative.  From what I can tell in the chart it looks like he had a hard time articulating to the staff exactly why he was there but clearly seemed to be not thinking clearly.  On interview today the patient again has a hard time explaining to me why he is in the hospital.  Speaks in very vague generalities about needing some kind of help.  When asked about specific symptoms tends to denied him.  Denied any suicidal or homicidal ideation.  Claims that he is taking his medicine regularly.  Denies any substance abuse.  After I spoke with him later in the afternoon the patient became agitated and started becoming sexually aggressive on the unit flashing himself and propositioning patients and staff.  This all suggest that maybe he is getting more manic. Associated Signs/Symptoms: Depression Symptoms:  insomnia, psychomotor agitation, (Hypo) Manic Symptoms:  Elevated Mood, Sexually Inapproprite Behavior, Anxiety Symptoms:  Excessive Worry, Psychotic Symptoms:  Paranoia, PTSD Symptoms: Negative Total Time spent with patient: 1 hour  Past Psychiatric History: What I know of this gentleman is that he has had chronic mental health problems and has had several hospitalizations including [facility] during which he was only stabilized with more than 1 antipsychotic plus mood stabilizers plus ECT treatment.  Evidently has a past history of some aggression although I have not ever seen him be violent.  We have seen him have at least 1 prior brief admission to the hospital here for agitation which resolved pretty quickly.  Is the patient at risk to self? No.  Has the patient been a risk to self in the past 6 months? No.  Has the patient been a risk to self within the distant past? No.  Is the patient a risk to others? Yes.    Has the patient been a risk to others in the past 6 months? No.  Has the patient been a risk to others within the distant past? Yes.     Prior Inpatient Therapy:   Prior Outpatient Therapy:    Alcohol Screening: 1. How often do you have a drink containing alcohol?: Never 2. How many drinks containing alcohol do you have on a typical day when you are drinking?: 1 or 2 3. How often do you have six or more drinks on one occasion?: Never AUDIT-C Score: 0 4. How often during the last year have you found that you were not able to stop drinking once you had started?: Never 5. How often during the last year have you failed to do what was normally expected from you becasue of drinking?: Never 6. How often during the last year have you needed a first drink in the morning to get yourself going after a heavy drinking session?: Never 7. How often during the last year have you had a feeling of guilt of remorse after drinking?: Never  8. How often during the last year have you been unable to remember what happened the night before because you had been drinking?: Never 9. Have you or someone else been injured as a result of your drinking?: No 10. Has a relative or friend or a doctor or another health worker been concerned about your drinking or suggested you cut down?: No Alcohol Use Disorder Identification Test Final Score (AUDIT): 0 Alcohol Brief Interventions/Follow-up:  AUDIT Score <7 follow-up not indicated Substance Abuse History in the last 12 months:  No. Consequences of Substance Abuse: Negative Previous Psychotropic Medications: Yes  Psychological Evaluations: Yes  Past Medical History:  Past Medical History:  Diagnosis Date  . Schizophrenia (HCC)   . Tachycardia    History reviewed. No pertinent surgical history. Family History:  Family History  Family history unknown: Yes   Family Psychiatric  History: Nothing really known about this.  Patient's not able to tell us. Tobacco Screening: Have you used any form of tobacco in the last 30 days? (Cigarettes, Smokeless Tobacco, Cigars, and/or Pipes): Yes Tobacco use, Select all that apply: 5 or more cigarettes per day Are you interested in Tobacco Cessation Medications?: Yes, will notify MD for an order Counseled patient on smoking cessation including recognizing danger situations, developing coping skills and basic information about quitting provided: Refused/Declined practical counseling Social History:  Social History   Substance and Sexual Activity  Alcohol Use No     Social History   Substance and Sexual Activity  Drug Use No    Additional Social History:      Pain Medications: see PTA Prescriptions: see PTA Over the Counter: see PTA History of alcohol / drug use?: No history of alcohol / drug abuse Negative Consequences of Use: Personal relationships Withdrawal Symptoms: Other (Comment)(none)                    Allergies:   Allergies  Allergen Reactions  . Divalproex Sodium   . Shellfish-Derived Products    Lab Results:  Results for orders placed or performed during the hospital encounter of 01/13/19 (from the past 48 hour(s))  CBC with Differential/Platelet     Status: Abnormal   Collection Time: 01/13/19  6:29 PM  Result Value Ref Range   WBC 10.7 (H) 4.0 - 10.5 K/uL   RBC 4.65 4.22 - 5.81 MIL/uL   Hemoglobin 14.6 13.0 - 17.0 g/dL   HCT 56.2 13.0 - 86.5 %    MCV 95.1 80.0 - 100.0 fL   MCH 31.4 26.0 - 34.0 pg   MCHC 33.0 30.0 - 36.0 g/dL   RDW 78.4 69.6 - 29.5 %   Platelets 303 150 - 400 K/uL   nRBC 0.0 0.0 - 0.2 %   Neutrophils Relative % 68 %   Neutro Abs 7.4 1.7 - 7.7 K/uL   Lymphocytes Relative 22 %   Lymphs Abs 2.4 0.7 - 4.0 K/uL   Monocytes Relative 8 %   Monocytes Absolute 0.9 0.1 - 1.0 K/uL   Eosinophils Relative 0 %   Eosinophils Absolute 0.0 0.0 - 0.5 K/uL   Basophils Relative 1 %   Basophils Absolute 0.1 0.0 - 0.1 K/uL   Immature Granulocytes 1 %   Abs Immature Granulocytes 0.05 0.00 - 0.07 K/uL    Comment: Performed at [facility], [address], [place], [place] [address]    Blood Alcohol level:  Lab Results  Component Value Date   ETH <10 01/12/2019   ETH <10 02/09/2018  Metabolic Disorder Labs:  Lab Results  Component Value Date   HGBA1C 5.0 02/09/2018   MPG 97 02/09/2018   No results found for: PROLACTIN Lab Results  Component Value Date   CHOL 167 02/09/2018   TRIG 169 (H) 02/09/2018   HDL 38 (L) 02/09/2018   CHOLHDL 4.4 02/09/2018   VLDL 34 02/09/2018   LDLCALC 95 02/09/2018    Current Medications: Current Facility-Administered Medications  Medication Dose Route Frequency Provider Last Rate Last Dose  . acetaminophen (TYLENOL) tablet 650 mg  650 mg Oral Q6H PRN [name], Jacqueline, NP      . alum & mag hydroxide-simeth (MAALOX/MYLANTA) 200-200-20 MG/5ML suspension 30 mL  30 mL Oral Q4H PRN [name], Jacqueline, NP      . clonazePAM (KLONOPIN) tablet 1 mg  1 mg Oral QHS Thomspon, Adela LankJacqueline, NP   1 mg at 01/13/19 2201  . cloZAPine (CLOZARIL) tablet 200 mg  200 mg Oral QHS Thomspon, Adela LankJacqueline, NP   200 mg at 01/13/19 2203  . desmopressin (DDAVP) tablet 0.2 mg  0.2 mg Oral QHS Thomspon, Adela LankJacqueline, NP   0.2 mg at 01/13/19 2250  . EPINEPHrine (EPI-PEN) injection 0.3 mg  0.3 mg Intramuscular UD Thomspon, Jacqueline, NP      . ferrous sulfate tablet 325 mg  325 mg Oral Q breakfast [name],  Jacqueline, NP   325 mg at 01/14/19 0824  . imipramine (TOFRANIL) tablet 150 mg  150 mg Oral QHS Thomspon, Adela LankJacqueline, NP   150 mg at 01/13/19 2250  . lithium carbonate (ESKALITH) CR tablet 900 mg  900 mg Oral QHS Clapacs, John T, MD      . loratadine (CLARITIN) tablet 10 mg  10 mg Oral Daily [name], Jacqueline, NP   10 mg at 01/14/19 0824  . magnesium hydroxide (MILK OF MAGNESIA) suspension 30 mL  30 mL Oral Daily PRN [name], Jacqueline, NP      . metFORMIN (GLUCOPHAGE) tablet 500 mg  500 mg Oral BID [name], Adela LankJacqueline, NP   500 mg at 01/14/19 0824  . [START ON 01/15/2019] metoprolol succinate (TOPROL-XL) 24 hr tablet 100 mg  100 mg Oral Daily Clapacs, John T, MD      . OLANZapine (ZYPREXA) tablet 20 mg  20 mg Oral QHS Clapacs, John T, MD      . polyethylene glycol (MIRALAX / GLYCOLAX) packet 17 g  1 packet Oral q morning - 10a [name], Jacqueline, NP   17 g at 01/14/19 1212  . venlafaxine XR (EFFEXOR-XR) 24 hr capsule 75 mg  75 mg Oral Q breakfast [name], Jacqueline, NP   75 mg at 01/14/19 [id number]   PTA Medications: Medications Prior to Admission  Medication Sig Dispense Refill Last Dose  . clonazePAM (KLONOPIN) 1 MG tablet TAKE ONE TABLET BY MOUTH AT BEDTIME 30 tablet 4   . clozapine (CLOZARIL) 200 MG tablet TAKE ONE TABLET (200MG ) BY MOUTH EVERY MORNING TAKE 4 TABS (800MG ) BYMOUTH AT BEDTIME. 70 tablet 10   . desmopressin (DDAVP) 0.2 MG tablet Take 1 tablet (0.2 mg total) by mouth at bedtime. 30 tablet 0   . EPINEPHrine (EPIPEN 2-PAK) 0.3 mg/0.3 mL IJ SOAJ injection Inject 0.3 mg into the muscle as directed.     . ferrous sulfate 325 (65 FE) MG tablet Take 1 tablet (325 mg total) by mouth daily with breakfast. 30 tablet 0   . imipramine (TOFRANIL) 50 MG tablet TAKE 3 TABLETS BY MOUTH AT BEDTIME 90 tablet 10   . lithium carbonate (LITHOBID) 300 MG  CR tablet TAKE TWO TABLETS BY MOUTH AT BEDTIME (Patient not taking: Reported on 01/12/2019) 60 tablet 0   . loratadine (CLARITIN) 10 MG  tablet Take 1 tablet (10 mg total) by mouth daily. 30 tablet 0   . metFORMIN (GLUCOPHAGE) 500 MG tablet Take 1 tablet (500 mg total) by mouth 2 (two) times daily with a meal. 60 tablet 0   . metoprolol succinate (TOPROL-XL) 100 MG 24 hr tablet Take 150 mg by mouth daily.     Marland Kitchen. OLANZapine (ZYPREXA) 15 MG tablet Take 1 tablet (15 mg total) by mouth at bedtime. 30 tablet 0   . polyethylene glycol powder (GLYCOLAX/MIRALAX) powder MIX 17GM INTO 4-8 OUNCES OF FLUID, THEN TAKE BY MOUTH EVERY MORNING 510 g 0   . venlafaxine XR (EFFEXOR-XR) 75 MG 24 hr capsule TAKE 3 CAPSULES (225MG ) BY MOUTH EVERY DAY WITH BREAKFAST *DO NOT CRUSH* (Patient not taking: Reported on 01/12/2019) 90 capsule 0     Musculoskeletal: Strength & Muscle Tone: within normal limits Gait & Station: normal Patient leans: Right  Psychiatric Specialty Exam: Physical Exam  Nursing note and vitals reviewed. Constitutional: He appears well-developed and well-nourished.  HENT:  Head: Normocephalic and atraumatic.  Eyes: Pupils are equal, round, and reactive to light. Conjunctivae are normal.  Neck: Normal range of motion.  Cardiovascular: Regular rhythm and normal heart sounds.  Respiratory: Effort normal. No respiratory distress.  GI: Soft.  Musculoskeletal: Normal range of motion.  Neurological: He is alert.  Skin: Skin is warm and dry.  Psychiatric: His affect is labile. His speech is tangential. He is agitated. He is not aggressive. Thought content is paranoid. Cognition and memory are impaired. He expresses impulsivity. He expresses no homicidal and no suicidal ideation.    Review of Systems  Constitutional: Negative.   HENT: Negative.   Eyes: Negative.   Respiratory: Negative.   Cardiovascular: Negative.   Gastrointestinal: Negative.   Musculoskeletal: Negative.   Skin: Negative.   Neurological: Negative.   Psychiatric/Behavioral: Negative for depression, hallucinations, substance abuse and suicidal ideas. The patient  is nervous/anxious and has insomnia.     Blood pressure 125/82, pulse 76, temperature 98.9 F (37.2 C), temperature source Oral, resp. rate 18, height 5' 11.7" (1.821 m), weight 95.3 kg, SpO2 100 %.Body mass index is 28.72 kg/m.  General Appearance: Casual  Eye Contact:  Fair  Speech:  Slow  Volume:  Decreased  Mood:  Euthymic  Affect:  Inappropriate and Labile  Thought Process:  Disorganized  Orientation:  Full (Time, Place, and Person)  Thought Content:  Illogical, Rumination and Tangential  Suicidal Thoughts:  No  Homicidal Thoughts:  No  Memory:  Immediate;   Fair Recent;   Poor Remote;   Poor  Judgement:  Impaired  Insight:  Shallow  Psychomotor Activity:  Restlessness  Concentration:  Concentration: Fair  Recall:  [employer]Fair  Fund of Knowledge:  Fair  Language:  Fair  Akathisia:  No  Handed:  Right  AIMS (if indicated):     Assets:  Desire for Improvement Housing Resilience  ADL's:  Impaired  Cognition:  Impaired,  Mild and Moderate  Sleep:  Number of Hours: 6.5    Treatment Plan Summary: Daily contact with patient to assess and evaluate symptoms and progress in treatment, Medication management and Plan Once it became clear that he was showing sexually inappropriate behaviors and agitation and probably was having something more like a early manic episode I have increased his lithium to compensate for the low blood  level he had on admission.  We will check a clozapine level tomorrow.  I am increasing his evening Zyprexa as well.  I will work with the nurses and treatment team to get him in a safe position and help to control his behavior.  Observation Level/Precautions:  15 minute checks  Laboratory:  Chemistry Profile  Psychotherapy:    Medications:    Consultations:    Discharge Concerns:    Estimated LOS:  Other:     Physician Treatment Plan for Primary Diagnosis: Schizoaffective disorder, bipolar type ([employer]) Long Term Goal(s): Improvement in symptoms so as ready  for discharge  Short Term Goals: Ability to verbalize feelings will improve and Ability to demonstrate self-control will improve  Physician Treatment Plan for Secondary Diagnosis: Principal Problem:   Schizoaffective disorder, bipolar type ([place]) Active Problems:   Hypertension   Schizophrenia ([place])  Long Term Goal(s): Improvement in symptoms so as ready for discharge  Short Term Goals: Ability to maintain clinical measurements within normal limits will improve and Compliance with prescribed medications will improve  I certify that inpatient services furnished can reasonably be expected to improve the patient's condition.    Alethia Berthold, MD 6/11/20203:59 PM

## 2019-01-14 NOTE — Plan of Care (Signed)
Patient observed to have persistent tremors in UE bilat. Emotionally preoccupied with "God" and  with other staff and patients. Tolerated medicines without adverse reaction nor complications. Sleeping  through the night thus far. He continues  to move from subject to subject without being able to concentrate or make decisions. Problem: Activity: Goal: Interest or engagement in activities will improve Outcome: Progressing   Problem: Education: Goal: Emotional status will improve Outcome: Not Progressing   Problem: Education: Goal: Mental status will improve Outcome: Not Progressing

## 2019-01-14 NOTE — BHH Group Notes (Signed)
Balance In Life 01/14/2019 1PM  Type of Therapy/Topic:  Group Therapy:  Balance in Life  Participation Level:  Active  Description of Group:   This group will address the concept of balance and how it feels and looks when one is unbalanced. Patients will be encouraged to process areas in their lives that are out of balance and identify reasons for remaining unbalanced. Facilitators will guide patients in utilizing problem-solving interventions to address and correct the stressor making their life unbalanced. Understanding and applying boundaries will be explored and addressed for obtaining and maintaining a balanced life. Patients will be encouraged to explore ways to assertively make their unbalanced needs known to significant others in their lives, using other group members and facilitator for support and feedback.  Therapeutic Goals: 1. Patient will identify two or more emotions or situations they have that consume much of in their lives. 2. Patient will identify signs/triggers that life has become out of balance:  3. Patient will identify two ways to set boundaries in order to achieve balance in their lives:  4. Patient will demonstrate ability to communicate their needs through discussion and/or role plays  Summary of Patient Progress:  Actively and appropriately engaged in the group. Patient was able to provide support and validation to other group members. Patient demonstrated insight and respected boundaries during group session.  Therapeutic Modalities:   Cognitive Behavioral Therapy Solution-Focused Therapy Assertiveness Training  Donta Fuster Lynelle Smoke, LCSW

## 2019-01-14 NOTE — BHH Suicide Risk Assessment (Signed)
[facility] Admission Suicide Risk Assessment   Nursing information obtained from:  Patient Demographic factors:  Male, Adolescent or young adult, Low socioeconomic status, Unemployed Current Mental Status:  NA Loss Factors:  Financial problems / change in socioeconomic status Historical Factors:  NA Risk Reduction Factors:  Religious beliefs about death  Total Time spent with patient: 1 hour Principal Problem: Schizoaffective disorder, bipolar type ([place]) Diagnosis:  Principal Problem:   Schizoaffective disorder, bipolar type ([place]) Active Problems:   Hypertension   Schizophrenia ([place])  Subjective Data: Patient seen and chart reviewed.  Patient is familiar to me from previous encounters.  Came to the emergency room with odd complaints and disorganized thoughts.  On interview today the patient was difficult to draw out.  He was conversant but could never quite articulate why he felt he needed to be in the hospital.  Denied suicidal or homicidal intent.  Vaguely denied acute hallucinations.  Patient showed some inappropriate behavior with some inappropriate sexual contact [place].  Later in the afternoon he became more explicit with this sexually propositioning patients and staff on the unit.  Still does not have the insight to really articulate what is going on with him.  Continued Clinical Symptoms:  Alcohol Use Disorder Identification Test Final Score (AUDIT): 0 The "Alcohol Use Disorders Identification Test", Guidelines for Use in Primary Care, Second Edition.  World Pharmacologist [facility]). Score between 0-7:  no or low risk or alcohol related problems. Score between 8-15:  moderate risk of alcohol related problems. Score between 16-19:  high risk of alcohol related problems. Score 20 or above:  warrants further diagnostic evaluation for alcohol dependence and treatment.   CLINICAL FACTORS:   Schizophrenia:   Less than 79 years old   Musculoskeletal: Strength & Muscle Tone: within normal  limits Gait & Station: normal Patient leans: N/A  Psychiatric Specialty Exam: Physical Exam  Nursing note and vitals reviewed. Constitutional: He appears well-developed and well-nourished.  HENT:  Head: Normocephalic and atraumatic.  Eyes: Pupils are equal, round, and reactive to light. Conjunctivae are normal.  Neck: Normal range of motion.  Cardiovascular: Regular rhythm and normal heart sounds.  Respiratory: Effort normal.  GI: Soft.  Musculoskeletal: Normal range of motion.  Neurological: He is alert.  Skin: Skin is warm and dry.  Psychiatric: His affect is labile and inappropriate. His speech is tangential. He is not agitated and not aggressive. Thought content is paranoid. Cognition and memory are impaired. He expresses impulsivity and inappropriate judgment. He expresses no homicidal and no suicidal ideation.    Review of Systems  Constitutional: Negative.   HENT: Negative.   Eyes: Negative.   Respiratory: Negative.   Cardiovascular: Negative.   Gastrointestinal: Negative.   Musculoskeletal: Negative.   Skin: Negative.   Neurological: Negative.   Psychiatric/Behavioral: Negative for depression, hallucinations, memory loss, substance abuse and suicidal ideas. The patient is nervous/anxious. The patient does not have insomnia.     Blood pressure 125/82, pulse 76, temperature 98.9 F (37.2 C), temperature source Oral, resp. rate 18, height 5' 11.7" (1.821 m), weight 95.3 kg, SpO2 100 %.Body mass index is 28.72 kg/m.  General Appearance: Casual  Eye Contact:  Fair  Speech:  Slow  Volume:  Decreased  Mood:  Anxious  Affect:  Inappropriate and Labile  Thought Process:  Disorganized  Orientation:  Full (Time, Place, and Person)  Thought Content:  Illogical, Paranoid Ideation, Rumination and Tangential  Suicidal Thoughts:  No  Homicidal Thoughts:  No  Memory:  Immediate;  Fair Recent;   Poor Remote;   Poor  Judgement:  Impaired  Insight:  Shallow  Psychomotor  Activity:  Restlessness  Concentration:  Concentration: Fair  Recall:  Poor  Fund of Knowledge:  Fair  Language:  Fair  Akathisia:  No  Handed:  Right  AIMS (if indicated):     Assets:  Desire for Improvement Financial Resources/Insurance Housing Physical Health  ADL's:  Intact  Cognition:  Impaired,  Moderate  Sleep:  Number of Hours: 6.5      COGNITIVE FEATURES THAT CONTRIBUTE TO RISK:  Loss of executive function    SUICIDE RISK:   Minimal: No identifiable suicidal ideation.  Patients presenting with no risk factors but with morbid ruminations; may be classified as minimal risk based on the severity of the depressive symptoms  PLAN OF CARE: Patient will be kept on 15-minute checks for now unless behavior worsens.  Medication prescribed and levels will be checked.  Alterations in medicines as appropriate to symptoms.  Reassess suicidality and safety prior to discharge planning  I certify that inpatient services furnished can reasonably be expected to improve the patient's condition.   Mordecai RasmussenJohn Emil Weigold, MD 01/14/2019, 3:55 PM

## 2019-01-14 NOTE — Progress Notes (Signed)
D- Patient alert and oriented. Anxious and agitated.. Denies SI, HI, AVH, and pain. At  Present unable to set goals but distracts esily. A- Scheduled medications administered to patient, per MD orders. Support and encouragement provided.  Routine safety checks conducted every 15 minutes.  Patient informed to notify staff with problems or concerns. R- No adverse drug reactions noted. Patient contracts for safety at this time. Patient compliant with medications and treatment plan. Patient receptive, calm, and cooperative.  Patient remains safe at this time

## 2019-01-15 ENCOUNTER — Other Ambulatory Visit: Payer: Self-pay | Admitting: Psychiatry

## 2019-01-15 MED ORDER — CLOZAPINE 25 MG PO TABS
250.0000 mg | ORAL_TABLET | Freq: Every day | ORAL | Status: DC
  Administered 2019-01-15 – 2019-01-17 (×3): 250 mg via ORAL
  Filled 2019-01-15 (×3): qty 2

## 2019-01-15 MED ORDER — CLONAZEPAM 1 MG PO TABS
1.0000 mg | ORAL_TABLET | Freq: Three times a day (TID) | ORAL | Status: DC
  Administered 2019-01-15 – 2019-01-26 (×29): 1 mg via ORAL
  Filled 2019-01-15 (×28): qty 1

## 2019-01-15 NOTE — Progress Notes (Signed)
Patient alert and oriented x 4, affect is blunted, appears irritable at times, denies SI/HI/AVH but noted responding to internal stimuli.  Patient was noted to be sexually inappropriate, flirtatious and making sexual comments to staff. Patient was frequently redirected and writer set limits with p[atient. Patient is compliant with medication regimen. 15 minutes safety checks maintained will continue to monitor.

## 2019-01-15 NOTE — Tx Team (Addendum)
Interdisciplinary Treatment and Diagnostic Plan Update  01/15/2019 Time of Session: 2:30pm Jonathan Horton MarshallSantana Jr. MRN: [id number]  Principal Diagnosis: Schizoaffective disorder, bipolar type (HCC)  Secondary Diagnoses: Principal Problem:   Schizoaffective disorder, bipolar type (HCC) Active Problems:   Hypertension   Schizophrenia (HCC)   Current Medications:  Current Facility-Administered Medications  Medication Dose Route Frequency Provider Last Rate Last Dose  . acetaminophen (TYLENOL) tablet 650 mg  650 mg Oral Q6H PRN [name], Jacqueline, NP      . alum & mag hydroxide-simeth (MAALOX/MYLANTA) 200-200-20 MG/5ML suspension 30 mL  30 mL Oral Q4H PRN [name], Jacqueline, NP      . clonazePAM (KLONOPIN) tablet 1 mg  1 mg Oral QHS Thomspon, Adela LankJacqueline, NP   1 mg at 01/14/19 2214  . cloZAPine (CLOZARIL) tablet 200 mg  200 mg Oral QHS Thomspon, Adela LankJacqueline, NP   200 mg at 01/14/19 2214  . desmopressin (DDAVP) tablet 0.2 mg  0.2 mg Oral QHS Thomspon, Adela LankJacqueline, NP   0.2 mg at 01/14/19 2214  . EPINEPHrine (EPI-PEN) injection 0.3 mg  0.3 mg Intramuscular UD Thomspon, Jacqueline, NP      . ferrous sulfate tablet 325 mg  325 mg Oral Q breakfast [name], Jacqueline, NP   325 mg at 01/15/19 [id number]  . imipramine (TOFRANIL) tablet 150 mg  150 mg Oral QHS [name], Jacqueline, NP   150 mg at 01/14/19 2214  . lithium carbonate (ESKALITH) CR tablet 900 mg  900 mg Oral QHS Clapacs, Jackquline DenmarkJohn T, MD   900 mg at 01/14/19 2214  . loratadine (CLARITIN) tablet 10 mg  10 mg Oral Daily [name], Jacqueline, NP   10 mg at 01/15/19 [id number]  . magnesium hydroxide (MILK OF MAGNESIA) suspension 30 mL  30 mL Oral Daily PRN [name], Jacqueline, NP      . metFORMIN (GLUCOPHAGE) tablet 500 mg  500 mg Oral BID WC [name], Jacqueline, NP   500 mg at 01/15/19 [id number]  . metoprolol succinate (TOPROL-XL) 24 hr tablet 100 mg  100 mg Oral Daily Clapacs, Jackquline DenmarkJohn T, MD   100 mg at 01/15/19 0821  . OLANZapine (ZYPREXA) tablet 20 mg  20 mg  Oral QHS Clapacs, Jackquline DenmarkJohn T, MD   20 mg at 01/14/19 2214  . polyethylene glycol (MIRALAX / GLYCOLAX) packet 17 g  1 packet Oral q morning - 10a [name], Jacqueline, NP   17 g at 01/14/19 1212  . venlafaxine XR (EFFEXOR-XR) 24 hr capsule 75 mg  75 mg Oral Q breakfast [name], Jacqueline, NP   75 mg at 01/15/19 [id number]   PTA Medications: Medications Prior to Admission  Medication Sig Dispense Refill Last Dose  . clonazePAM (KLONOPIN) 1 MG tablet TAKE ONE TABLET BY MOUTH AT BEDTIME 30 tablet 4   . clozapine (CLOZARIL) 200 MG tablet TAKE ONE TABLET (200MG ) BY MOUTH EVERY MORNING TAKE 4 TABS (800MG ) BYMOUTH AT BEDTIME. 70 tablet 10   . desmopressin (DDAVP) 0.2 MG tablet Take 1 tablet (0.2 mg total) by mouth at bedtime. 30 tablet 0   . EPINEPHrine (EPIPEN 2-PAK) 0.3 mg/0.3 mL IJ SOAJ injection Inject 0.3 mg into the muscle as directed.     . ferrous sulfate 325 (65 FE) MG tablet Take 1 tablet (325 mg total) by mouth daily with breakfast. 30 tablet 0   . imipramine (TOFRANIL) 50 MG tablet TAKE 3 TABLETS BY MOUTH AT BEDTIME 90 tablet 10   . lithium carbonate (LITHOBID) 300 MG CR tablet TAKE TWO TABLETS BY MOUTH AT BEDTIME (Patient not taking: Reported  on 01/12/2019) 60 tablet 0   . loratadine (CLARITIN) 10 MG tablet Take 1 tablet (10 mg total) by mouth daily. 30 tablet 0   . metFORMIN (GLUCOPHAGE) 500 MG tablet Take 1 tablet (500 mg total) by mouth 2 (two) times daily with a meal. 60 tablet 0   . metoprolol succinate (TOPROL-XL) 100 MG 24 hr tablet Take 150 mg by mouth daily.     Marland Kitchen. OLANZapine (ZYPREXA) 15 MG tablet Take 1 tablet (15 mg total) by mouth at bedtime. 30 tablet 0   . polyethylene glycol powder (GLYCOLAX/MIRALAX) powder MIX 17GM INTO 4-8 OUNCES OF FLUID, THEN TAKE BY MOUTH EVERY MORNING 510 g 0   . venlafaxine XR (EFFEXOR-XR) 75 MG 24 hr capsule TAKE 3 CAPSULES (225MG ) BY MOUTH EVERY DAY WITH BREAKFAST *DO NOT CRUSH* (Patient not taking: Reported on 01/12/2019) 90 capsule 0     Patient Stressors:  Medication change or noncompliance Other: Patient states "I don't know" that's the problem  Patient Strengths: Motivation for treatment/growth Religious Affiliation  Treatment Modalities: Medication Management, Group therapy, Case management,  1 to 1 session with clinician, Psychoeducation, Recreational therapy.   Physician Treatment Plan for Primary Diagnosis: Schizoaffective disorder, bipolar type (HCC) Long Term Goal(s): Improvement in symptoms so as ready for discharge Improvement in symptoms so as ready for discharge   Short Term Goals: Ability to verbalize feelings will improve Ability to demonstrate self-control will improve Ability to maintain clinical measurements within normal limits will improve Compliance with prescribed medications will improve  Medication Management: Evaluate patient's response, side effects, and tolerance of medication regimen.  Therapeutic Interventions: 1 to 1 sessions, Unit Group sessions and Medication administration.  Evaluation of Outcomes: Not Progressing  Physician Treatment Plan for Secondary Diagnosis: Principal Problem:   Schizoaffective disorder, bipolar type (HCC) Active Problems:   Hypertension   Schizophrenia (HCC)  Long Term Goal(s): Improvement in symptoms so as ready for discharge Improvement in symptoms so as ready for discharge   Short Term Goals: Ability to verbalize feelings will improve Ability to demonstrate self-control will improve Ability to maintain clinical measurements within normal limits will improve Compliance with prescribed medications will improve     Medication Management: Evaluate patient's response, side effects, and tolerance of medication regimen.  Therapeutic Interventions: 1 to 1 sessions, Unit Group sessions and Medication administration.  Evaluation of Outcomes: Not Progressing   RN Treatment Plan for Primary Diagnosis: Schizoaffective disorder, bipolar type (HCC) Long Term Goal(s): Knowledge  of disease and therapeutic regimen to maintain health will improve  Short Term Goals: Ability to verbalize frustration and anger appropriately will improve, Ability to demonstrate self-control, Ability to participate in decision making will improve, Ability to verbalize feelings will improve and Ability to disclose and discuss suicidal ideas  Medication Management: RN will administer medications as ordered by provider, will assess and evaluate patient's response and provide education to patient for prescribed medication. RN will report any adverse and/or side effects to prescribing provider.  Therapeutic Interventions: 1 on 1 counseling sessions, Psychoeducation, Medication administration, Evaluate responses to treatment, Monitor vital signs and CBGs as ordered, Perform/monitor CIWA, COWS, AIMS and Fall Risk screenings as ordered, Perform wound care treatments as ordered.  Evaluation of Outcomes: Not Progressing   LCSW Treatment Plan for Primary Diagnosis: Schizoaffective disorder, bipolar type (HCC) Long Term Goal(s): Safe transition to appropriate next level of care at discharge, Engage patient in therapeutic group addressing interpersonal concerns.  Short Term Goals: Engage patient in aftercare planning with referrals and resources, Increase  social support, Increase ability to appropriately verbalize feelings, Increase emotional regulation and Facilitate acceptance of mental health diagnosis and concerns  Therapeutic Interventions: Assess for all discharge needs, 1 to 1 time with Social worker, Explore available resources and support systems, Assess for adequacy in community support network, Educate family and significant other(s) on suicide prevention, Complete Psychosocial Assessment, Interpersonal group therapy.  Evaluation of Outcomes: Not Progressing   Progress in Treatment: Attending groups: Yes. Participating in groups: Yes. Taking medication as prescribed: Yes. Toleration  medication: Yes. Family/Significant other contact made: Yes, individual(s) contacted:  SPE completed with guardian Delton See Patient understands diagnosis: No. Discussing patient identified problems/goals with staff: Yes. Medical problems stabilized or resolved: Yes. Denies suicidal/homicidal ideation: Yes. Issues/concerns per patient self-inventory: No. Other: none  New problem(s) identified: No, Describe:  none  New Short Term/Long Term Goal(s):  elimination of symptoms of psychosis, medication management for mood stabilization; elimination of SI thoughts; development of comprehensive mental wellness plan.  Patient Goals:  "help"  Discharge Plan or Barriers: Patient remains intrusive on the unit and getting into the face of other patients faces. Patient makes sexual comments to others and has flashed others with his genitals.   Reason for Continuation of Hospitalization: Aggression Anxiety Depression Medication stabilization Suicidal ideation  Estimated Length of Stay: 1-5 days   Recreational Therapy: Patient Stressors: N/A  Patient Goal: Patient will identify 3 positive coping skills strategies to use post d/c within 5 recreation therapy group  sessions  Attendees:  Patient: Jonathan Alexander 01/15/2019 3:09 PM  Physician: Dr. Weber Cooks, MD 01/15/2019 3:09 PM  Nursing: Jerry Caras, RN 01/15/2019 3:09 PM  RN Care Manager: 01/15/2019 3:09 PM  Social Worker: Assunta Curtis, LCSW 01/15/2019 3:09 PM  Recreational Therapist: Roanna Epley, Reather Converse, LRT 01/15/2019 3:09 PM  Other: Evalina Field, LCSW 01/15/2019 3:09 PM  Other:  01/15/2019 3:09 PM  Other: 01/15/2019 3:09 PM      Scribe for Treatment Team: Rozann Lesches, LCSW 01/15/2019 3:19 PM

## 2019-01-15 NOTE — Plan of Care (Signed)
Patient is alert X 2. Denies SI, HI and AVH. Patient very sexually inappropriate on the unit towards staff and other patients. Patient must be redirected and instructed on rules and regulations on the unit. Patient asking male peers and staff to "suck toes, and kiss him." Patient verbalized understanding and consequence of being placed on isolation if patient continues with inappropriate behaviour .

## 2019-01-15 NOTE — [facility] Group Notes (Signed)
LCSW Group Therapy Note  01/15/2019 1:00 PM  Type of Therapy and Topic:  Group Therapy:  Feelings around Relapse and Recovery  Participation Level:  None   Description of Group:    Patients in this group will discuss emotions they experience before and after a relapse. They will process how experiencing these feelings, or avoidance of experiencing them, relates to having a relapse. Facilitator will guide patients to explore emotions they have related to recovery. Patients will be encouraged to process which emotions are more powerful. They will be guided to discuss the emotional reaction significant others in their lives may have to their relapse or recovery. Patients will be assisted in exploring ways to respond to the emotions of others without this contributing to a relapse.  Therapeutic Goals: 1. Patient will identify two or more emotions that lead to a relapse for them 2. Patient will identify two emotions that result when they relapse 3. Patient will identify two emotions related to recovery 4. Patient will demonstrate ability to communicate their needs through discussion and/or role plays   Summary of Patient Progress: Patient was present for group, however did not engage in group discussion beyond identifying people feel happiness at recovery. Patient talked to himself throughout group and appeared to be responding to internal stimuli. CSW notes that patient was observed to refer to himself in the third person and say "Jesus" a lot.  Patient interrupted group to tell another group members "I am mad at you for not letting me suck your toes.  I want to lick your butt hole".  Patient again interrupted group to "praise Jesus" and then later stated "F*ck Jesus!"    Therapeutic Modalities:   Cognitive Behavioral Therapy Solution-Focused Therapy Assertiveness Training Relapse Prevention Therapy   Assunta Curtis, MSW, LCSW 01/15/2019 12:47 PM

## 2019-01-15 NOTE — Progress Notes (Signed)
Recreation Therapy Notes  INPATIENT RECREATION THERAPY ASSESSMENT  Patient Details Name: Jonathan Alexander. MRN: [id number] DOB: Feb 29, 1986 Today's Date: 01/15/2019       Information Obtained From: Patient  Able to Participate in Assessment/Interview: Yes  Patient Presentation: Responsive(Thought blocking)  Reason for Admission (Per Patient): Active Symptoms, Suicidal Ideation  Patient Stressors:    Coping Skills:   Other (Comment)(Thinking of good times)  Leisure Interests (2+):  Music - Listen(Help people)  Frequency of Recreation/Participation: Weekly  Awareness of Community Resources:     [employer]:     Current Use:    If no, Barriers?:    Expressed Interest in [place] of Residence:  Insurance underwriter  Patient Main Form of Transportation: Other (Comment)(Group home)  Patient Strengths:  helping others  Patient Identified Areas of Improvement:  wisdom  Patient Goal for Hospitalization:  wisdom  Current SI (including self-harm):  No  Current HI:  Yes(Im no happy im in pain)  Current AVH: No  Staff Intervention Plan: Group Attendance, Collaborate with Interdisciplinary Treatment Team  Consent to Intern Participation: N/A  Danie Hannig 01/15/2019, 12:03 PM
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
## 2019-01-15 NOTE — Progress Notes (Signed)
Recreation Therapy Notes  Date: 01/15/2019  Time: 9:30 am  Location: Craft room  Behavioral response: Appropriate  Intervention Topic: Problem Solving  Discussion/Intervention:  Group content on today was focused on problem solving. The group described what problem solving is. Patients expressed how problems affect them and how they deal with problems. Individuals identified healthy ways to deal with problems. Patients explained what normally happens to them when they do not deal with problems. The group expressed reoccurring problems for them. The group participated in the intervention "Ways to Solve problems" where patients were given a chance to explore different ways to solve problems.  Clinical Observations/Feedback:  Patient came to group and defined problem solving as wisdom. Individual was social with peers and staff while participating in the intervention. Carlous Olivares LRT/CTRS         Artha Chiasson 01/15/2019 11:15 AM

## 2019-01-15 NOTE — [facility] Counselor (Signed)
CSW notes that she observed the patient in a male patients face and personal space demanding the patient answer his question "do you think I'm ugly".  CSW intervened and attempted to redirect the patient.  Patient was difficult to redirect.  CSW attempted to redirect patient to outside where other patients were but patient demanded that the male patient go with him. CSW was eventually able to redirect the patient and escorted him outside and made the [facility] aware of his behaviors.  CSW asked that behaviors be monitored when around other male patients.   CSW made the nurse and physician aware.  CSW notes that the male patient was very upset.  Assunta Curtis, MSW, LCSW 01/15/2019 3:25 PM

## 2019-01-15 NOTE — Plan of Care (Signed)
  Problem: Health Behavior/Discharge Planning: Goal: Compliance with therapeutic regimen will improve Outcome: Progressing  Patient is compliant with  therapeutic regimen

## 2019-01-16 NOTE — [facility] Group Notes (Signed)
LCSW Group Therapy Note   01/16/2019 1:15pm   Type of Therapy and Topic:  Group Therapy:  Trust and Honesty  Participation Level:  Did Not Attend  Description of Group:    In this group patients will be asked to explore the value of being honest.  Patients will be guided to discuss their thoughts, feelings, and behaviors related to honesty and trusting in others. Patients will process together how trust and honesty relate to forming relationships with peers, family members, and self. Each patient will be challenged to identify and express feelings of being vulnerable. Patients will discuss reasons why people are dishonest and identify alternative outcomes if one was truthful (to self or others). This group will be process-oriented, with patients participating in exploration of their own experiences, giving and receiving support, and processing challenge from other group members.   Therapeutic Goals: 1. Patient will identify why honesty is important to relationships and how honesty overall affects relationships.  2. Patient will identify a situation where they lied or were lied too and the  feelings, thought process, and behaviors surrounding the situation 3. Patient will identify the meaning of being vulnerable, how that feels, and how that correlates to being honest with self and others. 4. Patient will identify situations where they could have told the truth, but instead lied and explain reasons of dishonesty.   Summary of Patient Progress Pt was invited to attend group but chose not to attend. CSW will continue to encourage pt to attend group throughout their admission.     Therapeutic Modalities:   Cognitive Behavioral Therapy Solution Focused Therapy Motivational Interviewing Brief Therapy  Amairani Shuey  CUEBAS-COLON, LCSW 01/16/2019 4:01 PM

## 2019-01-16 NOTE — Plan of Care (Signed)
  Problem: Health Behavior/Discharge Planning: Goal: Compliance with therapeutic regimen will improve Outcome: Progressing  Patient is compliant with  prescribed medication regimen

## 2019-01-16 NOTE — Plan of Care (Signed)
Patient is alert and oriented X 2, denies SI, HI and AVH. Patient denies auditory hallucinations although seen talking to himself; responding to internal stimuli. Patient affect can be labile, inappropriate behavior on the unit toward male peers and staff. Today patient seems to be very flat and not as animated as yesterday. Patient takes medication appropriately and verbalized understanding of ECT on Monday. Safety checks Q 15 minutes to continue. Problem: Education: Goal: Knowledge of [facility] Education information/materials will improve Outcome: Progressing Goal: Emotional status will improve Outcome: Progressing Goal: Mental status will improve Outcome: Progressing Goal: Verbalization of understanding the information provided will improve Outcome: Progressing   Problem: Activity: Goal: Interest or engagement in activities will improve Outcome: Progressing Goal: Sleeping patterns will improve Outcome: Progressing

## 2019-01-16 NOTE — Progress Notes (Signed)
Patient alert and oriented x 4, affect is flat he appears less anxious, thoughts are disorganized and incoherent, he denies SI/HI/AVH but appears responding to internal stimuli. Patient is sexually inappropriate, saying explicit sexual acts to peers and staff. Patient was frequently redirected and reoriented to the values of the unit and he was advised not to use such languages. Patient was offered support and encouragement no distress noted, 15 minutes safety checks maintained will continue to monitor

## 2019-01-16 NOTE — Progress Notes (Deleted)
4-5 pm: Patient walked down to the dinning room and ate dinner with peers on the unit. Patient returned to his room; laid down in the bed. Nurse tech within reach.  5PM -6PM Patient laying down in bed, eyes closed, breaths even and unlabored, no distress noted. Nurse Tech within reach.  6PM-7pm: Patient continued to rest, no distressed noted, safety maintained.

## 2019-01-16 NOTE — Progress Notes (Signed)
[facility] MD Progress Note  01/16/2019 9:03 AM Jonathan Alexander.  MRN:  [id number] Subjective: Patient is a 33 year old male with a past psychiatric history significant for schizoaffective disorder.  He was admitted on 6/11 when he presented to the emergency department on his own initiative from his group home.  Objective: Patient is seen and examined.  Patient is a 33 year old male with the above-stated past psychiatric history who is seen in follow-up.  He is pacing around today.  He is not really answering questions thoroughly.  He stated he does not know what medications he takes, but he does know he lives in a group home.  He denied auditory or visual hallucinations, but during the interview appear to be responding to internal stimuli.  Nursing notes reflect that he had been sexually inappropriate, saying explicit sexual acts to peers and staff.  The patient had to be redirected.  He does not really answer questions when asked about this.  He remains on clonazepam, clozapine, desmopressin, Tofranil, lithium carbonate, metformin, Zyprexa, Effexor extended release.  His vital signs are stable, but he is tacky at 111.  He slept 5 hours last night.  Lithium level on admission was 0.52.  TSH was not in the chart.  His creatinine on 6/9 was 1.12.  Hemoglobin A1c was not in the chart.  He denied any suicidal or homicidal ideation.  Principal Problem: Schizoaffective disorder, bipolar type ([place]) Diagnosis: Principal Problem:   Schizoaffective disorder, bipolar type ([place]) Active Problems:   Hypertension   Schizophrenia ([place])  Total Time spent with patient: 15 minutes  Past Psychiatric History: See admission H&P  Past Medical History:  Past Medical History:  Diagnosis Date  . Schizophrenia ([place])   . Tachycardia    History reviewed. No pertinent surgical history. Family History:  Family History  Family history unknown: Yes   Family Psychiatric  History: See admission H&P Social History:  Social  History   Substance and Sexual Activity  Alcohol Use No     Social History   Substance and Sexual Activity  Drug Use No    Social History   Socioeconomic History  . Marital status: Single    Spouse name: Not on file  . Number of children: Not on file  . Years of education: Not on file  . Highest education level: Not on file  Occupational History  . Not on file  Social Needs  . Financial resource strain: Not on file  . Food insecurity    Worry: Not on file    Inability: Not on file  . Transportation needs    Medical: Not on file    Non-medical: Not on file  Tobacco Use  . Smoking status: Current Every Day Smoker    Packs/day: 1.00    Years: 4.00    Pack years: 4.00    Types: Cigarettes  . Smokeless tobacco: Never Used  Substance and Sexual Activity  . Alcohol use: No  . Drug use: No  . Sexual activity: Never  Lifestyle  . Physical activity    Days per week: Not on file    Minutes per session: Not on file  . Stress: Not on file  Relationships  . Social Herbalist on phone: Not on file    Gets together: Not on file    Attends religious service: Not on file    Active member of club or organization: Not on file    Attends meetings of clubs or organizations: Not on  file    Relationship status: Not on file  Other Topics Concern  . Not on file  Social History Narrative  . Not on file   Additional Social History:    Pain Medications: see PTA Prescriptions: see PTA Over the Counter: see PTA History of alcohol / drug use?: No history of alcohol / drug abuse Negative Consequences of Use: Personal relationships Withdrawal Symptoms: Other (Comment)(none)                    Sleep: Fair  Appetite:  Fair  Current Medications: Current Facility-Administered Medications  Medication Dose Route Frequency Provider Last Rate Last Dose  . acetaminophen (TYLENOL) tablet 650 mg  650 mg Oral Q6H PRN [name], Jacqueline, NP      . alum & mag  hydroxide-simeth (MAALOX/MYLANTA) 200-200-20 MG/5ML suspension 30 mL  30 mL Oral Q4H PRN Thomspon, Adela LankJacqueline, NP      . clonazePAM (KLONOPIN) tablet 1 mg  1 mg Oral TID Clapacs, Jackquline DenmarkJohn T, MD   1 mg at 01/16/19 0839  . cloZAPine (CLOZARIL) tablet 250 mg  250 mg Oral QHS Clapacs, John T, MD   250 mg at 01/15/19 2234  . desmopressin (DDAVP) tablet 0.2 mg  0.2 mg Oral QHS Thomspon, Adela LankJacqueline, NP   0.2 mg at 01/15/19 2235  . EPINEPHrine (EPI-PEN) injection 0.3 mg  0.3 mg Intramuscular UD Thomspon, Jacqueline, NP      . ferrous sulfate tablet 325 mg  325 mg Oral Q breakfast [name], Jacqueline, NP   325 mg at 01/16/19 0839  . imipramine (TOFRANIL) tablet 150 mg  150 mg Oral QHS [name], Jacqueline, NP   150 mg at 01/15/19 2236  . lithium carbonate (ESKALITH) CR tablet 900 mg  900 mg Oral QHS Clapacs, Jackquline DenmarkJohn T, MD   900 mg at 01/15/19 2234  . loratadine (CLARITIN) tablet 10 mg  10 mg Oral Daily [name], Jacqueline, NP   10 mg at 01/16/19 0839  . magnesium hydroxide (MILK OF MAGNESIA) suspension 30 mL  30 mL Oral Daily PRN [name], Jacqueline, NP      . metFORMIN (GLUCOPHAGE) tablet 500 mg  500 mg Oral BID [name], Adela LankJacqueline, NP   500 mg at 01/16/19 0839  . metoprolol succinate (TOPROL-XL) 24 hr tablet 100 mg  100 mg Oral Daily Clapacs, Jackquline DenmarkJohn T, MD   100 mg at 01/16/19 0839  . OLANZapine (ZYPREXA) tablet 20 mg  20 mg Oral QHS Clapacs, Jackquline DenmarkJohn T, MD   20 mg at 01/15/19 2234  . polyethylene glycol (MIRALAX / GLYCOLAX) packet 17 g  1 packet Oral q morning - 10a [name], Jacqueline, NP   17 g at 01/16/19 0841  . venlafaxine XR (EFFEXOR-XR) 24 hr capsule 75 mg  75 mg Oral Q breakfast [name], Jacqueline, NP   75 mg at 01/16/19 16100839    Lab Results: No results found for this or any previous visit (from the past 48 hour(s)).  Blood Alcohol level:  Lab Results  Component Value Date   ETH <10 01/12/2019   ETH <10 02/09/2018    Metabolic Disorder Labs: Lab Results  Component Value Date   HGBA1C 5.0  02/09/2018   MPG 97 02/09/2018   No results found for: PROLACTIN Lab Results  Component Value Date   CHOL 167 02/09/2018   TRIG 169 (H) 02/09/2018   HDL 38 (L) 02/09/2018   CHOLHDL 4.4 02/09/2018   VLDL 34 02/09/2018   LDLCALC 95 02/09/2018    Physical Findings: AIMS: Facial  and Oral Movements Muscles of Facial Expression: None, normal Lips and Perioral Area: Minimal Jaw: None, normal Tongue: Mild,Extremity Movements Upper (arms, wrists, hands, fingers): Mild Lower (legs, knees, ankles, toes): None, normal, Trunk Movements Neck, shoulders, hips: None, normal, Overall Severity Severity of abnormal movements (highest score from questions above): Mild Incapacitation due to abnormal movements: None, normal Patient's awareness of abnormal movements (rate only patient's report): Aware, no distress, Dental Status Current problems with teeth and/or dentures?: No Does patient usually wear dentures?: No  CIWA:  CIWA-Ar Total: 0 COWS:  COWS Total Score: 1  Musculoskeletal: Strength & Muscle Tone: within normal limits Gait & Station: normal Patient leans: N/A  Psychiatric Specialty Exam: Physical Exam  Nursing note and vitals reviewed. Constitutional: He is oriented to person, place, and time. He appears well-developed and well-nourished.  HENT:  Head: Normocephalic and atraumatic.  Respiratory: Effort normal.  Neurological: He is alert and oriented to person, place, and time.    ROS  Blood pressure 104/61, pulse (!) 111, temperature 98 F (36.7 C), temperature source Oral, resp. rate 18, height 5' 11.7" (1.821 m), weight 95.3 kg, SpO2 100 %.Body mass index is 28.72 kg/m.  General Appearance: Disheveled  Eye Contact:  Minimal  Speech:  Normal Rate  Volume:  Decreased  Mood:  Dysphoric and Irritable  Affect:  Congruent  Thought Process:  Coherent and Descriptions of Associations: Circumstantial  Orientation:  Full (Time, Place, and Person)  Thought Content:   Hallucinations: Auditory Visual  Suicidal Thoughts:  No  Homicidal Thoughts:  No  Memory:  Immediate;   Poor Recent;   Poor Remote;   Poor  Judgement:  Impaired  Insight:  Lacking  Psychomotor Activity:  Decreased  Concentration:  Concentration: Fair and Attention Span: Fair  Recall:  FiservFair  Fund of Knowledge:  Fair  Language:  Fair  Akathisia:  Negative  Handed:  Right  AIMS (if indicated):     Assets:  Desire for Improvement Resilience  ADL's:  Intact  Cognition:  WNL  Sleep:  Number of Hours: 5     Treatment Plan Summary: Daily contact with patient to assess and evaluate symptoms and progress in treatment, Medication management and Plan : Patient is seen and examined.  Patient is a [age] year old male with the above-stated past psychiatric history who is seen in follow-up.   Diagnosis: #1 schizoaffective disorder, #2 hypertension, #3 diabetes mellitus  Patient is seen and examined.  Patient is a 33 year old male with the above-stated past psychiatric history who is seen in follow-up.  The note from yesterday reveals that his clozapine dose was increased on 6/12.  His clonazepam dosage was also increased to 1 mg p.o. 3 times daily for acute control of his behavior.  He had had some episodes of inappropriate behavior with regard to sexual comments.  It is not clear to me at this point whether or not the changes in medications have had any benefit for this.  We will monitor for response to his behavior, and adjust if necessary if there is no improvement.  1.  Continue clonazepam 1 mg p.o. 3 times daily for anxiety and agitation. 2.  Continue clozapine 250 mg p.o. nightly for psychosis, sleep, and mood stability. 3.  Continue desmopressin 0.2 mg p.o. nightly for endocrine reasons. 4.  Continue ferrous sulfate 325 mg p.o. daily with food secondary to anemia. 5.  Continue imipramine 150 mg p.o. nightly for anxiety and depression. 6.  Continue lithium carbonate CR 900 mg p.o. nightly for  mood stability. 7.  Continue Metformin 500 mg p.o. twice daily for glucose control. 8.  Continue Metformin extended release 100 mg p.o. daily for hypertension and tachycardia. 9.  Continue Zyprexa 20 mg p.o. nightly for psychosis and sleep. 10.  Continue MiraLAX 17 g in 8 ounces of water daily for constipation. 11.  Continue venlafaxine extended release 75 mg p.o. daily for anxiety and depression. 12.  Disposition planning-in progress.  Antonieta PertGreg Lawson Coden Franchi, MD 01/16/2019, 9:03 AM

## 2019-01-17 LAB — GLUCOSE, CAPILLARY: Glucose-Capillary: 107 mg/dL — ABNORMAL HIGH (ref 70–99)

## 2019-01-17 NOTE — Progress Notes (Signed)
Patient stated to this writer "why you so sweet, you should only be sweet with me". Patient also stated "will you marry me". This writer expressed to patient that this would be inappropriate and patient proceeds to say "just one kiss".

## 2019-01-17 NOTE — [facility] Group Notes (Signed)
LCSW Group Therapy Note 01/17/2019 1:15pm  Type of Therapy and Topic: Group Therapy: Feelings Around Returning Home & Establishing a Supportive Framework and Supporting Oneself When Supports Not Available  Participation Level: Active  Description of Group:  Patients first processed thoughts and feelings about upcoming discharge. These included fears of upcoming changes, lack of change, new living environments, judgements and expectations from others and overall stigma of mental health issues. The group then discussed the definition of a supportive framework, what that looks and feels like, and how do to discern it from an unhealthy non-supportive network. The group identified different types of supports as well as what to do when your family/friends are less than helpful or unavailable  Therapeutic Goals  1. Patient will identify one healthy supportive network that they can use at discharge. 2. Patient will identify one factor of a supportive framework and how to tell it from an unhealthy network. 3. Patient able to identify one coping skill to use when they do not have positive supports from others. 4. Patient will demonstrate ability to communicate their needs through discussion and/or role plays.  Summary of Patient Progress:  The patient reported he feel "uplifted." Pt engaged during group session. As patients processed their anxiety about discharge and described healthy supports patient shared he is ready to be discharge. He stated, " the devil brought me here." He listed his mom as his main support. Patients identified at least one self-care tool they were willing to use after discharge; music.    Therapeutic Modalities Cognitive Behavioral Therapy Motivational Interviewing   Cece Milhouse  CUEBAS-COLON, LCSW 01/17/2019 11:43 AM

## 2019-01-17 NOTE — Progress Notes (Signed)
[facility]  01/17/2019 9:34 AM Kamdin Horton MarshallSantana Jr.  MRN:  [id number] Subjective:  Patient is a 33 year old male with a past psychiatric history significant for schizoaffective disorder.  He was admitted on 6/11 when he presented to the emergency department on his own initiative from his group home.  Objective: Patient is seen and examined.  Patient is a 33 year old male with the above-stated past psychiatric history who is seen in follow-up.  Yesterday he was very agitated and had a great deal of pacing.  This morning he is lying in bed, but did raise his head out of the sheets to talk.  He remains guarded.  He denied any auditory or visual hallucinations.  He denied any suicidal ideation.  He was able to smile and engage to a degree this a.m.  He previously on examination appeared to be responding to internal stimuli, but that does not appear to be ongoing at this point.  Review of the nursing notes revealed that at least at 4:30 AM he denied any suicidal, homicidal or psychotic symptoms.  It was still felt that he was bizarre, and was flirtatious.  He actually asked the nursing staff whether or not she was afraid of him.  He is tachycardic this morning with a rate of 129.  The rest of his vital signs are stable, and he is afebrile.  Principal Problem: Schizoaffective disorder, bipolar type (HCC) Diagnosis: Principal Problem:   Schizoaffective disorder, bipolar type (HCC) Active Problems:   Hypertension   Schizophrenia (HCC)  Total Time spent with patient: 15 minutes  Past Psychiatric History: See admission H&P  Past Medical History:  Past Medical History:  Diagnosis Date  . Schizophrenia (HCC)   . Tachycardia    History reviewed. No pertinent surgical history. Family History:  Family History  Family history unknown: Yes   Family Psychiatric  History: See admission H&P Social History:  Social History   Substance and Sexual Activity  Alcohol Use No     Social History    Substance and Sexual Activity  Drug Use No    Social History   Socioeconomic History  . Marital status: Single    Spouse name: Not on file  . Number of children: Not on file  . Years of education: Not on file  . Highest education level: Not on file  Occupational History  . Not on file  Social Needs  . Financial resource strain: Not on file  . Food insecurity    Worry: Not on file    Inability: Not on file  . Transportation needs    Medical: Not on file    Non-medical: Not on file  Tobacco Use  . Smoking status: Current Every Day Smoker    Packs/day: 1.00    Years: 4.00    Pack years: 4.00    Types: Cigarettes  . Smokeless tobacco: Never Used  Substance and Sexual Activity  . Alcohol use: No  . Drug use: No  . Sexual activity: Never  Lifestyle  . Physical activity    Days per week: Not on file    Minutes per session: Not on file  . Stress: Not on file  Relationships  . Social Musicianconnections    Talks on phone: Not on file    Gets together: Not on file    Attends religious service: Not on file    Active member of club or organization: Not on file    Attends meetings of clubs or organizations: Not on file  Relationship status: Not on file  Other Topics Concern  . Not on file  Social History Narrative  . Not on file   Additional Social History:    Pain Medications: see PTA Prescriptions: see PTA Over the Counter: see PTA History of alcohol / drug use?: No history of alcohol / drug abuse Negative Consequences of Use: Personal relationships Withdrawal Symptoms: Other (Comment)(none)                    Sleep: Fair  Appetite:  Fair  Current Medications: Current Facility-Administered Medications  Medication Dose Route Frequency Provider Last Rate Last Dose  . acetaminophen (TYLENOL) tablet 650 mg  650 mg Oral Q6H PRN [name], Jacqueline, NP      . alum & mag hydroxide-simeth (MAALOX/MYLANTA) 200-200-20 MG/5ML suspension 30 mL  30 mL Oral Q4H PRN  Thomspon, Adela LankJacqueline, NP      . clonazePAM (KLONOPIN) tablet 1 mg  1 mg Oral TID Clapacs, Jackquline DenmarkJohn T, MD   1 mg at 01/17/19 0852  . cloZAPine (CLOZARIL) tablet 250 mg  250 mg Oral QHS Clapacs, Jackquline DenmarkJohn T, MD   250 mg at 01/16/19 2145  . desmopressin (DDAVP) tablet 0.2 mg  0.2 mg Oral QHS Thomspon, Adela LankJacqueline, NP   0.2 mg at 01/16/19 2146  . EPINEPHrine (EPI-PEN) injection 0.3 mg  0.3 mg Intramuscular UD Thomspon, Jacqueline, NP      . ferrous sulfate tablet 325 mg  325 mg Oral Q breakfast [name], Jacqueline, NP   325 mg at 01/17/19 0852  . imipramine (TOFRANIL) tablet 150 mg  150 mg Oral QHS [name], Jacqueline, NP   150 mg at 01/16/19 2146  . lithium carbonate (ESKALITH) CR tablet 900 mg  900 mg Oral QHS Clapacs, Jackquline DenmarkJohn T, MD   900 mg at 01/16/19 2145  . loratadine (CLARITIN) tablet 10 mg  10 mg Oral Daily [name], Jacqueline, NP   10 mg at 01/17/19 0852  . magnesium hydroxide (MILK OF MAGNESIA) suspension 30 mL  30 mL Oral Daily PRN [name], Jacqueline, NP      . metFORMIN (GLUCOPHAGE) tablet 500 mg  500 mg Oral BID [name], Adela LankJacqueline, NP   500 mg at 01/17/19 0852  . metoprolol succinate (TOPROL-XL) 24 hr tablet 100 mg  100 mg Oral Daily Clapacs, Jackquline DenmarkJohn T, MD   100 mg at 01/17/19 0852  . OLANZapine (ZYPREXA) tablet 20 mg  20 mg Oral QHS Clapacs, Jackquline DenmarkJohn T, MD   20 mg at 01/16/19 2144  . polyethylene glycol (MIRALAX / GLYCOLAX) packet 17 g  1 packet Oral q morning - 10a [name], Jacqueline, NP   17 g at 01/16/19 0841  . venlafaxine XR (EFFEXOR-XR) 24 hr capsule 75 mg  75 mg Oral Q breakfast [name], Jacqueline, NP   75 mg at 01/17/19 [id number]    Lab Results: No results found for this or any previous visit (from the past 48 hour(s)).  Blood Alcohol level:  Lab Results  Component Value Date   ETH <10 01/12/2019   ETH <10 02/09/2018    Metabolic Disorder Labs: Lab Results  Component Value Date   HGBA1C 5.0 02/09/2018   MPG 97 02/09/2018   No results found for: PROLACTIN Lab Results   Component Value Date   CHOL 167 02/09/2018   TRIG 169 (H) 02/09/2018   HDL 38 (L) 02/09/2018   CHOLHDL 4.4 02/09/2018   VLDL 34 02/09/2018   LDLCALC 95 02/09/2018    Physical Findings: AIMS: Facial and Oral Movements Muscles  of Facial Expression: None, normal Lips and Perioral Area: Minimal Jaw: None, normal Tongue: Mild,Extremity Movements Upper (arms, wrists, hands, fingers): Mild Lower (legs, knees, ankles, toes): None, normal, Trunk Movements Neck, shoulders, hips: None, normal, Overall Severity Severity of abnormal movements (highest score from questions above): Mild Incapacitation due to abnormal movements: None, normal Patient's awareness of abnormal movements (rate only patient's report): Aware, no distress, Dental Status Current problems with teeth and/or dentures?: No Does patient usually wear dentures?: No  CIWA:  CIWA-Ar Total: 0 COWS:  COWS Total Score: 1  Musculoskeletal: Strength & Muscle Tone: within normal limits Gait & Station: normal Patient leans: N/A  Psychiatric Specialty Exam: Physical Exam  Nursing note and vitals reviewed. Constitutional: He is oriented to person, place, and time. He appears well-developed and well-nourished.  HENT:  Head: Normocephalic and atraumatic.  Respiratory: Effort normal.  Neurological: He is alert and oriented to person, place, and time.    ROS  Blood pressure 109/77, pulse (!) 129, temperature 97.9 F (36.6 C), temperature source Oral, resp. rate 18, height 5' 11.7" (1.821 m), weight 95.3 kg, SpO2 100 %.Body mass index is 28.72 kg/m.  General Appearance: Disheveled  Eye Contact:  Minimal  Speech:  Normal Rate  Volume:  Decreased  Mood:  Dysphoric  Affect:  Flat  Thought Process:  Goal Directed and Descriptions of Associations: Circumstantial  Orientation:  Full (Time, Place, and Person)  Thought Content:  Paranoid Ideation  Suicidal Thoughts:  No  Homicidal Thoughts:  No  Memory:  Immediate;   Fair Recent;    Fair Remote;   Fair  Judgement:  Impaired  Insight:  Lacking  Psychomotor Activity:  Psychomotor Retardation  Concentration:  Concentration: Fair and Attention Span: Fair  Recall:  FiservFair  Fund of Knowledge:  Fair  Language:  Fair  Akathisia:  Negative  Handed:  Right  AIMS (if indicated):     Assets:  Desire for Improvement Resilience  ADL's:  Impaired  Cognition:  WNL  Sleep:  Number of Hours: 7.25     Treatment Plan Summary: Daily contact with patient to assess and evaluate symptoms and progress in treatment, Medication management and Plan : Patient is a 33 year old male with the above-stated past psychiatric history who is seen in follow-up.   Diagnosis: #1 schizoaffective disorder, #2 hypertension, #3 diabetes mellitus  Patient is seen and examined.  Patient is a 33 year old male with the above-stated past psychiatric history who is seen in follow-up.  He appears to be a little bit less aggressive today than yesterday.  He had a lot of pacing behaviors, and this morning he is lying in bed and at least able to smile and engage at one point.  His Klonopin dosage had recently been increased as well as his his Clozaril dosage.  No change in his current medications.  He is diabetic, and his last blood sugar was 105 on admission.  We will check that just to make sure he is not getting hypoglycemic.  He is tachycardic.  His DDAVP would normally decrease heart rate, and as well the patient is on metoprolol XL.  I will asked the nurses to make sure that he is compliant with his oral medications, and will order another EKG to assess.   1.  Continue clonazepam 1 mg p.o. 3 times daily for anxiety and agitation. 2.  Continue clozapine 250 mg p.o. nightly for psychosis, sleep, and mood stability. 3.  Continue desmopressin 0.2 mg p.o. nightly for endocrine reasons. 4.  Continue ferrous sulfate 325 mg p.o. daily with food secondary to anemia. 5.  Continue imipramine 150 mg p.o. nightly for  anxiety and depression. 6.  Continue lithium carbonate CR 900 mg p.o. nightly for mood stability. 7.  Continue Metformin 500 mg p.o. twice daily for glucose control. 8.  Continue Metformin extended release 100 mg p.o. daily for hypertension and tachycardia. 9.  Continue Zyprexa 20 mg p.o. nightly for psychosis and sleep. 10.  Continue MiraLAX 17 g in 8 ounces of water daily for constipation. 11.  Continue venlafaxine extended release 75 mg p.o. daily for anxiety and depression. 12.  Make sure nursing observes his intake of oral medications for possible diversion. 13.  Check blood sugar this a.m. 14.  EKG to assess tachycardia. 15.  Disposition planning-in progress.  Sharma Covert, MD 01/17/2019, 9:34 AM

## 2019-01-17 NOTE — Progress Notes (Signed)
Patient just stated to this writer "I'm mad at you, when you gone let me lick your put hole". This Probation officer explained to patient that this behavior is inappropriate and patient stated "just one time".

## 2019-01-17 NOTE — Plan of Care (Signed)
  Problem: Education: Goal: Knowledge of [facility] Education information/materials will improve Outcome: Progressing Goal: Emotional status will improve Outcome: Progressing Goal: Mental status will improve Outcome: Progressing Goal: Verbalization of understanding the information provided will improve Outcome: Progressing   D: Pt has been isolative to room. Denies SI, HI and AVH. Affect is bizarre. Patient is flirtatious. When asked to keep his distance, patient asks if this Pryor Curia is afraid of him. Medication compliant. Contracting for safety. Denies complaints. A: Continue to monitor for safety. R: Safety maintained.

## 2019-01-17 NOTE — Plan of Care (Signed)
D- Patient alert and oriented. Patient presented in a pleasant, but drowsy mood on assessment stating that he slept ok last night, however he was still tired this morning. Patient endorsed "a little bit" of depression and denied any signs/symptoms of anxiety. When this writer asked patient what was making him feel this way patient responded "satisfaction" and then stated "nevermind". Patient is very hypersexual and sexually inappropriate with staff. Patient denies SI, HI, AVH, and pain at this time. Patient had no stated goals for today.  A- Scheduled medications administered to patient, per MD orders. Support and encouragement provided.  Routine safety checks conducted every 15 minutes.  Patient informed to notify staff with problems or concerns.  R- No adverse drug reactions noted. Patient contracts for safety at this time. Patient compliant with medications and treatment plan. Patient receptive, calm, and cooperative. Patient interacts well with others on the unit.  Patient remains safe at this time.  Problem: Education: Goal: Knowledge of [facility] Education information/materials will improve Outcome: Progressing Goal: Emotional status will improve Outcome: Progressing Goal: Mental status will improve Outcome: Progressing Goal: Verbalization of understanding the information provided will improve Outcome: Progressing   Problem: Activity: Goal: Interest or engagement in activities will improve Outcome: Progressing Goal: Sleeping patterns will improve Outcome: Progressing   Problem: Coping: Goal: Coping ability will improve Outcome: Progressing Goal: Will verbalize feelings Outcome: Progressing   Problem: Health Behavior/Discharge Planning: Goal: Ability to make decisions will improve Outcome: Progressing Goal: Compliance with therapeutic regimen will improve Outcome: Progressing   Problem: Safety: Goal: Ability to disclose and discuss suicidal ideas will  improve Outcome: Progressing Goal: Ability to identify and utilize support systems that promote safety will improve Outcome: Progressing

## 2019-01-17 NOTE — Progress Notes (Signed)
D: Pt has been isolative to room. Denies SI, HI and AVH. Affect is bizarre. Patient is flirtatious. When asked to keep his distance, patient asks if this Pryor Curia is afraid of him. Medication compliant. Contracting for safety. Denies complaints. A: Continue to monitor for safety. R: Safety maintained.

## 2019-01-17 NOTE — Progress Notes (Signed)
Patient continues to be sexually inappropriate. This Probation officer was in the dayroom, assisting another patient, when patient called this Probation officer over and said "let me lick your butt-hole". Patient is extremely obsessed and preoccupied with asking every male staff member on the unit about giving him a kiss and wanting to lick their butt-hole.

## 2019-01-18 ENCOUNTER — Inpatient Hospital Stay: Payer: Medicare Other | Admitting: Anesthesiology

## 2019-01-18 LAB — CLOZAPINE (CLOZARIL)
Clozapine Lvl: 92 ng/mL — ABNORMAL LOW (ref 350–650)
NorClozapine: 77 ng/mL
Total(Cloz+Norcloz): 169 ng/mL

## 2019-01-18 MED ORDER — SUCCINYLCHOLINE CHLORIDE 20 MG/ML IJ SOLN
INTRAMUSCULAR | Status: AC
  Filled 2019-01-18: qty 1

## 2019-01-18 MED ORDER — MIDAZOLAM HCL 2 MG/2ML IJ SOLN
INTRAMUSCULAR | Status: DC | PRN
  Administered 2019-01-18: 1 mg via INTRAVENOUS

## 2019-01-18 MED ORDER — MIDAZOLAM HCL 2 MG/2ML IJ SOLN
INTRAMUSCULAR | Status: AC
  Filled 2019-01-18: qty 2

## 2019-01-18 MED ORDER — METHOHEXITAL SODIUM 100 MG/10ML IV SOSY
PREFILLED_SYRINGE | INTRAVENOUS | Status: DC | PRN
  Administered 2019-01-18: 70 mg via INTRAVENOUS

## 2019-01-18 MED ORDER — SUCCINYLCHOLINE CHLORIDE 200 MG/10ML IV SOSY
PREFILLED_SYRINGE | INTRAVENOUS | Status: DC | PRN
  Administered 2019-01-18: 100 mg via INTRAVENOUS

## 2019-01-18 MED ORDER — SODIUM CHLORIDE 0.9 % IV SOLN
500.0000 mL | Freq: Once | INTRAVENOUS | Status: AC
  Administered 2019-01-18: 10:00:00 via INTRAVENOUS

## 2019-01-18 MED ORDER — METHOHEXITAL SODIUM 0.5 G IJ SOLR
INTRAMUSCULAR | Status: AC
  Filled 2019-01-18: qty 500

## 2019-01-18 MED ORDER — CLOZAPINE 100 MG PO TABS
300.0000 mg | ORAL_TABLET | Freq: Every day | ORAL | Status: DC
  Administered 2019-01-18 – 2019-01-20 (×3): 300 mg via ORAL
  Filled 2019-01-18 (×3): qty 3

## 2019-01-18 NOTE — Anesthesia Procedure Notes (Signed)
Performed by: Serenna Deroy, CRNA Pre-anesthesia Checklist: Patient identified, Emergency Drugs available, Suction available and Patient being monitored Patient Re-evaluated:Patient Re-evaluated prior to induction Oxygen Delivery Method: Circle system utilized Preoxygenation: Pre-oxygenation with 100% oxygen Induction Type: IV induction Ventilation: Mask ventilation without difficulty and Mask ventilation throughout procedure Airway Equipment and Method: Bite block Placement Confirmation: positive ETCO2 Dental Injury: Teeth and Oropharynx as per pre-operative assessment        

## 2019-01-18 NOTE — Plan of Care (Signed)
  Problem: Education: Goal: Knowledge of [facility] Education information/materials will improve Outcome: Progressing Goal: Emotional status will improve Outcome: Progressing Goal: Mental status will improve Outcome: Progressing Goal: Verbalization of understanding the information provided will improve Outcome: Progressing  D: Patient stated "my balls hurt". Instructed patient this Pryor Curia could not help him with that. Denies SI, HI and AVH. States "I've got a line for you. [facility] everywhere" Bizarre affect. Contracting for safety. Cooperative. Med compliant. A: Continue to monitor for safety. R: Safety maintained.

## 2019-01-18 NOTE — [facility] Group Notes (Signed)
LCSW Group Therapy Note   01/18/2019 12:58 PM   Type of Therapy and Topic:  Group Therapy:  Overcoming Obstacles   Participation Level:  Minimal   Description of Group:    In this group patients will be encouraged to explore what they see as obstacles to their own wellness and recovery. They will be guided to discuss their thoughts, feelings, and behaviors related to these obstacles. The group will process together ways to cope with barriers, with attention given to specific choices patients can make. Each patient will be challenged to identify changes they are motivated to make in order to overcome their obstacles. This group will be process-oriented, with patients participating in exploration of their own experiences as well as giving and receiving support and challenge from other group members.   Therapeutic Goals: 1. Patient will identify personal and current obstacles as they relate to admission. 2. Patient will identify barriers that currently interfere with their wellness or overcoming obstacles.  3. Patient will identify feelings, thought process and behaviors related to these barriers. 4. Patient will identify two changes they are willing to make to overcome these obstacles:      Summary of Patient Progress Pt came to group at the end and was not present for much of the group discussion. Pt participated to the best of his ability, but his comments were not on topic and did not make much sense. Pt discussed confusion being a sin because it does not make you happy to be confused.     Therapeutic Modalities:   Cognitive Behavioral Therapy Solution Focused Therapy Motivational Interviewing Relapse Prevention Therapy  Evalina Field, MSW, LCSW Clinical Social Work 01/18/2019 12:58 PM

## 2019-01-18 NOTE — Anesthesia Post-op Follow-up Note (Signed)
Anesthesia QCDR form completed.        

## 2019-01-18 NOTE — [facility] Counselor (Signed)
CSW contacted legal guardian to check on receipt of releases and if signed copies had been returned.  CSW was informed that he must have overlooked them.  CSW provided email address for him to send the requested information.  Delton See, legal guardian [phone]  Assunta Curtis, MSW, LCSW 01/18/2019 9:29 AM

## 2019-01-18 NOTE — H&P (Signed)
Jonathan Horton MarshallSantana Jr. is an 33 y.o. male.   None feels great. Manic Chief Complaint: None. Feels great. Manic HPI: schizoaffective mania  Past Medical History:  Diagnosis Date  . Schizophrenia (HCC)   . Tachycardia     History reviewed. No pertinent surgical history.  Family History  Family history unknown: Yes   Social History:  reports that he has been smoking cigarettes. He has a 4.00 pack-year smoking history. He has never used smokeless tobacco. He reports that he does not drink alcohol or use drugs.  Allergies:  Allergies  Allergen Reactions  . Divalproex Sodium   . Shellfish-Derived Products     Medications Prior to Admission  Medication Sig Dispense Refill  . clonazePAM (KLONOPIN) 1 MG tablet TAKE ONE TABLET BY MOUTH AT BEDTIME 30 tablet 4  . clozapine (CLOZARIL) 200 MG tablet TAKE ONE TABLET (200MG ) BY MOUTH EVERY MORNING TAKE 4 TABS (800MG ) BYMOUTH AT BEDTIME. 70 tablet 10  . desmopressin (DDAVP) 0.2 MG tablet Take 1 tablet (0.2 mg total) by mouth at bedtime. 30 tablet 0  . EPINEPHrine (EPIPEN 2-PAK) 0.3 mg/0.3 mL IJ SOAJ injection Inject 0.3 mg into the muscle as directed.    . ferrous sulfate 325 (65 FE) MG tablet Take 1 tablet (325 mg total) by mouth daily with breakfast. 30 tablet 0  . imipramine (TOFRANIL) 50 MG tablet TAKE 3 TABLETS BY MOUTH AT BEDTIME 90 tablet 10  . lithium carbonate (LITHOBID) 300 MG CR tablet TAKE TWO TABLETS BY MOUTH AT BEDTIME 60 tablet 0  . loratadine (CLARITIN) 10 MG tablet Take 1 tablet (10 mg total) by mouth daily. 30 tablet 0  . metFORMIN (GLUCOPHAGE) 500 MG tablet Take 1 tablet (500 mg total) by mouth 2 (two) times daily with a meal. 60 tablet 0  . metoprolol succinate (TOPROL-XL) 100 MG 24 hr tablet Take 150 mg by mouth daily.    Marland Kitchen. OLANZapine (ZYPREXA) 15 MG tablet Take 1 tablet (15 mg total) by mouth at bedtime. 30 tablet 0  . polyethylene glycol powder (GLYCOLAX/MIRALAX) powder MIX 17GM INTO 4-8 OUNCES OF FLUID, THEN TAKE BY MOUTH  EVERY MORNING 510 g 0  . venlafaxine XR (EFFEXOR-XR) 75 MG 24 hr capsule TAKE 3 CAPSULES (225MG ) BY MOUTH EVERY DAY WITH BREAKFAST *DO NOT CRUSH* 90 capsule 0    Results for orders placed or performed during the hospital encounter of 01/13/19 (from the past 48 hour(s))  Glucose, capillary     Status: Abnormal   Collection Time: 01/17/19 10:34 AM  Result Value Ref Range   Glucose-Capillary 107 (H) 70 - 99 mg/dL   No results found.  Review of Systems  Constitutional: Negative.   HENT: Negative.   Eyes: Negative.   Respiratory: Negative.   Cardiovascular: Negative.   Gastrointestinal: Negative.   Musculoskeletal: Negative.   Skin: Negative.   Neurological: Negative.   Psychiatric/Behavioral: Negative.     Blood pressure 123/84, pulse (!) 112, temperature 97.8 F (36.6 C), temperature source Oral, resp. rate 18, height 5' 11.7" (1.821 m), weight 95.3 kg, SpO2 100 %. Physical Exam  Nursing note and vitals reviewed. Constitutional: He appears well-developed and well-nourished.  HENT:  Head: Normocephalic and atraumatic.  Eyes: Pupils are equal, round, and reactive to light. Conjunctivae are normal.  Neck: Normal range of motion.  Cardiovascular: Regular rhythm and normal heart sounds.  Respiratory: Effort normal.  GI: Soft.  Musculoskeletal: Normal range of motion.  Neurological: He is alert.  Skin: Skin is warm and dry.  Psychiatric:  His affect is inappropriate. His speech is tangential. He is agitated. He is not aggressive. Thought content is not paranoid. Cognition and memory are impaired. He expresses impulsivity and inappropriate judgment. He expresses no homicidal and no suicidal ideation.     Assessment/Plan Start 3xweek ECT as part of inpatient treatment  Alethia Berthold, MD 01/18/2019, 9:55 AM

## 2019-01-18 NOTE — Progress Notes (Signed)
Patient continues to be extremely inappropriate with this Probation officer. This writer went to get patient to administer medications and he motioned to this Probation officer to come closer to him and patient stated "when are you going to let me lick your butt-hole, I won't tell anybody". Patient proceeds to say to this writer "let me suck on your toes". This Probation officer redirected patient to come and get his medication and that this behavior was unacceptable. Patient comes into the medication room and says "I'm mad at you" and when this writer asks patient why he feels this way, patient states "because you won't let me lick your butt-hole". This Probation officer asked patient what is his obsession with this and he states "just yours".

## 2019-01-18 NOTE — Progress Notes (Signed)
Recreation Therapy Notes   Date: 01/18/2019  Time: 9:30 am   Location: Craft room   Behavioral response: N/A   Intervention Topic: Necessities  Discussion/Intervention: Patient did not attend group.   Clinical Observations/Feedback:  Patient did not attend group.   Yoshito Gaza LRT/CTRS        Courtney Fenlon 01/18/2019 10:20 AM

## 2019-01-18 NOTE — Progress Notes (Signed)
D: Patient stated "my balls hurt". Instructed patient this Pryor Curia could not help him with that. Denies SI, HI and AVH. States "I've got a line for you. [facility] everywhere" Bizarre affect. Contracting for safety. Cooperative. Med compliant. A: Continue to monitor for safety. R: Safety maintained.

## 2019-01-18 NOTE — Anesthesia Postprocedure Evaluation (Signed)
Anesthesia Post Note  Patient: Jonathan Alexander.  Procedure(s) Performed: ECT TX  Patient location during evaluation: PACU Anesthesia Type: General Level of consciousness: awake and alert Pain management: pain level controlled Vital Signs Assessment: post-procedure vital signs reviewed and stable Respiratory status: spontaneous breathing, nonlabored ventilation and respiratory function stable Cardiovascular status: blood pressure returned to baseline and stable Postop Assessment: no signs of nausea or vomiting Anesthetic complications: no     Last Vitals:  Vitals:   01/18/19 1043 01/18/19 1053  BP: 119/80 121/78  Pulse: (!) 102 (!) 104  Resp: 17 16  Temp:  (!) 36.2 C  SpO2: 99% 100%    Last Pain:  Vitals:   01/18/19 1053  TempSrc:   PainSc: 0-No pain                 Tirsa Gail

## 2019-01-18 NOTE — Anesthesia Preprocedure Evaluation (Signed)
Anesthesia Evaluation  Patient identified by MRN, date of birth, ID band Patient awake    Reviewed: Allergy & Precautions, NPO status , Patient's Chart, lab work & pertinent test results, reviewed documented beta blocker date and time   History of Anesthesia Complications Negative for: history of anesthetic complications  Airway Mallampati: II  TM Distance: >3 FB     Dental  (+) Chipped   Pulmonary neg sleep apnea, neg COPD, Current Smoker, former smoker,    breath sounds clear to auscultation- rhonchi (-) wheezing      Cardiovascular Exercise Tolerance: Good hypertension, (-) CAD, (-) Past MI and (-) Cardiac Stents  Rhythm:Regular Rate:Normal - Systolic murmurs and - Diastolic murmurs    Neuro/Psych PSYCHIATRIC DISORDERS Bipolar Disorder Schizophrenia negative neurological ROS     GI/Hepatic negative GI ROS, Neg liver ROS,   Endo/Other  diabetes, Oral Hypoglycemic Agents  Renal/GU negative Renal ROS     Musculoskeletal negative musculoskeletal ROS (+)   Abdominal (+) + obese,   Peds  Hematology negative hematology ROS (+)   Anesthesia Other Findings Past Medical History: No date: Schizophrenia ([place]) No date: Tachycardia   Reproductive/Obstetrics                             Anesthesia Physical  Anesthesia Plan  ASA: III  Anesthesia Plan: General   Post-op Pain Management:    Induction: Intravenous  PONV Risk Score and Plan: 2 and Ondansetron  Airway Management Planned: Mask  Additional Equipment:   Intra-op Plan:   Post-operative Plan:   Informed Consent: I have reviewed the patients History and Physical, chart, labs and discussed the procedure including the risks, benefits and alternatives for the proposed anesthesia with the patient or authorized representative who has indicated his/her understanding and acceptance.       Plan Discussed with: CRNA and  Anesthesiologist  Anesthesia Plan Comments:         Anesthesia Quick Evaluation

## 2019-01-18 NOTE — Progress Notes (Signed)
This Probation officer received report from [place], stating that patient did well during ECT procedure and that he's waking up. Patient's IV will be taken out and he will be returning back to the unit soon.

## 2019-01-18 NOTE — Plan of Care (Signed)
D- Patient alert and oriented. Patient presented in a pleasant mood on assessment stating that he slept "horrible" last night, however, he did not go into details as to why by stating "personal reasons". Patient had no major complaints or concerns to voice to this writer, but he is still hypersexual and sexually inappropriate with this Probation officer and other staff members. Patient denied any depression/anxiety stating "nah, I'm alright". Patient also denied SI, HI, AVH, and pain. Quotes. Goal. How did they do with achieving previous goal.  A- Scheduled medications administered to patient, per MD orders. Support and encouragement provided.  Routine safety checks conducted every 15 minutes.  Patient informed to notify staff with problems or concerns.  R- No adverse drug reactions noted. Patient contracts for safety at this time. Patient compliant with medications and treatment plan. Patient receptive, calm, and cooperative. Patient interacts well with others on the unit.  Patient remains safe at this time.  Problem: Education: Goal: Knowledge of [facility] Education information/materials will improve Outcome: Progressing Goal: Emotional status will improve Outcome: Progressing Goal: Mental status will improve Outcome: Progressing Goal: Verbalization of understanding the information provided will improve Outcome: Progressing   Problem: Activity: Goal: Interest or engagement in activities will improve Outcome: Progressing Goal: Sleeping patterns will improve Outcome: Progressing   Problem: Coping: Goal: Coping ability will improve Outcome: Progressing Goal: Will verbalize feelings Outcome: Progressing   Problem: Health Behavior/Discharge Planning: Goal: Ability to make decisions will improve Outcome: Progressing Goal: Compliance with therapeutic regimen will improve Outcome: Progressing   Problem: Safety: Goal: Ability to disclose and discuss suicidal ideas will improve Outcome:  Progressing Goal: Ability to identify and utilize support systems that promote safety will improve Outcome: Progressing

## 2019-01-18 NOTE — Procedures (Signed)
ECT SERVICES Physician's Interval Evaluation & Treatment Note  Patient Identification: Jonathan Alexander. MRN:  [id number] Date of Evaluation:  01/18/2019 TX #: [id number]  MADRS:   MMSE:   P.E. Findings:  No change to physical exam  Psychiatric Interval Note:  Euphoric slightly agitated hypersexual and inappropriate  Subjective:  Patient is a 33 y.o. male seen for evaluation for Electroconvulsive Therapy. No complaints  Treatment Summary:   []   Right Unilateral             [x]  Bilateral   % Energy : 1.0 ms 100%   Impedance: 1510 ohms  Seizure Energy Index: 26,469 V squared  Postictal Suppression Index: 93%  Seizure Concordance Index: 96%  Medications  Pre Shock: Robinul 0.2 mg Brevital 70 mg succinylcholine 100 mg  Post Shock: Versed 2 mg  Seizure Duration: 19 seconds by EMG 28 seconds by EEG   Comments: Continue treatment with next treatment Wednesday inpatient  Lungs:  [x]   Clear to auscultation               []  Other:   Heart:    [x]   Regular rhythm             []  irregular rhythm    [x]   Previous H&P reviewed, patient examined and there are NO CHANGES                 []   Previous H&P reviewed, patient examined and there are changes noted.   Alethia Berthold, MD 6/15/20204:38 PM

## 2019-01-18 NOTE — Progress Notes (Signed)
[facility] MD Progress Note  01/18/2019 4:35 PM Jonathan Alexander.  MRN:  [id number] Subjective: Patient seen and chart reviewed.  Follow-up for this patient with schizoaffective disorder.  Had ECT this morning which was tolerated well.  Patient continues to be euphoric hypersexual hyper religious and at times intrusive and difficult.  Has not been violent with anyone.  Denies suicidal thoughts.  Labs reviewed.  Low clozapine level on admission. Principal Problem: Schizoaffective disorder, bipolar type ([place]) Diagnosis: Principal Problem:   Schizoaffective disorder, bipolar type ([place]) Active Problems:   Hypertension   Schizophrenia ([place])  Total Time spent with patient: 30 minutes  Past Psychiatric History: Past history of schizoaffective disorder multiple hospitalizations lengthy state hospital stays.  Good response to ECT  Past Medical History:  Past Medical History:  Diagnosis Date  . Schizophrenia ([place])   . Tachycardia    History reviewed. No pertinent surgical history. Family History:  Family History  Family history unknown: Yes   Family Psychiatric  History: See previous Social History:  Social History   Substance and Sexual Activity  Alcohol Use No     Social History   Substance and Sexual Activity  Drug Use No    Social History   Socioeconomic History  . Marital status: Single    Spouse name: Not on file  . Number of children: Not on file  . Years of education: Not on file  . Highest education level: Not on file  Occupational History  . Not on file  Social Needs  . Financial resource strain: Not on file  . Food insecurity    Worry: Not on file    Inability: Not on file  . Transportation needs    Medical: Not on file    Non-medical: Not on file  Tobacco Use  . Smoking status: Current Every Day Smoker    Packs/day: 1.00    Years: 4.00    Pack years: 4.00    Types: Cigarettes  . Smokeless tobacco: Never Used  Substance and Sexual Activity  . Alcohol use:  No  . Drug use: No  . Sexual activity: Never  Lifestyle  . Physical activity    Days per week: Not on file    Minutes per session: Not on file  . Stress: Not on file  Relationships  . Social Herbalist on phone: Not on file    Gets together: Not on file    Attends religious service: Not on file    Active member of club or organization: Not on file    Attends meetings of clubs or organizations: Not on file    Relationship status: Not on file  Other Topics Concern  . Not on file  Social History Narrative  . Not on file   Additional Social History:    Pain Medications: see PTA Prescriptions: see PTA Over the Counter: see PTA History of alcohol / drug use?: No history of alcohol / drug abuse Negative Consequences of Use: Personal relationships Withdrawal Symptoms: Other (Comment)(none)                    Sleep: Fair  Appetite:  Fair  Current Medications: Current Facility-Administered Medications  Medication Dose Route Frequency Provider Last Rate Last Dose  . acetaminophen (TYLENOL) tablet 650 mg  650 mg Oral Q6H PRN Lamont Dowdy, NP      . alum & mag hydroxide-simeth (MAALOX/MYLANTA) 200-200-20 MG/5ML suspension 30 mL  30 mL Oral Q4H PRN Lamont Dowdy,  NP      . clonazePAM (KLONOPIN) tablet 1 mg  1 mg Oral TID Keylen Uzelac, Jonathan DenmarkJohn T, MD   1 mg at 01/18/19 1424  . cloZAPine (CLOZARIL) tablet 300 mg  300 mg Oral QHS Brandn Mcgath T, MD      . desmopressin (DDAVP) tablet 0.2 mg  0.2 mg Oral QHS Jonathan Alexander, Jonathan LankJacqueline, NP   0.2 mg at 01/17/19 2116  . EPINEPHrine (EPI-PEN) injection 0.3 mg  0.3 mg Intramuscular UD Jonathan Alexander, Jacqueline, NP      . ferrous sulfate tablet 325 mg  325 mg Oral Q breakfast Jonathan Alexander, Jacqueline, NP   325 mg at 01/18/19 1424  . imipramine (TOFRANIL) tablet 150 mg  150 mg Oral QHS Jonathan Alexander, Jacqueline, NP   150 mg at 01/17/19 2117  . lithium carbonate (ESKALITH) CR tablet 900 mg  900 mg Oral QHS Jonathan Alexander, Jonathan DenmarkJohn T, MD   900 mg at  01/17/19 2116  . loratadine (CLARITIN) tablet 10 mg  10 mg Oral Daily Jonathan Alexander, Jacqueline, NP   10 mg at 01/18/19 1426  . magnesium hydroxide (MILK OF MAGNESIA) suspension 30 mL  30 mL Oral Daily PRN Jonathan Alexander, Jacqueline, NP      . metFORMIN (GLUCOPHAGE) tablet 500 mg  500 mg Oral BID WC Jonathan Alexander, Jonathan LankJacqueline, NP   500 mg at 01/18/19 1424  . metoprolol succinate (TOPROL-XL) 24 hr tablet 100 mg  100 mg Oral Daily Jonathan Alexander, Jonathan DenmarkJohn T, MD   100 mg at 01/18/19 0839  . OLANZapine (ZYPREXA) tablet 20 mg  20 mg Oral QHS Jonathan Alexander, Jonathan DenmarkJohn T, MD   20 mg at 01/17/19 2116  . polyethylene glycol (MIRALAX / GLYCOLAX) packet 17 g  1 packet Oral q morning - 10a Jonathan Alexander, Jacqueline, NP   17 g at 01/16/19 0841  . venlafaxine XR (EFFEXOR-XR) 24 hr capsule 75 mg  75 mg Oral Q breakfast Jonathan Alexander, Jacqueline, NP   75 mg at 01/18/19 1424    Lab Results:  Results for orders placed or performed during the hospital encounter of 01/13/19 (from the past 48 hour(s))  Glucose, capillary     Status: Abnormal   Collection Time: 01/17/19 10:34 AM  Result Value Ref Range   Glucose-Capillary 107 (H) 70 - 99 mg/dL    Blood Alcohol level:  Lab Results  Component Value Date   ETH <10 01/12/2019   ETH <10 02/09/2018    Metabolic Disorder Labs: Lab Results  Component Value Date   HGBA1C 5.0 02/09/2018   MPG 97 02/09/2018   No results found for: PROLACTIN Lab Results  Component Value Date   CHOL 167 02/09/2018   TRIG 169 (H) 02/09/2018   HDL 38 (L) 02/09/2018   CHOLHDL 4.4 02/09/2018   VLDL 34 02/09/2018   LDLCALC 95 02/09/2018    Physical Findings: AIMS: Facial and Oral Movements Muscles of Facial Expression: None, normal Lips and Perioral Area: Minimal Jaw: None, normal Tongue: Mild,Extremity Movements Upper (arms, wrists, hands, fingers): Mild Lower (legs, knees, ankles, toes): None, normal, Trunk Movements Neck, shoulders, hips: None, normal, Overall Severity Severity of abnormal movements (highest score  from questions above): Mild Incapacitation due to abnormal movements: None, normal Patient's awareness of abnormal movements (rate only patient's report): Aware, no distress, Dental Status Current problems with teeth and/or dentures?: No Does patient usually wear dentures?: No  CIWA:  CIWA-Ar Total: 0 COWS:  COWS Total Score: 1  Musculoskeletal: Strength & Muscle Tone: within normal limits Gait & Station: normal Patient leans: N/A  Psychiatric Specialty Exam:  Physical Exam  Nursing note and vitals reviewed. Constitutional: He appears well-developed and well-nourished.  HENT:  Head: Normocephalic and atraumatic.  Eyes: Pupils are equal, round, and reactive to light. Conjunctivae are normal.  Neck: Normal range of motion.  Cardiovascular: Regular rhythm and normal heart sounds.  Respiratory: Effort normal.  GI: Soft.  Musculoskeletal: Normal range of motion.  Neurological: He is alert.  Skin: Skin is warm and dry.  Psychiatric: His affect is labile and inappropriate. His speech is tangential. He is agitated. He is not aggressive. Cognition and memory are impaired. He expresses impulsivity. He expresses no homicidal and no suicidal ideation.    Review of Systems  Constitutional: Negative.   HENT: Negative.   Eyes: Negative.   Respiratory: Negative.   Cardiovascular: Negative.   Gastrointestinal: Negative.   Musculoskeletal: Negative.   Skin: Negative.   Neurological: Negative.   Psychiatric/Behavioral: Negative.     Blood pressure 111/70, pulse (!) 105, temperature 98.6 F (37 C), temperature source Oral, resp. rate 18, height 5' 11.7" (1.821 m), weight 95.3 kg, SpO2 100 %.Body mass index is 28.72 kg/m.  General Appearance: Casual  Eye Contact:  Fair  Speech:  Garbled  Volume:  Increased  Mood:  Euphoric  Affect:  Inappropriate  Thought Process:  Disorganized  Orientation:  Full (Time, Place, and Person)  Thought Content:  Illogical, Rumination and Tangential   Suicidal Thoughts:  No  Homicidal Thoughts:  No  Memory:  Immediate;   Fair Recent;   Fair Remote;   Fair  Judgement:  Fair  Insight:  Fair  Psychomotor Activity:  Decreased  Concentration:  Concentration: Fair  Recall:  F[employer]ervFair  Fund of Knowledge:  Fair  Language:  Fair  Akathisia:  No  Handed:  Right  AIMS (if indicated):     Assets:  Desire for Improvement Housing Physical Health Resilience  ADL's:  Intact  Cognition:  Impaired,  Mild  Sleep:  Number of Hours: 6.5     Treatment Plan Summary: Daily contact with patient to assess and evaluate symptoms and progress in treatment, Medication management and Plan Patient with schizoaffective disorder continues to show signs of mania beyond what his group home could manage.  Had ECT treatment today.  Tolerated without difficulty.  After reviewing labs I am increasing his clozapine to 300 mg and we will also check a lithium level tomorrow.  Jonathan RasmussenJohn Jamar Weatherall, MD 01/18/2019, 4:35 PM

## 2019-01-18 NOTE — Progress Notes (Signed)
Patient just knocked on the door of the nurse's station and stated that he wants this writer to come to his room.

## 2019-01-18 NOTE — Transfer of Care (Signed)
Immediate Anesthesia Transfer of Care Note  Patient: Jonathan Alexander.  Procedure(s) Performed: ECT TX  Patient Location: PACU  Anesthesia Type:General  Level of Consciousness: sedated  Airway & Oxygen Therapy: Patient Spontanous Breathing and Patient connected to face mask oxygen  Post-op Assessment: Report given to RN and Post -op Vital signs reviewed and stable  Post vital signs: Reviewed and stable  Last Vitals:  Vitals Value Taken Time  BP 135/94 01/18/19 1033  Temp    Pulse 102 01/18/19 1034  Resp 18 01/18/19 1034  SpO2 100 % 01/18/19 1034  Vitals shown include unvalidated device data.  Last Pain:  Vitals:   01/18/19 0953  TempSrc: Oral  PainSc:          Complications: No apparent anesthesia complications

## 2019-01-19 ENCOUNTER — Other Ambulatory Visit: Payer: Self-pay | Admitting: Psychiatry

## 2019-01-19 LAB — LITHIUM LEVEL: Lithium Lvl: 1.17 mmol/L (ref 0.60–1.20)

## 2019-01-19 NOTE — [facility] Group Notes (Signed)
Feelings Around Diagnosis 01/19/2019 1PM  Type of Therapy/Topic:  Group Therapy:  Feelings about Diagnosis  Participation Level:  Active   Description of Group:   This group will allow patients to explore their thoughts and feelings about diagnoses they have received. Patients will be guided to explore their level of understanding and acceptance of these diagnoses. Facilitator will encourage patients to process their thoughts and feelings about the reactions of others to their diagnosis and will guide patients in identifying ways to discuss their diagnosis with significant others in their lives. This group will be process-oriented, with patients participating in exploration of their own experiences, giving and receiving support, and processing challenge from other group members.   Therapeutic Goals: 1. Patient will demonstrate understanding of diagnosis as evidenced by identifying two or more symptoms of the disorder 2. Patient will be able to express two feelings regarding the diagnosis 3. Patient will demonstrate their ability to communicate their needs through discussion and/or role play  Summary of Patient Progress:  Pt reports he was diagnosed with confusion and wants to make God happy. Pt open to receiving feedback from group members. Pt respected boundaries during group session.   Therapeutic Modalities:   Cognitive Behavioral Therapy Brief Therapy Feelings Identification    Yvette Rack, LCSW 01/19/2019 2:08 PM

## 2019-01-19 NOTE — Progress Notes (Signed)
Patient continues to be extremely inappropriate and hypersexual. While patient was in the medication room receiving his morning meds, patient stated to this writer "baby, let me lick your pussy, I'll suck on your clit and make you come". Patient also stated "just give me one kiss, what I'm not worth it".

## 2019-01-19 NOTE — Progress Notes (Addendum)
Recreation Therapy Notes  Date: 01/19/2019  Time: 9:30 am  Location: Craft room  Behavioral response: off-topic, redirected  Intervention Topic: Coping Skills  Discussion/Intervention:  Group content on today was focused on coping skills. The group defined what coping skills are and when they can be used. Individuals described how they normally cope with thing and the coping skills they normally use. Patients expressed why it is important to cope with things and how not coping with things can affect you. The group participated in the intervention "My coping box" and made coping boxes while adding coping skills they could use in the future to the box. Clinical Observations/Feedback:  Patient came to group and defined coping skills as peace. He was off topic and  distracting peers from group. Participant was redirected several times by group facilitator. Individual was not able to follow directions to engage in the group intervention.  Willy Vorce LRT/CTRS         Sanford Lindblad 01/19/2019 11:26 AM

## 2019-01-19 NOTE — Progress Notes (Signed)
[facility]  01/19/2019 4:23 PM Jonathan Horton MarshallSantana Jr.  MRN:  [id number] Subjective: Patient seen and chart reviewed.  Patient continues to be hyperactive showing signs of mania.  He is on his feet almost all day frequently getting into other people's business.  Approaches me every time I step into the hallway to want to have another discussion.  Most of his conversation is hyper religious and focused on preaching.  Other patients comment on how he dominates the group with his hyper religious comments.  He is not however seen as making trouble and has not been aggressive or threatening to anyone.  Thank goodness he seems to have cut down on the hypersexual comments and behavior.  Lithium level came back 1.17.  He is not complaining of any symptoms of lithium toxicity. Principal Problem: Schizoaffective disorder, bipolar type (HCC) Diagnosis: Principal Problem:   Schizoaffective disorder, bipolar type (HCC) Active Problems:   Hypertension   Schizophrenia (HCC)  Total Time spent with patient: 30 minutes  Past Psychiatric History: Patient has a long history of chronic mental illness.  History of aggression when psychotic in the past.  Had been fairly stable with a combination of medicine and ECT since his last hospitalization  Past Medical History:  Past Medical History:  Diagnosis Date  . Schizophrenia (HCC)   . Tachycardia    History reviewed. No pertinent surgical history. Family History:  Family History  Family history unknown: Yes   Family Psychiatric  History: None known Social History:  Social History   Substance and Sexual Activity  Alcohol Use No     Social History   Substance and Sexual Activity  Drug Use No    Social History   Socioeconomic History  . Marital status: Single    Spouse name: Not on file  . Number of children: Not on file  . Years of education: Not on file  . Highest education level: Not on file  Occupational History  . Not on file  Social  Needs  . Financial resource strain: Not on file  . Food insecurity    Worry: Not on file    Inability: Not on file  . Transportation needs    Medical: Not on file    Non-medical: Not on file  Tobacco Use  . Smoking status: Current Every Day Smoker    Packs/day: 1.00    Years: 4.00    Pack years: 4.00    Types: Cigarettes  . Smokeless tobacco: Never Used  Substance and Sexual Activity  . Alcohol use: No  . Drug use: No  . Sexual activity: Never  Lifestyle  . Physical activity    Days per week: Not on file    Minutes per session: Not on file  . Stress: Not on file  Relationships  . Social Musicianconnections    Talks on phone: Not on file    Gets together: Not on file    Attends religious service: Not on file    Active member of club or organization: Not on file    Attends meetings of clubs or organizations: Not on file    Relationship status: Not on file  Other Topics Concern  . Not on file  Social History Narrative  . Not on file   Additional Social History:    Pain Medications: see PTA Prescriptions: see PTA Over the Counter: see PTA History of alcohol / drug use?: No history of alcohol / drug abuse Negative Consequences of Use: Personal relationships Withdrawal  Symptoms: Other (Comment)(none)                    Sleep: Fair  Appetite:  Fair  Current Medications: Current Facility-Administered Medications  Medication Dose Route Frequency Provider Last Rate Last Dose  . acetaminophen (TYLENOL) tablet 650 mg  650 mg Oral Q6H PRN [name], Jacqueline, NP      . alum & mag hydroxide-simeth (MAALOX/MYLANTA) 200-200-20 MG/5ML suspension 30 mL  30 mL Oral Q4H PRN Thomspon, Adela LankJacqueline, NP      . clonazePAM (KLONOPIN) tablet 1 mg  1 mg Oral TID Geneen Dieter, Jackquline DenmarkJohn T, MD   1 mg at 01/19/19 1250  . cloZAPine (CLOZARIL) tablet 300 mg  300 mg Oral QHS Clair Bardwell T, MD   300 mg at 01/18/19 2216  . desmopressin (DDAVP) tablet 0.2 mg  0.2 mg Oral QHS Thomspon, Adela LankJacqueline, NP    0.2 mg at 01/18/19 2217  . EPINEPHrine (EPI-PEN) injection 0.3 mg  0.3 mg Intramuscular UD Thomspon, Jacqueline, NP      . ferrous sulfate tablet 325 mg  325 mg Oral Q breakfast [name], Jacqueline, NP   325 mg at 01/19/19 0917  . imipramine (TOFRANIL) tablet 150 mg  150 mg Oral QHS [name], Jacqueline, NP   150 mg at 01/18/19 2216  . lithium carbonate (ESKALITH) CR tablet 900 mg  900 mg Oral QHS Sheera Illingworth, Jackquline DenmarkJohn T, MD   900 mg at 01/18/19 2216  . loratadine (CLARITIN) tablet 10 mg  10 mg Oral Daily [name], Jacqueline, NP   10 mg at 01/19/19 0917  . magnesium hydroxide (MILK OF MAGNESIA) suspension 30 mL  30 mL Oral Daily PRN [name], Jacqueline, NP      . metFORMIN (GLUCOPHAGE) tablet 500 mg  500 mg Oral BID WC [name], Jacqueline, NP   500 mg at 01/19/19 0917  . metoprolol succinate (TOPROL-XL) 24 hr tablet 100 mg  100 mg Oral Daily Elmire Amrein, Jackquline DenmarkJohn T, MD   100 mg at 01/19/19 0917  . OLANZapine (ZYPREXA) tablet 20 mg  20 mg Oral QHS Virgle Arth, Jackquline DenmarkJohn T, MD   20 mg at 01/18/19 2216  . polyethylene glycol (MIRALAX / GLYCOLAX) packet 17 g  1 packet Oral q morning - 10a [name], Jacqueline, NP   17 g at 01/19/19 0918  . venlafaxine XR (EFFEXOR-XR) 24 hr capsule 75 mg  75 mg Oral Q breakfast [name], Jacqueline, NP   75 mg at 01/19/19 [id number]    Lab Results:  Results for orders placed or performed during the hospital encounter of 01/13/19 (from the past 48 hour(s))  Lithium level     Status: None   Collection Time: 01/19/19  7:24 AM  Result Value Ref Range   Lithium Lvl 1.17 0.60 - 1.20 mmol/L    Comment: Performed at [facility], [address] [place], [place] [id number]    Blood Alcohol level:  Lab Results  Component Value Date   [facility]   ETH <10 02/09/2018    Metabolic Disorder Labs: Lab Results  Component Value Date   HGBA1C 5.0 02/09/2018   MPG 97 02/09/2018   No results found for: PROLACTIN Lab Results  Component Value Date   CHOL 167 02/09/2018   TRIG  169 (H) 02/09/2018   HDL 38 (L) 02/09/2018   CHOLHDL 4.4 02/09/2018   VLDL 34 02/09/2018   LDLCALC 95 02/09/2018    Physical Findings: AIMS: Facial and Oral Movements Muscles of Facial Expression: None, normal Lips and Perioral Area: Minimal  Jaw: None, normal Tongue: Mild,Extremity Movements Upper (arms, wrists, hands, fingers): Mild Lower (legs, knees, ankles, toes): None, normal, Trunk Movements Neck, shoulders, hips: None, normal, Overall Severity Severity of abnormal movements (highest score from questions above): Mild Incapacitation due to abnormal movements: None, normal Patient's awareness of abnormal movements (rate only patient's report): Aware, no distress, Dental Status Current problems with teeth and/or dentures?: No Does patient usually wear dentures?: No  CIWA:  CIWA-Ar Total: 0 COWS:  COWS Total Score: 1  Musculoskeletal: Strength & Muscle Tone: within normal limits Gait & Station: normal Patient leans: N/A  Psychiatric Specialty Exam: Physical Exam  Nursing note and vitals reviewed. Constitutional: He appears well-developed and well-nourished.  HENT:  Head: Normocephalic and atraumatic.  Eyes: Pupils are equal, round, and reactive to light. Conjunctivae are normal.  Neck: Normal range of motion.  Cardiovascular: Regular rhythm and normal heart sounds.  Respiratory: Effort normal. No respiratory distress.  GI: Soft.  Musculoskeletal: Normal range of motion.  Neurological: He is alert.  Skin: Skin is warm and dry.  Psychiatric: His affect is labile. His speech is tangential. He is agitated. He is not aggressive. Thought content is delusional. Cognition and memory are impaired. He expresses impulsivity.    Review of Systems  Constitutional: Negative.   HENT: Negative.   Eyes: Negative.   Respiratory: Negative.   Cardiovascular: Negative.   Gastrointestinal: Negative.   Musculoskeletal: Negative.   Skin: Negative.   Neurological: Negative.    Psychiatric/Behavioral: Negative for depression, hallucinations, memory loss, substance abuse and suicidal ideas. The patient is not nervous/anxious and does not have insomnia.     Blood pressure 121/84, pulse 99, temperature 98.2 F (36.8 C), temperature source Oral, resp. rate 18, height 5' 11.7" (1.821 m), weight 95.3 kg, SpO2 99 %.Body mass index is 28.72 kg/m.  General Appearance: Fairly Groomed  Eye Contact:  Good  Speech:  Pressured  Volume:  Increased  Mood:  Euphoric  Affect:  Congruent  Thought Process:  Disorganized  Orientation:  Full (Time, Place, and Person)  Thought Content:  Obsessions, Rumination and Tangential  Suicidal Thoughts:  No  Homicidal Thoughts:  No  Memory:  Immediate;   Fair Recent;   Fair Remote;   Fair  Judgement:  Impaired  Insight:  Shallow  Psychomotor Activity:  Increased  Concentration:  Concentration: Fair  Recall:  [employer] of Knowledge:  Fair  Language:  Fair  Akathisia:  No  Handed:  Right  AIMS (if indicated):     Assets:  Desire for Improvement Housing Physical Health Resilience  ADL's:  Intact  Cognition:  Impaired,  Mild  Sleep:  Number of Hours: 6.5     Treatment Plan Summary: Daily contact with patient to assess and evaluate symptoms and progress in treatment, Medication management and Plan Patient continues to be hyperactive hyperverbal hyper religious frequently intrusive.  He has not however been threatening or violent.  There is obviously no room to increase his lithium.  I increased his clozapine last night by 50 mg so I will not increase it further today.  He has ECT scheduled for tomorrow morning.  Although he is still showing some symptoms of mania we may be able to look at discharge later this week if he is not causing any troublesome behavior or getting aggressive.  Jonathan Berthold, MD 01/19/2019, 4:23 PM

## 2019-01-19 NOTE — Plan of Care (Signed)
D- Patient alert and oriented. Patient presents in a pleasant mood on assessment stating that he slept "real good" last night and had no complaints to voice to this at this time. When asked about depression/anxiety, patient stated "everybody has that" and "I'm worried about not knowing what I'm worried about". Patient also states that he's depressed because "I'm not helping people". Patient denies SI, HI, AVH, and pain. Patient had no stated goals for today. Patient continues to be hypersexual and sexually inappropriate with this Probation officer and other staff members.  A- Scheduled medications administered to patient, per MD orders. Support and encouragement provided.  Routine safety checks conducted every 15 minutes.  Patient informed to notify staff with problems or concerns.  R- No adverse drug reactions noted. Patient contracts for safety at this time. Patient compliant with medications and treatment plan. Patient receptive, calm, and cooperative. Patient interacts well with others on the unit.  Patient remains safe at this time.  Problem: Education: Goal: Knowledge of [facility] Education information/materials will improve Outcome: Progressing Goal: Emotional status will improve Outcome: Progressing Goal: Mental status will improve Outcome: Progressing Goal: Verbalization of understanding the information provided will improve Outcome: Progressing   Problem: Activity: Goal: Interest or engagement in activities will improve Outcome: Progressing Goal: Sleeping patterns will improve Outcome: Progressing   Problem: Coping: Goal: Coping ability will improve Outcome: Progressing Goal: Will verbalize feelings Outcome: Progressing   Problem: Health Behavior/Discharge Planning: Goal: Ability to make decisions will improve Outcome: Progressing Goal: Compliance with therapeutic regimen will improve Outcome: Progressing   Problem: Safety: Goal: Ability to disclose and discuss suicidal  ideas will improve Outcome: Progressing Goal: Ability to identify and utilize support systems that promote safety will improve Outcome: Progressing

## 2019-01-19 NOTE — Plan of Care (Signed)
Active in the milieu. Continues to display hypersexual behaviors.Patient was otherwise pleasant and compliant with medications. Supportive to peers and has no other concerns. Denying hallucinations, denying suicidal thoughts. Currently in bed sleeping. No sign of distress.  Safety precautions maintained.

## 2019-01-20 ENCOUNTER — Inpatient Hospital Stay: Payer: Medicare Other | Admitting: Anesthesiology

## 2019-01-20 LAB — CBC WITH DIFFERENTIAL/PLATELET
Abs Immature Granulocytes: 0.05 10*3/uL (ref 0.00–0.07)
Basophils Absolute: 0.1 10*3/uL (ref 0.0–0.1)
Basophils Relative: 1 %
Eosinophils Absolute: 0 10*3/uL (ref 0.0–0.5)
Eosinophils Relative: 0 %
HCT: 38.7 % — ABNORMAL LOW (ref 39.0–52.0)
Hemoglobin: 12.6 g/dL — ABNORMAL LOW (ref 13.0–17.0)
Immature Granulocytes: 1 %
Lymphocytes Relative: 30 %
Lymphs Abs: 2.2 10*3/uL (ref 0.7–4.0)
MCH: 31.4 pg (ref 26.0–34.0)
MCHC: 32.6 g/dL (ref 30.0–36.0)
MCV: 96.5 fL (ref 80.0–100.0)
Monocytes Absolute: 0.7 10*3/uL (ref 0.1–1.0)
Monocytes Relative: 10 %
Neutro Abs: 4.4 10*3/uL (ref 1.7–7.7)
Neutrophils Relative %: 58 %
Platelets: 235 10*3/uL (ref 150–400)
RBC: 4.01 MIL/uL — ABNORMAL LOW (ref 4.22–5.81)
RDW: 11.9 % (ref 11.5–15.5)
WBC: 7.5 10*3/uL (ref 4.0–10.5)
nRBC: 0 % (ref 0.0–0.2)

## 2019-01-20 LAB — GLUCOSE, CAPILLARY
Glucose-Capillary: 89 mg/dL (ref 70–99)
Glucose-Capillary: 98 mg/dL (ref 70–99)

## 2019-01-20 MED ORDER — SODIUM CHLORIDE 0.9 % IV SOLN
500.0000 mL | Freq: Once | INTRAVENOUS | Status: AC
  Administered 2019-01-20: 10:00:00 500 mL via INTRAVENOUS

## 2019-01-20 MED ORDER — SUCCINYLCHOLINE CHLORIDE 200 MG/10ML IV SOSY
PREFILLED_SYRINGE | INTRAVENOUS | Status: DC | PRN
  Administered 2019-01-20: 100 mg via INTRAVENOUS

## 2019-01-20 MED ORDER — ONDANSETRON HCL 4 MG/2ML IJ SOLN: 4.0000 mg | Freq: Once | INTRAMUSCULAR | Status: DC | PRN

## 2019-01-20 MED ORDER — METHOHEXITAL SODIUM 0.5 G IJ SOLR
INTRAMUSCULAR | Status: AC
  Filled 2019-01-20: qty 500

## 2019-01-20 MED ORDER — MIDAZOLAM HCL 2 MG/2ML IJ SOLN
INTRAMUSCULAR | Status: AC
  Filled 2019-01-20: qty 2

## 2019-01-20 MED ORDER — METHOHEXITAL SODIUM 100 MG/10ML IV SOSY
PREFILLED_SYRINGE | INTRAVENOUS | Status: DC | PRN
  Administered 2019-01-20: 70 mg via INTRAVENOUS

## 2019-01-20 MED ORDER — MIDAZOLAM HCL 2 MG/2ML IJ SOLN
2.0000 mg | Freq: Once | INTRAMUSCULAR | Status: AC
  Administered 2019-01-20: 2 mg via INTRAVENOUS

## 2019-01-20 MED ORDER — GLYCOPYRROLATE 0.2 MG/ML IJ SOLN: 0.1000 mg | Freq: Once | INTRAMUSCULAR | Status: DC

## 2019-01-20 MED ORDER — SODIUM CHLORIDE 0.9 % IV SOLN
INTRAVENOUS | Status: DC | PRN
  Administered 2019-01-20: 10:00:00 via INTRAVENOUS

## 2019-01-20 NOTE — Progress Notes (Signed)
D: Patient has been drawing pictures in the dayroom, proudly showing off his "abstract art" to staff. No sexually inappropriate behavior observed. Denies SI, HI and AVH. Medication compliant. Still voicing some religious preoccupation. Says he came here because the [place] told him to come here to get better. Mood is pleasant. Affect appropriate to circumstance. A: Continue to monitor for safety. R: Safety maintained.

## 2019-01-20 NOTE — [facility] Group Notes (Signed)
LCSW Group Therapy Note  01/20/2019 1:00 PM  Type of Therapy/Topic:  Group Therapy:  Emotion Regulation  Participation Level:  Active   Description of Group:   The purpose of this group is to assist patients in learning to regulate negative emotions and experience positive emotions. Patients will be guided to discuss ways in which they have been vulnerable to their negative emotions. These vulnerabilities will be juxtaposed with experiences of positive emotions or situations, and patients will be challenged to use positive emotions to combat negative ones. Special emphasis will be placed on coping with negative emotions in conflict situations, and patients will process healthy conflict resolution skills.  Therapeutic Goals: 1. Patient will identify two positive emotions or experiences to reflect on in order to balance out negative emotions 2. Patient will label two or more emotions that they find the most difficult to experience 3. Patient will demonstrate positive conflict resolution skills through discussion and/or role plays  Summary of Patient Progress: Patient was present in group, however was preoccupied with religious thought.  Patient often interrupted group to discuss "favorite song about Jesus", "positive things that have happened in church".  Patient this became fixated on talking about Covid-19 in [place].  Patient was inappropriate at the end of group to tell CSW "you're very pretty and you have nice hair".   Therapeutic Modalities:   Cognitive Behavioral Therapy Feelings Identification Dialectical Behavioral Therapy  Assunta Curtis, MSW, LCSW 01/20/2019 12:49 PM

## 2019-01-20 NOTE — Anesthesia Post-op Follow-up Note (Signed)
Anesthesia QCDR form completed.        

## 2019-01-20 NOTE — Plan of Care (Signed)
  Problem: Coping: Goal: Will verbalize feelings Outcome: Progressing  Patient verbalized feeling to writer , his speech was tangential, thoughts disorganized.
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
## 2019-01-20 NOTE — Procedures (Signed)
ECT SERVICES Physician's Interval Evaluation & Treatment Note  Patient Identification: Jonathan Alexander. MRN:  [id number] Date of Evaluation:  01/20/2019 TX #: [id number]  MADRS:   MMSE:   P.E. Findings:  No change physical exam  Psychiatric Interval Note:  Continues with manic symptoms  Subjective:  Patient is a 33 y.o. male seen for evaluation for Electroconvulsive Therapy. No specific complaint.  He feels "great"  Treatment Summary:   []   Right Unilateral             [x]  Bilateral   % Energy : 1.0 ms 100%   Impedance: 1360 ohms  Seizure Energy Index: 18,302 V squared  Postictal Suppression Index: 93%  Seizure Concordance Index: 98%  Medications  Pre Shock: Robinul 0.2 mg Brevital 70 mg succinylcholine 100 mg  Post Shock: Versed 2 mg  Seizure Duration: EMG 26 seconds EEG 29 seconds   Comments: Showing some improvement but still needs continued treatment and hospitalization next treatment Friday  Lungs:  [x]   Clear to auscultation               []  Other:   Heart:    [x]   Regular rhythm             []  irregular rhythm    [x]   Previous H&P reviewed, patient examined and there are NO CHANGES                 []   Previous H&P reviewed, patient examined and there are changes noted.   Alethia Berthold, MD 6/17/202010:11 AM

## 2019-01-20 NOTE — Transfer of Care (Signed)
Immediate Anesthesia Transfer of Care Note  Patient: Jonathan Alexander.  Procedure(s) Performed: ECT TX  Patient Location: PACU  Anesthesia Type:General  Level of Consciousness: sedated  Airway & Oxygen Therapy: Patient Spontanous Breathing and Patient connected to face mask oxygen  Post-op Assessment: Report given to RN and Post -op Vital signs reviewed and stable  Post vital signs: Reviewed and stable  Last Vitals:  Vitals Value Taken Time  BP 131/87 01/20/19 1027  Temp    Pulse 108 01/20/19 1027  Resp 19 01/20/19 1027  SpO2 100 % 01/20/19 1027  Vitals shown include unvalidated device data.  Last Pain:  Vitals:   01/20/19 0956  TempSrc: Oral  PainSc:          Complications: No apparent anesthesia complications

## 2019-01-20 NOTE — Anesthesia Preprocedure Evaluation (Signed)
Anesthesia Evaluation  Patient identified by MRN, date of birth, ID band Patient awake    Reviewed: Allergy & Precautions, NPO status , Patient's Chart, lab work & pertinent test results, reviewed documented beta blocker date and time   History of Anesthesia Complications Negative for: history of anesthetic complications  Airway Mallampati: II  TM Distance: >3 FB     Dental  (+) Chipped   Pulmonary neg sleep apnea, neg COPD, Current Smoker, former smoker,    breath sounds clear to auscultation- rhonchi (-) wheezing      Cardiovascular Exercise Tolerance: Good hypertension, (-) CAD, (-) Past MI and (-) Cardiac Stents  Rhythm:Regular Rate:Normal - Systolic murmurs and - Diastolic murmurs    Neuro/Psych PSYCHIATRIC DISORDERS Bipolar Disorder Schizophrenia negative neurological ROS     GI/Hepatic negative GI ROS, Neg liver ROS,   Endo/Other  diabetes, Oral Hypoglycemic Agents  Renal/GU negative Renal ROS     Musculoskeletal negative musculoskeletal ROS (+)   Abdominal (+) + obese,   Peds  Hematology negative hematology ROS (+)   Anesthesia Other Findings    Reproductive/Obstetrics                             Anesthesia Physical  Anesthesia Plan  ASA: III  Anesthesia Plan: General   Post-op Pain Management:    Induction: Intravenous  PONV Risk Score and Plan: 2 and Ondansetron  Airway Management Planned: Mask  Additional Equipment:   Intra-op Plan:   Post-operative Plan:   Informed Consent: I have reviewed the patients History and Physical, chart, labs and discussed the procedure including the risks, benefits and alternatives for the proposed anesthesia with the patient or authorized representative who has indicated his/her understanding and acceptance.       Plan Discussed with: CRNA and Anesthesiologist  Anesthesia Plan Comments:         Anesthesia Quick  Evaluation

## 2019-01-20 NOTE — Progress Notes (Signed)
[facility] MD Progress Note  01/20/2019 5:21 PM [place].  MRN:  [id number] Subjective: Patient seen and chart reviewed.  Patient had ECT this morning.  Nursing reports that the patient continues with a lot of hypersexual behavior and agitation.  He was able however to tolerate ECT without any behavior problems and this afternoon seems slightly calmer.  Still hyper religious and at times hypersexual but controlling his behavior so as not to be too disruptive.  Denies suicidal thinking.  Denies hallucinations.  Still seems a little slow which has been a chronic thing and has limited insight. Principal Problem: Schizoaffective disorder, bipolar type ([place] Alexander) Diagnosis: Principal Problem:   Schizoaffective disorder, bipolar type ([place]) Active Problems:   Hypertension   Schizophrenia ([place])  Total Time spent with patient: 30 minutes  Past Psychiatric History: Past psychiatric history of schizoaffective disorder with lengthy hospitalizations which were only eventually able to and because of his good response to ECT.  History of aggression and violent with mania in the past  Past Medical History:  Past Medical History:  Diagnosis Date  . Schizophrenia ([place])   . Tachycardia    History reviewed. No pertinent surgical history. Family History:  Family History  Family history unknown: Yes   Family Psychiatric  History: None known Social History:  Social History   Substance and Sexual Activity  Alcohol Use No     Social History   Substance and Sexual Activity  Drug Use No    Social History   Socioeconomic History  . Marital status: Single    Spouse name: Not on file  . Number of children: Not on file  . Years of education: Not on file  . Highest education level: Not on file  Occupational History  . Not on file  Social Needs  . Financial resource strain: Not on file  . Food insecurity    Worry: Not on file    Inability: Not on file  . Transportation needs    Medical: Not on  file    Non-medical: Not on file  Tobacco Use  . Smoking status: Current Every Day Smoker    Packs/day: 1.00    Years: 4.00    Pack years: 4.00    Types: Cigarettes  . Smokeless tobacco: Never Used  Substance and Sexual Activity  . Alcohol use: No  . Drug use: No  . Sexual activity: Never  Lifestyle  . Physical activity    Days per week: Not on file    Minutes per session: Not on file  . Stress: Not on file  Relationships  . Social Herbalist on phone: Not on file    Gets together: Not on file    Attends religious service: Not on file    Active member of club or organization: Not on file    Attends meetings of clubs or organizations: Not on file    Relationship status: Not on file  Other Topics Concern  . Not on file  Social History Narrative  . Not on file   Additional Social History:    Pain Medications: see PTA Prescriptions: see PTA Over the Counter: see PTA History of alcohol / drug use?: No history of alcohol / drug abuse Negative Consequences of Use: Personal relationships Withdrawal Symptoms: Other (Comment)(none)                    Sleep: Fair  Appetite:  Fair  Current Medications: Current Facility-Administered Medications  Medication Dose  Route Frequency Provider Last Rate Last Dose  . acetaminophen (TYLENOL) tablet 650 mg  650 mg Oral Q6H PRN [name], Jacqueline, NP      . alum & mag hydroxide-simeth (MAALOX/MYLANTA) 200-200-20 MG/5ML suspension 30 mL  30 mL Oral Q4H PRN Thomspon, Adela LankJacqueline, NP      . clonazePAM (KLONOPIN) tablet 1 mg  1 mg Oral TID Merrissa Giacobbe, Jackquline DenmarkJohn T, MD   1 mg at 01/20/19 1151  . cloZAPine (CLOZARIL) tablet 300 mg  300 mg Oral QHS Tangi Shroff T, MD   300 mg at 01/19/19 2220  . desmopressin (DDAVP) tablet 0.2 mg  0.2 mg Oral QHS Thomspon, Adela LankJacqueline, NP   0.2 mg at 01/19/19 2220  . EPINEPHrine (EPI-PEN) injection 0.3 mg  0.3 mg Intramuscular UD Thomspon, Jacqueline, NP      . ferrous sulfate tablet 325 mg  325 mg  Oral Q breakfast [name], Jacqueline, NP   325 mg at 01/20/19 1151  . glycopyrrolate (ROBINUL) injection 0.1 mg  0.1 mg Intravenous Once Miquela Costabile T, MD      . imipramine (TOFRANIL) tablet 150 mg  150 mg Oral QHS Thomspon, Adela LankJacqueline, NP   150 mg at 01/19/19 2220  . lithium carbonate (ESKALITH) CR tablet 900 mg  900 mg Oral QHS Marja Adderley T, MD   900 mg at 01/19/19 2220  . loratadine (CLARITIN) tablet 10 mg  10 mg Oral Daily [name], Jacqueline, NP   10 mg at 01/20/19 1153  . magnesium hydroxide (MILK OF MAGNESIA) suspension 30 mL  30 mL Oral Daily PRN [name], Jacqueline, NP      . metFORMIN (GLUCOPHAGE) tablet 500 mg  500 mg Oral BID [name], Adela LankJacqueline, NP   500 mg at 01/20/19 0756  . metoprolol succinate (TOPROL-XL) 24 hr tablet 100 mg  100 mg Oral Daily Paelyn Smick, Jackquline DenmarkJohn T, MD   100 mg at 01/20/19 0756  . OLANZapine (ZYPREXA) tablet 20 mg  20 mg Oral QHS Carry Weesner T, MD   20 mg at 01/19/19 2220  . polyethylene glycol (MIRALAX / GLYCOLAX) packet 17 g  1 packet Oral q morning - 10a Thomspon, Adela LankJacqueline, NP   17 g at 01/20/19 1151  . venlafaxine XR (EFFEXOR-XR) 24 hr capsule 75 mg  75 mg Oral Q breakfast [name], Jacqueline, NP   75 mg at 01/20/19 1151    Lab Results:  Results for orders placed or performed during the hospital encounter of 01/13/19 (from the past 48 hour(s))  Lithium level     Status: None   Collection Time: 01/19/19  7:24 AM  Result Value Ref Range   Lithium Lvl 1.17 0.60 - 1.20 mmol/L    Comment: Performed at [facility], [address] [place], [place] [address]  Glucose, capillary     Status: None   Collection Time: 01/20/19  6:46 AM  Result Value Ref Range   Glucose-Capillary 89 70 - 99 mg/dL   Comment 1 Notify RN   Glucose, capillary     Status: None   Collection Time: 01/20/19  7:54 AM  Result Value Ref Range   Glucose-Capillary 98 70 - 99 mg/dL  CBC with Differential/Platelet     Status: Abnormal   Collection Time: 01/20/19  9:40 AM   Result Value Ref Range   WBC 7.5 4.0 - 10.5 K/uL   RBC 4.01 (L) 4.22 - 5.81 MIL/uL   Hemoglobin 12.6 (L) 13.0 - 17.0 g/dL   HCT 60.438.7 (L) 54.039.0 - 98.152.0 %   MCV  96.5 80.0 - 100.0 fL   MCH 31.4 26.0 - 34.0 pg   MCHC 32.6 30.0 - 36.0 g/dL   RDW 16.111.9 09.611.5 - 04.515.5 %   Platelets 235 150 - 400 K/uL   nRBC 0.0 0.0 - 0.2 %   Neutrophils Relative % 58 %   Neutro Abs 4.4 1.7 - 7.7 K/uL   Lymphocytes Relative 30 %   Lymphs Abs 2.2 0.7 - 4.0 K/uL   Monocytes Relative 10 %   Monocytes Absolute 0.7 0.1 - 1.0 K/uL   Eosinophils Relative 0 %   Eosinophils Absolute 0.0 0.0 - 0.5 K/uL   Basophils Relative 1 %   Basophils Absolute 0.1 0.0 - 0.1 K/uL   Immature Granulocytes 1 %   Abs Immature Granulocytes 0.05 0.00 - 0.07 K/uL    Comment: Performed at [facility], [address] [place], [place] [address]    Blood Alcohol level:  Lab Results  Component Value Date   [facility] <10 01/12/2019   ETH <10 02/09/2018    Metabolic Disorder Labs: Lab Results  Component Value Date   HGBA1C 5.0 02/09/2018   MPG 97 02/09/2018   No results found for: PROLACTIN Lab Results  Component Value Date   CHOL 167 02/09/2018   TRIG 169 (H) 02/09/2018   HDL 38 (L) 02/09/2018   CHOLHDL 4.4 02/09/2018   VLDL 34 02/09/2018   LDLCALC 95 02/09/2018    Physical Findings: AIMS: Facial and Oral Movements Muscles of Facial Expression: None, normal Lips and Perioral Area: Minimal Jaw: None, normal Tongue: Mild,Extremity Movements Upper (arms, wrists, hands, fingers): Mild Lower (legs, knees, ankles, toes): None, normal, Trunk Movements Neck, shoulders, hips: None, normal, Overall Severity Severity of abnormal movements (highest score from questions above): Mild Incapacitation due to abnormal movements: None, normal Patient's awareness of abnormal movements (rate only patient's report): Aware, no distress, Dental Status Current problems with teeth and/or dentures?: No Does patient usually wear dentures?:  No  CIWA:  CIWA-Ar Total: 0 COWS:  COWS Total Score: 1  Musculoskeletal: Strength & Muscle Tone: within normal limits Gait & Station: normal Patient leans: N/A  Psychiatric Specialty Exam: Physical Exam  Nursing note and vitals reviewed. Constitutional: He appears well-developed and well-nourished.  HENT:  Head: Normocephalic and atraumatic.  Eyes: Pupils are equal, round, and reactive to light. Conjunctivae are normal.  Neck: Normal range of motion.  Cardiovascular: Regular rhythm and normal heart sounds.  Respiratory: Effort normal. No respiratory distress.  GI: Soft.  Musculoskeletal: Normal range of motion.  Neurological: He is alert.  Skin: Skin is warm and dry.  Psychiatric: His affect is labile. His speech is tangential. He is agitated. He is not aggressive. Thought content is delusional. Cognition and memory are impaired. He expresses impulsivity.    Review of Systems  Constitutional: Negative.   HENT: Negative.   Eyes: Negative.   Respiratory: Negative.   Cardiovascular: Negative.   Gastrointestinal: Negative.   Musculoskeletal: Negative.   Skin: Negative.   Neurological: Negative.   Psychiatric/Behavioral: Positive for memory loss. Negative for depression, hallucinations, substance abuse and suicidal ideas. The patient is nervous/anxious. The patient does not have insomnia.     Blood pressure 120/79, pulse (!) 103, temperature (!) 97.5 F (36.4 C), resp. rate 19, height 5' 11.7" (1.821 m), weight 95.3 kg, SpO2 99 %.Body mass index is 28.72 kg/m.  General Appearance: Casual  Eye Contact:  Fair  Speech:  Normal Rate  Volume:  Normal  Mood:  Euthymic  Affect:  Constricted  Thought Process:  Disorganized  Orientation:  Full (Time, Place, and Person)  Thought Content:  Illogical, Paranoid Ideation and Rumination  Suicidal Thoughts:  No  Homicidal Thoughts:  No  Memory:  Immediate;   Fair Recent;   Fair Remote;   Fair  Judgement:  Impaired  Insight:  Shallow   Psychomotor Activity:  Normal  Concentration:  Concentration: Poor  Recall:  Poor  Fund of Knowledge:  Fair  Language:  Fair  Akathisia:  No  Handed:  Right  AIMS (if indicated):     Assets:  Desire for Improvement Housing Physical Health Resilience  ADL's:  Impaired  Cognition:  Impaired,  Mild  Sleep:  Number of Hours: 6.75     Treatment Plan Summary: Daily contact with patient to assess and evaluate symptoms and progress in treatment, Medication management and Plan It is very much my hope that the patient will be well enough that we could perhaps discharge him before the weekend or at latest just after the weekend.  As recently as this morning however he was still showing inappropriate behavior.  Tolerated ECT tolerates medicines and seems to be getting gradually a little bit calmer and less manic.  Mordecai RasmussenJohn Johnsie Moscoso, MD 01/20/2019, 5:21 PM

## 2019-01-20 NOTE — Plan of Care (Signed)
Patient is alert and oriented  X 3, denies SI, HI and AVH. Patient complains of being " tired", forwards very little to RN. Patient has verbalized understanding of no sexual conversations are allowed on the unit with peers or staff. Patient has no issues with eating or sleeping. LBM 01/19/2019;  Patient had ECT procedure today; tolerated well. Patient denies pain 0/10 Safety checks to continue Q 15 minutes. Problem: Education: Goal: Knowledge of [facility] Education information/materials will improve Outcome: Progressing Goal: Emotional status will improve Outcome: Progressing Goal: Mental status will improve Outcome: Progressing Goal: Verbalization of understanding the information provided will improve Outcome: Progressing   Problem: Activity: Goal: Interest or engagement in activities will improve Outcome: Progressing Goal: Sleeping patterns will improve Outcome: Progressing   Problem: Coping: Goal: Coping ability will improve Outcome: Progressing Goal: Will verbalize feelings Outcome: Progressing

## 2019-01-20 NOTE — Plan of Care (Signed)
  Problem: Activity: Goal: Interest or engagement in activities will improve Outcome: Progressing Goal: Sleeping patterns will improve Outcome: Progressing  D: Patient has been drawing pictures in the dayroom, proudly showing off his "abstract art" to staff. No sexually inappropriate behavior observed. Denies SI, HI and AVH. Medication compliant. Still voicing some religious preoccupation. Says he came here because the [facility] told him to come here to get better. Mood is pleasant. Affect appropriate to circumstance. A: Continue to monitor for safety. R: Safety maintained.

## 2019-01-20 NOTE — Progress Notes (Signed)
Recreation Therapy Notes    Date: 01/20/2019   Time: 9:30 am   Location: Craft room   Behavioral response: N/A   Intervention Topic: Anger Management   Discussion/Intervention: Patient did not attend group.   Clinical Observations/Feedback:  Patient did not attend group.   Lidiya Reise LRT/CTRS          Kepler Mccabe 01/20/2019 10:31 AM

## 2019-01-20 NOTE — Anesthesia Postprocedure Evaluation (Signed)
Anesthesia Post Note  Patient: Jonathan Alexander.  Procedure(s) Performed: ECT TX  Patient location during evaluation: PACU Anesthesia Type: General Level of consciousness: sedated Pain management: pain level controlled Vital Signs Assessment: post-procedure vital signs reviewed and stable Respiratory status: spontaneous breathing and respiratory function stable Cardiovascular status: stable Anesthetic complications: no     Last Vitals:  Vitals:   01/20/19 1027 01/20/19 1037  BP: 131/87 114/77  Pulse: (!) 106 (!) 102  Resp: 17 17  Temp: (!) 36.4 C   SpO2: 100% 100%    Last Pain:  Vitals:   01/20/19 1037  TempSrc:   PainSc: Asleep                 Gen Clagg K

## 2019-01-20 NOTE — Progress Notes (Signed)
Patient alert and oriented x 4, affect is flat but brightens upon approach, his thoughts are disorganized, appears religiously preoccupied, less flirtatious with staff, and he was noted appropriate with peers in the dayroom. Patient was compliant with medication regimen.  Support and encouragement was offered to staff, 15 minutes safety checks maintained will continue to monitor

## 2019-01-20 NOTE — Progress Notes (Addendum)
MEDICATION RELATED CONSULT NOTE - INITIAL   Pharmacy Consult for Clozaril  Indication: ANC monitoring   Allergies  Allergen Reactions  . Divalproex Sodium   . Shellfish-Derived Products     Patient Measurements: Height: 5' 11.7" (182.1 cm) Weight: 210 lb (95.3 kg) IBW/kg (Calculated) : 76.91   Vital Signs:   Intake/Output from previous day: No intake/output data recorded. Intake/Output from this shift: No intake/output data recorded.  Labs: No results for input(s): WBC, HGB, HCT, PLT, APTT, CREATININE, LABCREA, CREATININE, CREAT24HRUR, MG, PHOS, ALBUMIN, PROT, ALBUMIN, AST, ALT, ALKPHOS, BILITOT, BILIDIR, IBILI in the last 72 hours. Estimated Creatinine Clearance: 109.8 mL/min (by C-G formula based on SCr of 1.12 mg/dL).   Microbiology: Recent Results (from the past 720 hour(s))  SARS Coronavirus 2 (CEPHEID - Performed in [place] hospital lab), Hosp Order     Status: None   Collection Time: 01/13/19 11:00 AM   Specimen: Nasopharyngeal Swab  Result Value Ref Range Status   SARS Coronavirus 2 NEGATIVE NEGATIVE Final    Comment: (NOTE) If result is NEGATIVE SARS-CoV-2 target nucleic acids are NOT DETECTED. The SARS-CoV-2 RNA is generally detectable in upper and lower  respiratory specimens during the acute phase of infection. The lowest  concentration of SARS-CoV-2 viral copies this assay can detect is 250  copies / mL. A negative result does not preclude SARS-CoV-2 infection  and should not be used as the sole basis for treatment or other  patient management decisions.  A negative result may occur with  improper specimen collection / handling, submission of specimen other  than nasopharyngeal swab, presence of viral mutation(s) within the  areas targeted by this assay, and inadequate number of viral copies  (<250 copies / mL). A negative result must be combined with clinical  observations, patient history, and epidemiological information. If result is  POSITIVE SARS-CoV-2 target nucleic acids are DETECTED. The SARS-CoV-2 RNA is generally detectable in upper and lower  respiratory specimens dur ing the acute phase of infection.  Positive  results are indicative of active infection with SARS-CoV-2.  Clinical  correlation with patient history and other diagnostic information is  necessary to determine patient infection status.  Positive results do  not rule out bacterial infection or co-infection with other viruses. If result is PRESUMPTIVE POSTIVE SARS-CoV-2 nucleic acids MAY BE PRESENT.   A presumptive positive result was obtained on the submitted specimen  and confirmed on repeat testing.  While 2019 novel coronavirus  (SARS-CoV-2) nucleic acids may be present in the submitted sample  additional confirmatory testing may be necessary for epidemiological  and / or clinical management purposes  to differentiate between  SARS-CoV-2 and other Sarbecovirus currently known to infect humans.  If clinically indicated additional testing with an alternate test  methodology [phone]) is advised. The SARS-CoV-2 RNA is generally  detectable in upper and lower respiratory sp ecimens during the acute  phase of infection. The expected result is Negative. Fact Sheet for Patients:  StrictlyIdeas.no Fact Sheet for Healthcare Providers: BankingDealers.co.za This test is not yet approved or cleared by the [place] FDA and has been authorized for detection and/or diagnosis of SARS-CoV-2 by FDA under an Emergency Use Authorization (EUA).  This EUA will remain in effect (meaning this test can be used) for the duration of the COVID-19 declaration under Section 564(b)(1) of the Act, 21 U.S.C. section 360bbb-3(b)(1), unless the authorization is terminated or revoked sooner. Performed at [facility], [address] [place], [place] [address]  Medical History: Past Medical History:   Diagnosis Date  . Schizophrenia (H[facility])   . Tachycardia     Medications:  Medications Prior to Admission  Medication Sig Dispense Refill Last Dose  . clonazePAM (KLONOPIN) 1 MG tablet TAKE ONE TABLET BY MOUTH AT BEDTIME 30 tablet 4 01/17/2019  . clozapine (CLOZARIL) 200 MG tablet TAKE ONE TABLET (200MG ) BY MOUTH EVERY MORNING TAKE 4 TABS (800MG ) BYMOUTH AT BEDTIME. 70 tablet 10 01/17/2019  . desmopressin (DDAVP) 0.2 MG tablet Take 1 tablet (0.2 mg total) by mouth at bedtime. 30 tablet 0 01/17/2019  . EPINEPHrine (EPIPEN 2-PAK) 0.3 mg/0.3 mL IJ SOAJ injection Inject 0.3 mg into the muscle as directed.   01/17/2019  . ferrous sulfate 325 (65 FE) MG tablet Take 1 tablet (325 mg total) by mouth daily with breakfast. 30 tablet 0 01/17/2019  . imipramine (TOFRANIL) 50 MG tablet TAKE 3 TABLETS BY MOUTH AT BEDTIME 90 tablet 10 01/17/2019  . lithium carbonate (LITHOBID) 300 MG CR tablet TAKE TWO TABLETS BY MOUTH AT BEDTIME 60 tablet 0 01/17/2019  . loratadine (CLARITIN) 10 MG tablet Take 1 tablet (10 mg total) by mouth daily. 30 tablet 0 01/17/2019  . metFORMIN (GLUCOPHAGE) 500 MG tablet Take 1 tablet (500 mg total) by mouth 2 (two) times daily with a meal. 60 tablet 0 01/17/2019  . metoprolol succinate (TOPROL-XL) 100 MG 24 hr tablet Take 150 mg by mouth daily.   01/17/2019  . OLANZapine (ZYPREXA) 15 MG tablet Take 1 tablet (15 mg total) by mouth at bedtime. 30 tablet 0 01/17/2019  . polyethylene glycol powder (GLYCOLAX/MIRALAX) powder MIX 17GM INTO 4-8 OUNCES OF FLUID, THEN TAKE BY MOUTH EVERY MORNING 510 g 0 01/17/2019  . venlafaxine XR (EFFEXOR-XR) 75 MG 24 hr capsule TAKE 3 CAPSULES (225MG ) BY MOUTH EVERY DAY WITH BREAKFAST *DO NOT CRUSH* 90 capsule 0 01/17/2019    Assessment: Pt was on Clozaril 200 mg PO QHS.  03/27:   ANC = 5900  06/10:   ANC = 7400 06/17:   ANC = 4400  Goal of Therapy:  CLOZAPINE MONITORING (reflects NEW REMS GUIDELINES EFFECTIVE 05/16/2014):  Check ANC at least weekly while  inpatient.  For general population patients, i.e., those without benign ethnic neutropenia (BEN): --If ANC 1000-1499, increase ANC monitoring to 3x/wk --If ANC < 1000, HOLD CLOZAPINE and get psych consult   For patients with BEN: --If ANC 500-999, increase ANC monitoring to 3x/wk --If ANC < 500, HOLD CLOZAPINE and get psych consult  REMS-certified psychiatry provider can continue drug with ANC below cited thresholds if they document medical opinion that the neutropenia is not clozapine-induced (heme consult is recommended) or that risk of interrupting therapy is greater than the risk of developing severe neutropenia.  --SEE PRESCRIBING INFORMATION FOR ADDITIONAL DETAILS     Plan:  Pt is registered with clozaril REMS program and eligible to receive clozaril.   Pt should have ANC monitored weekly. Called and updated ANC labs; spoke with Shalini S. on 01/20/2019 @1444 .  Next ANC check on 6/24 @ 0800.   Katha CabalAsajah R Derrien Anschutz 01/20/2019,7:11 AM

## 2019-01-20 NOTE — H&P (Signed)
Jonathan Alexander. is an 33 y.o. male.   Chief Complaint: mania HPI: worsening mania over weeks  Past Medical History:  Diagnosis Date  . Schizophrenia ([place])   . Tachycardia     History reviewed. No pertinent surgical history.  Family History  Family history unknown: Yes   Social History:  reports that he has been smoking cigarettes. He has a 4.00 pack-year smoking history. He has never used smokeless tobacco. He reports that he does not drink alcohol or use drugs.  Allergies:  Allergies  Allergen Reactions  . Divalproex Sodium   . Shellfish-Derived Products     Medications Prior to Admission  Medication Sig Dispense Refill  . clonazePAM (KLONOPIN) 1 MG tablet TAKE ONE TABLET BY MOUTH AT BEDTIME 30 tablet 4  . clozapine (CLOZARIL) 200 MG tablet TAKE ONE TABLET (200MG ) BY MOUTH EVERY MORNING TAKE 4 TABS (800MG ) BYMOUTH AT BEDTIME. 70 tablet 10  . desmopressin (DDAVP) 0.2 MG tablet Take 1 tablet (0.2 mg total) by mouth at bedtime. 30 tablet 0  . EPINEPHrine (EPIPEN 2-PAK) 0.3 mg/0.3 mL IJ SOAJ injection Inject 0.3 mg into the muscle as directed.    . ferrous sulfate 325 (65 FE) MG tablet Take 1 tablet (325 mg total) by mouth daily with breakfast. 30 tablet 0  . imipramine (TOFRANIL) 50 MG tablet TAKE 3 TABLETS BY MOUTH AT BEDTIME 90 tablet 10  . lithium carbonate (LITHOBID) 300 MG CR tablet TAKE TWO TABLETS BY MOUTH AT BEDTIME 60 tablet 0  . loratadine (CLARITIN) 10 MG tablet Take 1 tablet (10 mg total) by mouth daily. 30 tablet 0  . metFORMIN (GLUCOPHAGE) 500 MG tablet Take 1 tablet (500 mg total) by mouth 2 (two) times daily with a meal. 60 tablet 0  . metoprolol succinate (TOPROL-XL) 100 MG 24 hr tablet Take 150 mg by mouth daily.    Marland Kitchen OLANZapine (ZYPREXA) 15 MG tablet Take 1 tablet (15 mg total) by mouth at bedtime. 30 tablet 0  . polyethylene glycol powder (GLYCOLAX/MIRALAX) powder MIX 17GM INTO 4-8 OUNCES OF FLUID, THEN TAKE BY MOUTH EVERY MORNING 510 g 0  . venlafaxine XR  (EFFEXOR-XR) 75 MG 24 hr capsule TAKE 3 CAPSULES (225MG ) BY MOUTH EVERY DAY WITH BREAKFAST *DO NOT CRUSH* 90 capsule 0    Results for orders placed or performed during the hospital encounter of 01/13/19 (from the past 48 hour(s))  Lithium level     Status: None   Collection Time: 01/19/19  7:24 AM  Result Value Ref Range   Lithium Lvl 1.17 0.60 - 1.20 mmol/L    Comment: Performed at [facility], [address] [place], [place] [address]  Glucose, capillary     Status: None   Collection Time: 01/20/19  6:46 AM  Result Value Ref Range   Glucose-Capillary 89 70 - 99 mg/dL   Comment 1 Notify RN   Glucose, capillary     Status: None   Collection Time: 01/20/19  7:54 AM  Result Value Ref Range   Glucose-Capillary 98 70 - 99 mg/dL  CBC with Differential/Platelet     Status: Abnormal   Collection Time: 01/20/19  9:40 AM  Result Value Ref Range   WBC 7.5 4.0 - 10.5 K/uL   RBC 4.01 (L) 4.22 - 5.81 MIL/uL   Hemoglobin 12.6 (L) 13.0 - 17.0 g/dL   HCT 38.7 (L) 39.0 - 52.0 %   MCV 96.5 80.0 - 100.0 fL   MCH 31.4 26.0 - 34.0 pg  MCHC 32.6 30.0 - 36.0 g/dL   RDW 16.111.9 09.611.5 - 04.515.5 %   Platelets 235 150 - 400 K/uL   nRBC 0.0 0.0 - 0.2 %   Neutrophils Relative % 58 %   Neutro Abs 4.4 1.7 - 7.7 K/uL   Lymphocytes Relative 30 %   Lymphs Abs 2.2 0.7 - 4.0 K/uL   Monocytes Relative 10 %   Monocytes Absolute 0.7 0.1 - 1.0 K/uL   Eosinophils Relative 0 %   Eosinophils Absolute 0.0 0.0 - 0.5 K/uL   Basophils Relative 1 %   Basophils Absolute 0.1 0.0 - 0.1 K/uL   Immature Granulocytes 1 %   Abs Immature Granulocytes 0.05 0.00 - 0.07 K/uL    Comment: Performed at [facility], [address] [place], [place] [address]   No results found.  Review of Systems  Constitutional: Negative.   HENT: Negative.   Eyes: Negative.   Respiratory: Negative.   Cardiovascular: Negative.   Gastrointestinal: Negative.   Musculoskeletal: Negative.   Skin: Negative.   Neurological:  Negative.   Psychiatric/Behavioral: Negative.     Blood pressure 108/64, pulse 98, temperature 98.2 F (36.8 C), temperature source Oral, resp. rate 18, height 5' 11.7" (1.821 m), weight 95.3 kg, SpO2 99 %. Physical Exam  Nursing note and vitals reviewed. Constitutional: He appears well-developed and well-nourished.  HENT:  Head: Normocephalic and atraumatic.  Eyes: Pupils are equal, round, and reactive to light. Conjunctivae are normal.  Neck: Normal range of motion.  Cardiovascular: Regular rhythm and normal heart sounds.  Respiratory: Effort normal. No respiratory distress.  GI: Soft.  Musculoskeletal: Normal range of motion.  Neurological: He is alert.  Skin: Skin is warm and dry.  Psychiatric: His affect is labile. His affect is not blunt. His speech is tangential. His speech is not delayed. He is agitated. Thought content is delusional. Cognition and memory are impaired. He expresses impulsivity. He is communicative.     Assessment/Plan Continue BL ect next treatment friday  Mordecai RasmussenJohn Declynn Lopresti, MD 01/20/2019, 9:51 AM

## 2019-01-21 ENCOUNTER — Other Ambulatory Visit: Payer: Self-pay | Admitting: Psychiatry

## 2019-01-21 MED ORDER — CLOZAPINE 100 MG PO TABS
350.0000 mg | ORAL_TABLET | Freq: Every day | ORAL | Status: DC
  Administered 2019-01-21 – 2019-01-25 (×5): 350 mg via ORAL
  Filled 2019-01-21 (×7): qty 4

## 2019-01-21 NOTE — Plan of Care (Signed)
Patient is alert and oriented X 3, Denies SI, HI and AVH. Patient has been pleasant and appropriate thus far today. Patient is taking medications, eating and sleeping well,denies pain. Patient is out in the milieu interacting with other peers appropriately. Patient asks questions about religion and Jesus and how people are not perfect. Patient contracts for safety while on the unit Problem: Education: Goal: Knowledge of [place] Education information/materials will improve Outcome: Progressing Goal: Emotional status will improve Outcome: Progressing Goal: Mental status will improve Outcome: Progressing Goal: Verbalization of understanding the information provided will improve Outcome: Progressing   Problem: Activity: Goal: Interest or engagement in activities will improve Outcome: Progressing Goal: Sleeping patterns will improve Outcome: Progressing   Problem: Coping: Goal: Coping ability will improve Outcome: Progressing Goal: Will verbalize feelings Outcome: Progressing  .
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
## 2019-01-21 NOTE — Progress Notes (Signed)
D - Patient was in the day room upon arrival to the unit. Patient was pleasant during assessment and medication administration. Patient denies SI/HI/AVH and pain. Patient endorses anxiety and depression rating them 5/10. Patient stated, "Everyone has depression and I am anxious because of being in here." Patient is preoccupied with region asking this Probation officer, "What do you know about the [place]? Patient given education.   A - Patient compliant with medication administration per MD orders and procedures on the unit. Patient given education. Patient given support and encouragement to be active in his treatment plan. Patient informed to let staff know if there are any issues or problems on the unit.   R - Patient being monitored Q 15 minutes for safety per unit protocol. Patient remains safe on the unit.

## 2019-01-21 NOTE — Progress Notes (Signed)
Recreation Therapy Notes   Date: 01/21/2019  Time: 9:30 am  Location: Craft room  Behavioral response: Appropriate,redirected  Intervention Topic: Communication  Discussion/Intervention:  Group content today was focused on communication. The group defined communication and ways to communicate with others. Individuals stated reason why communication is important and some reasons to communicate with others. Patients expressed if they thought they were good at communicating with others and ways they could improve their communication skills. The group identified important parts of communication and some experiences they have had in the past with communication. The group participated in the intervention "What is that?", where they had a chance to test out their communication skills and identify ways to improve their communication techniques.  Clinical Observations/Feedback:  Patient came to group and defined communication as help. He was redirected by group facilitator to focus on the topic at hand. Individual was social with peers and staff while participating in the intervention. Janit Cutter LRT/CTRS         Haitham Dolinsky 01/21/2019 10:40 AM

## 2019-01-21 NOTE — [facility] Group Notes (Signed)
LCSW Group Therapy Note  01/21/2019 12:39 PM  Type of Therapy/Topic:  Group Therapy:  Balance in Life  Participation Level:  Minimal  Description of Group:    This group will address the concept of balance and how it feels and looks when one is unbalanced. Patients will be encouraged to process areas in their lives that are out of balance and identify reasons for remaining unbalanced. Facilitators will guide patients in utilizing problem-solving interventions to address and correct the stressor making their life unbalanced. Understanding and applying boundaries will be explored and addressed for obtaining and maintaining a balanced life. Patients will be encouraged to explore ways to assertively make their unbalanced needs known to significant others in their lives, using other group members and facilitator for support and feedback.  Therapeutic Goals: 1. Patient will identify two or more emotions or situations they have that consume much of in their lives. 2. Patient will identify signs/triggers that life has become out of balance:  3. Patient will identify two ways to set boundaries in order to achieve balance in their lives:  4. Patient will demonstrate ability to communicate their needs through discussion and/or role plays  Summary of Patient Progress: Pt was present in group and participated minimally. When pt did speak, it was all over the place and not really relevant to the topic discussed.      Therapeutic Modalities:   Cognitive Behavioral Therapy Solution-Focused Therapy Assertiveness Training  Evalina Field, MSW, LCSW Clinical Social Work 01/21/2019 12:39 PM

## 2019-01-21 NOTE — Plan of Care (Signed)
Patient is preoccupied with religous thoughts  Problem: Education: Goal: Emotional status will improve Outcome: Not Progressing Goal: Mental status will improve Outcome: Not Progressing

## 2019-01-22 ENCOUNTER — Inpatient Hospital Stay: Payer: Medicare Other | Admitting: Anesthesiology

## 2019-01-22 ENCOUNTER — Ambulatory Visit
Admission: AD | Admit: 2019-01-22 | Discharge: 2019-01-22 | Disposition: A | Discharge status: Home / Self Care | Payer: Medicare Other | Attending: Psychiatry | Admitting: Psychiatry

## 2019-01-22 LAB — GLUCOSE, CAPILLARY: Glucose-Capillary: 103 mg/dL — ABNORMAL HIGH (ref 70–99)

## 2019-01-22 MED ORDER — SUCCINYLCHOLINE CHLORIDE 20 MG/ML IJ SOLN
INTRAMUSCULAR | Status: AC
  Filled 2019-01-22: qty 1

## 2019-01-22 MED ORDER — SODIUM CHLORIDE 0.9 % IV SOLN
INTRAVENOUS | Status: DC | PRN
  Administered 2019-01-22: 10:00:00 via INTRAVENOUS

## 2019-01-22 MED ORDER — MIDAZOLAM HCL 2 MG/2ML IJ SOLN
INTRAMUSCULAR | Status: DC | PRN
  Administered 2019-01-22: 1 mg via INTRAVENOUS

## 2019-01-22 MED ORDER — SUCCINYLCHOLINE CHLORIDE 200 MG/10ML IV SOSY
PREFILLED_SYRINGE | INTRAVENOUS | Status: DC | PRN
  Administered 2019-01-22: 100 mg via INTRAVENOUS

## 2019-01-22 MED ORDER — METHOHEXITAL SODIUM 100 MG/10ML IV SOSY
PREFILLED_SYRINGE | INTRAVENOUS | Status: DC | PRN
  Administered 2019-01-22: 70 mg via INTRAVENOUS

## 2019-01-22 MED ORDER — MIDAZOLAM HCL 2 MG/2ML IJ SOLN
INTRAMUSCULAR | Status: AC
  Filled 2019-01-22: qty 2

## 2019-01-22 MED ORDER — SODIUM CHLORIDE 0.9 % IV SOLN: 500.0000 mL | Freq: Once | INTRAVENOUS | Status: DC

## 2019-01-22 NOTE — Progress Notes (Signed)
[facility]  01/22/2019 2:21 PM Jonathan Horton MarshallSantana Jr.  MRN:  [id number] Subjective: Patient seen and chart reviewed.  Everybody agrees he is showing some improvement.  He still has some sexually inappropriate comments but he is easily redirected and the amount of that has greatly decreased.  He is not agitated to the point of being disruptive all the time.  Able to calm himself down much better.  He had ECT today which was done without any complication or difficulty.  I have increased his clozapine dose gently. Principal Problem: Schizoaffective disorder, bipolar type (HCC) Diagnosis: Principal Problem:   Schizoaffective disorder, bipolar type (HCC) Active Problems:   Hypertension   Schizophrenia (HCC)  Total Time spent with patient: 30 minutes  Past Psychiatric History: Past history of schizophrenia or schizoaffective disorder with long hospitalizations and good response to ECT  Past Medical History:  Past Medical History:  Diagnosis Date  . Schizophrenia (HCC)   . Tachycardia    History reviewed. No pertinent surgical history. Family History:  Family History  Family history unknown: Yes   Family Psychiatric  History: None known Social History:  Social History   Substance and Sexual Activity  Alcohol Use No     Social History   Substance and Sexual Activity  Drug Use No    Social History   Socioeconomic History  . Marital status: Single    Spouse name: Not on file  . Number of children: Not on file  . Years of education: Not on file  . Highest education level: Not on file  Occupational History  . Not on file  Social Needs  . Financial resource strain: Not on file  . Food insecurity    Worry: Not on file    Inability: Not on file  . Transportation needs    Medical: Not on file    Non-medical: Not on file  Tobacco Use  . Smoking status: Current Every Day Smoker    Packs/day: 1.00    Years: 4.00    Pack years: 4.00    Types: Cigarettes  . Smokeless  tobacco: Never Used  Substance and Sexual Activity  . Alcohol use: No  . Drug use: No  . Sexual activity: Never  Lifestyle  . Physical activity    Days per week: Not on file    Minutes per session: Not on file  . Stress: Not on file  Relationships  . Social Musicianconnections    Talks on phone: Not on file    Gets together: Not on file    Attends religious service: Not on file    Active member of club or organization: Not on file    Attends meetings of clubs or organizations: Not on file    Relationship status: Not on file  Other Topics Concern  . Not on file  Social History Narrative  . Not on file   Additional Social History:    Pain Medications: see PTA Prescriptions: see PTA Over the Counter: see PTA History of alcohol / drug use?: No history of alcohol / drug abuse Negative Consequences of Use: Personal relationships Withdrawal Symptoms: Other (Comment)(none)                    Sleep: Fair  Appetite:  Fair  Current Medications: Current Facility-Administered Medications  Medication Dose Route Frequency Provider Last Rate Last Dose  . acetaminophen (TYLENOL) tablet 650 mg  650 mg Oral Q6H PRN [name], Jacqueline, NP      .  alum & mag hydroxide-simeth (MAALOX/MYLANTA) 200-200-20 MG/5ML suspension 30 mL  30 mL Oral Q4H PRN Lamont Dowdy, NP      . clonazePAM (KLONOPIN) tablet 1 mg  1 mg Oral TID Clapacs, Madie Reno, MD   1 mg at 01/22/19 1127  . cloZAPine (CLOZARIL) tablet 350 mg  350 mg Oral QHS Clapacs, Madie Reno, MD   350 mg at 01/21/19 2208  . desmopressin (DDAVP) tablet 0.2 mg  0.2 mg Oral QHS Thomspon, Geni Bers, NP   0.2 mg at 01/21/19 2208  . EPINEPHrine (EPI-PEN) injection 0.3 mg  0.3 mg Intramuscular UD Thomspon, Jacqueline, NP      . ferrous sulfate tablet 325 mg  325 mg Oral Q breakfast Lamont Dowdy, NP   325 mg at 01/22/19 1128  . glycopyrrolate (ROBINUL) injection 0.1 mg  0.1 mg Intravenous Once Clapacs, John T, MD      . imipramine (TOFRANIL)  tablet 150 mg  150 mg Oral QHS Lamont Dowdy, NP   150 mg at 01/21/19 2208  . lithium carbonate (ESKALITH) CR tablet 900 mg  900 mg Oral QHS Clapacs, Madie Reno, MD   900 mg at 01/21/19 2209  . loratadine (CLARITIN) tablet 10 mg  10 mg Oral Daily Lamont Dowdy, NP   10 mg at 01/22/19 1127  . magnesium hydroxide (MILK OF MAGNESIA) suspension 30 mL  30 mL Oral Daily PRN Lamont Dowdy, NP      . metFORMIN (GLUCOPHAGE) tablet 500 mg  500 mg Oral BID [name], Geni Bers, NP   500 mg at 01/22/19 1127  . metoprolol succinate (TOPROL-XL) 24 hr tablet 100 mg  100 mg Oral Daily Clapacs, John T, MD   100 mg at 01/22/19 0830  . OLANZapine (ZYPREXA) tablet 20 mg  20 mg Oral QHS Clapacs, Madie Reno, MD   20 mg at 01/21/19 2207  . polyethylene glycol (MIRALAX / GLYCOLAX) packet 17 g  1 packet Oral q morning - 10a Thomspon, Geni Bers, NP   17 g at 01/22/19 1128  . venlafaxine XR (EFFEXOR-XR) 24 hr capsule 75 mg  75 mg Oral Q breakfast Lamont Dowdy, NP   75 mg at 01/22/19 1128   Facility-Administered Medications Ordered in Other Encounters  Medication Dose Route Frequency Provider Last Rate Last Dose  . 0.9 %  sodium chloride infusion  500 mL Intravenous Once Clapacs, Madie Reno, MD        Lab Results:  Results for orders placed or performed during the hospital encounter of 01/13/19 (from the past 48 hour(s))  Glucose, capillary     Status: Abnormal   Collection Time: 01/22/19  7:00 AM  Result Value Ref Range   Glucose-Capillary 103 (H) 70 - 99 mg/dL    Blood Alcohol level:  Lab Results  Component Value Date   ETH <10 01/12/2019   ETH <10 69/62/9528    Metabolic Disorder Labs: Lab Results  Component Value Date   HGBA1C 5.0 02/09/2018   MPG 97 02/09/2018   No results found for: PROLACTIN Lab Results  Component Value Date   CHOL 167 02/09/2018   TRIG 169 (H) 02/09/2018   HDL 38 (L) 02/09/2018   CHOLHDL 4.4 02/09/2018   VLDL 34 02/09/2018   LDLCALC 95 02/09/2018     Physical Findings: AIMS: Facial and Oral Movements Muscles of Facial Expression: None, normal Lips and Perioral Area: Minimal Jaw: None, normal Tongue: Mild,Extremity Movements Upper (arms, wrists, hands, fingers): Mild Lower (legs, knees, ankles, toes): None, normal, Trunk Movements Neck, shoulders,  hips: None, normal, Overall Severity Severity of abnormal movements (highest score from questions above): Mild Incapacitation due to abnormal movements: None, normal Patient's awareness of abnormal movements (rate only patient's report): Aware, no distress, Dental Status Current problems with teeth and/or dentures?: No Does patient usually wear dentures?: No  CIWA:  CIWA-Ar Total: 0 COWS:  COWS Total Score: 1  Musculoskeletal: Strength & Muscle Tone: within normal limits Gait & Station: normal Patient leans: N/A  Psychiatric Specialty Exam: Physical Exam  Nursing note and vitals reviewed. Constitutional: He appears well-developed and well-nourished.  HENT:  Head: Normocephalic and atraumatic.  Eyes: Pupils are equal, round, and reactive to light. Conjunctivae are normal.  Neck: Normal range of motion.  Cardiovascular: Regular rhythm and normal heart sounds.  Respiratory: Effort normal. No respiratory distress.  GI: Soft.  Musculoskeletal: Normal range of motion.  Neurological: He is alert.  Skin: Skin is warm and dry.  Psychiatric: His affect is labile. His speech is tangential. He is agitated. He is not aggressive. Thought content is delusional. Cognition and memory are impaired. He expresses impulsivity. He expresses no homicidal and no suicidal ideation.    Review of Systems  Constitutional: Negative.   HENT: Negative.   Eyes: Negative.   Respiratory: Negative.   Cardiovascular: Negative.   Gastrointestinal: Negative.   Musculoskeletal: Negative.   Skin: Negative.   Neurological: Negative.   Psychiatric/Behavioral: Negative.     Blood pressure 124/76, pulse (!)  110, temperature 98.1 F (36.7 C), temperature source Oral, resp. rate 16, height 5' 11.7" (1.821 m), weight 95.3 kg, SpO2 100 %.Body mass index is 28.72 kg/m.  General Appearance: Casual  Eye Contact:  Fair  Speech:  Pressured  Volume:  Normal  Mood:  Euthymic  Affect:  Labile  Thought Process:  Disorganized  Orientation:  Full (Time, Place, and Person)  Thought Content:  Rumination and Tangential  Suicidal Thoughts:  No  Homicidal Thoughts:  No  Memory:  Immediate;   Fair Recent;   Fair Remote;   Fair  Judgement:  Impaired  Insight:  Shallow  Psychomotor Activity:  Increased  Concentration:  Concentration: Fair  Recall:  [employer]Fair  Fund of Knowledge:  Fair  Language:  Fair  Akathisia:  No  Handed:  Right  AIMS (if indicated):     Assets:  Desire for Improvement Housing Physical Health Social Support  ADL's:  Impaired  Cognition:  Impaired,  Mild  Sleep:  Number of Hours: 7     Treatment Plan Summary: Daily contact with patient to assess and evaluate symptoms and progress in treatment, Medication management and Plan Mr. Jonathan Alexander is getting significantly better.  I am optimistic we may be able to consider discharge early next week.  He is on the schedule for ECT Monday.  If he is doing much better we may be able to discharge him and resuming maintenance schedule.  No change to medication today.  [name], MD 01/22/2019, 2:21 PM

## 2019-01-22 NOTE — Anesthesia Postprocedure Evaluation (Signed)
Anesthesia Post Note  Patient: Jonathan Alexander.  Procedure(s) Performed: ECT TX  Patient location during evaluation: PACU Anesthesia Type: General Level of consciousness: awake and alert Pain management: pain level controlled Vital Signs Assessment: post-procedure vital signs reviewed and stable Respiratory status: spontaneous breathing, nonlabored ventilation, respiratory function stable and patient connected to nasal cannula oxygen Cardiovascular status: blood pressure returned to baseline and stable Postop Assessment: no apparent nausea or vomiting Anesthetic complications: no     Last Vitals:  Vitals:   01/22/19 1105 01/22/19 1107  BP:  119/86  Pulse: (!) 112 (!) 114  Resp: 17 16  Temp:  36.5 C  SpO2: 97% 96%    Last Pain:  Vitals:   01/22/19 1107  TempSrc:   PainSc: 0-No pain                 Precious Haws Shelise Maron

## 2019-01-22 NOTE — Procedures (Signed)
ECT SERVICES Physician's Interval Evaluation & Treatment Note  Patient Identification: Jonathan Alexander. MRN:  [id number] Date of Evaluation:  01/22/2019 TX #: [id number]  MADRS:   MMSE:   P.E. Findings:  No change to physical  Psychiatric Interval Note:  Significantly calmer.  Subjective:  Patient is a 33 y.o. male seen for evaluation for Electroconvulsive Therapy. He has no complaint but has a recognition that he is doing better  Treatment Summary:   []   Right Unilateral             [x]  Bilateral   % Energy : 1.0 ms 100%   Impedance: 1440 ohms  Seizure Energy Index: 13,418 V squared  Postictal Suppression Index: 94%  Seizure Concordance Index: 97%  Medications  Pre Shock: Robinul 0.2 mg Brevital 70 mg succinylcholine 100 mg  Post Shock: Versed 2 mg  Seizure Duration: EMG 29 seconds EEG 29 seconds   Comments: He is on the schedule for Monday.  No complication or problem with treatment.  Seems to be showing good response in his mania.  Lungs:  []   Clear to auscultation               []  Other:   Heart:    []   Regular rhythm             []  irregular rhythm    []   Previous H&P reviewed, patient examined and there are NO CHANGES                 []   Previous H&P reviewed, patient examined and there are changes noted.   Alethia Berthold, MD 6/19/20202:24 PM

## 2019-01-22 NOTE — Anesthesia Post-op Follow-up Note (Signed)
Anesthesia QCDR form completed.        

## 2019-01-22 NOTE — Anesthesia Procedure Notes (Signed)
Date/Time: 01/22/2019 10:18 AM Performed by: Dionne Bucy, CRNA Pre-anesthesia Checklist: Patient identified, Emergency Drugs available, Suction available and Patient being monitored Patient Re-evaluated:Patient Re-evaluated prior to induction Oxygen Delivery Method: Circle system utilized Preoxygenation: Pre-oxygenation with 100% oxygen Induction Type: IV induction Ventilation: Mask ventilation without difficulty and Mask ventilation throughout procedure Airway Equipment and Method: Bite block Placement Confirmation: positive ETCO2 Dental Injury: Teeth and Oropharynx as per pre-operative assessment

## 2019-01-22 NOTE — Anesthesia Preprocedure Evaluation (Signed)
Anesthesia Evaluation  Patient identified by MRN, date of birth, ID band Patient awake    Reviewed: Allergy & Precautions, NPO status , Patient's Chart, lab work & pertinent test results, reviewed documented beta blocker date and time   History of Anesthesia Complications Negative for: history of anesthetic complications  Airway Mallampati: II  TM Distance: >3 FB     Dental  (+) Chipped   Pulmonary neg sleep apnea, neg COPD, Current Smoker, former smoker,    breath sounds clear to auscultation- rhonchi (-) wheezing      Cardiovascular Exercise Tolerance: Good hypertension, (-) CAD, (-) Past MI and (-) Cardiac Stents  Rhythm:Regular Rate:Normal - Systolic murmurs and - Diastolic murmurs    Neuro/Psych PSYCHIATRIC DISORDERS Bipolar Disorder Schizophrenia negative neurological ROS     GI/Hepatic negative GI ROS, Neg liver ROS,   Endo/Other  diabetes, Oral Hypoglycemic Agents  Renal/GU negative Renal ROS     Musculoskeletal negative musculoskeletal ROS (+)   Abdominal (+) + obese,   Peds  Hematology negative hematology ROS (+)   Anesthesia Other Findings  Past Medical History: No date: Schizophrenia ([place]) No date: Tachycardia   Reproductive/Obstetrics                             Anesthesia Physical  Anesthesia Plan  ASA: III  Anesthesia Plan: General   Post-op Pain Management:    Induction: Intravenous  PONV Risk Score and Plan: 2 and Ondansetron and TIVA  Airway Management Planned: Mask  Additional Equipment:   Intra-op Plan:   Post-operative Plan:   Informed Consent: I have reviewed the patients History and Physical, chart, labs and discussed the procedure including the risks, benefits and alternatives for the proposed anesthesia with the patient or authorized representative who has indicated his/her understanding and acceptance.     Dental Advisory Given  Plan  Discussed with: CRNA and Anesthesiologist  Anesthesia Plan Comments: (Patient consented for risks of anesthesia including but not limited to:  - adverse reactions to medications - risk of intubation if required - damage to teeth, lips or other oral mucosa - sore throat or hoarseness - Damage to heart, brain, lungs or loss of life  Patient voiced understanding.)        Anesthesia Quick Evaluation

## 2019-01-22 NOTE — Progress Notes (Signed)
Recreation Therapy Notes   Date: 01/22/2019  Time: 9:30 am   Location: Craft room   Behavioral response: N/A   Intervention Topic: Relaxation  Discussion/Intervention: Patient did not attend group.   Clinical Observations/Feedback:  Patient did not attend group.   Aliou Mealey LRT/CTRS        Domingo Fuson 01/22/2019 11:27 AM

## 2019-01-22 NOTE — [facility] Group Notes (Signed)

## 2019-01-22 NOTE — H&P (Signed)
Jonathan Alexander. is an 33 y.o. male.   Chief Complaint: mania HPI: schizoaffective disorder  Past Medical History:  Diagnosis Date  . Schizophrenia ([place])   . Tachycardia     No past surgical history on file.  Family History  Family history unknown: Yes   Social History:  reports that he has been smoking cigarettes. He has a 4.00 pack-year smoking history. He has never used smokeless tobacco. He reports that he does not drink alcohol or use drugs.  Allergies:  Allergies  Allergen Reactions  . Divalproex Sodium   . Shellfish-Derived Products     (Not in a hospital admission)   Results for orders placed or performed during the hospital encounter of 01/13/19 (from the past 48 hour(s))  Glucose, capillary     Status: Abnormal   Collection Time: 01/22/19  7:00 AM  Result Value Ref Range   Glucose-Capillary 103 (H) 70 - 99 mg/dL   No results found.  Review of Systems  Constitutional: Negative.   HENT: Negative.   Eyes: Negative.   Respiratory: Negative.   Cardiovascular: Negative.   Gastrointestinal: Negative.   Musculoskeletal: Negative.   Skin: Negative.   Neurological: Negative.   Psychiatric/Behavioral: Negative.     There were no vitals taken for this visit. Physical Exam  Nursing note and vitals reviewed. Constitutional: He appears well-developed and well-nourished.  HENT:  Head: Normocephalic and atraumatic.  Eyes: Pupils are equal, round, and reactive to light. Conjunctivae are normal.  Neck: Normal range of motion.  Cardiovascular: Regular rhythm and normal heart sounds.  Respiratory: Effort normal.  GI: Soft.  Musculoskeletal: Normal range of motion.  Neurological: He is alert.  Skin: Skin is warm and dry.  Psychiatric: He has a normal mood and affect. His behavior is normal. Judgment and thought content normal.     Assessment/Plan Still manic but responding  Alethia Berthold, MD 01/22/2019, 9:51 AM

## 2019-01-22 NOTE — Plan of Care (Signed)
Patient had ECT today.Patient is appropriate with staff and peers most of the time.Patient was redirectable with his inappropriate talks at times.Compliant with medications.Attended groups.Appetite and energy level good.Support and encouragement given.

## 2019-01-22 NOTE — Transfer of Care (Signed)
Immediate Anesthesia Transfer of Care Note  Patient: Jonathan Alexander.  Procedure(s) Performed: ECT TX  Patient Location: PACU  Anesthesia Type:General  Level of Consciousness: sedated  Airway & Oxygen Therapy: Patient Spontanous Breathing and Patient connected to face mask oxygen  Post-op Assessment: Report given to RN and Post -op Vital signs reviewed and stable  Post vital signs: Reviewed and stable  Last Vitals:  Vitals Value Taken Time  BP 136/100 01/22/19 1027  Temp 37 C 01/22/19 1027  Pulse 118 01/22/19 1030  Resp 15 01/22/19 1030  SpO2 100 % 01/22/19 1030  Vitals shown include unvalidated device data.  Last Pain:  Vitals:   01/22/19 1027  TempSrc:   PainSc: Asleep         Complications: No apparent anesthesia complications

## 2019-01-23 MED ORDER — IMIPRAMINE HCL 50 MG PO TABS
100.0000 mg | ORAL_TABLET | Freq: Every day | ORAL | Status: DC
  Administered 2019-01-23 – 2019-01-25 (×3): 100 mg via ORAL
  Filled 2019-01-23 (×4): qty 2

## 2019-01-23 NOTE — Plan of Care (Signed)
  Problem: Health Behavior/Discharge Planning: Goal: Compliance with therapeutic regimen will improve Outcome: Progressing  Patient verbalized feelings to staff.

## 2019-01-23 NOTE — [facility] Group Notes (Signed)
LCSW Group Therapy Note  01/23/2019  1:00 PM   Type of Therapy and Topic:  Group Therapy:  Trust and Honesty   Participation Level:  Minimal   Description of Group:    In this group patients will be asked to explore the value of being honest.  Patients will be guided to discuss their thoughts, feelings, and behaviors related to honesty and trusting in others. Patients will process together how trust and honesty relate to forming relationships with peers, family members, and self. Each patient will be challenged to identify and express feelings of being vulnerable. Patients will discuss reasons why people are dishonest and identify alternative outcomes if one was truthful (to self or others). This group will be process-oriented, with patients participating in exploration of their own experiences, giving and receiving support, and processing challenge from other group members.   Therapeutic Goals: 1. Patient will identify why honesty is important to relationships and how honesty overall affects relationships.  2. Patient will identify a situation where they lied or were lied too and the  feelings, thought process, and behaviors surrounding the situation 3. Patient will identify the meaning of being vulnerable, how that feels, and how that correlates to being honest with self and others. 4. Patient will identify situations where they could have told the truth, but instead lied and explain reasons of dishonesty.   Summary of Patient Progress:  Patient was present in group.  Patient often asked to share in group, however the topics he wished to share were not group related.  Patient had to be redirected often.  Patient presented with preoccupied thoughts on God and religion.     Therapeutic Modalities:   Cognitive Behavioral Therapy Solution Focused Therapy Motivational Interviewing Brief Therapy  Assunta Curtis, MSW, LCSW 01/23/2019 12:27 PM

## 2019-01-23 NOTE — Plan of Care (Signed)
Pt. Interactions with staff and peers more appropriate. Pt. Participation improved around the unit. Pt. Complaint with medications. Pt. Denies si/hi/avh, able to contract for safety. Pt. Endorses an improved mood.    Problem: Education: Goal: Emotional status will improve Outcome: Progressing Goal: Mental status will improve Outcome: Progressing   Problem: Activity: Goal: Interest or engagement in activities will improve Outcome: Progressing   Problem: Coping: Goal: Coping ability will improve Outcome: Progressing Goal: Will verbalize feelings Outcome: Progressing   Problem: Health Behavior/Discharge Planning: Goal: Compliance with therapeutic regimen will improve Outcome: Progressing   Problem: Safety: Goal: Ability to disclose and discuss suicidal ideas will improve Outcome: Progressing

## 2019-01-23 NOTE — Progress Notes (Signed)
[facility]  01/23/2019 12:46 PM Jonathan Horton MarshallSantana Jr.  MRN:  [id number] Subjective: Patient seen and chart reviewed.  Patient with schizoaffective disorder who has been quite manic recently.  Patient was very interested in talking with me today as usual.  Wanted to talk about a variety of topics including angels.  However he did not get too agitated.  When I responded with quiet listening he settled down.  Denied any suicidal or homicidal thoughts. Principal Problem: Schizoaffective disorder, bipolar type (HCC) Diagnosis: Principal Problem:   Schizoaffective disorder, bipolar type (HCC) Active Problems:   Hypertension   Schizophrenia (HCC)  Total Time spent with patient: 30 minutes  Past Psychiatric History: Past history of bipolar disorder with long hospitalizations  Past Medical History:  Past Medical History:  Diagnosis Date  . Schizophrenia (HCC)   . Tachycardia    History reviewed. No pertinent surgical history. Family History:  Family History  Family history unknown: Yes   Family Psychiatric  History: See previous Social History:  Social History   Substance and Sexual Activity  Alcohol Use No     Social History   Substance and Sexual Activity  Drug Use No    Social History   Socioeconomic History  . Marital status: Single    Spouse name: Not on file  . Number of children: Not on file  . Years of education: Not on file  . Highest education level: Not on file  Occupational History  . Not on file  Social Needs  . Financial resource strain: Not on file  . Food insecurity    Worry: Not on file    Inability: Not on file  . Transportation needs    Medical: Not on file    Non-medical: Not on file  Tobacco Use  . Smoking status: Current Every Day Smoker    Packs/day: 1.00    Years: 4.00    Pack years: 4.00    Types: Cigarettes  . Smokeless tobacco: Never Used  Substance and Sexual Activity  . Alcohol use: No  . Drug use: No  . Sexual activity:  Never  Lifestyle  . Physical activity    Days per week: Not on file    Minutes per session: Not on file  . Stress: Not on file  Relationships  . Social Musicianconnections    Talks on phone: Not on file    Gets together: Not on file    Attends religious service: Not on file    Active member of club or organization: Not on file    Attends meetings of clubs or organizations: Not on file    Relationship status: Not on file  Other Topics Concern  . Not on file  Social History Narrative  . Not on file   Additional Social History:    Pain Medications: see PTA Prescriptions: see PTA Over the Counter: see PTA History of alcohol / drug use?: No history of alcohol / drug abuse Negative Consequences of Use: Personal relationships Withdrawal Symptoms: Other (Comment)(none)                    Sleep: Fair  Appetite:  Fair  Current Medications: Current Facility-Administered Medications  Medication Dose Route Frequency Provider Last Rate Last Dose  . acetaminophen (TYLENOL) tablet 650 mg  650 mg Oral Q6H PRN [name], Jacqueline, NP      . alum & mag hydroxide-simeth (MAALOX/MYLANTA) 200-200-20 MG/5ML suspension 30 mL  30 mL Oral Q4H PRN [name], Jacqueline, NP      .  clonazePAM (KLONOPIN) tablet 1 mg  1 mg Oral TID Sher Hellinger, Jackquline DenmarkJohn T, MD   1 mg at 01/23/19 1150  . cloZAPine (CLOZARIL) tablet 350 mg  350 mg Oral QHS Fares Ramthun, Jackquline DenmarkJohn T, MD   350 mg at 01/22/19 2229  . desmopressin (DDAVP) tablet 0.2 mg  0.2 mg Oral QHS Thomspon, Adela LankJacqueline, NP   0.2 mg at 01/22/19 2231  . EPINEPHrine (EPI-PEN) injection 0.3 mg  0.3 mg Intramuscular UD Thomspon, Jacqueline, NP      . ferrous sulfate tablet 325 mg  325 mg Oral Q breakfast [name], Jacqueline, NP   325 mg at 01/23/19 0815  . glycopyrrolate (ROBINUL) injection 0.1 mg  0.1 mg Intravenous Once Johnni Wunschel T, MD      . imipramine (TOFRANIL) tablet 100 mg  100 mg Oral QHS Canesha Tesfaye T, MD      . lithium carbonate (ESKALITH) CR tablet 900 mg  900  mg Oral QHS Isatou Agredano, Jackquline DenmarkJohn T, MD   900 mg at 01/22/19 2229  . loratadine (CLARITIN) tablet 10 mg  10 mg Oral Daily [name], Jacqueline, NP   10 mg at 01/23/19 0815  . magnesium hydroxide (MILK OF MAGNESIA) suspension 30 mL  30 mL Oral Daily PRN [name], Jacqueline, NP      . metFORMIN (GLUCOPHAGE) tablet 500 mg  500 mg Oral BID [name], Adela LankJacqueline, NP   500 mg at 01/23/19 0815  . metoprolol succinate (TOPROL-XL) 24 hr tablet 100 mg  100 mg Oral Daily Kristoph Sattler, Jackquline DenmarkJohn T, MD   100 mg at 01/23/19 0815  . OLANZapine (ZYPREXA) tablet 20 mg  20 mg Oral QHS Cheray Pardi, Jackquline DenmarkJohn T, MD   20 mg at 01/22/19 2228  . polyethylene glycol (MIRALAX / GLYCOLAX) packet 17 g  1 packet Oral q morning - 10a Thomspon, Adela LankJacqueline, NP   17 g at 01/23/19 1003  . venlafaxine XR (EFFEXOR-XR) 24 hr capsule 75 mg  75 mg Oral Q breakfast [name], Jacqueline, NP   75 mg at 01/23/19 16100815    Lab Results:  Results for orders placed or performed during the hospital encounter of 01/13/19 (from the past 48 hour(s))  Glucose, capillary     Status: Abnormal   Collection Time: 01/22/19  7:00 AM  Result Value Ref Range   Glucose-Capillary 103 (H) 70 - 99 mg/dL    Blood Alcohol level:  Lab Results  Component Value Date   ETH <10 01/12/2019   ETH <10 02/09/2018    Metabolic Disorder Labs: Lab Results  Component Value Date   HGBA1C 5.0 02/09/2018   MPG 97 02/09/2018   No results found for: PROLACTIN Lab Results  Component Value Date   CHOL 167 02/09/2018   TRIG 169 (H) 02/09/2018   HDL 38 (L) 02/09/2018   CHOLHDL 4.4 02/09/2018   VLDL 34 02/09/2018   LDLCALC 95 02/09/2018    Physical Findings: AIMS: Facial and Oral Movements Muscles of Facial Expression: None, normal Lips and Perioral Area: Minimal Jaw: None, normal Tongue: Mild,Extremity Movements Upper (arms, wrists, hands, fingers): Mild Lower (legs, knees, ankles, toes): None, normal, Trunk Movements Neck, shoulders, hips: None, normal, Overall  Severity Severity of abnormal movements (highest score from questions above): Mild Incapacitation due to abnormal movements: None, normal Patient's awareness of abnormal movements (rate only patient's report): Aware, no distress, Dental Status Current problems with teeth and/or dentures?: No Does patient usually wear dentures?: No  CIWA:  CIWA-Ar Total: 0 COWS:  COWS Total Score: 1  Musculoskeletal: Strength & Muscle  Tone: within normal limits Gait & Station: normal Patient leans: N/A  Psychiatric Specialty Exam: Physical Exam  Nursing note and vitals reviewed. Constitutional: He appears well-developed and well-nourished.  HENT:  Head: Normocephalic and atraumatic.  Eyes: Pupils are equal, round, and reactive to light. Conjunctivae are normal.  Neck: Normal range of motion.  Cardiovascular: Regular rhythm and normal heart sounds.  Respiratory: Effort normal. No respiratory distress.  GI: Soft.  Musculoskeletal: Normal range of motion.  Neurological: He is alert.  Skin: Skin is warm and dry.  Psychiatric: His affect is labile. His affect is not blunt. His speech is tangential. He is agitated. He is not aggressive. Cognition and memory are impaired. He expresses impulsivity.    Review of Systems  Constitutional: Negative.   HENT: Negative.   Eyes: Negative.   Respiratory: Negative.   Cardiovascular: Negative.   Gastrointestinal: Negative.   Musculoskeletal: Negative.   Skin: Negative.   Neurological: Negative.   Psychiatric/Behavioral: Negative.     Blood pressure 107/81, pulse 95, temperature 97.7 F (36.5 C), temperature source Oral, resp. rate 18, height 5' 11.7" (1.821 m), weight 95.3 kg, SpO2 100 %.Body mass index is 28.72 kg/m.  General Appearance: Fairly Groomed  Eye Contact:  Fair  Speech:  Clear and Coherent  Volume:  Decreased  Mood:  Euphoric  Affect:  Labile  Thought Process:  Disorganized  Orientation:  Full (Time, Place, and Person)  Thought Content:   Illogical  Suicidal Thoughts:  No  Homicidal Thoughts:  No  Memory:  Immediate;   Fair Recent;   Fair Remote;   Fair  Judgement:  Fair  Insight:  Fair  Psychomotor Activity:  Decreased  Concentration:  Concentration: Fair  Recall:  [employer] of Knowledge:  Fair  Language:  Fair  Akathisia:  No  Handed:  Right  AIMS (if indicated):     Assets:  Desire for Improvement Housing Physical Health  ADL's:  Impaired  Cognition:  Impaired,  Mild  Sleep:  Number of Hours: 6.45     Treatment Plan Summary: Daily contact with patient to assess and evaluate symptoms and progress in treatment, Medication management and Plan Gradually getting a little better.  Tolerating the clozapine.  ECT seems to have helped.  We are tentatively planning on not scheduling ECT for Monday as he is getting better and might be getting closer to discharge.  I looked over his medicines and saw that he was still on Tofranil.  I have never quite understood the utility of this and the patient and I wonder if it may be driving some of the manic behavior.  I am going to cut down the dose a little bit.  No other change to medicine today.  Alethia Berthold, MD 01/23/2019, 12:46 PM

## 2019-01-23 NOTE — Progress Notes (Signed)
Patient alert and oriented x 3, affect is blunted and assertive, he is S/P ECT some confusion noted to situation, patient was reoriented and redirected as needed. Patient is compliant with  treatment plan, he took his prescribed medication regimen and he is interacting appropriately with peers and staff. 15 minutes safety checks maintained will continue to monitor.

## 2019-01-23 NOTE — Progress Notes (Signed)
D: Pt. During assessments is animated and euphoric, describing his mood as, "great!". Pt. Thought process is disorganized, but with redirection able to participate in assessments for the most part. Pt. Has no complaints. Denies pain.     A: Q x 15 minute observation checks to be completed for safety. Patient was provided with education.  Patient was given/offered medications per orders. Patient  was encouraged to attend groups, participate in unit activities and continue with plan of care for the day. Pt. Chart and plans of care reviewed. Pt. Given support and encouragement.   R: Patient is complaint with medications and unit procedures this morning, attends breakfast, observed eating good. Pt. Interactions with staff and peers observed improved.

## 2019-01-24 NOTE — Plan of Care (Signed)
D- Patient alert and oriented. Patient presents in a pleasant mood on assessment gesturing that he slept so-so last night because of "dreams". Patient denies SI, HI, AVH, and pain at this time. Patient also denies any signs/symptoms of depression/anxiety stating to this writer "I'm fine". Patient has not been inappropriate with this writer nor hypersexual like he was last week, he has improved greatly in that aspect. Patient's goal for today is to "stay happy".   A- Scheduled medications administered to patient, per MD orders. Support and encouragement provided.  Routine safety checks conducted every 15 minutes.  Patient informed to notify staff with problems or concerns.  R- No adverse drug reactions noted. Patient contracts for safety at this time. Patient compliant with medications and treatment plan. Patient receptive, calm, and cooperative. Patient interacts well with others on the unit.  Patient remains safe at this time.  Problem: Education: Goal: Knowledge of [facility] Education information/materials will improve Outcome: Progressing Goal: Emotional status will improve Outcome: Progressing Goal: Mental status will improve Outcome: Progressing Goal: Verbalization of understanding the information provided will improve Outcome: Progressing   Problem: Activity: Goal: Interest or engagement in activities will improve Outcome: Progressing Goal: Sleeping patterns will improve Outcome: Progressing   Problem: Coping: Goal: Coping ability will improve Outcome: Progressing Goal: Will verbalize feelings Outcome: Progressing   Problem: Health Behavior/Discharge Planning: Goal: Ability to make decisions will improve Outcome: Progressing Goal: Compliance with therapeutic regimen will improve Outcome: Progressing   Problem: Safety: Goal: Ability to disclose and discuss suicidal ideas will improve Outcome: Progressing Goal: Ability to identify and utilize support systems that  promote safety will improve Outcome: Progressing

## 2019-01-24 NOTE — Progress Notes (Signed)
Patient has been doing well with not being inappropriate, however, he just came up to this writer and stated "why you come back" and this writer informed patient that I would be staying over for a little while and he stated "not for me"? Patient proceeds to turn around to look and see if anyone was around and states "can I kiss your feet".

## 2019-01-24 NOTE — [facility] Group Notes (Signed)
LCSW Group Therapy Note  01/24/2019 9:07 AM   Type of Therapy and Topic: Group Therapy: Feelings Around Returning Home & Establishing a Supportive Framework and Supporting Oneself When Supports Not Available   Participation Level:  Active   Description of Group:  Patients first processed thoughts and feelings about upcoming discharge. These included fears of upcoming changes, lack of change, new living environments, judgements and expectations from others and overall stigma of mental health issues. The group then discussed the definition of a supportive framework, what that looks and feels like, and how do to discern it from an unhealthy non-supportive network. The group identified different types of supports as well as what to do when your family/friends are less than helpful or unavailable   Therapeutic Goals  1. Patient will identify one healthy supportive network that they can use at discharge. 2. Patient will identify one factor of a supportive framework and how to tell it from an unhealthy network. 3. Patient able to identify one coping skill to use when they do not have positive supports from others. 4. Patient will demonstrate ability to communicate their needs through discussion and/or role plays.   Summary of Patient Progress:  Pt was appropriate and respectful in group. Pt had difficulty staying on topic and often diverted the conversation to "God" and/or the bible. Pt identified himself as a support and reported that God and testimony are also supports. Pt reported that a positive support system is "wisdom". Pt at times had to be redirected as he was getting off topic.     Therapeutic Modalities Cognitive Behavioral Therapy Motivational Interviewing    Evalina Field, MSW, LCSW Clinical Social Work 01/24/2019 9:07 AM

## 2019-01-24 NOTE — Progress Notes (Signed)
[facility]  01/24/2019 12:56 PM Jonathan Horton MarshallSantana Jr.  MRN:  [id number] Subjective: Follow-up patient with schizoaffective disorder with manic presentation.  Continues to show improvement.  Slept well last night.  Not agitated today.  Still little hyperverbal and tangential and euphoric but not causing any behavior problems.  Tolerating medication. Principal Problem: Schizoaffective disorder, bipolar type (HCC) Diagnosis: Principal Problem:   Schizoaffective disorder, bipolar type (HCC) Active Problems:   Hypertension   Schizophrenia (HCC)  Total Time spent with patient: 30 minutes  Past Psychiatric History: Past history of schizoaffective disorder with lengthy hospitalizations  Past Medical History:  Past Medical History:  Diagnosis Date  . Schizophrenia (HCC)   . Tachycardia    History reviewed. No pertinent surgical history. Family History:  Family History  Family history unknown: Yes   Family Psychiatric  History: See previous Social History:  Social History   Substance and Sexual Activity  Alcohol Use No     Social History   Substance and Sexual Activity  Drug Use No    Social History   Socioeconomic History  . Marital status: Single    Spouse name: Not on file  . Number of children: Not on file  . Years of education: Not on file  . Highest education level: Not on file  Occupational History  . Not on file  Social Needs  . Financial resource strain: Not on file  . Food insecurity    Worry: Not on file    Inability: Not on file  . Transportation needs    Medical: Not on file    Non-medical: Not on file  Tobacco Use  . Smoking status: Current Every Day Smoker    Packs/day: 1.00    Years: 4.00    Pack years: 4.00    Types: Cigarettes  . Smokeless tobacco: Never Used  Substance and Sexual Activity  . Alcohol use: No  . Drug use: No  . Sexual activity: Never  Lifestyle  . Physical activity    Days per week: Not on file    Minutes per  session: Not on file  . Stress: Not on file  Relationships  . Social Musicianconnections    Talks on phone: Not on file    Gets together: Not on file    Attends religious service: Not on file    Active member of club or organization: Not on file    Attends meetings of clubs or organizations: Not on file    Relationship status: Not on file  Other Topics Concern  . Not on file  Social History Narrative  . Not on file   Additional Social History:    Pain Medications: see PTA Prescriptions: see PTA Over the Counter: see PTA History of alcohol / drug use?: No history of alcohol / drug abuse Negative Consequences of Use: Personal relationships Withdrawal Symptoms: Other (Comment)(none)                    Sleep: Good  Appetite:  Good  Current Medications: Current Facility-Administered Medications  Medication Dose Route Frequency Provider Last Rate Last Dose  . acetaminophen (TYLENOL) tablet 650 mg  650 mg Oral Q6H PRN [name], Jacqueline, NP      . alum & mag hydroxide-simeth (MAALOX/MYLANTA) 200-200-20 MG/5ML suspension 30 mL  30 mL Oral Q4H PRN Thomspon, Adela LankJacqueline, NP      . clonazePAM (KLONOPIN) tablet 1 mg  1 mg Oral TID , Jackquline DenmarkJohn T, MD   1 mg at  01/24/19 1219  . cloZAPine (CLOZARIL) tablet 350 mg  350 mg Oral QHS , Jackquline Denmark T, MD   350 mg at 01/23/19 2137  . desmopressin (DDAVP) tablet 0.2 mg  0.2 mg Oral QHS Thomspon, Adela LankJacqueline, NP   0.2 mg at 01/23/19 2136  . EPINEPHrine (EPI-PEN) injection 0.3 mg  0.3 mg Intramuscular UD Thomspon, Jacqueline, NP      . ferrous sulfate tablet 325 mg  325 mg Oral Q breakfast [name], Jacqueline, NP   325 mg at 01/24/19 0849  . glycopyrrolate (ROBINUL) injection 0.1 mg  0.1 mg Intravenous Once ,  T, MD      . imipramine (TOFRANIL) tablet 100 mg  100 mg Oral QHS , Jackquline Denmark T, MD   100 mg at 01/23/19 2136  . lithium carbonate (ESKALITH) CR tablet 900 mg  900 mg Oral QHS , Jackquline Denmark T, MD   900 mg at 01/23/19 2136  .  loratadine (CLARITIN) tablet 10 mg  10 mg Oral Daily [name], Jacqueline, NP   10 mg at 01/24/19 0850  . magnesium hydroxide (MILK OF MAGNESIA) suspension 30 mL  30 mL Oral Daily PRN [name], Jacqueline, NP      . metFORMIN (GLUCOPHAGE) tablet 500 mg  500 mg Oral BID [name], Adela LankJacqueline, NP   500 mg at 01/24/19 0850  . metoprolol succinate (TOPROL-XL) 24 hr tablet 100 mg  100 mg Oral Daily , Jackquline Denmark T, MD   100 mg at 01/24/19 0849  . OLANZapine (ZYPREXA) tablet 20 mg  20 mg Oral QHS , Jackquline Denmark T, MD   20 mg at 01/23/19 2137  . polyethylene glycol (MIRALAX / GLYCOLAX) packet 17 g  1 packet Oral q morning - 10a Thomspon, Adela LankJacqueline, NP   17 g at 01/23/19 1003  . venlafaxine XR (EFFEXOR-XR) 24 hr capsule 75 mg  75 mg Oral Q breakfast [name], Jacqueline, NP   75 mg at 01/24/19 16100850    Lab Results: No results found for this or any previous visit (from the past 48 hour(s)).  Blood Alcohol level:  Lab Results  Component Value Date   ETH <10 01/12/2019   ETH <10 02/09/2018    Metabolic Disorder Labs: Lab Results  Component Value Date   HGBA1C 5.0 02/09/2018   MPG 97 02/09/2018   No results found for: PROLACTIN Lab Results  Component Value Date   CHOL 167 02/09/2018   TRIG 169 (H) 02/09/2018   HDL 38 (L) 02/09/2018   CHOLHDL 4.4 02/09/2018   VLDL 34 02/09/2018   LDLCALC 95 02/09/2018    Physical Findings: AIMS: Facial and Oral Movements Muscles of Facial Expression: None, normal Lips and Perioral Area: Minimal Jaw: None, normal Tongue: Mild,Extremity Movements Upper (arms, wrists, hands, fingers): Mild Lower (legs, knees, ankles, toes): None, normal, Trunk Movements Neck, shoulders, hips: None, normal, Overall Severity Severity of abnormal movements (highest score from questions above): Mild Incapacitation due to abnormal movements: None, normal Patient's awareness of abnormal movements (rate only patient's report): Aware, no distress, Dental Status Current  problems with teeth and/or dentures?: No Does patient usually wear dentures?: No  CIWA:  CIWA-Ar Total: 0 COWS:  COWS Total Score: 1  Musculoskeletal: Strength & Muscle Tone: within normal limits Gait & Station: normal Patient leans: N/A  Psychiatric Specialty Exam: Physical Exam  Nursing note and vitals reviewed. Constitutional: He appears well-developed and well-nourished.  HENT:  Head: Normocephalic and atraumatic.  Eyes: Pupils are equal, round, and reactive to light. Conjunctivae are normal.  Neck: Normal range  of motion.  Cardiovascular: Regular rhythm and normal heart sounds.  Respiratory: Effort normal.  GI: Soft.  Musculoskeletal: Normal range of motion.  Neurological: He is alert.  Skin: Skin is warm and dry.  Psychiatric: His affect is labile. His speech is tangential. He is not agitated. Thought content is not paranoid. He expresses impulsivity. He expresses no homicidal and no suicidal ideation.    Review of Systems  Constitutional: Negative.   HENT: Negative.   Eyes: Negative.   Respiratory: Negative.   Cardiovascular: Negative.   Gastrointestinal: Negative.   Musculoskeletal: Negative.   Skin: Negative.   Neurological: Negative.   Psychiatric/Behavioral: Negative.     Blood pressure 104/77, pulse 93, temperature 98 F (36.7 C), temperature source Oral, resp. rate 16, height 5' 11.7" (1.821 m), weight 95.3 kg, SpO2 98 %.Body mass index is 28.72 kg/m.  General Appearance: Casual  Eye Contact:  Good  Speech:  Pressured  Volume:  Increased  Mood:  Anxious and Euphoric  Affect:  Congruent  Thought Process:  Disorganized  Orientation:  Full (Time, Place, and Person)  Thought Content:  Paranoid Ideation and Tangential  Suicidal Thoughts:  No  Homicidal Thoughts:  No  Memory:  Immediate;   Fair Recent;   Fair Remote;   Fair  Judgement:  Impaired  Insight:  Shallow  Psychomotor Activity:  Increased  Concentration:  Concentration: Fair  Recall:  [employer] of Knowledge:  Fair  Language:  Fair  Akathisia:  No  Handed:  Right  AIMS (if indicated):     Assets:  Desire for Improvement Housing  ADL's:  Intact  Cognition:  Impaired,  Mild  Sleep:  Number of Hours: 7.25     Treatment Plan Summary: Daily contact with patient to assess and evaluate symptoms and progress in treatment, Medication management and Plan Patient is doing so well we are going to defer ECT tomorrow and hope that we may be able to look at discharge in another 1 to 2 days no further change to medicine today  Alethia Berthold, MD 01/24/2019, 12:56 PM

## 2019-01-24 NOTE — Plan of Care (Signed)
  Problem: Education: Goal: Knowledge of [facility] Education information/materials will improve Outcome: Progressing Goal: Emotional status will improve Outcome: Progressing Goal: Mental status will improve Outcome: Progressing Goal: Verbalization of understanding the information provided will improve Outcome: Progressing  D: Patient has been interacting appropriately with patients and staff. Denies SI, HI and AVH. Drawing pictures. No sexually inappropriate comments.  A: Continue to monitor for safety. R: Safety maintained.

## 2019-01-24 NOTE — Progress Notes (Signed)
D: Patient has been interacting appropriately with patients and staff. Denies SI, HI and AVH. Drawing pictures. No sexually inappropriate comments.  A: Continue to monitor for safety. R: Safety maintained.

## 2019-01-25 LAB — GLUCOSE, CAPILLARY: Glucose-Capillary: 94 mg/dL (ref 70–99)

## 2019-01-25 NOTE — Progress Notes (Signed)
[facility]  01/25/2019 5:46 PM Jonathan Horton MarshallSantana Jr.  MRN:  [id number] Subjective: Patient seen and chart reviewed.  Patient has no new complaints today.  He again states that he is doing "great".  He tends to wander off onto religious topics offering blessings to people and telling me he is worried about my spiritual welfare but he is not aggressive or intrusive about it.  He has been able to sit still and stay quiet appropriately for much of the day.  No evidence of any dangerousness violence or suicidality.  Appears to be tolerating medicine fine. Principal Problem: Schizoaffective disorder, bipolar type (HCC) Diagnosis: Principal Problem:   Schizoaffective disorder, bipolar type (HCC) Active Problems:   Hypertension   Schizophrenia (HCC)  Total Time spent with patient: 30 minutes  Past Psychiatric History: Past history of schizoaffective disorder with multiple hospitalizations  Past Medical History:  Past Medical History:  Diagnosis Date  . Schizophrenia (HCC)   . Tachycardia    History reviewed. No pertinent surgical history. Family History:  Family History  Family history unknown: Yes   Family Psychiatric  History: See previous Social History:  Social History   Substance and Sexual Activity  Alcohol Use No     Social History   Substance and Sexual Activity  Drug Use No    Social History   Socioeconomic History  . Marital status: Single    Spouse name: Not on file  . Number of children: Not on file  . Years of education: Not on file  . Highest education level: Not on file  Occupational History  . Not on file  Social Needs  . Financial resource strain: Not on file  . Food insecurity    Worry: Not on file    Inability: Not on file  . Transportation needs    Medical: Not on file    Non-medical: Not on file  Tobacco Use  . Smoking status: Current Every Day Smoker    Packs/day: 1.00    Years: 4.00    Pack years: 4.00    Types: Cigarettes  .  Smokeless tobacco: Never Used  Substance and Sexual Activity  . Alcohol use: No  . Drug use: No  . Sexual activity: Never  Lifestyle  . Physical activity    Days per week: Not on file    Minutes per session: Not on file  . Stress: Not on file  Relationships  . Social Musicianconnections    Talks on phone: Not on file    Gets together: Not on file    Attends religious service: Not on file    Active member of club or organization: Not on file    Attends meetings of clubs or organizations: Not on file    Relationship status: Not on file  Other Topics Concern  . Not on file  Social History Narrative  . Not on file   Additional Social History:    Pain Medications: see PTA Prescriptions: see PTA Over the Counter: see PTA History of alcohol / drug use?: No history of alcohol / drug abuse Negative Consequences of Use: Personal relationships Withdrawal Symptoms: Other (Comment)(none)                    Sleep: Fair  Appetite:  Fair  Current Medications: Current Facility-Administered Medications  Medication Dose Route Frequency Provider Last Rate Last Dose  . acetaminophen (TYLENOL) tablet 650 mg  650 mg Oral Q6H PRN [name], Jacqueline, NP      .  alum & mag hydroxide-simeth (MAALOX/MYLANTA) 200-200-20 MG/5ML suspension 30 mL  30 mL Oral Q4H PRN Thomspon, Geni Bers, NP      . clonazePAM (KLONOPIN) tablet 1 mg  1 mg Oral TID , Madie Reno, MD   1 mg at 01/25/19 1657  . cloZAPine (CLOZARIL) tablet 350 mg  350 mg Oral QHS , Madie Reno, MD   350 mg at 01/24/19 2122  . desmopressin (DDAVP) tablet 0.2 mg  0.2 mg Oral QHS Thomspon, Geni Bers, NP   0.2 mg at 01/24/19 2122  . EPINEPHrine (EPI-PEN) injection 0.3 mg  0.3 mg Intramuscular UD Thomspon, Jacqueline, NP      . ferrous sulfate tablet 325 mg  325 mg Oral Q breakfast Lamont Dowdy, NP   325 mg at 01/25/19 1257  . glycopyrrolate (ROBINUL) injection 0.1 mg  0.1 mg Intravenous Once ,  T, MD      . imipramine  (TOFRANIL) tablet 100 mg  100 mg Oral QHS , Madie Reno, MD   100 mg at 01/24/19 2123  . lithium carbonate (ESKALITH) CR tablet 900 mg  900 mg Oral QHS , Madie Reno, MD   900 mg at 01/24/19 2122  . loratadine (CLARITIN) tablet 10 mg  10 mg Oral Daily Lamont Dowdy, NP   10 mg at 01/25/19 1257  . magnesium hydroxide (MILK OF MAGNESIA) suspension 30 mL  30 mL Oral Daily PRN Lamont Dowdy, NP      . metFORMIN (GLUCOPHAGE) tablet 500 mg  500 mg Oral BID [name], Geni Bers, NP   500 mg at 01/25/19 1657  . metoprolol succinate (TOPROL-XL) 24 hr tablet 100 mg  100 mg Oral Daily ,  T, MD   100 mg at 01/25/19 1255  . OLANZapine (ZYPREXA) tablet 20 mg  20 mg Oral QHS , Madie Reno, MD   20 mg at 01/24/19 2122  . polyethylene glycol (MIRALAX / GLYCOLAX) packet 17 g  1 packet Oral q morning - 10a Thomspon, Geni Bers, NP   17 g at 01/25/19 1257  . venlafaxine XR (EFFEXOR-XR) 24 hr capsule 75 mg  75 mg Oral Q breakfast Lamont Dowdy, NP   75 mg at 01/25/19 1257    Lab Results:  Results for orders placed or performed during the hospital encounter of 01/13/19 (from the past 48 hour(s))  Glucose, capillary     Status: None   Collection Time: 01/25/19  6:23 AM  Result Value Ref Range   Glucose-Capillary 94 70 - 99 mg/dL    Blood Alcohol level:  Lab Results  Component Value Date   ETH <10 01/12/2019   ETH <10 08/07/7251    Metabolic Disorder Labs: Lab Results  Component Value Date   HGBA1C 5.0 02/09/2018   MPG 97 02/09/2018   No results found for: PROLACTIN Lab Results  Component Value Date   CHOL 167 02/09/2018   TRIG 169 (H) 02/09/2018   HDL 38 (L) 02/09/2018   CHOLHDL 4.4 02/09/2018   VLDL 34 02/09/2018   LDLCALC 95 02/09/2018    Physical Findings: AIMS: Facial and Oral Movements Muscles of Facial Expression: None, normal Lips and Perioral Area: Minimal Jaw: None, normal Tongue: Mild,Extremity Movements Upper (arms, wrists, hands,  fingers): Mild Lower (legs, knees, ankles, toes): None, normal, Trunk Movements Neck, shoulders, hips: None, normal, Overall Severity Severity of abnormal movements (highest score from questions above): Mild Incapacitation due to abnormal movements: None, normal Patient's awareness of abnormal movements (rate only patient's report): Aware, no distress, Dental Status Current problems with  teeth and/or dentures?: No Does patient usually wear dentures?: No  CIWA:  CIWA-Ar Total: 0 COWS:  COWS Total Score: 1  Musculoskeletal: Strength & Muscle Tone: within normal limits Gait & Station: normal Patient leans: N/A  Psychiatric Specialty Exam: Physical Exam  Nursing note and vitals reviewed. Constitutional: He appears well-developed and well-nourished.  HENT:  Head: Normocephalic and atraumatic.  Eyes: Pupils are equal, round, and reactive to light. Conjunctivae are normal.  Neck: Normal range of motion.  Cardiovascular: Normal heart sounds.  Respiratory: Effort normal.  GI: Soft.  Musculoskeletal: Normal range of motion.  Neurological: He is alert.  Skin: Skin is warm and dry.  Psychiatric: His speech is normal and behavior is normal. His affect is labile. Thought content is delusional. Cognition and memory are normal. He expresses impulsivity.    Review of Systems  Constitutional: Negative.   HENT: Negative.   Eyes: Negative.   Respiratory: Negative.   Cardiovascular: Negative.   Gastrointestinal: Negative.   Musculoskeletal: Negative.   Skin: Negative.   Neurological: Negative.   Psychiatric/Behavioral: Negative for depression, hallucinations, memory loss, substance abuse and suicidal ideas. The patient is nervous/anxious. The patient does not have insomnia.     Blood pressure 110/73, pulse (!) 113, temperature 97.7 F (36.5 C), temperature source Oral, resp. rate 16, height 5' 11.7" (1.821 m), weight 95.3 kg, SpO2 100 %.Body mass index is 28.72 kg/m.  General Appearance:  Casual  Eye Contact:  Fair  Speech:  Clear and Coherent  Volume:  Normal  Mood:  Euphoric  Affect:  Congruent  Thought Process:  Disorganized  Orientation:  Full (Time, Place, and Person)  Thought Content:  Rumination  Suicidal Thoughts:  No  Homicidal Thoughts:  No  Memory:  Immediate;   Fair Recent;   Fair Remote;   Fair  Judgement:  Fair  Insight:  Fair  Psychomotor Activity:  Normal  Concentration:  Concentration: Fair  Recall:  [employer]Fair  Fund of Knowledge:  Fair  Language:  Fair  Akathisia:  No  Handed:  Right  AIMS (if indicated):     Assets:  Desire for Improvement Housing Resilience  ADL's:  Intact  Cognition:  WNL and Impaired,  Mild  Sleep:  Number of Hours: 6.5     Treatment Plan Summary: Daily contact with patient to assess and evaluate symptoms and progress in treatment, Medication management and Plan Patient appears to have stabilized and is pretty much back to his baseline.  He did not receive ECT treatment this morning in order to minimize any cognitive side effects prior to discharge.  Plan at this point is for discharge probably tomorrow back to outpatient treatment.  We have put him on the schedule for ECT again in about 2 weeks.  Patient is agreeable to the plan.  No current sign of dangerousness.  Mordecai RasmussenJohn , MD 01/25/2019, 5:46 PM

## 2019-01-25 NOTE — BHH Suicide Risk Assessment (Signed)
[facility] Discharge Suicide Risk Assessment   Principal Problem: Schizoaffective disorder, bipolar type [facility]) Discharge Diagnoses: Principal Problem:   Schizoaffective disorder, bipolar type ([place]) Active Problems:   Hypertension   Schizophrenia ([place])   Total Time spent with patient: 45 minutes  Musculoskeletal: Strength & Muscle Tone: within normal limits Gait & Station: normal Patient leans: N/A  Psychiatric Specialty Exam: Review of Systems  Constitutional: Negative.   HENT: Negative.   Eyes: Negative.   Respiratory: Negative.   Cardiovascular: Negative.   Gastrointestinal: Negative.   Musculoskeletal: Negative.   Skin: Negative.   Neurological: Negative.   Psychiatric/Behavioral: Negative.     Blood pressure 110/73, pulse (!) 113, temperature 97.7 F (36.5 C), temperature source Oral, resp. rate 16, height 5' 11.7" (1.821 m), weight 95.3 kg, SpO2 100 %.Body mass index is 28.72 kg/m.  General Appearance: Casual  Eye Contact::  Good  Speech:  Clear and Coherent409  Volume:  Normal  Mood:  Euphoric  Affect:  Congruent  Thought Process:  Disorganized  Orientation:  Full (Time, Place, and Person)  Thought Content:  Rumination  Suicidal Thoughts:  No  Homicidal Thoughts:  No  Memory:  Immediate;   Fair Recent;   Fair Remote;   Fair  Judgement:  Impaired  Insight:  Shallow  Psychomotor Activity:  Normal  Concentration:  Fair  Recall:  [place]  Language: Fair  Akathisia:  No  Handed:  Right  AIMS (if indicated):     Assets:  Desire for Improvement Housing Physical Health  Sleep:  Number of Hours: 6.5  Cognition: Impaired,  Mild  ADL's:  Impaired   Mental Status Per Nursing Assessment::   On Admission:  NA  Demographic Factors:  Male  Loss Factors: Financial problems/change in socioeconomic status  Historical Factors: Impulsivity  Risk Reduction Factors:   Positive social support and Positive therapeutic relationship  Continued  Clinical Symptoms:  Schizophrenia:   Paranoid or undifferentiated type  Cognitive Features That Contribute To Risk:  Polarized thinking    Suicide Risk:  Minimal: No identifiable suicidal ideation.  Patients presenting with no risk factors but with morbid ruminations; may be classified as minimal risk based on the severity of the depressive symptoms  Follow-up Information    Services, Psychotherapeutic. Go on 01/26/2019.   Why: Your ACT team will follow up with you on 01/26/19. Thank you. Contact information: [address] [place] [address] [phone]           Plan Of Care/Follow-up recommendations:  Activity:  Activity as tolerated Diet:  Regular diet Other:  Follow-up with outpatient mental health with your act team and ECT  Alethia Berthold, MD 01/25/2019, 5:53 PM

## 2019-01-25 NOTE — BHH Counselor (Signed)
Pt is scheduled to discharge on 01/26/19. CSW contacted the pt's legal guardian and group home owner to inform them of the discharge plan. No one reports concerns at this time.  CSW left message with pt's ACTT leader at [facility](Alisha) informing her of the discharge plan.

## 2019-01-25 NOTE — Plan of Care (Signed)
  Problem: Education: Goal: Knowledge of [facility] Education information/materials will improve Outcome: Progressing Goal: Emotional status will improve Outcome: Progressing Goal: Mental status will improve Outcome: Progressing Goal: Verbalization of understanding the information provided will improve Outcome: Progressing   D: Patient has been interacting appropriately. Drawing pictures. Denies SI, HI and AVH. Mood is pleasant. Affect is appropriate to circumstance. Denies complaints. Hygiene is improved. A: Continue to monitor for safety. R: Safety maintained.

## 2019-01-25 NOTE — NC FL2 (Signed)
[place] LEVEL OF CARE SCREENING TOOL     IDENTIFICATION  Patient Name: Jonathan Alexander. Birthdate: [birth year] Sex: male Admission Date (Current Location): 01/13/2019  [place] and [place] Number:  Selena Lesser [id number] [place] and Address:  [facility], [address] [place], [place] [address]      Provider Number: [id number]  Attending Physician Name and Address:  Gonzella Lex, MD  Relative Name and Phone Number:  Delton See, legal guardian [phone]    Current Level of Care: Hospital Recommended Level of Care: Other (Comment)(group home) Prior Approval Number:    Date Approved/Denied:   PASRR Number:    Discharge Plan: Other (Comment)(group home)    Current Diagnoses: Patient Active Problem List   Diagnosis Date Noted  . Schizophrenia ([place]) 01/13/2019  . Schizoaffective disorder, bipolar type ([place]) 02/09/2018  . Hypertension 02/09/2018    Orientation RESPIRATION BLADDER Height & Weight     Self, Time, Situation, Place  Normal Continent Weight: 210 lb (95.3 kg) Height:  5' 11.7" (182.1 cm)  BEHAVIORAL SYMPTOMS/MOOD NEUROLOGICAL BOWEL NUTRITION STATUS  (None reported) (None reported) Continent (regular diet)  AMBULATORY STATUS COMMUNICATION OF NEEDS Skin   Independent Verbally Normal                       Personal Care Assistance Level of Assistance  (None reported)           Functional Limitations Info  (None reported)          SPECIAL CARE FACTORS FREQUENCY  (none reported)                    Contractures Contractures Info: Not present    Additional Factors Info  (full code)               Current Medications (01/25/2019):  This is the current hospital active medication list Current Facility-Administered Medications  Medication Dose Route Frequency Provider Last Rate Last Dose  . acetaminophen (TYLENOL) tablet 650 mg  650 mg Oral Q6H PRN Lamont Dowdy, NP      . alum  & mag hydroxide-simeth (MAALOX/MYLANTA) 200-200-20 MG/5ML suspension 30 mL  30 mL Oral Q4H PRN Thomspon, Geni Bers, NP      . clonazePAM (KLONOPIN) tablet 1 mg  1 mg Oral TID Clapacs, Madie Reno, MD   1 mg at 01/24/19 1624  . cloZAPine (CLOZARIL) tablet 350 mg  350 mg Oral QHS Clapacs, Madie Reno, MD   350 mg at 01/24/19 2122  . desmopressin (DDAVP) tablet 0.2 mg  0.2 mg Oral QHS Thomspon, Geni Bers, NP   0.2 mg at 01/24/19 2122  . EPINEPHrine (EPI-PEN) injection 0.3 mg  0.3 mg Intramuscular UD Thomspon, Jacqueline, NP      . ferrous sulfate tablet 325 mg  325 mg Oral Q breakfast Lamont Dowdy, NP   325 mg at 01/24/19 0849  . glycopyrrolate (ROBINUL) injection 0.1 mg  0.1 mg Intravenous Once Clapacs, John T, MD      . imipramine (TOFRANIL) tablet 100 mg  100 mg Oral QHS Clapacs, Madie Reno, MD   100 mg at 01/24/19 2123  . lithium carbonate (ESKALITH) CR tablet 900 mg  900 mg Oral QHS Clapacs, Madie Reno, MD   900 mg at 01/24/19 2122  . loratadine (CLARITIN) tablet 10 mg  10 mg Oral Daily Lamont Dowdy, NP   10 mg at 01/24/19 0850  . magnesium hydroxide (MILK OF MAGNESIA) suspension 30 mL  30  mL Oral Daily PRN [name], Jacqueline, NP      . metFORMIN (GLUCOPHAGE) tablet 500 mg  500 mg Oral BID [name], Adela LankJacqueline, NP   500 mg at 01/24/19 1624  . metoprolol succinate (TOPROL-XL) 24 hr tablet 100 mg  100 mg Oral Daily Clapacs, Jackquline DenmarkJohn T, MD   100 mg at 01/24/19 0849  . OLANZapine (ZYPREXA) tablet 20 mg  20 mg Oral QHS Clapacs, Jackquline DenmarkJohn T, MD   20 mg at 01/24/19 2122  . polyethylene glycol (MIRALAX / GLYCOLAX) packet 17 g  1 packet Oral q morning - 10a Thomspon, Adela LankJacqueline, NP   17 g at 01/23/19 1003  . venlafaxine XR (EFFEXOR-XR) 24 hr capsule 75 mg  75 mg Oral Q breakfast [name], Jacqueline, NP   75 mg at 01/24/19 [id number]     Discharge Medications: Please see discharge summary for a list of discharge medications.  Relevant Imaging Results:  Relevant Lab Results:   Additional  Information N/A  Suzan SlickARREN T Ysabela Keisler, LCSW

## 2019-01-25 NOTE — Progress Notes (Signed)
Patient's morning medication will be administered after he returns from ECT.
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
## 2019-01-25 NOTE — Progress Notes (Signed)
D - Patient was in the day room upon arrival to the unit. Patient was pleasant during assessment and medication administration. Patient denies SI/HI/AVH, pain, anxiety and depression with this Probation officer. Patient continues to be preoccupied with religion asking this Probation officer, "What do you know about God, angels and the bible?"  A - Patient compliant with medication administration per MD orders and procedures on the unit. Patient given education. Patient given support and encouragement to be active in his treatment plan. Patient informed to let staff know if there are any issues or problems on the unit.   R - Patient being monitored Q 15 minutes for safety per unit protocol. Patient remains safe on the unit.

## 2019-01-25 NOTE — Progress Notes (Signed)
Pastoral Care Visit   01/25/19 1745  Clinical Encounter Type  Visited With Patient  Visit Type Initial;Behavioral Health  Referral From Patient  Consult/Referral To Chaplain  Spiritual Encounters  Spiritual Needs Prayer;Emotional   Pt requested to meet w/ chap.  Chap met w/ pt in lunch room. Pt started to tell a story and then stopped himself declaring aloud, "I don't want to talk about that!"  When chap responded, "no problem. What would you like to talk about?", pt struggled to stay on topic. Pt shared about financial dreams of winning the lottery, struggles with "the flesh", various religious ideas, a gf relationship which went poorly, etc.  The pt seemed eager to just be heard.  Pt shared that he was going home tomorrow and was very happy about that. Chap mostly listened and helped pt to get back to his subject from time to time when he lost his train of thought.  Darcey Nora, Chaplain

## 2019-01-25 NOTE — Plan of Care (Signed)
Patient presents preoccupied with religion. Patient continually asking this writer what I know about God, the bible and angels.   Problem: Education: Goal: Emotional status will improve Outcome: Not Progressing Goal: Mental status will improve Outcome: Not Progressing

## 2019-01-25 NOTE — Plan of Care (Signed)
D- Patient alert and oriented. Patient presents in a pleasant mood on assessment stating that he slept good last night, but he was still tires this morning.  Patient denied any signs/symptoms of depression and anxiety. Patient also denied SI, HI, AVH, and pain at this time. Patient's goal for today is to "help someone", in which he will "pray" in order to accomplish his goal.  A- Scheduled medications administered to patient, per MD orders. Support and encouragement provided.  Routine safety checks conducted every 15 minutes.  Patient informed to notify staff with problems or concerns.  R- No adverse drug reactions noted. Patient contracts for safety at this time. Patient compliant with medications and treatment plan. Patient receptive, calm, and cooperative. Patient interacts well with others on the unit.  Patient remains safe at this time.  Problem: Education: Goal: Knowledge of [place] [facility] Education information/materials will improve Outcome: Progressing Goal: Emotional status will improve Outcome: Progressing Goal: Mental status will improve Outcome: Progressing Goal: Verbalization of understanding the information provided will improve Outcome: Progressing   Problem: Activity: Goal: Interest or engagement in activities will improve Outcome: Progressing Goal: Sleeping patterns will improve Outcome: Progressing   Problem: Coping: Goal: Coping ability will improve Outcome: Progressing Goal: Will verbalize feelings Outcome: Progressing   Problem: Health Behavior/Discharge Planning: Goal: Ability to make decisions will improve Outcome: Progressing Goal: Compliance with therapeutic regimen will improve Outcome: Progressing   Problem: Safety: Goal: Ability to disclose and discuss suicidal ideas will improve Outcome: Progressing Goal: Ability to identify and utilize support systems that promote safety will improve Outcome: Progressing

## 2019-01-25 NOTE — Progress Notes (Signed)
D: Patient has been interacting appropriately. Drawing pictures. Denies SI, HI and AVH. Mood is pleasant. Affect is appropriate to circumstance. Denies complaints. Hygiene is improved. A: Continue to monitor for safety. R: Safety maintained.

## 2019-01-25 NOTE — [facility] Group Notes (Signed)
Overcoming Obstacles  01/25/2019 1PM  Type of Therapy and Topic:  Group Therapy:  Overcoming Obstacles  Participation Level:  Active    Description of Group:    In this group patients will be encouraged to explore what they see as obstacles to their own wellness and recovery. They will be guided to discuss their thoughts, feelings, and behaviors related to these obstacles. The group will process together ways to cope with barriers, with attention given to specific choices patients can make. Each patient will be challenged to identify changes they are motivated to make in order to overcome their obstacles. This group will be process-oriented, with patients participating in exploration of their own experiences as well as giving and receiving support and challenge from other group members.   Therapeutic Goals: 1. Patient will identify personal and current obstacles as they relate to admission. 2. Patient will identify barriers that currently interfere with their wellness or overcoming obstacles.  3. Patient will identify feelings, thought process and behaviors related to these barriers. 4. Patient will identify two changes they are willing to make to overcome these obstacles:      Summary of Patient Progress Actively and appropriately engaged in the group. Patient practiced active listening when interacting with the facilitator and other group members. Patient respected boundaries during group session.    Therapeutic Modalities:   Cognitive Behavioral Therapy Solution Focused Therapy Motivational Interviewing Relapse Prevention Therapy    Sanjuana Kava, MSW, LCSW 01/25/2019 2:07 PM

## 2019-01-25 NOTE — Progress Notes (Signed)
Recreation Therapy Notes   Date: 01/25/2019  Time: 9:30 am   Location: Craft room   Behavioral response: N/A   Intervention Topic: Problem Solving  Discussion/Intervention: Patient did not attend group.   Clinical Observations/Feedback:  Patient did not attend group.   Shatoya Roets LRT/CTRS         Braidyn Scorsone 01/25/2019 11:08 AM 

## 2019-01-26 MED ORDER — CLOZAPINE 100 MG PO TABS: 350.0000 mg | ORAL_TABLET | Freq: Every day | ORAL | 1 refills | Status: DC

## 2019-01-26 MED ORDER — POLYETHYLENE GLYCOL 3350 17 G PO PACK: 1.0000 | PACK | Freq: Every morning | ORAL | 1 refills | Status: DC

## 2019-01-26 MED ORDER — LORATADINE 10 MG PO TABS: 10.0000 mg | ORAL_TABLET | Freq: Every day | ORAL | 1 refills | Status: DC

## 2019-01-26 MED ORDER — IMIPRAMINE HCL 50 MG PO TABS: 100.0000 mg | ORAL_TABLET | Freq: Every day | ORAL | 1 refills | Status: DC

## 2019-01-26 MED ORDER — FERROUS SULFATE 325 (65 FE) MG PO TABS: 325.0000 mg | ORAL_TABLET | Freq: Every day | ORAL | 1 refills | Status: DC

## 2019-01-26 MED ORDER — DESMOPRESSIN ACETATE 0.2 MG PO TABS: 0.2000 mg | ORAL_TABLET | Freq: Every day | ORAL | 1 refills | Status: DC

## 2019-01-26 MED ORDER — CLONAZEPAM 1 MG PO TABS: 1.0000 mg | ORAL_TABLET | Freq: Three times a day (TID) | ORAL | 1 refills | Status: DC

## 2019-01-26 MED ORDER — METFORMIN HCL 500 MG PO TABS: 500.0000 mg | ORAL_TABLET | Freq: Two times a day (BID) | ORAL | 1 refills | Status: DC

## 2019-01-26 MED ORDER — METOPROLOL SUCCINATE ER 100 MG PO TB24: 100.0000 mg | ORAL_TABLET | Freq: Every day | ORAL | 0 refills | Status: DC

## 2019-01-26 MED ORDER — OLANZAPINE 20 MG PO TABS: 20.0000 mg | ORAL_TABLET | Freq: Every day | ORAL | 1 refills | Status: DC

## 2019-01-26 MED ORDER — VENLAFAXINE HCL ER 75 MG PO CP24: 75.0000 mg | ORAL_CAPSULE | Freq: Every day | ORAL | 1 refills | Status: DC

## 2019-01-26 MED ORDER — LITHIUM CARBONATE ER 450 MG PO TBCR: 900.0000 mg | EXTENDED_RELEASE_TABLET | Freq: Every day | ORAL | 1 refills | Status: DC

## 2019-01-26 NOTE — Progress Notes (Signed)
Recreation Therapy Notes  Date: 01/26/2019  Time: 9:30 am   Location: Craft room   Behavioral response: N/A   Intervention Topic: Goals  Discussion/Intervention: Patient did not attend group.   Clinical Observations/Feedback:  Patient did not attend group.   Rickie Gange LRT/CTRS        Justun Anaya 01/26/2019 10:47 AM
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
## 2019-01-26 NOTE — Discharge Summary (Signed)
Physician Discharge Summary Note  Patient:  Jonathan Alexander. is an 33 y.o., male MRN:  [id number] DOB:  October 31, 1985 Patient phone:  [phone] (home)  Patient address:   [facility] [address] [place] [address],  Total Time spent with patient: 45 minutes  Date of Admission:  01/13/2019 Date of Discharge: January 26, 2019  Reason for Admission: Patient was admitted after presentation to the emergency room.  On admission his complaints were vague but his behavior and thought patterns seem to indicate a worsening of his functioning.  In the hospital it soon became clear that he was having manic-like symptoms.  Patient became agitated very intrusive euphoric hyper religious.  At no time was he violent or threatening to anyone else and at no time did he express any suicidal ideation.  At worst he was a little bit disruptive but ultimately was compliant with medication and treatment.  Patient was given ECT treatment on a index course basis for a brief time as part of the treatment to resolve his symptoms.  I also have increased his clozapine dose after getting a clozapine level which was low.  Patient's symptoms have gradually resolved.  At this point he appears to be back to what is pretty much his baseline.  He still is disorganized in his thoughts and focuses on hyper religious themes but there is no sign of current dangerousness and he is cooperative with treatment.  Patient's blood pressure has been stable.  His blood sugars have been stable.  No other acute physical problems.  He is agreeable to discharge and going back to his group home.  We have made arrangements to see him back for a maintenance ECT treatment in 2 weeks after which we will probably return to his usual maintenance schedule.  Principal Problem: Schizoaffective disorder, bipolar type [facility]) Discharge Diagnoses: Principal Problem:   Schizoaffective disorder, bipolar type ([place]) Active Problems:   Hypertension    Schizophrenia ([place])   Past Psychiatric History: Past history of schizoaffective disorder longstanding psychotic illness with multiple hospitalizations including lengthy hospitalization at [facility] that was ultimately resolved with use of clozapine and lithium and ECT  Past Medical History:  Past Medical History:  Diagnosis Date  . Schizophrenia ([place])   . Tachycardia    History reviewed. No pertinent surgical history. Family History:  Family History  Family history unknown: Yes   Family Psychiatric  History: None reported Social History:  Social History   Substance and Sexual Activity  Alcohol Use No     Social History   Substance and Sexual Activity  Drug Use No    Social History   Socioeconomic History  . Marital status: Single    Spouse name: Not on file  . Number of children: Not on file  . Years of education: Not on file  . Highest education level: Not on file  Occupational History  . Not on file  Social Needs  . Financial resource strain: Not on file  . Food insecurity    Worry: Not on file    Inability: Not on file  . Transportation needs    Medical: Not on file    Non-medical: Not on file  Tobacco Use  . Smoking status: Current Every Day Smoker    Packs/day: 1.00    Years: 4.00    Pack years: 4.00    Types: Cigarettes  . Smokeless tobacco: Never Used  Substance and Sexual Activity  . Alcohol use: No  . Drug  use: No  . Sexual activity: Never  Lifestyle  . Physical activity    Days per week: Not on file    Minutes per session: Not on file  . Stress: Not on file  Relationships  . Social Musicianconnections    Talks on phone: Not on file    Gets together: Not on file    Attends religious service: Not on file    Active member of club or organization: Not on file    Attends meetings of clubs or organizations: Not on file    Relationship status: Not on file  Other Topics Concern  . Not on file  Social History Narrative  . Not on file     Hospital Course: See my note above.  Patient was treated with his medication which was gradually increased.  Increased both clozapine and Zyprexa doses.  Lithium level was stabilized.  ECT was provided.  Patient has returned to what appears to be his safe baseline and will be discharged back to his group home.  He has act team follow-up in the community.  Physical Findings: AIMS: Facial and Oral Movements Muscles of Facial Expression: None, normal Lips and Perioral Area: Minimal Jaw: None, normal Tongue: Mild,Extremity Movements Upper (arms, wrists, hands, fingers): Mild Lower (legs, knees, ankles, toes): None, normal, Trunk Movements Neck, shoulders, hips: None, normal, Overall Severity Severity of abnormal movements (highest score from questions above): Mild Incapacitation due to abnormal movements: None, normal Patient's awareness of abnormal movements (rate only patient's report): Aware, no distress, Dental Status Current problems with teeth and/or dentures?: No Does patient usually wear dentures?: No  CIWA:  CIWA-Ar Total: 0 COWS:  COWS Total Score: 1  Musculoskeletal: Strength & Muscle Tone: within normal limits Gait & Station: normal Patient leans: N/A  Psychiatric Specialty Exam: Physical Exam  Nursing note and vitals reviewed. Constitutional: He appears well-developed and well-nourished.  HENT:  Head: Normocephalic and atraumatic.  Eyes: Pupils are equal, round, and reactive to light. Conjunctivae are normal.  Neck: Normal range of motion.  Cardiovascular: Regular rhythm and normal heart sounds.  Respiratory: Effort normal. No respiratory distress.  GI: Soft.  Musculoskeletal: Normal range of motion.  Neurological: He is alert.  Skin: Skin is warm and dry.  Psychiatric: He has a normal mood and affect. His speech is normal and behavior is normal. Judgment and thought content normal. Cognition and memory are impaired.    Review of Systems  Constitutional:  Negative.   HENT: Negative.   Eyes: Negative.   Respiratory: Negative.   Cardiovascular: Negative.   Gastrointestinal: Negative.   Musculoskeletal: Negative.   Skin: Negative.   Neurological: Negative.   Psychiatric/Behavioral: Negative.     Blood pressure 98/81, pulse (!) 103, temperature 98 F (36.7 C), temperature source Oral, resp. rate 18, height 5' 11.7" (1.821 m), weight 95.3 kg, SpO2 100 %.Body mass index is 28.72 kg/m.  General Appearance: Casual  Eye Contact:  Fair  Speech:  Clear and Coherent  Volume:  Normal  Mood:  Euthymic  Affect:  Congruent  Thought Process:  Goal Directed  Orientation:  Full (Time, Place, and Person)  Thought Content:  Rumination  Suicidal Thoughts:  No  Homicidal Thoughts:  No  Memory:  Immediate;   Fair Recent;   Fair Remote;   Fair  Judgement:  Impaired  Insight:  Shallow  Psychomotor Activity:  Normal  Concentration:  Concentration: Fair  Recall:  [employer]Fair  Fund of Knowledge:  Fair  Language:  Fair  Akathisia:  No  Handed:  Right  AIMS (if indicated):     Assets:  Desire for Improvement Housing Resilience  ADL's:  Intact  Cognition:  Impaired,  Mild  Sleep:  Number of Hours: 7     Have you used any form of tobacco in the last 30 days? (Cigarettes, Smokeless Tobacco, Cigars, and/or Pipes): Yes  Has this patient used any form of tobacco in the last 30 days? (Cigarettes, Smokeless Tobacco, Cigars, and/or Pipes) Yes, Yes, A prescription for an FDA-approved tobacco cessation medication was offered at discharge and the patient refused  Blood Alcohol level:  Lab Results  Component Value Date   [facility] 01/12/2019   ETH <10 02/09/2018    Metabolic Disorder Labs:  Lab Results  Component Value Date   HGBA1C 5.0 02/09/2018   MPG 97 02/09/2018   No results found for: PROLACTIN Lab Results  Component Value Date   CHOL 167 02/09/2018   TRIG 169 (H) 02/09/2018   HDL 38 (L) 02/09/2018   CHOLHDL 4.4 02/09/2018   VLDL 34 02/09/2018    LDLCALC 95 02/09/2018    See Psychiatric Specialty Exam and Suicide Risk Assessment completed by Attending Physician prior to discharge.  Discharge destination:  Home  Is patient on multiple antipsychotic therapies at discharge:  Yes,   Do you recommend tapering to monotherapy for antipsychotics?  No   Has Patient had three or more failed trials of antipsychotic monotherapy by history:  Yes,   Antipsychotic medications that previously failed include:   1.  Clozapine., 2.  Zyprexa. and 3.  Risperidone.  Recommended Plan for Multiple Antipsychotic Therapies: Second antipsychotic is Clozapine.  Reason for adding Clozapine Inadequate control of manic psychotic symptoms and behavior with clozapine alone or even clozapine augmented by mood stabilizer  Discharge Instructions    Diet - low sodium heart healthy   Complete by: As directed    Increase activity slowly   Complete by: As directed      Allergies as of 01/26/2019      Reactions   Divalproex Sodium    Shellfish-derived Products       Medication List    STOP taking these medications   polyethylene glycol powder 17 GM/SCOOP powder Commonly known as: GLYCOLAX/MIRALAX Replaced by: polyethylene glycol 17 g packet     TAKE these medications     Indication  clonazePAM 1 MG tablet Commonly known as: KLONOPIN Take 1 tablet (1 mg total) by mouth 3 (three) times daily. What changed: when to take this  Indication: Manic-Depression   cloZAPine 100 MG tablet Commonly known as: CLOZARIL Take 3.5 tablets (350 mg total) by mouth at bedtime. What changed:   medication strength  See the new instructions.  Indication: Schizophrenia   desmopressin 0.2 MG tablet Commonly known as: DDAVP Take 1 tablet (0.2 mg total) by mouth at bedtime.  Indication: Bedwetting, Excess Urination   EpiPen 2-Pak 0.3 mg/0.3 mL Soaj injection Generic drug: EPINEPHrine Inject 0.3 mg into the muscle as directed.  Indication: Life-Threatening  Hypersensitivity Reaction   ferrous sulfate 325 (65 FE) MG tablet Take 1 tablet (325 mg total) by mouth daily with breakfast.  Indication: Anemia From Inadequate Iron in the Body   imipramine 50 MG tablet Commonly known as: TOFRANIL Take 2 tablets (100 mg total) by mouth at bedtime. What changed: how much to take  Indication: Schizophrenia   lithium carbonate 450 MG CR tablet Commonly known as: ESKALITH Take 2 tablets (900 mg total) by mouth at bedtime.  What changed:   medication strength  how much to take  Indication: Schizoaffective Disorder   loratadine 10 MG tablet Commonly known as: CLARITIN Take 1 tablet (10 mg total) by mouth daily.  Indication: Chronic Nonallergic Rhinitis   metFORMIN 500 MG tablet Commonly known as: GLUCOPHAGE Take 1 tablet (500 mg total) by mouth 2 (two) times daily with a meal.  Indication: Antipsychotic Therapy-Induced Weight Gain, protect against metobolic SE of clozaril   metoprolol succinate 100 MG 24 hr tablet Commonly known as: TOPROL-XL Take 1 tablet (100 mg total) by mouth daily. Start taking on: January 27, 2019 What changed: how much to take  Indication: High Blood Pressure Disorder   OLANZapine 20 MG tablet Commonly known as: ZYPREXA Take 1 tablet (20 mg total) by mouth at bedtime. What changed:   medication strength  how much to take  Indication: Schizophrenia   polyethylene glycol 17 g packet Commonly known as: MIRALAX / GLYCOLAX Take 17 g by mouth every morning. Replaces: polyethylene glycol powder 17 GM/SCOOP powder  Indication: Constipation   venlafaxine XR 75 MG 24 hr capsule Commonly known as: EFFEXOR-XR Take 1 capsule (75 mg total) by mouth daily with breakfast. Start taking on: January 27, 2019 What changed: See the new instructions.  Indication: Major Depressive Disorder      Follow-up Information    Services, Psychotherapeutic. Go on 01/26/2019.   Why: Your ACT team will follow up with you on 01/26/19. Thank  you. Contact information: [address] [place] [address] [phone]           Follow-up recommendations:  Activity:  Activity as tolerated Diet:  Recommend carb controlled diet Other:  Follow-up with outpatient treatment with his regular team.  His ECT will be continued with a maintenance treatment in 2 weeks followed by maintenance schedule as indicated  Comments: Follow-up outpatient treatment as noted above  Signed: Mordecai RasmussenJohn Shedrick Sarli, MD 01/26/2019, 9:32 AM

## 2019-01-26 NOTE — Progress Notes (Signed)
Patient refused his morning Miralax.

## 2019-01-26 NOTE — Progress Notes (Signed)
  [facility] Adult Case Management Discharge Plan :  Will you be returning to the same living situation after discharge:  Yes,  pt will be returning to his group home At discharge, do you have transportation home?: Yes,  Group home will pick up between 12p-1p Do you have the ability to pay for your medications: Yes,  insurance  Release of information consent forms completed and in the chart;  Patient's signature needed at discharge.  Patient to Follow up at: Follow-up Information    [facility]. Go on 01/26/2019.   Why: Your ACT team will follow up with you on 01/26/19. Thank you. Contact information: [address] [place] [phone]           Next level of care provider has access to [place] and Suicide Prevention discussed: Yes,  [name], legal guardian  Have you used any form of tobacco in the last 30 days? (Cigarettes, Smokeless Tobacco, Cigars, and/or Pipes): Yes  Has patient been referred to the Quitline?: N/A patient is not a smoker  Patient has been referred for addiction treatment: [place], LCSW 01/26/2019, 10:24 AM

## 2019-01-26 NOTE — BHH Suicide Risk Assessment (Signed)
[facility] Discharge Suicide Risk Assessment   Principal Problem: Schizoaffective disorder, bipolar type [facility]) Discharge Diagnoses: Principal Problem:   Schizoaffective disorder, bipolar type ([place]) Active Problems:   Hypertension   Schizophrenia ([place])   Total Time spent with patient: 45 minutes  Musculoskeletal: Strength & Muscle Tone: within normal limits Gait & Station: normal Patient leans: N/A  Psychiatric Specialty Exam: Review of Systems  Constitutional: Negative.   HENT: Negative.   Eyes: Negative.   Respiratory: Negative.   Cardiovascular: Negative.   Gastrointestinal: Negative.   Musculoskeletal: Negative.   Skin: Negative.   Neurological: Negative.   Psychiatric/Behavioral: Negative.     Blood pressure 98/81, pulse (!) 103, temperature 98 F (36.7 C), temperature source Oral, resp. rate 18, height 5' 11.7" (1.821 m), weight 95.3 kg, SpO2 100 %.Body mass index is 28.72 kg/m.  General Appearance: Casual  Eye Contact::  Fair  Speech:  Normal Rate409  Volume:  Decreased  Mood:  Euthymic  Affect:  Congruent  Thought Process:  Coherent  Orientation:  Full (Time, Place, and Person)  Thought Content:  Illogical and Rumination  Suicidal Thoughts:  No  Homicidal Thoughts:  No  Memory:  Immediate;   Fair Recent;   Fair Remote;   Fair  Judgement:  Impaired  Insight:  Shallow  Psychomotor Activity:  Normal  Concentration:  Fair  Recall:  [employer] of Knowledge:Fair  Language: Fair  Akathisia:  No  Handed:  Right  AIMS (if indicated):     Assets:  Desire for Improvement  Sleep:  Number of Hours: 7  Cognition: Impaired,  Mild  ADL's:  Impaired   Mental Status Per Nursing Assessment::   On Admission:  NA  Demographic Factors:  Male  Loss Factors: NA  Historical Factors: Impulsivity  Risk Reduction Factors:   Religious beliefs about death, Living with another person, especially a relative, Positive social support and Positive therapeutic  relationship  Continued Clinical Symptoms:  Schizophrenia:   Less than 57 years old  Cognitive Features That Contribute To Risk:  None    Suicide Risk:  Minimal: No identifiable suicidal ideation.  Patients presenting with no risk factors but with morbid ruminations; may be classified as minimal risk based on the severity of the depressive symptoms  Follow-up Information    Services, Psychotherapeutic. Go on 01/26/2019.   Why: Your ACT team will follow up with you on 01/26/19. Thank you. Contact information: [address] [place] [address] [phone]           Plan Of Care/Follow-up recommendations:  Activity:  Activity as tolerated Diet:  Regular diet Other:  Follow-up with outpatient act team continue current medicine.  ECT schedule in 2 weeks.  We will notify the group home and make arrangements for that.  Alethia Berthold, MD 01/26/2019, 9:25 AM

## 2019-01-26 NOTE — Progress Notes (Signed)
Recreation Therapy Notes  INPATIENT RECREATION TR PLAN  Patient Details Name: Jonathan Buehner Jr. MRN: [id number] DOB: 05/20/1986 Today's Date: 01/26/2019  Rec Therapy Plan Is patient appropriate for Therapeutic Recreation?: Yes Treatment times per week: at least 3 Estimated Length of Stay: 5-7 days TR Treatment/Interventions: Group participation (Comment)  Discharge Criteria Pt will be discharged from therapy if:: Discharged Treatment plan/goals/alternatives discussed and agreed upon by:: Patient/family  Discharge Summary Short term goals set: Patient will identify 3 positive coping skills strategies to use post d/c within 5 recreation therapy group sessions Short term goals met: Adequate for discharge Progress toward goals comments: Groups attended Which groups?: Communication, Coping skills, Other (Comment)(Problem Solving, Values) Reason goals not met: N/A Therapeutic equipment acquired: N/A Reason patient discharged from therapy: Discharge from hospital Pt/family agrees with progress & goals achieved: Yes Date patient discharged from therapy: 01/26/19      01/26/2019, 11:55 AM  

## 2019-01-26 NOTE — Progress Notes (Signed)
Patient ID: Jonathan Alexander., male   DOB: Feb 18, 1986, 33 y.o.   MRN: [id number]   Discharge Note:  Patient denies SI/HI/AVH at this time. Discharge instructions, AVS, prescriptions, and transition record gone over with patient. Patient agrees to comply with medication management, follow-up visit, and outpatient therapy. Patient belongings returned to patient. Patient and family questions and concerns addressed and answered. Patient ambulatory off unit. Patient discharged back to the [facility] via group home staff members.

## 2019-02-03 ENCOUNTER — Telehealth: Payer: Self-pay | Admitting: *Deleted

## 2019-02-05 ENCOUNTER — Other Ambulatory Visit: Payer: Self-pay | Admitting: Psychiatry

## 2019-02-08 ENCOUNTER — Encounter
Admission: RE | Admit: 2019-02-08 | Discharge: 2019-02-08 | Disposition: A | Discharge status: Home / Self Care | Payer: Medicare Other | Source: Ambulatory Visit | Attending: Psychiatry | Admitting: Psychiatry

## 2019-02-08 ENCOUNTER — Encounter: Payer: Self-pay | Admitting: Anesthesiology

## 2019-02-08 ENCOUNTER — Other Ambulatory Visit: Payer: Self-pay

## 2019-02-08 ENCOUNTER — Telehealth: Payer: Self-pay

## 2019-02-08 ENCOUNTER — Other Ambulatory Visit
Admission: RE | Admit: 2019-02-08 | Discharge: 2019-02-08 | Disposition: A | Discharge status: Home / Self Care | Payer: Medicare Other | Source: Ambulatory Visit | Attending: Psychiatry | Admitting: Psychiatry

## 2019-02-08 DIAGNOSIS — F3110 Bipolar disorder, current episode manic without psychotic features, unspecified: Secondary | ICD-10-CM

## 2019-02-08 DIAGNOSIS — Z1159 Encounter for screening for other viral diseases: Secondary | ICD-10-CM | POA: Diagnosis not present

## 2019-02-08 DIAGNOSIS — F17210 Nicotine dependence, cigarettes, uncomplicated: Secondary | ICD-10-CM | POA: Insufficient documentation

## 2019-02-08 DIAGNOSIS — Z01812 Encounter for preprocedural laboratory examination: Secondary | ICD-10-CM | POA: Insufficient documentation

## 2019-02-08 DIAGNOSIS — R000 Tachycardia, unspecified: Secondary | ICD-10-CM | POA: Diagnosis not present

## 2019-02-08 DIAGNOSIS — F259 Schizoaffective disorder, unspecified: Secondary | ICD-10-CM | POA: Diagnosis present

## 2019-02-08 LAB — SARS CORONAVIRUS 2 BY RT PCR (HOSPITAL ORDER, PERFORMED IN ~~LOC~~ HOSPITAL LAB): SARS Coronavirus 2: NEGATIVE

## 2019-02-08 MED ORDER — SODIUM CHLORIDE 0.9 % IV SOLN
500.0000 mL | Freq: Once | INTRAVENOUS | Status: AC
  Administered 2019-02-08: 11:00:00 via INTRAVENOUS

## 2019-02-08 MED ORDER — ONDANSETRON HCL 4 MG/2ML IJ SOLN: 4.0000 mg | Freq: Once | INTRAMUSCULAR | Status: DC | PRN

## 2019-02-08 MED ORDER — SUCCINYLCHOLINE CHLORIDE 20 MG/ML IJ SOLN
INTRAMUSCULAR | Status: DC | PRN
  Administered 2019-02-08: 100 mg via INTRAVENOUS

## 2019-02-08 MED ORDER — FENTANYL CITRATE (PF) 100 MCG/2ML IJ SOLN: 25.0000 ug | INTRAMUSCULAR | Status: DC | PRN

## 2019-02-08 MED ORDER — MIDAZOLAM HCL 5 MG/5ML IJ SOLN
INTRAMUSCULAR | Status: DC | PRN
  Administered 2019-02-08: 1 mg via INTRAVENOUS

## 2019-02-08 MED ORDER — METHOHEXITAL SODIUM 100 MG/10ML IV SOSY
PREFILLED_SYRINGE | INTRAVENOUS | Status: DC | PRN
  Administered 2019-02-08: 70 mg via INTRAVENOUS

## 2019-02-08 MED ORDER — GLYCOPYRROLATE 0.2 MG/ML IJ SOLN: 0.2000 mg | Freq: Once | INTRAMUSCULAR | Status: DC

## 2019-02-08 MED ORDER — MIDAZOLAM HCL 2 MG/2ML IJ SOLN
INTRAMUSCULAR | Status: AC
  Filled 2019-02-08: qty 2

## 2019-02-08 NOTE — H&P (Signed)
Jonathan Alexander. is an 33 y.o. male.   Chief Complaint: Doing well.  Says he feels "fantastic" HPI: History of bipolar recent mania seems to be under adequate control at the moment  Past Medical History:  Diagnosis Date  . Schizophrenia ([place])   . Tachycardia     History reviewed. No pertinent surgical history.  Family History  Family history unknown: Yes   Social History:  reports that he has been smoking cigarettes. He has a 4.00 pack-year smoking history. He has never used smokeless tobacco. He reports that he does not drink alcohol or use drugs.  Allergies:  Allergies  Allergen Reactions  . Divalproex Sodium   . Shellfish-Derived Products     (Not in a hospital admission)   Results for orders placed or performed during the hospital encounter of 02/08/19 (from the past 48 hour(s))  SARS Coronavirus 2 (CEPHEID - Performed in [place] hospital lab), Hosp Order     Status: None   Collection Time: 02/08/19  8:04 AM   Specimen: Nasopharyngeal Swab  Result Value Ref Range   SARS Coronavirus 2 NEGATIVE NEGATIVE    Comment: (NOTE) If result is NEGATIVE SARS-CoV-2 target nucleic acids are NOT DETECTED. The SARS-CoV-2 RNA is generally detectable in upper and lower  respiratory specimens during the acute phase of infection. The lowest  concentration of SARS-CoV-2 viral copies this assay can detect is 250  copies / mL. A negative result does not preclude SARS-CoV-2 infection  and should not be used as the sole basis for treatment or other  patient management decisions.  A negative result may occur with  improper specimen collection / handling, submission of specimen other  than nasopharyngeal swab, presence of viral mutation(s) within the  areas targeted by this assay, and inadequate number of viral copies  (<250 copies / mL). A negative result must be combined with clinical  observations, patient history, and epidemiological information. If result is POSITIVE SARS-CoV-2  target nucleic acids are DETECTED. The SARS-CoV-2 RNA is generally detectable in upper and lower  respiratory specimens dur ing the acute phase of infection.  Positive  results are indicative of active infection with SARS-CoV-2.  Clinical  correlation with patient history and other diagnostic information is  necessary to determine patient infection status.  Positive results do  not rule out bacterial infection or co-infection with other viruses. If result is PRESUMPTIVE POSTIVE SARS-CoV-2 nucleic acids MAY BE PRESENT.   A presumptive positive result was obtained on the submitted specimen  and confirmed on repeat testing.  While 2019 novel coronavirus  (SARS-CoV-2) nucleic acids may be present in the submitted sample  additional confirmatory testing may be necessary for epidemiological  and / or clinical management purposes  to differentiate between  SARS-CoV-2 and other Sarbecovirus currently known to infect humans.  If clinically indicated additional testing with an alternate test  methodology [phone]) is advised. The SARS-CoV-2 RNA is generally  detectable in upper and lower respiratory sp ecimens during the acute  phase of infection. The expected result is Negative. Fact Sheet for Patients:  StrictlyIdeas.no Fact Sheet for Healthcare Providers: BankingDealers.co.za This test is not yet approved or cleared by the [place] FDA and has been authorized for detection and/or diagnosis of SARS-CoV-2 by FDA under an Emergency Use Authorization (EUA).  This EUA will remain in effect (meaning this test can be used) for the duration of the COVID-19 declaration under Section 564(b)(1) of the Act, 21 U.S.C. section 360bbb-3(b)(1), unless the authorization is terminated  or revoked sooner. Performed at [facility], [address] [place], [place] [address]    No results found.  Review of Systems  Constitutional: Negative.    HENT: Negative.   Eyes: Negative.   Respiratory: Negative.   Cardiovascular: Negative.   Gastrointestinal: Negative.   Musculoskeletal: Negative.   Skin: Negative.   Neurological: Negative.   Psychiatric/Behavioral: Negative.     Blood pressure 132/82, pulse 85, temperature 98.1 F (36.7 C), temperature source Oral, resp. rate 18, SpO2 98 %. Physical Exam  Nursing note and vitals reviewed. Constitutional: He appears well-developed and well-nourished.  HENT:  Head: Normocephalic and atraumatic.  Eyes: Pupils are equal, round, and reactive to light. Conjunctivae are normal.  Neck: Normal range of motion.  Cardiovascular: Regular rhythm and normal heart sounds.  Respiratory: Effort normal.  GI: Soft.  Musculoskeletal: Normal range of motion.  Neurological: He is alert.  Skin: Skin is warm and dry.  Psychiatric: He has a normal mood and affect. His behavior is normal. Judgment and thought content normal.     Assessment/Plan ECT.  We will see him back in 3 weeks.  Continue current medicine.  Mordecai RasmussenJohn , MD 02/08/2019, 10:32 AM

## 2019-02-08 NOTE — Discharge Instructions (Signed)
1)  The drugs that you have been given will stay in your system until tomorrow so for the       next 24 hours you should not:  A. Drive an automobile  B. Make any legal decisions  C. Drink any alcoholic beverages  2)  You may resume your regular meals upon return home.  3)  A responsible adult must take you home.  Someone should stay with you for a few          hours, then be available by phone for the remainder of the treatment day.  4)  You May experience any of the following symptoms:  Headache, Nausea and a dry mouth (due to the medications you were given),  temporary memory loss and some confusion, or sore muscles (a warm bath  should help this).  If you you experience any of these symptoms let us know on                your return visit.  5)  Report any of the following: any acute discomfort, severe headache, or temperature        greater than 100.5 F.   Also report any unusual redness, swelling, drainage, or pain         at your IV site.    You may report Symptoms to:  [place] [facility]          Phone: [phone], ECT Department           or Dr. Prescott Gum office [phone]  6)  Your next ECT Treatment is Monday July 27   We will call 2 days prior to your scheduled appointment for arrival times.  7)  Nothing to eat or drink after midnight the night before your procedure.  8)  Take      With a sip of water the morning of your procedure.  9)  Other Instructions: Call [phone] to cancel the morning of your procedure due         to illness or emergency.  10) We will call within 72 hours to assess how you are feeling.

## 2019-02-08 NOTE — Anesthesia Post-op Follow-up Note (Signed)
Anesthesia QCDR form completed.        

## 2019-02-08 NOTE — Anesthesia Postprocedure Evaluation (Signed)
Anesthesia Post Note  Patient: Jonathan Alexander.  Procedure(s) Performed: ECT TX  Patient location during evaluation: PACU Anesthesia Type: General Level of consciousness: awake and alert Pain management: pain level controlled Vital Signs Assessment: post-procedure vital signs reviewed and stable Respiratory status: spontaneous breathing and respiratory function stable Cardiovascular status: stable Anesthetic complications: no     Last Vitals:  Vitals:   02/08/19 1049 02/08/19 1050  BP: 126/67 126/67  Pulse: 100 (!) 114  Resp: 18 17  Temp:  37.2 C  SpO2: 100% 100%    Last Pain:  Vitals:   02/08/19 1050  TempSrc:   PainSc: Asleep                 Antonisha Waskey K

## 2019-02-08 NOTE — Procedures (Signed)
ECT SERVICES Physician's Interval Evaluation & Treatment Note  Patient Identification: Jonathan Alexander. MRN:  [id number] Date of Evaluation:  02/08/2019 TX #: 37  MADRS:   MMSE:   P.E. Findings:  No change to physical exam  Psychiatric Interval Note:  Doing well.  A little hyper but nothing out of control  Subjective:  Patient is a 33 y.o. male seen for evaluation for Electroconvulsive Therapy. No complaints.  Feels "great"  Treatment Summary:   []   Right Unilateral             [x]  Bilateral   % Energy : 1.0 ms 100%   Impedance: 1390 ohms  Seizure Energy Index: 19,196 V squared  Postictal Suppression Index: 92%  Seizure Concordance Index: 97%  Medications  Pre Shock: Robinul 0.2 mg Brevital 70 mg succinylcholine 100 mg  Post Shock: Versed 2 mg  Seizure Duration: EEG 28 seconds EMG 24 seconds   Comments: Follow-up in 3 weeks.  Lungs:  [x]   Clear to auscultation               []  Other:   Heart:    [x]   Regular rhythm             []  irregular rhythm    [x]   Previous H&P reviewed, patient examined and there are NO CHANGES                 []   Previous H&P reviewed, patient examined and there are changes noted.   Alethia Berthold, MD 7/6/202010:34 AM

## 2019-02-08 NOTE — Transfer of Care (Signed)
Immediate Anesthesia Transfer of Care Note  Patient: Jonathan Alexander.  Procedure(s) Performed: ECT TX  Patient Location: PACU  Anesthesia Type:General  Level of Consciousness: awake  Airway & Oxygen Therapy: Patient Spontanous Breathing and Patient connected to face mask oxygen  Post-op Assessment: Report given to RN and Post -op Vital signs reviewed and stable  Post vital signs: Reviewed  Last Vitals:  Vitals Value Taken Time  BP 126/67 02/08/19 1049  Temp    Pulse 100 02/08/19 1049  Resp 18 02/08/19 1049  SpO2 100 % 02/08/19 1049    Last Pain:  Vitals:   02/08/19 0915  TempSrc: Oral  PainSc: 0-No pain         Complications: No apparent anesthesia complications

## 2019-02-08 NOTE — Anesthesia Preprocedure Evaluation (Signed)
Anesthesia Evaluation  Patient identified by MRN, date of birth, ID band Patient awake    Reviewed: Allergy & Precautions, NPO status , Patient's Chart, lab work & pertinent test results, reviewed documented beta blocker date and time   History of Anesthesia Complications Negative for: history of anesthetic complications  Airway Mallampati: II  TM Distance: >3 FB     Dental  (+) Chipped   Pulmonary neg sleep apnea, neg COPD, Current Smoker, former smoker,    breath sounds clear to auscultation- rhonchi (-) wheezing      Cardiovascular Exercise Tolerance: Good hypertension, (-) CAD, (-) Past MI and (-) Cardiac Stents  Rhythm:Regular Rate:Normal - Systolic murmurs and - Diastolic murmurs    Neuro/Psych PSYCHIATRIC DISORDERS Bipolar Disorder Schizophrenia negative neurological ROS     GI/Hepatic negative GI ROS, Neg liver ROS,   Endo/Other  diabetes, Oral Hypoglycemic Agents  Renal/GU negative Renal ROS     Musculoskeletal negative musculoskeletal ROS (+)   Abdominal (+) + obese,   Peds  Hematology negative hematology ROS (+)   Anesthesia Other Findings    Reproductive/Obstetrics                             Anesthesia Physical  Anesthesia Plan  ASA: III  Anesthesia Plan: General   Post-op Pain Management:    Induction: Intravenous  PONV Risk Score and Plan: 2 and Ondansetron and TIVA  Airway Management Planned: Mask  Additional Equipment:   Intra-op Plan:   Post-operative Plan:   Informed Consent: I have reviewed the patients History and Physical, chart, labs and discussed the procedure including the risks, benefits and alternatives for the proposed anesthesia with the patient or authorized representative who has indicated his/her understanding and acceptance.     Dental Advisory Given  Plan Discussed with: CRNA and Anesthesiologist  Anesthesia Plan Comments: (Patient  consented for risks of anesthesia including but not limited to:  - adverse reactions to medications - risk of intubation if required - damage to teeth, lips or other oral mucosa - sore throat or hoarseness - Damage to heart, brain, lungs or loss of life  Patient voiced understanding.)        Anesthesia Quick Evaluation

## 2019-02-17 ENCOUNTER — Other Ambulatory Visit: Payer: Self-pay

## 2019-02-17 ENCOUNTER — Emergency Department
Admission: EM | Admit: 2019-02-17 | Discharge: 2019-02-17 | Disposition: A | Discharge status: Home / Self Care | Payer: Medicare Other | Attending: Emergency Medicine | Admitting: Emergency Medicine

## 2019-02-17 ENCOUNTER — Encounter: Payer: Self-pay | Admitting: Emergency Medicine

## 2019-02-17 DIAGNOSIS — F17210 Nicotine dependence, cigarettes, uncomplicated: Secondary | ICD-10-CM | POA: Diagnosis not present

## 2019-02-17 DIAGNOSIS — R4689 Other symptoms and signs involving appearance and behavior: Secondary | ICD-10-CM | POA: Insufficient documentation

## 2019-02-17 DIAGNOSIS — Z20828 Contact with and (suspected) exposure to other viral communicable diseases: Secondary | ICD-10-CM | POA: Diagnosis not present

## 2019-02-17 DIAGNOSIS — F250 Schizoaffective disorder, bipolar type: Secondary | ICD-10-CM | POA: Diagnosis present

## 2019-02-17 DIAGNOSIS — Z79899 Other long term (current) drug therapy: Secondary | ICD-10-CM | POA: Insufficient documentation

## 2019-02-17 DIAGNOSIS — R456 Violent behavior: Secondary | ICD-10-CM | POA: Diagnosis not present

## 2019-02-17 DIAGNOSIS — Z046 Encounter for general psychiatric examination, requested by authority: Secondary | ICD-10-CM | POA: Diagnosis present

## 2019-02-17 LAB — ACETAMINOPHEN LEVEL: Acetaminophen (Tylenol), Serum: <10 ug/mL — ABNORMAL LOW (ref 10–30)

## 2019-02-17 LAB — COMPREHENSIVE METABOLIC PANEL
ALT: 51 U/L — ABNORMAL HIGH (ref 0–44)
AST: 27 U/L (ref 15–41)
Albumin: 4 g/dL (ref 3.5–5.0)
Alkaline Phosphatase: 97 U/L (ref 38–126)
Anion gap: 6 (ref 5–15)
BUN: 8 mg/dL (ref 6–20)
CO2: 26 mmol/L (ref 22–32)
Calcium: 9 mg/dL (ref 8.9–10.3)
Chloride: 107 mmol/L (ref 98–111)
Creatinine, Ser: 0.79 mg/dL (ref 0.61–1.24)
GFR calc Af Amer: >60 mL/min (ref >60)
GFR calc non Af Amer: >60 mL/min (ref >60)
Glucose, Bld: 104 mg/dL — ABNORMAL HIGH (ref 70–99)
Potassium: 3.9 mmol/L (ref 3.5–5.1)
Sodium: 139 mmol/L (ref 135–145)
Total Bilirubin: 0.5 mg/dL (ref 0.3–1.2)
Total Protein: 6.9 g/dL (ref 6.5–8.1)

## 2019-02-17 LAB — URINE DRUG SCREEN, QUALITATIVE (ARMC ONLY)
Amphetamines, Ur Screen: NOT DETECTED
Barbiturates, Ur Screen: NOT DETECTED
Benzodiazepine, Ur Scrn: POSITIVE — AB
Cannabinoid 50 Ng, Ur ~~LOC~~: NOT DETECTED
Cocaine Metabolite,Ur ~~LOC~~: NOT DETECTED
MDMA (Ecstasy)Ur Screen: NOT DETECTED
Methadone Scn, Ur: NOT DETECTED
Opiate, Ur Screen: NOT DETECTED
Phencyclidine (PCP) Ur S: NOT DETECTED
Tricyclic, Ur Screen: POSITIVE — AB

## 2019-02-17 LAB — SALICYLATE LEVEL: Salicylate Lvl: <7 mg/dL (ref 2.8–30.0)

## 2019-02-17 LAB — CBC
HCT: 41.2 % (ref 39.0–52.0)
Hemoglobin: 13.1 g/dL (ref 13.0–17.0)
MCH: 31 pg (ref 26.0–34.0)
MCHC: 31.8 g/dL (ref 30.0–36.0)
MCV: 97.4 fL (ref 80.0–100.0)
Platelets: 271 10*3/uL (ref 150–400)
RBC: 4.23 MIL/uL (ref 4.22–5.81)
RDW: 12.8 % (ref 11.5–15.5)
WBC: 9.3 10*3/uL (ref 4.0–10.5)
nRBC: 0 % (ref 0.0–0.2)

## 2019-02-17 LAB — LITHIUM LEVEL: Lithium Lvl: 0.59 mmol/L — ABNORMAL LOW (ref 0.60–1.20)

## 2019-02-17 LAB — ETHANOL: Alcohol, Ethyl (B): <10 mg/dL (ref <10)

## 2019-02-17 MED ORDER — ONDANSETRON HCL 4 MG PO TABS
4.0000 mg | ORAL_TABLET | Freq: Once | ORAL | Status: AC
  Administered 2019-02-17: 19:00:00 4 mg via ORAL
  Filled 2019-02-17: qty 1

## 2019-02-17 NOTE — Discharge Instructions (Addendum)
Please return to group home.  Return here for any further problems continue with your other care.  You did have a very low-grade fever here.  Please return if you develop any worrisome symptoms like cough or headache or higher fever.  I have done a coronavirus test on you.  The results should be back tomorrow.  While I do not believe you have coronavirus it may be useful to quarantine yourself until the results come back.  That should be tomorrow or 2 days from now at the latest.

## 2019-02-17 NOTE — ED Notes (Signed)
Pt unable to provide urine sample at this time 

## 2019-02-17 NOTE — ED Triage Notes (Signed)
Pt belongings include black sandals, boxer shorts, gym shorts, black t-shirt, blue mask, and black watch.

## 2019-02-17 NOTE — ED Provider Notes (Signed)
[facility] Emergency Department Provider Note   ____________________________________________   First MD Initiated Contact with Patient 02/17/19 1645     (approximate)  I have reviewed the triage vital signs and the nursing notes.   HISTORY  Chief Complaint Psychiatric Evaluation    HPI Jonathan Gunner Iodice. is a 33 y.o. male patient comes from [place].  Apparently is been acting more agitated and paranoid than usual and [facility] sent him to the ER for evaluation.  He is from a group home and staff there was concern he would harm another resident.  He is cooperative in ER in the triage room but appears to be slightly on edge.  He reports he is not homicidal or suicidal.        Past Medical History:  Diagnosis Date  . Schizophrenia ([place])   . Tachycardia     Patient Active Problem List   Diagnosis Date Noted  . Schizophrenia ([place]) 01/13/2019  . Schizoaffective disorder, bipolar type ([place]) 02/09/2018  . Hypertension 02/09/2018    History reviewed. No pertinent surgical history.  Prior to Admission medications   Medication Sig Start Date End Date Taking? Authorizing Provider  clonazePAM (KLONOPIN) 1 MG tablet Take 1 tablet (1 mg total) by mouth 3 (three) times daily. 01/26/19   Clapacs, Madie Reno, MD  cloZAPine (CLOZARIL) 100 MG tablet Take 3.5 tablets (350 mg total) by mouth at bedtime. 01/26/19   Clapacs, Madie Reno, MD  desmopressin (DDAVP) 0.2 MG tablet Take 1 tablet (0.2 mg total) by mouth at bedtime. 01/26/19   Clapacs, Madie Reno, MD  EPINEPHrine (EPIPEN 2-PAK) 0.3 mg/0.3 mL IJ SOAJ injection Inject 0.3 mg into the muscle as directed.    [provider]  ferrous sulfate 325 (65 FE) MG tablet Take 1 tablet (325 mg total) by mouth daily with breakfast. 01/26/19   Clapacs, Madie Reno, MD  imipramine (TOFRANIL) 50 MG tablet Take 2 tablets (100 mg total) by mouth at bedtime. 01/26/19   Clapacs, Madie Reno, MD  lithium carbonate (ESKALITH) 450 MG CR tablet Take 2 tablets  (900 mg total) by mouth at bedtime. 01/26/19   Clapacs, Madie Reno, MD  loratadine (CLARITIN) 10 MG tablet Take 1 tablet (10 mg total) by mouth daily. 01/26/19   Clapacs, Madie Reno, MD  metFORMIN (GLUCOPHAGE) 500 MG tablet Take 1 tablet (500 mg total) by mouth 2 (two) times daily with a meal. 01/26/19   Clapacs, Madie Reno, MD  metoprolol succinate (TOPROL-XL) 100 MG 24 hr tablet Take 1 tablet (100 mg total) by mouth daily. 01/27/19   Clapacs, Madie Reno, MD  OLANZapine (ZYPREXA) 20 MG tablet Take 1 tablet (20 mg total) by mouth at bedtime. 01/26/19   Clapacs, Madie Reno, MD  polyethylene glycol (MIRALAX / GLYCOLAX) 17 g packet Take 17 g by mouth every morning. 01/26/19   Clapacs, Madie Reno, MD  venlafaxine XR (EFFEXOR-XR) 75 MG 24 hr capsule Take 1 capsule (75 mg total) by mouth daily with breakfast. 01/27/19   Clapacs, Madie Reno, MD    Allergies Divalproex sodium and Shellfish-derived products  Family History  Family history unknown: Yes    Social History Social History   Tobacco Use  . Smoking status: Current Every Day Smoker    Packs/day: 1.00    Years: 4.00    Pack years: 4.00    Types: Cigarettes  . Smokeless tobacco: Never Used  Substance Use Topics  . Alcohol use: No  . Drug use: No  Review of Systems  Constitutional: No fever/chills Eyes: No visual changes. ENT: No sore throat. Cardiovascular: Denies chest pain. Respiratory: Denies shortness of breath. Gastrointestinal: No abdominal pain.  No nausea, no vomiting.  No diarrhea.  No constipation. Genitourinary: Negative for dysuria. Musculoskeletal: Negative for back pain. Skin: Negative for rash. Neurological: Negative for headaches, focal weakness   ____________________________________________   PHYSICAL EXAM:  VITAL SIGNS: ED Triage Vitals  Enc Vitals Group     BP 02/17/19 1556 128/74     Pulse Rate 02/17/19 1556 100     Resp 02/17/19 1556 18     Temp 02/17/19 1556 100 F (37.8 C)     Temp Source 02/17/19 1556 Oral     SpO2  02/17/19 1556 100 %     Weight 02/17/19 1556 219 lb (99.3 kg)     Height 02/17/19 1556 5\' 11"  (1.803 m)     Head Circumference --      Peak Flow --      Pain Score 02/17/19 1557 0     Pain Loc --      Pain Edu? --      Excl. in GC? --     Constitutional: Alert and oriented. Well appearing and in no acute distress. Eyes: Conjunctivae are normal.  Head: Atraumatic. Nose: No congestion/rhinnorhea. Mouth/Throat: Mucous membranes are moist.  Oropharynx non-erythematous. Neck: No stridor.  Cardiovascular: Normal rate, regular rhythm. Grossly normal heart sounds.  Good peripheral circulation. Respiratory: Normal respiratory effort.  No retractions. Lungs CTAB. Gastrointestinal: Soft and nontender. No distention. No abdominal bruits. No CVA tenderness. Musculoskeletal: No lower extremity tenderness nor edema.  Neurologic:  Normal speech and language. No gross focal neurologic deficits are appreciated. No gait instability. Skin:  Skin is warm, dry and intact. No rash noted.   ____________________________________________   LABS (all labs ordered are listed, but only abnormal results are displayed)  Labs Reviewed  COMPREHENSIVE METABOLIC PANEL - Abnormal; Notable for the following components:      Result Value   Glucose, Bld 104 (*)    ALT 51 (*)    All other components within normal limits  ACETAMINOPHEN LEVEL - Abnormal; Notable for the following components:   Acetaminophen (Tylenol), Serum <10 (*)    All other components within normal limits  URINE DRUG SCREEN, QUALITATIVE ([facility] ONLY) - Abnormal; Notable for the following components:   Tricyclic, Ur Screen POSITIVE (*)    Benzodiazepine, Ur Scrn POSITIVE (*)    All other components within normal limits  LITHIUM LEVEL - Abnormal; Notable for the following components:   Lithium Lvl 0.59 (*)    All other components within normal limits  ETHANOL  SALICYLATE LEVEL  CBC   ____________________________________________  EKG    ____________________________________________  RADIOLOGY  ED MD interpretation  Official radiology report(s): No results found.  ____________________________________________   PROCEDURES  Procedure(s) performed (including Critical Care):  Procedures   ____________________________________________   INITIAL IMPRESSION / ASSESSMENT AND PLAN / ED COURSE  Psych has seen the patient and has discussed the patient with the group home has talked to the patient himself and with numerous other people.  The patient is felt by the group home to be at his baseline.  They were happy to take him back.  Patient is now happy to go back.  He does have a low-grade temperature of 100 but absolutely no symptoms at all no headache coughing sore throat bellyache dysuria no white blood count no symptoms.  I will let him  go.  He will return for any problems.             ____________________________________________   FINAL CLINICAL IMPRESSION(S) / ED DIAGNOSES  Final diagnoses:  Aggressive behavior     ED Discharge Orders    None       Note:  This document was prepared using Dragon voice recognition software and may include unintentional dictation errors.    Arnaldo NatalMalinda, Sinaya Minogue F, MD 02/17/19 [id number]
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
## 2019-02-17 NOTE — ED Notes (Signed)
Group home notified on patient being swabbed for COVID. Group home tells this RN that someone will be there to pick patient up by 8pm.
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
## 2019-02-17 NOTE — ED Notes (Signed)
Pt c/o some nausea after eating the pizza meal tray.
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
## 2019-02-17 NOTE — ED Notes (Addendum)
Group home called and notified about patient being d/c. Willing to come pick patient up.

## 2019-02-17 NOTE — ED Notes (Signed)
Pt given meal tray.

## 2019-02-17 NOTE — ED Notes (Signed)
Patient group home picking up pt. In ed lobby.

## 2019-02-19 LAB — NOVEL CORONAVIRUS, NAA (HOSP ORDER, SEND-OUT TO REF LAB; TAT 18-24 HRS): SARS-CoV-2, NAA: NOT DETECTED

## 2019-02-23 ENCOUNTER — Telehealth: Payer: Self-pay | Admitting: Emergency Medicine

## 2019-02-23 NOTE — Telephone Encounter (Signed)
Called [facility] to inform of negative covid test result.  No answer and no voicemail.

## 2019-03-02 ENCOUNTER — Other Ambulatory Visit: Payer: Self-pay | Admitting: Psychiatry

## 2019-03-02 NOTE — Progress Notes (Unsigned)
Quit saying that she had seriously

## 2019-03-03 ENCOUNTER — Encounter: Payer: Self-pay | Admitting: Certified Registered"

## 2019-03-03 ENCOUNTER — Other Ambulatory Visit
Admission: RE | Admit: 2019-03-03 | Discharge: 2019-03-03 | Disposition: A | Discharge status: Home / Self Care | Payer: Medicare Other | Source: Ambulatory Visit | Attending: Psychiatry | Admitting: Psychiatry

## 2019-03-03 ENCOUNTER — Encounter (HOSPITAL_BASED_OUTPATIENT_CLINIC_OR_DEPARTMENT_OTHER)
Admission: RE | Admit: 2019-03-03 | Discharge: 2019-03-03 | Disposition: A | Discharge status: Home / Self Care | Payer: Medicare Other | Source: Ambulatory Visit | Attending: Psychiatry | Admitting: Psychiatry

## 2019-03-03 ENCOUNTER — Other Ambulatory Visit: Payer: Self-pay

## 2019-03-03 DIAGNOSIS — Z20828 Contact with and (suspected) exposure to other viral communicable diseases: Secondary | ICD-10-CM | POA: Insufficient documentation

## 2019-03-03 DIAGNOSIS — F332 Major depressive disorder, recurrent severe without psychotic features: Secondary | ICD-10-CM

## 2019-03-03 DIAGNOSIS — F259 Schizoaffective disorder, unspecified: Secondary | ICD-10-CM | POA: Diagnosis not present

## 2019-03-03 LAB — SARS CORONAVIRUS 2 BY RT PCR (HOSPITAL ORDER, PERFORMED IN ~~LOC~~ HOSPITAL LAB): SARS Coronavirus 2: NEGATIVE

## 2019-03-03 MED ORDER — METHOHEXITAL SODIUM 100 MG/10ML IV SOSY
PREFILLED_SYRINGE | INTRAVENOUS | Status: DC | PRN
  Administered 2019-03-03: 70 mg via INTRAVENOUS

## 2019-03-03 MED ORDER — SODIUM CHLORIDE 0.9 % IV SOLN
500.0000 mL | Freq: Once | INTRAVENOUS | Status: AC
  Administered 2019-03-03: 500 mL via INTRAVENOUS

## 2019-03-03 MED ORDER — SUCCINYLCHOLINE CHLORIDE 20 MG/ML IJ SOLN
INTRAMUSCULAR | Status: DC | PRN
  Administered 2019-03-03: 100 mg via INTRAVENOUS

## 2019-03-03 MED ORDER — MIDAZOLAM HCL 2 MG/2ML IJ SOLN
INTRAMUSCULAR | Status: AC
  Filled 2019-03-03: qty 2

## 2019-03-03 MED ORDER — SUCCINYLCHOLINE CHLORIDE 20 MG/ML IJ SOLN
INTRAMUSCULAR | Status: AC
  Filled 2019-03-03: qty 2

## 2019-03-03 MED ORDER — METHOHEXITAL SODIUM 0.5 G IJ SOLR
INTRAMUSCULAR | Status: AC
  Filled 2019-03-03: qty 500

## 2019-03-03 MED ORDER — KETOROLAC TROMETHAMINE 30 MG/ML IJ SOLN: 30.0000 mg | Freq: Once | INTRAMUSCULAR | Status: DC

## 2019-03-03 MED ORDER — MIDAZOLAM HCL 2 MG/2ML IJ SOLN
2.0000 mg | Freq: Once | INTRAMUSCULAR | Status: AC
  Administered 2019-03-03: 2 mg via INTRAVENOUS

## 2019-03-03 MED ORDER — GLYCOPYRROLATE 0.2 MG/ML IJ SOLN: 0.1000 mg | Freq: Once | INTRAMUSCULAR | Status: DC

## 2019-03-03 NOTE — Transfer of Care (Signed)
Immediate Anesthesia Transfer of Care Note  Patient: Jonathan Alexander.  Procedure(s) Performed: ECT TX  Patient Location: PACU  Anesthesia Type:General  Level of Consciousness: sedated  Airway & Oxygen Therapy: Patient Spontanous Breathing and Patient connected to face mask oxygen  Post-op Assessment: Report given to RN and Post -op Vital signs reviewed and stable  Post vital signs: Reviewed  Last Vitals:  Vitals Value Taken Time  BP 126/65 03/03/19 1025  Temp    Pulse 125 03/03/19 1025  Resp 23 03/03/19 1025  SpO2 99 % 03/03/19 1025  Vitals shown include unvalidated device data.  Last Pain:  Vitals:   03/03/19 0822  TempSrc: Oral  PainSc:          Complications: No apparent anesthesia complications

## 2019-03-03 NOTE — H&P (Signed)
Jonathan Alexander. is an 33 y.o. male.   Chief Complaint: Patient has no current complaint.  Mood has been stable no return of mania or depression HPI: History of schizoaffective disorder with good response to ECT  Past Medical History:  Diagnosis Date  . Schizophrenia ([place])   . Tachycardia     History reviewed. No pertinent surgical history.  Family History  Family history unknown: Yes   Social History:  reports that he has been smoking cigarettes. He has a 4.00 pack-year smoking history. He has never used smokeless tobacco. He reports that he does not drink alcohol or use drugs.  Allergies:  Allergies  Allergen Reactions  . Divalproex Sodium   . Shellfish-Derived Products     (Not in a hospital admission)   Results for orders placed or performed during the hospital encounter of 03/03/19 (from the past 48 hour(s))  SARS Coronavirus 2 (CEPHEID - Performed in [facility] hospital lab), Hosp Order     Status: None   Collection Time: 03/03/19  7:59 AM   Specimen: Nasopharyngeal Swab  Result Value Ref Range   SARS Coronavirus 2 NEGATIVE NEGATIVE    Comment: (NOTE) If result is NEGATIVE SARS-CoV-2 target nucleic acids are NOT DETECTED. The SARS-CoV-2 RNA is generally detectable in upper and lower  respiratory specimens during the acute phase of infection. The lowest  concentration of SARS-CoV-2 viral copies this assay can detect is 250  copies / mL. A negative result does not preclude SARS-CoV-2 infection  and should not be used as the sole basis for treatment or other  patient management decisions.  A negative result may occur with  improper specimen collection / handling, submission of specimen other  than nasopharyngeal swab, presence of viral mutation(s) within the  areas targeted by this assay, and inadequate number of viral copies  (<250 copies / mL). A negative result must be combined with clinical  observations, patient history, and epidemiological information. If  result is POSITIVE SARS-CoV-2 target nucleic acids are DETECTED. The SARS-CoV-2 RNA is generally detectable in upper and lower  respiratory specimens dur ing the acute phase of infection.  Positive  results are indicative of active infection with SARS-CoV-2.  Clinical  correlation with patient history and other diagnostic information is  necessary to determine patient infection status.  Positive results do  not rule out bacterial infection or co-infection with other viruses. If result is PRESUMPTIVE POSTIVE SARS-CoV-2 nucleic acids MAY BE PRESENT.   A presumptive positive result was obtained on the submitted specimen  and confirmed on repeat testing.  While 2019 novel coronavirus  (SARS-CoV-2) nucleic acids may be present in the submitted sample  additional confirmatory testing may be necessary for epidemiological  and / or clinical management purposes  to differentiate between  SARS-CoV-2 and other Sarbecovirus currently known to infect humans.  If clinically indicated additional testing with an alternate test  methodology [id number]) is advised. The SARS-CoV-2 RNA is generally  detectable in upper and lower respiratory sp ecimens during the acute  phase of infection. The expected result is Negative. Fact Sheet for Patients:  StrictlyIdeas.no Fact Sheet for Healthcare Providers: BankingDealers.co.za This test is not yet approved or cleared by the [place] FDA and has been authorized for detection and/or diagnosis of SARS-CoV-2 by FDA under an Emergency Use Authorization (EUA).  This EUA will remain in effect (meaning this test can be used) for the duration of the COVID-19 declaration under Section 564(b)(1) of the Act, 21 U.S.C. section 360bbb-3(b)(1), unless  the authorization is terminated or revoked sooner. Performed at [facility], [address] [place], [place] [address]    No results found.  Review of Systems   Constitutional: Negative.   HENT: Negative.   Eyes: Negative.   Respiratory: Negative.   Cardiovascular: Negative.   Gastrointestinal: Negative.   Musculoskeletal: Negative.   Skin: Negative.   Neurological: Negative.   Psychiatric/Behavioral: Negative.     Blood pressure 135/82, pulse (!) 102, temperature 98.8 F (37.1 C), temperature source Oral, resp. rate 18, SpO2 98 %. Physical Exam  Nursing note and vitals reviewed. Constitutional: He appears well-developed and well-nourished.  HENT:  Head: Normocephalic and atraumatic.  Eyes: Pupils are equal, round, and reactive to light. Conjunctivae are normal.  Neck: Normal range of motion.  Cardiovascular: Regular rhythm and normal heart sounds.  Respiratory: Effort normal.  GI: Soft.  Musculoskeletal: Normal range of motion.  Neurological: He is alert.  Skin: Skin is warm and dry.  Psychiatric: He has a normal mood and affect. His behavior is normal. Judgment and thought content normal.     Assessment/Plan Patient will have ECT today and will follow-up with medication and maintenance ECT treatment in the future as currently planned  Mordecai RasmussenJohn Madisynn Plair, MD 03/03/2019, 9:58 AM

## 2019-03-03 NOTE — Anesthesia Procedure Notes (Signed)
Procedure Name: General with mask airway Date/Time: 03/03/2019 10:16 AM Performed by: Rolla Plate, CRNA Pre-anesthesia Checklist: Patient identified, Emergency Drugs available, Suction available, Patient being monitored and Timeout performed Patient Re-evaluated:Patient Re-evaluated prior to induction Oxygen Delivery Method: Circle system utilized Preoxygenation: Pre-oxygenation with 100% oxygen Induction Type: IV induction Ventilation: Mask ventilation without difficulty Airway Equipment and Method: Bite block Dental Injury: Teeth and Oropharynx as per pre-operative assessment

## 2019-03-03 NOTE — Anesthesia Postprocedure Evaluation (Signed)
Anesthesia Post Note  Patient: Jonathan Alexander.  Procedure(s) Performed: ECT TX  Patient location during evaluation: PACU Anesthesia Type: General Level of consciousness: awake and alert Pain management: pain level controlled Vital Signs Assessment: post-procedure vital signs reviewed and stable Respiratory status: spontaneous breathing, nonlabored ventilation, respiratory function stable and patient connected to nasal cannula oxygen Cardiovascular status: blood pressure returned to baseline and stable Postop Assessment: no apparent nausea or vomiting Anesthetic complications: no     Last Vitals:  Vitals:   03/03/19 1101 03/03/19 1130  BP: 93/76 115/80  Pulse: (!) 120 (!) 112  Resp: 18 16  Temp: 36.8 C   SpO2: 99%     Last Pain:  Vitals:   03/03/19 1130  TempSrc:   PainSc: 0-No pain                 Martha Clan

## 2019-03-03 NOTE — Anesthesia Preprocedure Evaluation (Signed)
Anesthesia Evaluation  Patient identified by MRN, date of birth, ID band Patient awake    Reviewed: Allergy & Precautions, NPO status , Patient's Chart, lab work & pertinent test results, reviewed documented beta blocker date and time   History of Anesthesia Complications Negative for: history of anesthetic complications  Airway Mallampati: II  TM Distance: >3 FB     Dental  (+) Chipped   Pulmonary neg sleep apnea, neg COPD, Current Smoker, former smoker,    breath sounds clear to auscultation- rhonchi (-) wheezing      Cardiovascular Exercise Tolerance: Good hypertension, (-) CAD, (-) Past MI and (-) Cardiac Stents  Rhythm:Regular Rate:Normal - Systolic murmurs and - Diastolic murmurs    Neuro/Psych PSYCHIATRIC DISORDERS Bipolar Disorder Schizophrenia negative neurological ROS     GI/Hepatic negative GI ROS, Neg liver ROS,   Endo/Other  diabetes, Oral Hypoglycemic Agents  Renal/GU negative Renal ROS     Musculoskeletal negative musculoskeletal ROS (+)   Abdominal (+) + obese,   Peds  Hematology negative hematology ROS (+)   Anesthesia Other Findings  Past Medical History: No date: Schizophrenia ([facility]) No date: Tachycardia   Reproductive/Obstetrics                             Anesthesia Physical  Anesthesia Plan  ASA: III  Anesthesia Plan: General   Post-op Pain Management:    Induction: Intravenous  PONV Risk Score and Plan: 2 and Ondansetron and TIVA  Airway Management Planned: Mask  Additional Equipment:   Intra-op Plan:   Post-operative Plan:   Informed Consent: I have reviewed the patients History and Physical, chart, labs and discussed the procedure including the risks, benefits and alternatives for the proposed anesthesia with the patient or authorized representative who has indicated his/her understanding and acceptance.     Dental Advisory Given  Plan  Discussed with: CRNA and Anesthesiologist  Anesthesia Plan Comments: (Patient consented for risks of anesthesia including but not limited to:  - adverse reactions to medications - risk of intubation if required - damage to teeth, lips or other oral mucosa - sore throat or hoarseness - Damage to heart, brain, lungs or loss of life  Patient voiced understanding.)        Anesthesia Quick Evaluation  

## 2019-03-03 NOTE — Discharge Instructions (Signed)
1)  The drugs that you have been given will stay in your system until tomorrow so for the       next 24 hours you should not:  A. Drive an automobile  B. Make any legal decisions  C. Drink any alcoholic beverages  2)  You may resume your regular meals upon return home.  3)  A responsible adult must take you home.  Someone should stay with you for a few          hours, then be available by phone for the remainder of the treatment day.  4)  You May experience any of the following symptoms:  Headache, Nausea and a dry mouth (due to the medications you were given),  temporary memory loss and some confusion, or sore muscles (a warm bath  should help this).  If you you experience any of these symptoms let us know on                your return visit.  5)  Report any of the following: any acute discomfort, severe headache, or temperature        greater than 100.5 F.   Also report any unusual redness, swelling, drainage, or pain         at your IV site.    You may report Symptoms to:  [place] [facility]          Phone: [phone], ECT Department           or Dr. Prescott Gum office [phone]  6)  Your next ECT Treatment is Wednesday August 19   We will call 2 days prior to your scheduled appointment for arrival times.  7)  Nothing to eat or drink after midnight the night before your procedure.  8)  Take morning meds with a sip of water the morning of your procedure.  9)  Other Instructions: Call [phone] to cancel the morning of your procedure due         to illness or emergency.  10) We will call within 72 hours to assess how you are feeling.

## 2019-03-03 NOTE — Anesthesia Post-op Follow-up Note (Signed)
Anesthesia QCDR form completed.        

## 2019-03-03 NOTE — Procedures (Signed)
ECT SERVICES Physician's Interval Evaluation & Treatment Note  Patient Identification: Jonathan Alexander. MRN:  [id number] Date of Evaluation:  03/03/2019 TX #: [id number]  MADRS:   MMSE:   P.E. Findings:  No change to physical exam  Psychiatric Interval Note:  Mood is stable no mania no depression  Subjective:  Patient is a 33 y.o. male seen for evaluation for Electroconvulsive Therapy. No specific complaints no psychosis  Treatment Summary:   []   Right Unilateral             [x]  Bilateral   % Energy : 1.0 ms 100%   Impedance: 900 ohms  Seizure Energy Index: 17,959 V squared  Postictal Suppression Index: 94%  Seizure Concordance Index: 98%  Medications  Pre Shock: Robinul 0.2 mg Brevital 70 mg succinylcholine 100 mg  Post Shock: Versed 2 mg  Seizure Duration: 32 seconds EMG 33 seconds EEG   Comments: Follow-up in 3 weeks  Lungs:  [x]   Clear to auscultation               []  Other:   Heart:    [x]   Regular rhythm             []  irregular rhythm    [x]   Previous H&P reviewed, patient examined and there are NO CHANGES                 []   Previous H&P reviewed, patient examined and there are changes noted.   Alethia Berthold, MD 7/29/202010:09 AM

## 2019-03-16 ENCOUNTER — Other Ambulatory Visit: Payer: Self-pay | Admitting: Psychiatry

## 2019-03-23 ENCOUNTER — Other Ambulatory Visit: Payer: Self-pay | Admitting: Psychiatry

## 2019-03-29 ENCOUNTER — Telehealth: Payer: Self-pay

## 2019-03-30 ENCOUNTER — Other Ambulatory Visit: Payer: Self-pay | Admitting: Psychiatry

## 2019-03-31 ENCOUNTER — Encounter
Admission: RE | Admit: 2019-03-31 | Discharge: 2019-03-31 | Disposition: A | Discharge status: Home / Self Care | Payer: Medicare Other | Source: Ambulatory Visit | Attending: Psychiatry | Admitting: Psychiatry

## 2019-03-31 ENCOUNTER — Encounter: Payer: Self-pay | Admitting: Anesthesiology

## 2019-03-31 ENCOUNTER — Other Ambulatory Visit: Payer: Self-pay

## 2019-03-31 ENCOUNTER — Other Ambulatory Visit
Admission: RE | Admit: 2019-03-31 | Discharge: 2019-03-31 | Disposition: A | Discharge status: Home / Self Care | Payer: Medicare Other | Source: Ambulatory Visit | Attending: Psychiatry | Admitting: Psychiatry

## 2019-03-31 DIAGNOSIS — F250 Schizoaffective disorder, bipolar type: Secondary | ICD-10-CM | POA: Diagnosis not present

## 2019-03-31 DIAGNOSIS — R000 Tachycardia, unspecified: Secondary | ICD-10-CM | POA: Diagnosis not present

## 2019-03-31 DIAGNOSIS — Z20828 Contact with and (suspected) exposure to other viral communicable diseases: Secondary | ICD-10-CM | POA: Insufficient documentation

## 2019-03-31 DIAGNOSIS — Z01812 Encounter for preprocedural laboratory examination: Secondary | ICD-10-CM | POA: Diagnosis not present

## 2019-03-31 DIAGNOSIS — F259 Schizoaffective disorder, unspecified: Secondary | ICD-10-CM | POA: Insufficient documentation

## 2019-03-31 DIAGNOSIS — F17210 Nicotine dependence, cigarettes, uncomplicated: Secondary | ICD-10-CM | POA: Insufficient documentation

## 2019-03-31 LAB — SARS CORONAVIRUS 2 BY RT PCR (HOSPITAL ORDER, PERFORMED IN ~~LOC~~ HOSPITAL LAB): SARS Coronavirus 2: NEGATIVE

## 2019-03-31 MED ORDER — SUCCINYLCHOLINE CHLORIDE 200 MG/10ML IV SOSY
PREFILLED_SYRINGE | INTRAVENOUS | Status: DC | PRN
  Administered 2019-03-31: 100 mg via INTRAVENOUS

## 2019-03-31 MED ORDER — SODIUM CHLORIDE 0.9 % IV SOLN
INTRAVENOUS | Status: DC | PRN
  Administered 2019-03-31: 10:00:00 via INTRAVENOUS

## 2019-03-31 MED ORDER — GLYCOPYRROLATE 0.2 MG/ML IJ SOLN: 0.1000 mg | Freq: Once | INTRAMUSCULAR | Status: DC

## 2019-03-31 MED ORDER — MIDAZOLAM HCL 2 MG/2ML IJ SOLN: 2.0000 mg | Freq: Once | INTRAMUSCULAR | Status: DC

## 2019-03-31 MED ORDER — METHOHEXITAL SODIUM 100 MG/10ML IV SOSY
PREFILLED_SYRINGE | INTRAVENOUS | Status: DC | PRN
  Administered 2019-03-31: 70 mg via INTRAVENOUS

## 2019-03-31 MED ORDER — SUCCINYLCHOLINE CHLORIDE 20 MG/ML IJ SOLN
INTRAMUSCULAR | Status: AC
  Filled 2019-03-31: qty 1

## 2019-03-31 MED ORDER — MIDAZOLAM HCL 2 MG/2ML IJ SOLN
INTRAMUSCULAR | Status: DC | PRN
  Administered 2019-03-31: 1 mg via INTRAVENOUS

## 2019-03-31 MED ORDER — SODIUM CHLORIDE 0.9 % IV SOLN
500.0000 mL | Freq: Once | INTRAVENOUS | Status: AC
  Administered 2019-03-31: 500 mL via INTRAVENOUS

## 2019-03-31 MED ORDER — MIDAZOLAM HCL 2 MG/2ML IJ SOLN
INTRAMUSCULAR | Status: AC
  Filled 2019-03-31: qty 2

## 2019-03-31 NOTE — Discharge Instructions (Signed)
1)  The drugs that you have been given will stay in your system until tomorrow so for the       next 24 hours you should not:  A. Drive an automobile  B. Make any legal decisions  C. Drink any alcoholic beverages  2)  You may resume your regular meals upon return home.  3)  A responsible adult must take you home.  Someone should stay with you for a few          hours, then be available by phone for the remainder of the treatment day.  4)  You May experience any of the following symptoms:  Headache, Nausea and a dry mouth (due to the medications you were given),  temporary memory loss and some confusion, or sore muscles (a warm bath  should help this).  If you you experience any of these symptoms let us know on                your return visit.  5)  Report any of the following: any acute discomfort, severe headache, or temperature        greater than 100.5 F.   Also report any unusual redness, swelling, drainage, or pain         at your IV site.    You may report Symptoms to:  [place] at [facility]          Phone: [phone], ECT Department           or Dr. Prescott Gum office [phone]  6)  Your next ECT Treatment is September 21 at 8:00 to receive a COVID test  We will call 2 days prior to your scheduled appointment for arrival times.  7)  Nothing to eat or drink after midnight the night before your procedure.  8)  Take morning meds with a sip of water the morning of your procedure.  9)  Other Instructions: Call [phone] to cancel the morning of your procedure due         to illness or emergency.  10) We will call within 72 hours to assess how you are feeling.

## 2019-03-31 NOTE — Progress Notes (Signed)
Informed Dr. Randa Lynn HR 130.  Provider stated pt OK.

## 2019-03-31 NOTE — Anesthesia Procedure Notes (Signed)
Date/Time: 03/31/2019 11:52 AM Performed by: Dionne Bucy, CRNA Pre-anesthesia Checklist: Patient identified, Emergency Drugs available, Suction available and Patient being monitored Patient Re-evaluated:Patient Re-evaluated prior to induction Oxygen Delivery Method: Circle system utilized Preoxygenation: Pre-oxygenation with 100% oxygen Induction Type: IV induction Ventilation: Mask ventilation without difficulty and Mask ventilation throughout procedure Airway Equipment and Method: Bite block Placement Confirmation: positive ETCO2 Dental Injury: Teeth and Oropharynx as per pre-operative assessment

## 2019-03-31 NOTE — Anesthesia Postprocedure Evaluation (Signed)
Anesthesia Post Note  Patient: Jonathan Alexander.  Procedure(s) Performed: ECT TX  Patient location during evaluation: PACU Anesthesia Type: General Level of consciousness: awake and alert Pain management: pain level controlled Vital Signs Assessment: post-procedure vital signs reviewed and stable Respiratory status: spontaneous breathing, nonlabored ventilation and respiratory function stable Cardiovascular status: blood pressure returned to baseline and stable Postop Assessment: no signs of nausea or vomiting Anesthetic complications: no     Last Vitals:  Vitals:   03/31/19 1223 03/31/19 1236  BP: 115/75 113/72  Pulse: (!) 129 (!) 125  Resp: 18 18  Temp:  36.9 C  SpO2: 99%     Last Pain:  Vitals:   03/31/19 1236  TempSrc: Oral  PainSc: 0-No pain                 Cleto Claggett

## 2019-03-31 NOTE — Procedures (Signed)
ECT SERVICES Physician's Interval Evaluation & Treatment Note  Patient Identification: Jonathan Alexander. MRN:  [id number] Date of Evaluation:  03/31/2019 TX #: 39  MADRS:   MMSE:   P.E. Findings:  No change to physical exam.  Appears to be in good health.  Psychiatric Interval Note:  A little blunted but still able to make jokes.  Definitely not manic.  Subjective:  Patient is a 33 y.o. male seen for evaluation for Electroconvulsive Therapy. No complaints not suicidal denies hallucinations  Treatment Summary:   []   Right Unilateral             [x]  Bilateral   % Energy : 1.0 ms 100%   Impedance: 970 ohms  Seizure Energy Index: 12,496 V squared  Postictal Suppression Index: 88%  Seizure Concordance Index: 98%  Medications  Pre Shock: Robinul 0.2 mg Brevital 70 mg succinylcholine 100 mg  Post Shock: Versed 2 mg  Seizure Duration: 18 seconds EMG 28 seconds EEG   Comments: Next treatment in 3 weeks  Lungs:  [x]   Clear to auscultation               []  Other:   Heart:    [x]   Regular rhythm             []  irregular rhythm    [x]   Previous H&P reviewed, patient examined and there are NO CHANGES                 []   Previous H&P reviewed, patient examined and there are changes noted.   Alethia Berthold, MD 8/26/20205:52 PM

## 2019-03-31 NOTE — Transfer of Care (Signed)
Immediate Anesthesia Transfer of Care Note  Patient: Jonathan Alexander.  Procedure(s) Performed: ECT TX  Patient Location: PACU  Anesthesia Type:General  Level of Consciousness: sedated  Airway & Oxygen Therapy: Patient Spontanous Breathing and Patient connected to face mask oxygen  Post-op Assessment: Report given to RN and Post -op Vital signs reviewed and stable  Post vital signs: Reviewed and stable  Last Vitals:  Vitals Value Taken Time  BP 120/62 03/31/19 1201  Temp 36.9 C 03/31/19 1200  Pulse 137 03/31/19 1204  Resp 13 03/31/19 1204  SpO2 98 % 03/31/19 1204  Vitals shown include unvalidated device data.  Last Pain:  Vitals:   03/31/19 1200  TempSrc:   PainSc: 0-No pain         Complications: No apparent anesthesia complications

## 2019-03-31 NOTE — H&P (Signed)
Jonathan Alexander. is an 33 y.o. male.   Chief Complaint: No specific complaint today. HPI: History of schizoaffective disorder with good maintenance of stability with ECT as part of the treatment.  Doing well since last hospitalization.  Past Medical History:  Diagnosis Date  . Schizophrenia ([place])   . Tachycardia     History reviewed. No pertinent surgical history.  Family History  Family history unknown: Yes   Social History:  reports that he has been smoking cigarettes. He has a 4.00 pack-year smoking history. He has never used smokeless tobacco. He reports that he does not drink alcohol or use drugs.  Allergies:  Allergies  Allergen Reactions  . Divalproex Sodium   . Shellfish-Derived Products     (Not in a hospital admission)   No results found for this or any previous visit (from the past 48 hour(s)). No results found.  Review of Systems  Constitutional: Negative.   HENT: Negative.   Eyes: Negative.   Respiratory: Negative.   Cardiovascular: Negative.   Gastrointestinal: Negative.   Musculoskeletal: Negative.   Skin: Negative.   Neurological: Negative.   Psychiatric/Behavioral: Negative.     Blood pressure 127/82, pulse 94, temperature 98.7 F (37.1 C), temperature source Oral, resp. rate 16, SpO2 99 %. Physical Exam  Nursing note and vitals reviewed. Constitutional: He appears well-developed and well-nourished.  HENT:  Head: Normocephalic and atraumatic.  Eyes: Pupils are equal, round, and reactive to light. Conjunctivae are normal.  Neck: Normal range of motion.  Cardiovascular: Regular rhythm and normal heart sounds.  Respiratory: Effort normal.  GI: Soft.  Musculoskeletal: Normal range of motion.  Neurological: He is alert.  Skin: Skin is warm and dry.  Psychiatric: He has a normal mood and affect. His behavior is normal. Judgment and thought content normal.     Assessment/Plan ECT and continue on regular maintenance schedule.  Alethia Berthold,  MD 03/31/2019, 9:25 AM

## 2019-03-31 NOTE — Anesthesia Preprocedure Evaluation (Signed)
Anesthesia Evaluation  Patient identified by MRN, date of birth, ID band Patient awake    Reviewed: Allergy & Precautions, NPO status , Patient's Chart, lab work & pertinent test results, reviewed documented beta blocker date and time   History of Anesthesia Complications Negative for: history of anesthetic complications  Airway Mallampati: II  TM Distance: >3 FB     Dental  (+) Chipped   Pulmonary neg sleep apnea, neg COPD, Current Smoker, former smoker,    breath sounds clear to auscultation- rhonchi (-) wheezing      Cardiovascular Exercise Tolerance: Good hypertension, (-) CAD, (-) Past MI and (-) Cardiac Stents  Rhythm:Regular Rate:Normal - Systolic murmurs and - Diastolic murmurs    Neuro/Psych PSYCHIATRIC DISORDERS Bipolar Disorder Schizophrenia negative neurological ROS     GI/Hepatic negative GI ROS, Neg liver ROS,   Endo/Other  diabetes, Oral Hypoglycemic Agents  Renal/GU negative Renal ROS     Musculoskeletal negative musculoskeletal ROS (+)   Abdominal (+) + obese,   Peds  Hematology negative hematology ROS (+)   Anesthesia Other Findings Past Medical History: No date: Schizophrenia ([place]) No date: Tachycardia   Reproductive/Obstetrics                             Anesthesia Physical  Anesthesia Plan  ASA: III  Anesthesia Plan: General   Post-op Pain Management:    Induction: Intravenous  PONV Risk Score and Plan: 2 and Ondansetron  Airway Management Planned: Mask  Additional Equipment:   Intra-op Plan:   Post-operative Plan:   Informed Consent: I have reviewed the patients History and Physical, chart, labs and discussed the procedure including the risks, benefits and alternatives for the proposed anesthesia with the patient or authorized representative who has indicated his/her understanding and acceptance.       Plan Discussed with: CRNA and  Anesthesiologist  Anesthesia Plan Comments: (Ate apple pie at 0330, plan to start at 1130)        Anesthesia Quick Evaluation

## 2019-04-26 ENCOUNTER — Other Ambulatory Visit: Payer: Self-pay

## 2019-04-26 ENCOUNTER — Encounter: Payer: Self-pay | Admitting: Anesthesiology

## 2019-04-26 ENCOUNTER — Encounter
Admission: RE | Admit: 2019-04-26 | Discharge: 2019-04-26 | Disposition: A | Discharge status: Home / Self Care | Payer: Medicare Other | Source: Ambulatory Visit | Attending: Psychiatry | Admitting: Psychiatry

## 2019-04-26 ENCOUNTER — Other Ambulatory Visit
Admission: RE | Admit: 2019-04-26 | Discharge: 2019-04-26 | Disposition: A | Discharge status: Home / Self Care | Payer: Medicare Other | Source: Ambulatory Visit | Attending: Psychiatry | Admitting: Psychiatry

## 2019-04-26 DIAGNOSIS — E669 Obesity, unspecified: Secondary | ICD-10-CM | POA: Diagnosis not present

## 2019-04-26 DIAGNOSIS — Z7984 Long term (current) use of oral hypoglycemic drugs: Secondary | ICD-10-CM | POA: Insufficient documentation

## 2019-04-26 DIAGNOSIS — Z20828 Contact with and (suspected) exposure to other viral communicable diseases: Secondary | ICD-10-CM | POA: Insufficient documentation

## 2019-04-26 DIAGNOSIS — F209 Schizophrenia, unspecified: Secondary | ICD-10-CM | POA: Diagnosis not present

## 2019-04-26 DIAGNOSIS — E119 Type 2 diabetes mellitus without complications: Secondary | ICD-10-CM | POA: Diagnosis not present

## 2019-04-26 DIAGNOSIS — Z6831 Body mass index (BMI) 31.0-31.9, adult: Secondary | ICD-10-CM | POA: Diagnosis not present

## 2019-04-26 DIAGNOSIS — F319 Bipolar disorder, unspecified: Secondary | ICD-10-CM | POA: Diagnosis present

## 2019-04-26 DIAGNOSIS — Z79899 Other long term (current) drug therapy: Secondary | ICD-10-CM | POA: Diagnosis not present

## 2019-04-26 DIAGNOSIS — F17210 Nicotine dependence, cigarettes, uncomplicated: Secondary | ICD-10-CM | POA: Insufficient documentation

## 2019-04-26 DIAGNOSIS — Z888 Allergy status to other drugs, medicaments and biological substances status: Secondary | ICD-10-CM | POA: Insufficient documentation

## 2019-04-26 DIAGNOSIS — I10 Essential (primary) hypertension: Secondary | ICD-10-CM | POA: Insufficient documentation

## 2019-04-26 DIAGNOSIS — F332 Major depressive disorder, recurrent severe without psychotic features: Secondary | ICD-10-CM

## 2019-04-26 DIAGNOSIS — Z01812 Encounter for preprocedural laboratory examination: Secondary | ICD-10-CM | POA: Diagnosis present

## 2019-04-26 LAB — SARS CORONAVIRUS 2 (TAT 6-24 HRS): SARS Coronavirus 2: NEGATIVE

## 2019-04-26 MED ORDER — SODIUM CHLORIDE 0.9 % IV SOLN
500.0000 mL | Freq: Once | INTRAVENOUS | Status: AC
  Administered 2019-04-26: 12:00:00 via INTRAVENOUS

## 2019-04-26 MED ORDER — SUCCINYLCHOLINE CHLORIDE 200 MG/10ML IV SOSY
PREFILLED_SYRINGE | INTRAVENOUS | Status: DC | PRN
  Administered 2019-04-26: 100 mg via INTRAVENOUS

## 2019-04-26 MED ORDER — METHOHEXITAL SODIUM 100 MG/10ML IV SOSY
PREFILLED_SYRINGE | INTRAVENOUS | Status: DC | PRN
  Administered 2019-04-26: 70 mg via INTRAVENOUS

## 2019-04-26 MED ORDER — GLYCOPYRROLATE 0.2 MG/ML IJ SOLN: 0.2000 mg | Freq: Once | INTRAMUSCULAR | Status: DC

## 2019-04-26 MED ORDER — SUCCINYLCHOLINE CHLORIDE 20 MG/ML IJ SOLN
INTRAMUSCULAR | Status: AC
  Filled 2019-04-26: qty 1

## 2019-04-26 MED ORDER — MIDAZOLAM HCL 2 MG/2ML IJ SOLN
2.0000 mg | Freq: Once | INTRAMUSCULAR | Status: AC
  Administered 2019-04-26: 1 mg via INTRAVENOUS

## 2019-04-26 MED ORDER — MIDAZOLAM HCL 2 MG/2ML IJ SOLN
INTRAMUSCULAR | Status: AC
  Filled 2019-04-26: qty 2

## 2019-04-26 NOTE — Anesthesia Preprocedure Evaluation (Signed)
Anesthesia Evaluation  Patient identified by MRN, date of birth, ID band Patient awake    Reviewed: Allergy & Precautions, NPO status , Patient's Chart, lab work & pertinent test results, reviewed documented beta blocker date and time   History of Anesthesia Complications Negative for: history of anesthetic complications  Airway Mallampati: II  TM Distance: >3 FB     Dental  (+) Chipped   Pulmonary neg sleep apnea, neg COPD, Current Smoker, former smoker,    breath sounds clear to auscultation- rhonchi (-) wheezing      Cardiovascular Exercise Tolerance: Good hypertension, (-) CAD, (-) Past MI and (-) Cardiac Stents  Rhythm:Regular Rate:Normal - Systolic murmurs and - Diastolic murmurs    Neuro/Psych PSYCHIATRIC DISORDERS Bipolar Disorder Schizophrenia negative neurological ROS     GI/Hepatic negative GI ROS, Neg liver ROS,   Endo/Other  diabetes, Oral Hypoglycemic Agents  Renal/GU negative Renal ROS     Musculoskeletal negative musculoskeletal ROS (+)   Abdominal (+) + obese,   Peds  Hematology negative hematology ROS (+)   Anesthesia Other Findings Past Medical History: No date: Schizophrenia ([place]) No date: Tachycardia   Reproductive/Obstetrics                             Anesthesia Physical  Anesthesia Plan  ASA: III  Anesthesia Plan: General   Post-op Pain Management:    Induction: Intravenous  PONV Risk Score and Plan: 2 and Ondansetron  Airway Management Planned: Mask  Additional Equipment:   Intra-op Plan:   Post-operative Plan:   Informed Consent: I have reviewed the patients History and Physical, chart, labs and discussed the procedure including the risks, benefits and alternatives for the proposed anesthesia with the patient or authorized representative who has indicated his/her understanding and acceptance.       Plan Discussed with: CRNA and  Anesthesiologist  Anesthesia Plan Comments: (Ate apple pie at 0330, plan to start at 1130)        Anesthesia Quick Evaluation

## 2019-04-26 NOTE — H&P (Signed)
Jonathan Alexander. is an 33 y.o. male.   Chief Complaint: none. Feeling good HPI: bipolar disorder  Past Medical History:  Diagnosis Date  . Schizophrenia ([place])   . Tachycardia     History reviewed. No pertinent surgical history.  Family History  Family history unknown: Yes   Social History:  reports that he has been smoking cigarettes. He has a 4.00 pack-year smoking history. He has never used smokeless tobacco. He reports that he does not drink alcohol or use drugs.  Allergies:  Allergies  Allergen Reactions  . Divalproex Sodium   . Shellfish-Derived Products     (Not in a hospital admission)   No results found for this or any previous visit (from the past 48 hour(s)). No results found.  Review of Systems  Constitutional: Negative.   HENT: Negative.   Eyes: Negative.   Respiratory: Negative.   Cardiovascular: Negative.   Gastrointestinal: Negative.   Musculoskeletal: Negative.   Skin: Negative.   Neurological: Negative.   Psychiatric/Behavioral: Negative.     Blood pressure (!) 147/95, pulse 100, temperature 98.6 F (37 C), temperature source Oral, resp. rate 16, height 6\' 2"  (1.88 m), weight 111.6 kg, SpO2 100 %. Physical Exam  Nursing note and vitals reviewed. Constitutional: He appears well-developed and well-nourished.  HENT:  Head: Normocephalic and atraumatic.  Eyes: Pupils are equal, round, and reactive to light. Conjunctivae are normal.  Neck: Normal range of motion.  Cardiovascular: Regular rhythm and normal heart sounds.  Respiratory: Effort normal.  GI: Soft.  Musculoskeletal: Normal range of motion.  Neurological: He is alert.  Skin: Skin is warm and dry.  Psychiatric: He has a normal mood and affect. His behavior is normal. Judgment and thought content normal.     Assessment/Plan Continue maintainence  Alethia Berthold, MD 04/26/2019, 11:33 AM

## 2019-04-26 NOTE — Procedures (Signed)
ECT SERVICES Physician's Interval Evaluation & Treatment Note  Patient Identification: Campbell Mike Gip. MRN:  [id number] Date of Evaluation:  04/26/2019 TX #: 40  MADRS:   MMSE:   P.E. Findings:  No change to physical exam  Psychiatric Interval Note:  A little bit hyper but not to the degree that seems to be causing a problem  Subjective:  Patient is a 33 y.o. male seen for evaluation for Electroconvulsive Therapy. Patient describes himself as feeling very good.  Treatment Summary:   []   Right Unilateral             [x]  Bilateral   % Energy : 1.0 ms 100%   Impedance: 1550 ohms  Seizure Energy Index: 8761 V squared  Postictal Suppression Index: 95%  Seizure Concordance Index: 95%  Medications  Pre Shock: Robinul 0.2 mg Brevital 70 mg succinylcholine 100 mg  Post Shock: Versed 2 mg  Seizure Duration: 24 seconds by EMG 25 by EEG.  Computer read the seizure longer but I think it really ended back at 25   Comments: Follow-up in 3 weeks  Lungs:  [x]   Clear to auscultation               []  Other:   Heart:    [x]   Regular rhythm             []  irregular rhythm    [x]   Previous H&P reviewed, patient examined and there are NO CHANGES                 []   Previous H&P reviewed, patient examined and there are changes noted.   Alethia Berthold, MD 9/21/202011:35 AM

## 2019-04-26 NOTE — Discharge Instructions (Addendum)
1)  The drugs that you have been given will stay in your system until tomorrow so for the       next 24 hours you should not:  A. Drive an automobile  B. Make any legal decisions  C. Drink any alcoholic beverages  2)  You may resume your regular meals upon return home.  3)  A responsible adult must take you home.  Someone should stay with you for a few          hours, then be available by phone for the remainder of the treatment day.  4)  You May experience any of the following symptoms:  Headache, Nausea and a dry mouth (due to the medications you were given),  temporary memory loss and some confusion, or sore muscles (a warm bath  should help this).  If you you experience any of these symptoms let us know on                your return visit.  5)  Report any of the following: any acute discomfort, severe headache, or temperature        greater than 100.5 F.   Also report any unusual redness, swelling, drainage, or pain         at your IV site.    You may report Symptoms to:  [place] at [facility]          Phone: [phone], ECT Department           or Dr. Prescott Gum office [phone]  6)  Your next ECT Treatment is Wednesday, October 12 at 8:00 to receive a COVID test  We will call 2 days prior to your scheduled appointment for arrival times.  7)  Nothing to eat or drink after midnight the night before your procedure.  8)  Take     With a sip of water the morning of your procedure.  9)  Other Instructions: Call [phone] to cancel the morning of your procedure due         to illness or emergency.  10) We will call within 72 hours to assess how you are feeling.

## 2019-04-26 NOTE — Transfer of Care (Signed)
Immediate Anesthesia Transfer of Care Note  Patient: Jonathan Alexander.  Procedure(s) Performed: ECT TX  Patient Location: PACU  Anesthesia Type:General  Level of Consciousness: sedated  Airway & Oxygen Therapy: Patient Spontanous Breathing and Patient connected to face mask oxygen  Post-op Assessment: Report given to RN and Post -op Vital signs reviewed and stable  Post vital signs: Reviewed and stable  Last Vitals:  Vitals Value Taken Time  BP 122/89 04/26/19 1158  Temp 36.7 C 04/26/19 1157  Pulse 127 04/26/19 1159  Resp 13 04/26/19 1159  SpO2 100 % 04/26/19 1159  Vitals shown include unvalidated device data.  Last Pain:  Vitals:   04/26/19 1157  TempSrc:   PainSc: Asleep         Complications: No apparent anesthesia complications

## 2019-04-26 NOTE — Anesthesia Post-op Follow-up Note (Signed)
Anesthesia QCDR form completed.        

## 2019-04-26 NOTE — Anesthesia Procedure Notes (Signed)
Date/Time: 04/26/2019 11:47 AM Performed by: Dionne Bucy, CRNA Pre-anesthesia Checklist: Patient identified, Emergency Drugs available, Suction available and Patient being monitored Patient Re-evaluated:Patient Re-evaluated prior to induction Oxygen Delivery Method: Circle system utilized Preoxygenation: Pre-oxygenation with 100% oxygen Induction Type: IV induction Ventilation: Mask ventilation without difficulty and Mask ventilation throughout procedure Airway Equipment and Method: Bite block Placement Confirmation: positive ETCO2 Dental Injury: Teeth and Oropharynx as per pre-operative assessment

## 2019-04-27 NOTE — Anesthesia Postprocedure Evaluation (Signed)
Anesthesia Post Note  Patient: Jonathan Alexander.  Procedure(s) Performed: ECT TX  Patient location during evaluation: PACU Anesthesia Type: General Level of consciousness: awake and alert Pain management: pain level controlled Vital Signs Assessment: post-procedure vital signs reviewed and stable Respiratory status: spontaneous breathing, nonlabored ventilation and respiratory function stable Cardiovascular status: blood pressure returned to baseline and stable Postop Assessment: no signs of nausea or vomiting Anesthetic complications: no     Last Vitals:  Vitals:   04/26/19 1228 04/26/19 1230  BP: 134/86 128/82  Pulse: (!) 112 100  Resp: 12 12  Temp:  36.7 C  SpO2: 97%     Last Pain:  Vitals:   04/26/19 1230  TempSrc: Oral  PainSc: 0-No pain                 Demarus Latterell

## 2019-05-17 ENCOUNTER — Other Ambulatory Visit: Payer: Self-pay

## 2019-05-17 ENCOUNTER — Emergency Department: Payer: Medicare Other

## 2019-05-17 ENCOUNTER — Emergency Department
Admission: EM | Admit: 2019-05-17 | Discharge: 2019-05-17 | Disposition: A | Discharge status: Home / Self Care | Payer: Medicare Other | Attending: Emergency Medicine | Admitting: Emergency Medicine

## 2019-05-17 DIAGNOSIS — Z79899 Other long term (current) drug therapy: Secondary | ICD-10-CM | POA: Diagnosis not present

## 2019-05-17 DIAGNOSIS — I10 Essential (primary) hypertension: Secondary | ICD-10-CM | POA: Diagnosis not present

## 2019-05-17 DIAGNOSIS — Z20828 Contact with and (suspected) exposure to other viral communicable diseases: Secondary | ICD-10-CM | POA: Diagnosis not present

## 2019-05-17 DIAGNOSIS — J209 Acute bronchitis, unspecified: Secondary | ICD-10-CM | POA: Diagnosis not present

## 2019-05-17 DIAGNOSIS — R0602 Shortness of breath: Secondary | ICD-10-CM | POA: Diagnosis present

## 2019-05-17 DIAGNOSIS — F17210 Nicotine dependence, cigarettes, uncomplicated: Secondary | ICD-10-CM | POA: Diagnosis not present

## 2019-05-17 DIAGNOSIS — J40 Bronchitis, not specified as acute or chronic: Secondary | ICD-10-CM

## 2019-05-17 LAB — CBC
HCT: 41.5 % (ref 39.0–52.0)
Hemoglobin: 13.3 g/dL (ref 13.0–17.0)
MCH: 30.4 pg (ref 26.0–34.0)
MCHC: 32 g/dL (ref 30.0–36.0)
MCV: 95 fL (ref 80.0–100.0)
Platelets: 268 10*3/uL (ref 150–400)
RBC: 4.37 MIL/uL (ref 4.22–5.81)
RDW: 12.1 % (ref 11.5–15.5)
WBC: 7.1 10*3/uL (ref 4.0–10.5)
nRBC: 0 % (ref 0.0–0.2)

## 2019-05-17 LAB — BASIC METABOLIC PANEL
Anion gap: 6 (ref 5–15)
BUN: 9 mg/dL (ref 6–20)
CO2: 27 mmol/L (ref 22–32)
Calcium: 9.3 mg/dL (ref 8.9–10.3)
Chloride: 102 mmol/L (ref 98–111)
Creatinine, Ser: 0.95 mg/dL (ref 0.61–1.24)
GFR calc Af Amer: >60 mL/min (ref >60)
GFR calc non Af Amer: >60 mL/min (ref >60)
Glucose, Bld: 101 mg/dL — ABNORMAL HIGH (ref 70–99)
Potassium: 4.2 mmol/L (ref 3.5–5.1)
Sodium: 135 mmol/L (ref 135–145)

## 2019-05-17 MED ORDER — AZITHROMYCIN 250 MG PO TABS: ORAL_TABLET | ORAL | 0 refills | Status: AC

## 2019-05-17 MED ORDER — ALBUTEROL SULFATE HFA 108 (90 BASE) MCG/ACT IN AERS: 2.0000 | INHALATION_SPRAY | Freq: Four times a day (QID) | RESPIRATORY_TRACT | 0 refills | Status: DC | PRN

## 2019-05-17 NOTE — Social Work (Signed)
CSW spoke with the owner of the patient's group home, Hayden Pedro. He continued to share that he was in [place] and would not be able to pick the patient up. Nicole Kindred shared that he would see if someone can pick the patient up and will follow up with me if they can come get him.  Nicole Kindred shared that he plans to file a complaint with the ED.    Update: Someone will pick patient up in 10-12 minutes.     [place], [place] ED  [phone]

## 2019-05-17 NOTE — ED Notes (Signed)
Called and spoke with Jonathan Alexander legal guardian of pt and informed him of pt care plan. Per Mr. Jonathan Alexander he is in [facility] and if pt comes up for d/c call a cab and have him taken back to [place] home. Any further questions call him back.

## 2019-05-17 NOTE — ED Notes (Signed)
This RN spoke with Caregiver, Nicole Kindred and states he is unable to pick up pt because he is out of state.

## 2019-05-17 NOTE — ED Notes (Signed)
Pt discharged with Jonathan Alexander, staff from pt group home. PT in NAD, staff verbalize d/c understanding and RX to pick up. Pt unable to sign himself out due to baseline cognition

## 2019-05-17 NOTE — ED Provider Notes (Signed)
[facility] Emergency Department Provider Note  Time seen: 1:27 PM  I have reviewed the triage vital signs and the nursing notes.   HISTORY  Chief Complaint Shortness of Breath and Cough   HPI Jonathan Audel Coakley. is a 33 y.o. male with a past medical history of schizophrenia, hypertension, presents to the emergency department for 1 week of cough and nasal congestion.  According to the patient over the past 1 week he has been coughing and feels like his nose is congested.  Patient does smoke cigarettes.  Does not use any inhalers at home per patient.  Patient denies any known fever.  States he occasionally will cough up sputum.   Past Medical History:  Diagnosis Date  . Schizophrenia ([facility])   . Tachycardia     Patient Active Problem List   Diagnosis Date Noted  . Schizophrenia ([facility]) 01/13/2019  . Schizoaffective disorder, bipolar type ([facility]) 02/09/2018  . Hypertension 02/09/2018    History reviewed. No pertinent surgical history.  Prior to Admission medications   Medication Sig Start Date End Date Taking? Authorizing Provider  clonazePAM (KLONOPIN) 1 MG tablet Take 1 tablet (1 mg total) by mouth 3 (three) times daily. 01/26/19   Clapacs, Jackquline Denmark, MD  cloZAPine (CLOZARIL) 100 MG tablet TAKE 3 AND 1/2 TABLETS (350MG ) BY MOUTH AT BEDTIME 03/23/19   Clapacs, 03/25/19, MD  desmopressin (DDAVP) 0.2 MG tablet Take 1 tablet (0.2 mg total) by mouth at bedtime. 01/26/19   Clapacs, 01/28/19, MD  EPINEPHrine (EPIPEN 2-PAK) 0.3 mg/0.3 mL IJ SOAJ injection Inject 0.3 mg into the muscle as directed.    [provider]  ferrous sulfate 325 (65 FE) MG tablet Take 1 tablet (325 mg total) by mouth daily with breakfast. 01/26/19   Clapacs, 01/28/19, MD  imipramine (TOFRANIL) 50 MG tablet Take 2 tablets (100 mg total) by mouth at bedtime. 01/26/19   Clapacs, 01/28/19, MD  lithium carbonate (ESKALITH) 450 MG CR tablet Take 2 tablets (900 mg total) by mouth at bedtime. 01/26/19    Clapacs, 01/28/19, MD  loratadine (CLARITIN) 10 MG tablet Take 1 tablet (10 mg total) by mouth daily. 01/26/19   Clapacs, 01/28/19, MD  metFORMIN (GLUCOPHAGE) 500 MG tablet Take 1 tablet (500 mg total) by mouth 2 (two) times daily with a meal. 01/26/19   Clapacs, 01/28/19, MD  metoprolol succinate (TOPROL-XL) 100 MG 24 hr tablet Take 1 tablet (100 mg total) by mouth daily. 01/27/19   Clapacs, 01/29/19, MD  OLANZapine (ZYPREXA) 20 MG tablet Take 1 tablet (20 mg total) by mouth at bedtime. 01/26/19   Clapacs, 01/28/19, MD  polyethylene glycol (MIRALAX / GLYCOLAX) 17 g packet Take 17 g by mouth every morning. 01/26/19   Clapacs, 01/28/19, MD  venlafaxine XR (EFFEXOR-XR) 75 MG 24 hr capsule Take 1 capsule (75 mg total) by mouth daily with breakfast. 01/27/19   Clapacs, 01/29/19, MD    Allergies  Allergen Reactions  . Divalproex Sodium   . Shellfish-Derived Products     Family History  Family history unknown: Yes    Social History Social History   Tobacco Use  . Smoking status: Current Every Day Smoker    Packs/day: 1.00    Years: 4.00    Pack years: 4.00    Types: Cigarettes  . Smokeless tobacco: Never Used  Substance Use Topics  . Alcohol use: No  . Drug use: No    Review of Systems  Constitutional: Negative for fever. ENT: Positive for nasal congestion Cardiovascular: Negative for chest pain. Respiratory: Mild shortness of breath.  Positive for cough with sputum production Gastrointestinal: Negative for abdominal pain, vomiting and diarrhea. Musculoskeletal: Negative for musculoskeletal complaints Skin: Negative for skin complaints  Neurological: Negative for headache All other ROS negative  ____________________________________________   PHYSICAL EXAM:  VITAL SIGNS: ED Triage Vitals  Enc Vitals Group     BP 05/17/19 1154 124/78     Pulse Rate 05/17/19 1154 (!) 108     Resp 05/17/19 1154 19     Temp 05/17/19 1154 99.6 F (37.6 C)     Temp Source 05/17/19 1154 Oral     SpO2 05/17/19  1154 97 %     Weight 05/17/19 1154 235 lb (106.6 kg)     Height 05/17/19 1154 6\' 1"  (1.854 m)     Head Circumference --      Peak Flow --      Pain Score 05/17/19 1159 0     Pain Loc --      Pain Edu? --      Excl. in GC? --    Constitutional: Alert. Well appearing and in no distress. Eyes: Normal exam ENT      Head: Normocephalic and atraumatic      Mouth/Throat: Mucous membranes are moist. Cardiovascular: Normal rate, regular rhythm. Respiratory: Normal respiratory effort without tachypnea nor retractions.  Slight expiratory wheeze bilaterally without rales or rhonchi. Gastrointestinal: Soft and nontender. No distention.  Musculoskeletal: Nontender with normal range of motion in all extremities. Neurologic:  Normal speech and language. No gross focal neurologic deficits Skin:  Skin is warm, dry and intact.  Psychiatric: Mood and affect are normal.  ____________________________________________    EKG  EKG viewed and interpreted by myself shows sinus tachycardia 108 bpm with a narrow QRS, normal axis, normal intervals, nonspecific ST changes.  ____________________________________________    RADIOLOGY  Chest x-ray consistent with bronchitis.  ____________________________________________   INITIAL IMPRESSION / ASSESSMENT AND PLAN / ED COURSE  Pertinent labs & imaging results that were available during my care of the patient were reviewed by me and considered in my medical decision making (see chart for details).   Patient presents to the emergency department for shortness of breath and cough with sputum production x1 week.  Patient does have a low-grade temperature 99.6 in the emergency department.  Slight expiratory wheeze on exam.  Patient is a daily smoker per patient.  Differential would include bronchitis, less likely pneumonia given clear chest x-ray, COVID, upper respiratory infection.  Given possibility of bronchitis with exam consistent with bronchitis we will cover  with Zithromax, place the patient on an albuterol inhaler.  We will swab for COVID and discharge the patient with isolation precautions until his results return.  Overall patient is extremely well-appearing.  No distress.  Asking for food.  Jonathan Horton MarshallSantana Jr. was evaluated in Emergency Department on 05/17/2019 for the symptoms described in the history of present illness. He was evaluated in the context of the global COVID-19 pandemic, which necessitated consideration that the patient might be at risk for infection with the SARS-CoV-2 virus that causes COVID-19. Institutional protocols and algorithms that pertain to the evaluation of patients at risk for COVID-19 are in a state of rapid change based on information released by regulatory bodies including the CDC and federal and state organizations. These policies and algorithms were followed during the patient's care in the ED.  ____________________________________________   FINAL CLINICAL  IMPRESSION(S) / ED DIAGNOSES  Acute bronchitis   Harvest Dark, MD 05/17/19 1330

## 2019-05-17 NOTE — ED Notes (Signed)
Pt given water per request

## 2019-05-17 NOTE — Social Work (Signed)
CSW aware of social consult. CSW will call group home to see if they can provide transportation for patient.     [place], [place] ED  [phone]

## 2019-05-17 NOTE — Discharge Instructions (Addendum)
You have been tested today for coronavirus.  Your swabs should resolve in the next 24 hours.  If it is positive you will receive a call at home, otherwise it will show up online on your MyChart.  Please remain isolated and use a facemask until you know your results.

## 2019-05-17 NOTE — ED Triage Notes (Signed)
Pt comes into the ED via EMS from home with c/o sinus congestion and audible wheezing. Pt is in NAD.

## 2019-05-17 NOTE — ED Triage Notes (Signed)
Pt comes via [facility] from home with c/o SOB and cough that started 2-3 days ago. Pt states he has productive cough that is gray and brown in color.  Pt denies being around anyone with COVID. Pt denies any CP, N/V, and abdominal pain. Pt also states some back pain and lots of diarrhea.

## 2019-05-17 NOTE — ED Notes (Signed)
SW at bedside.

## 2019-05-17 NOTE — ED Notes (Signed)
Charge RN aware of pt disposition , Social work notified to follow up with discharge transportation

## 2019-05-18 LAB — SARS CORONAVIRUS 2 (TAT 6-24 HRS): SARS Coronavirus 2: NEGATIVE

## 2019-05-26 ENCOUNTER — Telehealth: Payer: Self-pay

## 2019-05-27 ENCOUNTER — Other Ambulatory Visit: Payer: Self-pay

## 2019-05-27 ENCOUNTER — Other Ambulatory Visit: Payer: Self-pay | Admitting: Psychiatry

## 2019-05-27 ENCOUNTER — Other Ambulatory Visit
Admission: RE | Admit: 2019-05-27 | Discharge: 2019-05-27 | Disposition: A | Discharge status: Home / Self Care | Payer: Medicare Other | Source: Ambulatory Visit | Attending: Psychiatry | Admitting: Psychiatry

## 2019-05-27 DIAGNOSIS — Z20828 Contact with and (suspected) exposure to other viral communicable diseases: Secondary | ICD-10-CM | POA: Diagnosis not present

## 2019-05-27 DIAGNOSIS — Z01812 Encounter for preprocedural laboratory examination: Secondary | ICD-10-CM | POA: Insufficient documentation

## 2019-05-27 LAB — SARS CORONAVIRUS 2 (TAT 6-24 HRS): SARS Coronavirus 2: NEGATIVE

## 2019-05-28 ENCOUNTER — Ambulatory Visit: Payer: Self-pay | Admitting: Anesthesiology

## 2019-05-28 ENCOUNTER — Encounter: Payer: Self-pay | Admitting: Anesthesiology

## 2019-05-28 ENCOUNTER — Encounter
Admission: RE | Admit: 2019-05-28 | Discharge: 2019-05-28 | Disposition: A | Discharge status: Home / Self Care | Payer: Medicare Other | Source: Ambulatory Visit | Attending: Psychiatry | Admitting: Psychiatry

## 2019-05-28 DIAGNOSIS — F259 Schizoaffective disorder, unspecified: Secondary | ICD-10-CM | POA: Diagnosis present

## 2019-05-28 DIAGNOSIS — F332 Major depressive disorder, recurrent severe without psychotic features: Secondary | ICD-10-CM | POA: Diagnosis not present

## 2019-05-28 DIAGNOSIS — F17210 Nicotine dependence, cigarettes, uncomplicated: Secondary | ICD-10-CM | POA: Diagnosis not present

## 2019-05-28 MED ORDER — SUCCINYLCHOLINE CHLORIDE 200 MG/10ML IV SOSY
PREFILLED_SYRINGE | INTRAVENOUS | Status: DC | PRN
  Administered 2019-05-28: 100 mg via INTRAVENOUS

## 2019-05-28 MED ORDER — MIDAZOLAM HCL 2 MG/2ML IJ SOLN
INTRAMUSCULAR | Status: DC | PRN
  Administered 2019-05-28: 1 mg via INTRAVENOUS

## 2019-05-28 MED ORDER — KETOROLAC TROMETHAMINE 30 MG/ML IJ SOLN
INTRAMUSCULAR | Status: AC
  Administered 2019-05-28: 30 mg via INTRAVENOUS
  Filled 2019-05-28: qty 1

## 2019-05-28 MED ORDER — SODIUM CHLORIDE 0.9 % IV SOLN
500.0000 mL | Freq: Once | INTRAVENOUS | Status: AC
  Administered 2019-05-28: 12:00:00 via INTRAVENOUS

## 2019-05-28 MED ORDER — MIDAZOLAM HCL 2 MG/2ML IJ SOLN
INTRAMUSCULAR | Status: AC
  Filled 2019-05-28: qty 2

## 2019-05-28 MED ORDER — GLYCOPYRROLATE 0.2 MG/ML IJ SOLN
0.1000 mg | Freq: Once | INTRAMUSCULAR | Status: AC
  Administered 2019-05-28: 12:00:00 0.1 mg via INTRAVENOUS

## 2019-05-28 MED ORDER — METHOHEXITAL SODIUM 100 MG/10ML IV SOSY
PREFILLED_SYRINGE | INTRAVENOUS | Status: DC | PRN
  Administered 2019-05-28: 70 mg via INTRAVENOUS

## 2019-05-28 MED ORDER — KETOROLAC TROMETHAMINE 30 MG/ML IJ SOLN
30.0000 mg | Freq: Once | INTRAMUSCULAR | Status: AC
  Administered 2019-05-28: 12:00:00 30 mg via INTRAVENOUS

## 2019-05-28 MED ORDER — SUCCINYLCHOLINE CHLORIDE 20 MG/ML IJ SOLN
INTRAMUSCULAR | Status: AC
  Filled 2019-05-28: qty 1

## 2019-05-28 MED ORDER — GLYCOPYRROLATE 0.2 MG/ML IJ SOLN
INTRAMUSCULAR | Status: AC
  Administered 2019-05-28: 0.1 mg via INTRAVENOUS
  Filled 2019-05-28: qty 1

## 2019-05-28 NOTE — Procedures (Signed)
ECT SERVICES Physician's Interval Evaluation & Treatment Note  Patient Identification: Cruze Mike Gip. MRN:  [id number] Date of Evaluation:  05/28/2019 TX #: 41  MADRS:   MMSE:   P.E. Findings:  No change physical exam  Psychiatric Interval Note:  Hyper religious and a little overly verbal  Subjective:  Patient is a 33 y.o. male seen for evaluation for Electroconvulsive Therapy. No complaint  Treatment Summary:   []   Right Unilateral             [x]  Bilateral   % Energy : 1.0 ms 100%   Impedance: 940 ohms  Seizure Energy Index: 14,384 V squared  Postictal Suppression Index: 94%  Seizure Concordance Index: 99%  Medications  Pre Shock: Robinul 0.2 mg Brevital 70 mg succinylcholine 100 mg  Post Shock: Versed 1 mg  Seizure Duration: 27 seconds EMG 31 seconds EEG   Comments: Follow-up in the usual time interval for 5 weeks  Lungs:  [x]   Clear to auscultation               []  Other:   Heart:    [x]   Regular rhythm             []  irregular rhythm    [x]   Previous H&P reviewed, patient examined and there are NO CHANGES                 []   Previous H&P reviewed, patient examined and there are changes noted.   Alethia Berthold, MD 10/23/202011:54 AM

## 2019-05-28 NOTE — H&P (Signed)
Jonathan Alexander. is an 33 y.o. male.   Chief Complaint: doing well HPI: schizoaffective disorder  Past Medical History:  Diagnosis Date  . Schizophrenia ([place])   . Tachycardia     History reviewed. No pertinent surgical history.  Family History  Family history unknown: Yes   Social History:  reports that he has been smoking cigarettes. He has a 4.00 pack-year smoking history. He has never used smokeless tobacco. He reports that he does not drink alcohol or use drugs.  Allergies:  Allergies  Allergen Reactions  . Divalproex Sodium   . Shellfish-Derived Products     (Not in a hospital admission)   Results for orders placed or performed during the hospital encounter of 05/27/19 (from the past 48 hour(s))  SARS CORONAVIRUS 2 (TAT 6-24 HRS) Nasopharyngeal Nasopharyngeal Swab     Status: None   Collection Time: 05/27/19 10:55 AM   Specimen: Nasopharyngeal Swab  Result Value Ref Range   SARS Coronavirus 2 NEGATIVE NEGATIVE    Comment: (NOTE) SARS-CoV-2 target nucleic acids are NOT DETECTED. The SARS-CoV-2 RNA is generally detectable in upper and lower respiratory specimens during the acute phase of infection. Negative results do not preclude SARS-CoV-2 infection, do not rule out co-infections with other pathogens, and should not be used as the sole basis for treatment or other patient management decisions. Negative results must be combined with clinical observations, patient history, and epidemiological information. The expected result is Negative. Fact Sheet for Patients: SugarRoll.be Fact Sheet for Healthcare Providers: https://www.woods-mathews.com/ This test is not yet approved or cleared by the [place] FDA and  has been authorized for detection and/or diagnosis of SARS-CoV-2 by FDA under an Emergency Use Authorization (EUA). This EUA will remain  in effect (meaning this test can be used) for the duration of the COVID-19  declaration under Section 56 4(b)(1) of the Act, 21 U.S.C. section 360bbb-3(b)(1), unless the authorization is terminated or revoked sooner. Performed at [facility], [place] [address], [place], [place] [address]    No results found.  Review of Systems  Constitutional: Negative.   HENT: Negative.   Eyes: Negative.   Respiratory: Negative.   Cardiovascular: Negative.   Gastrointestinal: Negative.   Musculoskeletal: Negative.   Skin: Negative.   Neurological: Negative.   Psychiatric/Behavioral: Negative.     Blood pressure 134/71, pulse (!) 112, temperature 98.1 F (36.7 C), temperature source Oral, resp. rate 16, SpO2 98 %. Physical Exam  Nursing note and vitals reviewed. Constitutional: He appears well-developed and well-nourished.  HENT:  Head: Normocephalic and atraumatic.  Eyes: Pupils are equal, round, and reactive to light. Conjunctivae are normal.  Neck: Normal range of motion.  Cardiovascular: Regular rhythm and normal heart sounds.  Respiratory: Effort normal.  GI: Soft.  Musculoskeletal: Normal range of motion.  Neurological: He is alert.  Skin: Skin is warm and dry.  Psychiatric: He has a normal mood and affect. His behavior is normal. Judgment and thought content normal.     Assessment/Plan Continue maintenance scedule  Alethia Berthold, MD 05/28/2019, 9:48 AM

## 2019-05-28 NOTE — Anesthesia Procedure Notes (Signed)
Date/Time: 05/28/2019 11:51 AM Performed by: Dionne Bucy, CRNA Pre-anesthesia Checklist: Patient identified, Emergency Drugs available, Suction available and Patient being monitored Patient Re-evaluated:Patient Re-evaluated prior to induction Oxygen Delivery Method: Circle system utilized Preoxygenation: Pre-oxygenation with 100% oxygen Induction Type: IV induction Ventilation: Mask ventilation without difficulty and Mask ventilation throughout procedure Airway Equipment and Method: Bite block Placement Confirmation: positive ETCO2 Dental Injury: Teeth and Oropharynx as per pre-operative assessment

## 2019-05-28 NOTE — Transfer of Care (Signed)
Immediate Anesthesia Transfer of Care Note  Patient: Jonathan Alexander.  Procedure(s) Performed: ECT TX  Patient Location: PACU  Anesthesia Type:General  Level of Consciousness: sedated  Airway & Oxygen Therapy: Patient Spontanous Breathing and Patient connected to face mask oxygen  Post-op Assessment: Report given to RN and Post -op Vital signs reviewed and stable  Post vital signs: Reviewed and stable  Last Vitals:  Vitals Value Taken Time  BP 136/80 05/28/19 1201  Temp    Pulse 127 05/28/19 1205  Resp 14 05/28/19 1205  SpO2 96 % 05/28/19 1205  Vitals shown include unvalidated device data.  Last Pain:  Vitals:   05/28/19 0908  TempSrc:   PainSc: 0-No pain         Complications: No apparent anesthesia complications

## 2019-05-28 NOTE — Discharge Instructions (Signed)
1)  The drugs that you have been given will stay in your system until tomorrow so for the       next 24 hours you should not: ° A. Drive an automobile ° B. Make any legal decisions ° C. Drink any alcoholic beverages ° °2)  You may resume your regular meals upon return home. ° °3)  A responsible adult must take you home.  Someone should stay with you for a few          hours, then be available by phone for the remainder of the treatment day. ° °4)  You May experience any of the following symptoms: ° Headache, Nausea and a dry mouth (due to the medications you were given),  temporary memory loss and some confusion, or sore muscles (a warm bath  should help this).  If you you experience any of these symptoms let us know on                your return visit. ° °5)  Report any of the following: any acute discomfort, severe headache, or temperature        greater than 100.5 F.   Also report any unusual redness, swelling, drainage, or pain         at your IV site. ° °  You may report Symptoms to:  ECT PROGRAM- [place] at [facility] °         Phone: [phone], ECT Department  °         or Dr. Clapac's office [phone] ° °6)  Your next ECT Treatment is Friday November 20  ° We will call 2 days prior to your scheduled appointment for arrival times. ° °7)  Nothing to eat or drink after midnight the night before your procedure. ° °8)  Take      With a sip of water the morning of your procedure. ° °9)  Other Instructions: Call [phone] to cancel the morning of your procedure due         to illness or emergency. ° °10) We will call within 72 hours to assess how you are feeling.  °

## 2019-05-28 NOTE — Anesthesia Preprocedure Evaluation (Signed)
Anesthesia Evaluation  Patient identified by MRN, date of birth, ID band Patient awake    Reviewed: Allergy & Precautions, NPO status , Patient's Chart, lab work & pertinent test results, reviewed documented beta blocker date and time   History of Anesthesia Complications Negative for: history of anesthetic complications  Airway Mallampati: II  TM Distance: >3 FB     Dental  (+) Chipped   Pulmonary neg sleep apnea, neg COPD, Current Smoker and Patient abstained from smoking., former smoker,    breath sounds clear to auscultation- rhonchi (-) wheezing      Cardiovascular Exercise Tolerance: Good hypertension, (-) CAD, (-) Past MI and (-) Cardiac Stents  Rhythm:Regular Rate:Normal - Systolic murmurs and - Diastolic murmurs    Neuro/Psych PSYCHIATRIC DISORDERS Bipolar Disorder Schizophrenia negative neurological ROS     GI/Hepatic negative GI ROS, Neg liver ROS,   Endo/Other  diabetes, Oral Hypoglycemic Agents  Renal/GU negative Renal ROS     Musculoskeletal negative musculoskeletal ROS (+)   Abdominal (+) + obese,   Peds  Hematology negative hematology ROS (+)   Anesthesia Other Findings Past Medical History: No date: Schizophrenia (HCC) No date: Tachycardia    Reproductive/Obstetrics                             Anesthesia Physical  Anesthesia Plan  ASA: III  Anesthesia Plan: General   Post-op Pain Management:    Induction: Intravenous  PONV Risk Score and Plan: 2 and Ondansetron  Airway Management Planned: Mask  Additional Equipment:   Intra-op Plan:   Post-operative Plan:   Informed Consent: I have reviewed the patients History and Physical, chart, labs and discussed the procedure including the risks, benefits and alternatives for the proposed anesthesia with the patient or authorized representative who has indicated his/her understanding and acceptance.       Plan  Discussed with: CRNA and Anesthesiologist  Anesthesia Plan Comments: (Ate apple pie at 0330, plan to start at 1130)        Anesthesia Quick Evaluation  

## 2019-05-28 NOTE — Anesthesia Postprocedure Evaluation (Signed)
Anesthesia Post Note  Patient: Jonathan Alexander.  Procedure(s) Performed: ECT TX  Patient location during evaluation: PACU Anesthesia Type: General Level of consciousness: awake and alert and oriented Pain management: pain level controlled Vital Signs Assessment: post-procedure vital signs reviewed and stable Respiratory status: spontaneous breathing, nonlabored ventilation and respiratory function stable Cardiovascular status: blood pressure returned to baseline and stable Postop Assessment: no signs of nausea or vomiting Anesthetic complications: no     Last Vitals:  Vitals:   05/28/19 1229 05/28/19 1237  BP: 124/77 125/78  Pulse: (!) 120 (!) 115  Resp: (!) 21 18  Temp:  36.8 C  SpO2: 95%     Last Pain:  Vitals:   05/28/19 1237  TempSrc: Oral  PainSc: 0-No pain                 Dorsie Sethi

## 2019-05-28 NOTE — Anesthesia Post-op Follow-up Note (Signed)
Anesthesia QCDR form completed.        

## 2019-06-14 ENCOUNTER — Telehealth: Payer: Self-pay

## 2019-06-18 ENCOUNTER — Telehealth: Payer: Self-pay

## 2019-06-18 ENCOUNTER — Other Ambulatory Visit
Admission: RE | Admit: 2019-06-18 | Discharge: 2019-06-18 | Disposition: A | Discharge status: Home / Self Care | Payer: Medicare Other | Source: Ambulatory Visit | Attending: Psychiatry | Admitting: Psychiatry

## 2019-06-18 ENCOUNTER — Other Ambulatory Visit: Payer: Self-pay

## 2019-06-18 DIAGNOSIS — Z01812 Encounter for preprocedural laboratory examination: Secondary | ICD-10-CM | POA: Diagnosis present

## 2019-06-18 DIAGNOSIS — Z20828 Contact with and (suspected) exposure to other viral communicable diseases: Secondary | ICD-10-CM | POA: Insufficient documentation

## 2019-06-18 LAB — SARS CORONAVIRUS 2 (TAT 6-24 HRS): SARS Coronavirus 2: NEGATIVE

## 2019-06-21 ENCOUNTER — Encounter: Payer: Self-pay | Admitting: Certified Registered Nurse Anesthetist

## 2019-06-21 ENCOUNTER — Other Ambulatory Visit: Payer: Self-pay

## 2019-06-21 ENCOUNTER — Encounter
Admission: RE | Admit: 2019-06-21 | Discharge: 2019-06-21 | Disposition: A | Discharge status: Home / Self Care | Payer: Medicare Other | Source: Ambulatory Visit | Attending: Psychiatry | Admitting: Psychiatry

## 2019-06-21 DIAGNOSIS — Z888 Allergy status to other drugs, medicaments and biological substances status: Secondary | ICD-10-CM | POA: Diagnosis not present

## 2019-06-21 DIAGNOSIS — F209 Schizophrenia, unspecified: Secondary | ICD-10-CM | POA: Insufficient documentation

## 2019-06-21 DIAGNOSIS — E669 Obesity, unspecified: Secondary | ICD-10-CM | POA: Diagnosis not present

## 2019-06-21 DIAGNOSIS — F250 Schizoaffective disorder, bipolar type: Secondary | ICD-10-CM

## 2019-06-21 DIAGNOSIS — Z79899 Other long term (current) drug therapy: Secondary | ICD-10-CM | POA: Insufficient documentation

## 2019-06-21 DIAGNOSIS — F319 Bipolar disorder, unspecified: Secondary | ICD-10-CM | POA: Insufficient documentation

## 2019-06-21 DIAGNOSIS — Z6831 Body mass index (BMI) 31.0-31.9, adult: Secondary | ICD-10-CM | POA: Insufficient documentation

## 2019-06-21 DIAGNOSIS — I10 Essential (primary) hypertension: Secondary | ICD-10-CM | POA: Diagnosis not present

## 2019-06-21 DIAGNOSIS — F17210 Nicotine dependence, cigarettes, uncomplicated: Secondary | ICD-10-CM | POA: Diagnosis not present

## 2019-06-21 DIAGNOSIS — E119 Type 2 diabetes mellitus without complications: Secondary | ICD-10-CM | POA: Diagnosis not present

## 2019-06-21 DIAGNOSIS — Z7984 Long term (current) use of oral hypoglycemic drugs: Secondary | ICD-10-CM | POA: Insufficient documentation

## 2019-06-21 MED ORDER — SODIUM CHLORIDE 0.9 % IV SOLN
500.0000 mL | Freq: Once | INTRAVENOUS | Status: AC
  Administered 2019-06-21: 500 mL via INTRAVENOUS

## 2019-06-21 MED ORDER — SUCCINYLCHOLINE CHLORIDE 20 MG/ML IJ SOLN
INTRAMUSCULAR | Status: AC
  Filled 2019-06-21: qty 1

## 2019-06-21 MED ORDER — SUCCINYLCHOLINE CHLORIDE 200 MG/10ML IV SOSY
PREFILLED_SYRINGE | INTRAVENOUS | Status: DC | PRN
  Administered 2019-06-21: 100 mg via INTRAVENOUS

## 2019-06-21 MED ORDER — METHOHEXITAL SODIUM 100 MG/10ML IV SOSY
PREFILLED_SYRINGE | INTRAVENOUS | Status: DC | PRN
  Administered 2019-06-21: 70 mg via INTRAVENOUS

## 2019-06-21 MED ORDER — SODIUM CHLORIDE 0.9 % IV SOLN
INTRAVENOUS | Status: DC | PRN
  Administered 2019-06-21: 11:00:00 via INTRAVENOUS

## 2019-06-21 MED ORDER — MIDAZOLAM HCL 2 MG/2ML IJ SOLN
INTRAMUSCULAR | Status: AC
  Filled 2019-06-21: qty 2

## 2019-06-21 MED ORDER — MIDAZOLAM HCL 2 MG/2ML IJ SOLN
INTRAMUSCULAR | Status: DC | PRN
  Administered 2019-06-21: 1 mg via INTRAVENOUS

## 2019-06-21 NOTE — H&P (Signed)
Jonathan Alexander. is an 33 y.o. male.   Chief Complaint: No new complaints HPI: History of recurrent schizoaffective disorder  Past Medical History:  Diagnosis Date  . Schizophrenia ([place])   . Tachycardia     History reviewed. No pertinent surgical history.  Family History  Family history unknown: Yes   Social History:  reports that he has been smoking cigarettes. He has a 4.00 pack-year smoking history. He has never used smokeless tobacco. He reports that he does not drink alcohol or use drugs.  Allergies:  Allergies  Allergen Reactions  . Divalproex Sodium   . Shellfish-Derived Products     (Not in a hospital admission)   No results found for this or any previous visit (from the past 48 hour(s)). No results found.  Review of Systems  Constitutional: Negative.   HENT: Negative.   Eyes: Negative.   Respiratory: Negative.   Cardiovascular: Negative.   Gastrointestinal: Negative.   Musculoskeletal: Negative.   Skin: Negative.   Neurological: Negative.   Psychiatric/Behavioral: Negative.     Blood pressure 135/88, pulse (!) 101, temperature 98.2 F (36.8 C), temperature source Oral, resp. rate 16, height 6\' 2"  (1.88 m), weight 113.9 kg, SpO2 100 %. Physical Exam  Nursing note and vitals reviewed. Constitutional: He appears well-developed and well-nourished.  HENT:  Head: Normocephalic and atraumatic.  Eyes: Pupils are equal, round, and reactive to light. Conjunctivae are normal.  Neck: Normal range of motion.  Cardiovascular: Regular rhythm and normal heart sounds.  Respiratory: Effort normal.  GI: Soft.  Musculoskeletal: Normal range of motion.  Neurological: He is alert.  Skin: Skin is warm and dry.  Psychiatric: He has a normal mood and affect. His behavior is normal. Judgment and thought content normal.     Assessment/Plan Follow-up in 4 weeks  Alethia Berthold, MD 06/21/2019, 10:24 AM

## 2019-06-21 NOTE — Anesthesia Procedure Notes (Addendum)
Date/Time: 06/21/2019 10:39 AM Performed by: Caryl Asp, CRNA Pre-anesthesia Checklist: Patient identified, Emergency Drugs available, Suction available and Patient being monitored Patient Re-evaluated:Patient Re-evaluated prior to induction Oxygen Delivery Method: Circle system utilized Preoxygenation: Pre-oxygenation with 100% oxygen Induction Type: IV induction Ventilation: Mask ventilation throughout procedure and Two handed mask ventilation required Airway Equipment and Method: Bite block Placement Confirmation: positive ETCO2 Dental Injury: Teeth and Oropharynx as per pre-operative assessment

## 2019-06-21 NOTE — Procedures (Signed)
ECT SERVICES Physician's Interval Evaluation & Treatment Note  Patient Identification: Jianni Mike Gip. MRN:  [id number] Date of Evaluation:  06/21/2019 TX #: 42  MADRS:   MMSE:   P.E. Findings:  No change physical exam  Psychiatric Interval Note:  Mood a little euphoric but not problematically so  Subjective:  Patient is a 33 y.o. male seen for evaluation for Electroconvulsive Therapy. No specific complaint  Treatment Summary:   []   Right Unilateral             [x]  Bilateral   % Energy : 1.0 ms 100%   Impedance: 1110 ohms  Seizure Energy Index: 16,477 V squared  Postictal Suppression Index: 59%  Seizure Concordance Index: 97%  Medications  Pre Shock: Robinul 0.2 mg Brevital 70 mg succinylcholine 100 mg  Post Shock: Versed 1 mg  Seizure Duration: 19 seconds EMG 33 seconds EEG   Comments: Follow-up 4 weeks  Lungs:  [x]   Clear to auscultation               []  Other:   Heart:    [x]   Regular rhythm             []  irregular rhythm    [x]   Previous H&P reviewed, patient examined and there are NO CHANGES                 []   Previous H&P reviewed, patient examined and there are changes noted.   Alethia Berthold, MD 11/16/202010:48 AM

## 2019-06-21 NOTE — Anesthesia Postprocedure Evaluation (Signed)
Anesthesia Post Note  Patient: Jonathan Alexander.  Procedure(s) Performed: ECT TX  Patient location during evaluation: PACU Level of consciousness: awake and alert and oriented Pain management: pain level controlled Vital Signs Assessment: post-procedure vital signs reviewed and stable Respiratory status: spontaneous breathing Cardiovascular status: blood pressure returned to baseline Anesthetic complications: no     Last Vitals:  Vitals:   06/21/19 1124 06/21/19 1154  BP: 132/83 130/80  Pulse: (!) 123 (!) 120  Resp: (!) 22 20  Temp: (!) 36.3 C 36.8 C  SpO2: 97%     Last Pain:  Vitals:   06/21/19 1154  TempSrc: Oral  PainSc: 0-No pain                 Shaunda Tipping

## 2019-06-21 NOTE — Discharge Instructions (Signed)
1)  The drugs that you have been given will stay in your system until tomorrow so for the       next 24 hours you should not:  A. Drive an automobile  B. Make any legal decisions  C. Drink any alcoholic beverages  2)  You may resume your regular meals upon return home.  3)  A responsible adult must take you home.  Someone should stay with you for a few          hours, then be available by phone for the remainder of the treatment day.  4)  You May experience any of the following symptoms:  Headache, Nausea and a dry mouth (due to the medications you were given),  temporary memory loss and some confusion, or sore muscles (a warm bath  should help this).  If you you experience any of these symptoms let us know on                your return visit.  5)  Report any of the following: any acute discomfort, severe headache, or temperature        greater than 100.5 F.   Also report any unusual redness, swelling, drainage, or pain         at your IV site.    You may report Symptoms to:  [place] at [facility]          Phone: [phone], ECT Department           or Dr. Prescott Gum office [phone]  6)  Your next ECT Treatment is Monday December 14  We will call 2 days prior to your scheduled appointment for arrival times.  7)  Nothing to eat or drink after midnight the night before your procedure.  8)  Take morning meds with a sip of water the morning of your procedure.  9)  Other Instructions: Call [phone] to cancel the morning of your procedure due         to illness or emergency.  10) We will call within 72 hours to assess how you are feeling.

## 2019-06-21 NOTE — H&P (Signed)
Jonathan Alexander. is an 33 y.o. male.   Chief Complaint: Patient has no specific complaint today.  He seems a little euphoric but not bizarre or agitated. HPI: History of recurrent schizoaffective disorder  Past Medical History:  Diagnosis Date  . Schizophrenia ([place])   . Tachycardia     History reviewed. No pertinent surgical history.  Family History  Family history unknown: Yes   Social History:  reports that he has been smoking cigarettes. He has a 4.00 pack-year smoking history. He has never used smokeless tobacco. He reports that he does not drink alcohol or use drugs.  Allergies:  Allergies  Allergen Reactions  . Divalproex Sodium   . Shellfish-Derived Products     (Not in a hospital admission)   No results found for this or any previous visit (from the past 48 hour(s)). No results found.  Review of Systems  Constitutional: Negative.   HENT: Negative.   Eyes: Negative.   Respiratory: Negative.   Cardiovascular: Negative.   Gastrointestinal: Negative.   Musculoskeletal: Negative.   Skin: Negative.   Neurological: Negative.   Psychiatric/Behavioral: Negative.     Blood pressure 135/88, pulse (!) 101, temperature 98.2 F (36.8 C), temperature source Oral, resp. rate 16, height 6\' 2"  (1.88 m), weight 113.9 kg, SpO2 100 %. Physical Exam  Nursing note and vitals reviewed. Constitutional: He appears well-developed and well-nourished.  HENT:  Head: Normocephalic and atraumatic.  Eyes: Pupils are equal, round, and reactive to light. Conjunctivae are normal.  Neck: Normal range of motion.  Cardiovascular: Regular rhythm and normal heart sounds.  Respiratory: Effort normal. No respiratory distress.  GI: Soft.  Musculoskeletal: Normal range of motion.  Neurological: He is alert.  Skin: Skin is warm and dry.  Psychiatric: Judgment normal. His affect is labile. His speech is rapid and/or pressured and tangential. He is not agitated and not aggressive. Thought  content is delusional. Thought content is not paranoid. Cognition and memory are normal. He expresses no homicidal and no suicidal ideation.     Assessment/Plan Follow-up in another 4 weeks after treatment today  Alethia Berthold, MD 06/21/2019, 10:36 AM

## 2019-06-21 NOTE — Anesthesia Preprocedure Evaluation (Signed)
Anesthesia Evaluation  Patient identified by MRN, date of birth, ID band Patient awake    Reviewed: Allergy & Precautions, NPO status , Patient's Chart, lab work & pertinent test results, reviewed documented beta blocker date and time   History of Anesthesia Complications Negative for: history of anesthetic complications  Airway Mallampati: II  TM Distance: >3 FB     Dental  (+) Chipped   Pulmonary neg sleep apnea, neg COPD, Current Smoker and Patient abstained from smoking., former smoker,    breath sounds clear to auscultation- rhonchi (-) wheezing      Cardiovascular Exercise Tolerance: Good hypertension, (-) CAD, (-) Past MI and (-) Cardiac Stents  Rhythm:Regular Rate:Normal - Systolic murmurs and - Diastolic murmurs    Neuro/Psych PSYCHIATRIC DISORDERS Bipolar Disorder Schizophrenia negative neurological ROS     GI/Hepatic negative GI ROS, Neg liver ROS,   Endo/Other  diabetes, Oral Hypoglycemic Agents  Renal/GU negative Renal ROS     Musculoskeletal negative musculoskeletal ROS (+)   Abdominal (+) + obese,   Peds  Hematology negative hematology ROS (+)   Anesthesia Other Findings Past Medical History: No date: Schizophrenia (HCC) No date: Tachycardia    Reproductive/Obstetrics                             Anesthesia Physical  Anesthesia Plan  ASA: III  Anesthesia Plan: General   Post-op Pain Management:    Induction: Intravenous  PONV Risk Score and Plan: 2 and Ondansetron  Airway Management Planned: Mask  Additional Equipment:   Intra-op Plan:   Post-operative Plan:   Informed Consent: I have reviewed the patients History and Physical, chart, labs and discussed the procedure including the risks, benefits and alternatives for the proposed anesthesia with the patient or authorized representative who has indicated his/her understanding and acceptance.       Plan  Discussed with: CRNA and Anesthesiologist  Anesthesia Plan Comments: (Ate apple pie at 0330, plan to start at 1130)        Anesthesia Quick Evaluation  

## 2019-06-21 NOTE — Anesthesia Post-op Follow-up Note (Signed)
Anesthesia QCDR form completed.        

## 2019-06-21 NOTE — Transfer of Care (Signed)
Immediate Anesthesia Transfer of Care Note  Patient: Jonathan Alexander.  Procedure(s) Performed: ECT TX  Patient Location: PACU  Anesthesia Type:General  Level of Consciousness: sedated  Airway & Oxygen Therapy: Patient Spontanous Breathing and Patient connected to face mask oxygen  Post-op Assessment: Report given to RN and Post -op Vital signs reviewed and stable  Post vital signs: Reviewed and stable  Last Vitals:  Vitals Value Taken Time  BP    Temp    Pulse 119 06/21/19 1054  Resp 14 06/21/19 1054  SpO2 100 % 06/21/19 1054  Vitals shown include unvalidated device data.  Last Pain:  Vitals:   06/21/19 0918  TempSrc:   PainSc: 0-No pain         Complications: No apparent anesthesia complications

## 2019-07-09 ENCOUNTER — Telehealth: Payer: Self-pay

## 2019-07-14 ENCOUNTER — Other Ambulatory Visit: Payer: Self-pay

## 2019-07-14 ENCOUNTER — Other Ambulatory Visit
Admission: RE | Admit: 2019-07-14 | Discharge: 2019-07-14 | Disposition: A | Discharge status: Home / Self Care | Payer: Medicare Other | Source: Ambulatory Visit | Attending: Psychiatry | Admitting: Psychiatry

## 2019-07-14 DIAGNOSIS — Z01812 Encounter for preprocedural laboratory examination: Secondary | ICD-10-CM | POA: Insufficient documentation

## 2019-07-14 DIAGNOSIS — Z20828 Contact with and (suspected) exposure to other viral communicable diseases: Secondary | ICD-10-CM | POA: Diagnosis not present

## 2019-07-15 LAB — SARS CORONAVIRUS 2 (TAT 6-24 HRS): SARS Coronavirus 2: NEGATIVE

## 2019-07-19 ENCOUNTER — Other Ambulatory Visit: Payer: Self-pay

## 2019-07-19 ENCOUNTER — Other Ambulatory Visit: Payer: Self-pay | Admitting: Psychiatry

## 2019-07-19 ENCOUNTER — Ambulatory Visit: Payer: Self-pay | Admitting: Anesthesiology

## 2019-07-19 ENCOUNTER — Encounter
Admission: RE | Admit: 2019-07-19 | Discharge: 2019-07-19 | Disposition: A | Discharge status: Home / Self Care | Payer: Medicare Other | Source: Ambulatory Visit | Attending: Psychiatry | Admitting: Psychiatry

## 2019-07-19 ENCOUNTER — Encounter: Payer: Self-pay | Admitting: Anesthesiology

## 2019-07-19 DIAGNOSIS — Z886 Allergy status to analgesic agent status: Secondary | ICD-10-CM | POA: Insufficient documentation

## 2019-07-19 DIAGNOSIS — F332 Major depressive disorder, recurrent severe without psychotic features: Secondary | ICD-10-CM

## 2019-07-19 DIAGNOSIS — Z79899 Other long term (current) drug therapy: Secondary | ICD-10-CM | POA: Diagnosis not present

## 2019-07-19 DIAGNOSIS — F17210 Nicotine dependence, cigarettes, uncomplicated: Secondary | ICD-10-CM | POA: Insufficient documentation

## 2019-07-19 DIAGNOSIS — F259 Schizoaffective disorder, unspecified: Secondary | ICD-10-CM | POA: Insufficient documentation

## 2019-07-19 DIAGNOSIS — Z7984 Long term (current) use of oral hypoglycemic drugs: Secondary | ICD-10-CM | POA: Diagnosis not present

## 2019-07-19 MED ORDER — METHOHEXITAL SODIUM 100 MG/10ML IV SOSY
PREFILLED_SYRINGE | INTRAVENOUS | Status: DC | PRN
  Administered 2019-07-19: 70 mg via INTRAVENOUS

## 2019-07-19 MED ORDER — SODIUM CHLORIDE 0.9 % IV SOLN
INTRAVENOUS | Status: DC | PRN
  Administered 2019-07-19: 10:00:00 via INTRAVENOUS

## 2019-07-19 MED ORDER — GLYCOPYRROLATE 0.2 MG/ML IJ SOLN
0.1000 mg | Freq: Once | INTRAMUSCULAR | Status: AC
  Administered 2019-07-19: 0.1 mg via INTRAVENOUS

## 2019-07-19 MED ORDER — MIDAZOLAM HCL 2 MG/2ML IJ SOLN
INTRAMUSCULAR | Status: AC
  Filled 2019-07-19: qty 2

## 2019-07-19 MED ORDER — MIDAZOLAM HCL 2 MG/2ML IJ SOLN
1.0000 mg | Freq: Once | INTRAMUSCULAR | Status: AC
  Administered 2019-07-19: 1 mg via INTRAVENOUS

## 2019-07-19 MED ORDER — SUCCINYLCHOLINE CHLORIDE 20 MG/ML IJ SOLN
INTRAMUSCULAR | Status: DC | PRN
  Administered 2019-07-19: 100 mg via INTRAVENOUS

## 2019-07-19 MED ORDER — METHOHEXITAL SODIUM 0.5 G IJ SOLR
INTRAMUSCULAR | Status: AC
  Filled 2019-07-19: qty 500

## 2019-07-19 MED ORDER — GLYCOPYRROLATE 0.2 MG/ML IJ SOLN
INTRAMUSCULAR | Status: AC
  Filled 2019-07-19: qty 1

## 2019-07-19 MED ORDER — SUCCINYLCHOLINE CHLORIDE 20 MG/ML IJ SOLN
INTRAMUSCULAR | Status: AC
  Filled 2019-07-19: qty 1

## 2019-07-19 MED ORDER — FENTANYL CITRATE (PF) 100 MCG/2ML IJ SOLN: 25.0000 ug | INTRAMUSCULAR | Status: DC | PRN

## 2019-07-19 MED ORDER — SODIUM CHLORIDE 0.9 % IV SOLN
500.0000 mL | Freq: Once | INTRAVENOUS | Status: AC
  Administered 2019-07-19: 500 mL via INTRAVENOUS

## 2019-07-19 NOTE — Procedures (Signed)
ECT SERVICES Physician's Interval Evaluation & Treatment Note  Patient Identification: Jonathan Alexander. MRN:  [id number] Date of Evaluation:  07/19/2019 TX #: 43  MADRS:   MMSE:   P.E. Findings:  No change physical exam  Psychiatric Interval Note:  Mood good stable  Subjective:  Patient is a 33 y.o. male seen for evaluation for Electroconvulsive Therapy. No complaint  Treatment Summary:   []   Right Unilateral             [x]  Bilateral   % Energy : 1.0 ms 100%   Impedance: 1120 ohms  Seizure Energy Index: 13,197 V squared  Postictal Suppression Index: 94%  Seizure Concordance Index: 96%  Medications  Pre Shock: Robinul 0.2 mg Brevital 70 mg succinylcholine 100 mg  Post Shock: Versed 1 mg  Seizure Duration: 26 seconds EMG 29 seconds EEG   Comments: Follow-up 4 weeks  Lungs:  [x]   Clear to auscultation               []  Other:   Heart:    [x]   Regular rhythm             []  irregular rhythm    [x]   Previous H&P reviewed, patient examined and there are NO CHANGES                 []   Previous H&P reviewed, patient examined and there are changes noted.   Alethia Berthold, MD 12/14/202010:14 AM

## 2019-07-19 NOTE — Discharge Instructions (Signed)
1)  The drugs that you have been given will stay in your system until tomorrow so for the       next 24 hours you should not:  A. Drive an automobile  B. Make any legal decisions  C. Drink any alcoholic beverages  2)  You may resume your regular meals upon return home.  3)  A responsible adult must take you home.  Someone should stay with you for a few          hours, then be available by phone for the remainder of the treatment day.  4)  You May experience any of the following symptoms:  Headache, Nausea and a dry mouth (due to the medications you were given),  temporary memory loss and some confusion, or sore muscles (a warm bath  should help this).  If you you experience any of these symptoms let us know on                your return visit.  5)  Report any of the following: any acute discomfort, severe headache, or temperature        greater than 100.5 F.   Also report any unusual redness, swelling, drainage, or pain         at your IV site.    You may report Symptoms to:  [place] at [facility]          Phone: [phone], ECT Department           or Dr. Prescott Gum office [phone]  6)  Your next ECT Treatment is Monday January 11  We will call 2 days prior to your scheduled appointment for arrival times.  7)  Nothing to eat or drink after midnight the night before your procedure.  8)  Take   With a sip of water the morning of your procedure.  9)  Other Instructions: Call [phone] to cancel the morning of your procedure due         to illness or emergency.  10) We will call within 72 hours to assess how you are feeling.

## 2019-07-19 NOTE — H&P (Signed)
Jonathan Alexander. is an 33 y.o. male.   Chief Complaint: No new complaint.  Reports that his mood is feeling good HPI: History of recurrent schizoaffective disorder  Past Medical History:  Diagnosis Date  . Schizophrenia ([place])   . Tachycardia     History reviewed. No pertinent surgical history.  Family History  Family history unknown: Yes   Social History:  reports that he has been smoking cigarettes. He has a 4.00 pack-year smoking history. He has never used smokeless tobacco. He reports that he does not drink alcohol or use drugs.  Allergies:  Allergies  Allergen Reactions  . Divalproex Sodium   . Shellfish-Derived Products     (Not in a hospital admission)   No results found for this or any previous visit (from the past 48 hour(s)). No results found.  Review of Systems  Constitutional: Negative.   HENT: Negative.   Eyes: Negative.   Respiratory: Negative.   Cardiovascular: Negative.   Gastrointestinal: Negative.   Musculoskeletal: Negative.   Skin: Negative.   Neurological: Negative.   Psychiatric/Behavioral: Negative.     Blood pressure (!) 137/91, pulse 99, temperature 98.4 F (36.9 C), resp. rate 18, height 5\' 8"  (1.727 m), weight 114.8 kg, SpO2 100 %. Physical Exam  Nursing note and vitals reviewed. Constitutional: He appears well-developed and well-nourished.  HENT:  Head: Normocephalic and atraumatic.  Eyes: Pupils are equal, round, and reactive to light. Conjunctivae are normal.  Cardiovascular: Regular rhythm and normal heart sounds.  Respiratory: Effort normal.  GI: Soft.  Musculoskeletal:        General: Normal range of motion.     Cervical back: Normal range of motion.  Neurological: He is alert.  Skin: Skin is warm and dry.  Psychiatric: He has a normal mood and affect. His behavior is normal. Judgment and thought content normal.     Assessment/Plan ECT today follow-up in another 4 weeks or so  Alethia Berthold, MD 07/19/2019, 10:11  AM

## 2019-07-19 NOTE — Anesthesia Preprocedure Evaluation (Addendum)
Anesthesia Evaluation  Patient identified by MRN, date of birth, ID band Patient awake    Reviewed: Allergy & Precautions, H&P , NPO status , Patient's Chart, lab work & pertinent test results  Airway Mallampati: III  TM Distance: >3 FB    Comment: Large beard Dental  (+) Chipped   Pulmonary neg COPD, Current Smoker,           Cardiovascular hypertension, (-) angina(-) dysrhythmias      Neuro/Psych PSYCHIATRIC DISORDERS Bipolar Disorder Schizophrenia negative neurological ROS     GI/Hepatic negative GI ROS, Neg liver ROS,   Endo/Other  negative endocrine ROS  Renal/GU negative Renal ROS  negative genitourinary   Musculoskeletal   Abdominal   Peds  Hematology negative hematology ROS (+)   Anesthesia Other Findings Past Medical History: No date: Schizophrenia ([place]) No date: Tachycardia  No past surgical history on file.     Reproductive/Obstetrics negative OB ROS                            Anesthesia Physical Anesthesia Plan  ASA: II  Anesthesia Plan: General   Post-op Pain Management:    Induction:   PONV Risk Score and Plan:   Airway Management Planned: Mask  Additional Equipment:   Intra-op Plan:   Post-operative Plan:   Informed Consent: I have reviewed the patients History and Physical, chart, labs and discussed the procedure including the risks, benefits and alternatives for the proposed anesthesia with the patient or authorized representative who has indicated his/her understanding and acceptance.     Dental Advisory Given  Plan Discussed with: Anesthesiologist  Anesthesia Plan Comments:         Anesthesia Quick Evaluation

## 2019-07-19 NOTE — Anesthesia Post-op Follow-up Note (Signed)
Anesthesia QCDR form completed.        

## 2019-07-19 NOTE — Transfer of Care (Signed)
Immediate Anesthesia Transfer of Care Note  Patient: Heliodoro Domagalski.  Procedure(s) Performed: ECT TX  Patient Location: PACU  Anesthesia Type:General  Level of Consciousness: drowsy and patient cooperative  Airway & Oxygen Therapy: Patient Spontanous Breathing and Patient connected to face mask oxygen  Post-op Assessment: Report given to RN and Post -op Vital signs reviewed and stable  Post vital signs: Reviewed and stable  Last Vitals:  Vitals Value Taken Time  BP 125/82 07/19/19 1032  Temp 36.6 C 07/19/19 1032  Pulse 104 07/19/19 1037  Resp 23 07/19/19 1037  SpO2 100 % 07/19/19 1037  Vitals shown include unvalidated device data.  Last Pain:  Vitals:   07/19/19 1032  PainSc: Asleep         Complications: No apparent anesthesia complications

## 2019-07-20 NOTE — Anesthesia Postprocedure Evaluation (Signed)
Anesthesia Post Note  Patient: Jonathan Alexander.  Procedure(s) Performed: ECT TX  Patient location during evaluation: PACU Anesthesia Type: General Level of consciousness: awake and alert Pain management: pain level controlled Vital Signs Assessment: post-procedure vital signs reviewed and stable Respiratory status: spontaneous breathing, nonlabored ventilation and respiratory function stable Cardiovascular status: blood pressure returned to baseline and stable Postop Assessment: no apparent nausea or vomiting Anesthetic complications: no     Last Vitals:  Vitals:   07/19/19 1112 07/19/19 1140  BP:  128/82  Pulse: 93 94  Resp: 20 18  Temp:  36.6 C  SpO2: (!) 73%     Last Pain:  Vitals:   07/19/19 1140  TempSrc: Oral  PainSc: 0-No pain                 Durenda Hurt

## 2019-08-09 ENCOUNTER — Telehealth: Payer: Self-pay

## 2019-08-13 ENCOUNTER — Other Ambulatory Visit
Admission: RE | Admit: 2019-08-13 | Discharge: 2019-08-13 | Disposition: A | Discharge status: Home / Self Care | Payer: Medicare Other | Source: Ambulatory Visit | Attending: Psychiatry | Admitting: Psychiatry

## 2019-08-13 ENCOUNTER — Other Ambulatory Visit: Payer: Self-pay

## 2019-08-13 DIAGNOSIS — Z20822 Contact with and (suspected) exposure to covid-19: Secondary | ICD-10-CM | POA: Diagnosis not present

## 2019-08-13 DIAGNOSIS — Z01812 Encounter for preprocedural laboratory examination: Secondary | ICD-10-CM | POA: Diagnosis present

## 2019-08-14 LAB — SARS CORONAVIRUS 2 (TAT 6-24 HRS): SARS Coronavirus 2: NEGATIVE

## 2019-08-17 ENCOUNTER — Other Ambulatory Visit: Payer: Self-pay | Admitting: Psychiatry

## 2019-08-18 ENCOUNTER — Other Ambulatory Visit: Payer: Self-pay

## 2019-08-18 ENCOUNTER — Encounter
Admission: RE | Admit: 2019-08-18 | Discharge: 2019-08-18 | Disposition: A | Discharge status: Home / Self Care | Payer: Medicare Other | Source: Ambulatory Visit | Attending: Psychiatry | Admitting: Psychiatry

## 2019-08-18 ENCOUNTER — Encounter: Payer: Self-pay | Admitting: Anesthesiology

## 2019-08-18 DIAGNOSIS — I10 Essential (primary) hypertension: Secondary | ICD-10-CM | POA: Diagnosis not present

## 2019-08-18 DIAGNOSIS — F209 Schizophrenia, unspecified: Secondary | ICD-10-CM | POA: Diagnosis not present

## 2019-08-18 DIAGNOSIS — E669 Obesity, unspecified: Secondary | ICD-10-CM | POA: Insufficient documentation

## 2019-08-18 DIAGNOSIS — F17210 Nicotine dependence, cigarettes, uncomplicated: Secondary | ICD-10-CM | POA: Diagnosis not present

## 2019-08-18 DIAGNOSIS — E119 Type 2 diabetes mellitus without complications: Secondary | ICD-10-CM | POA: Insufficient documentation

## 2019-08-18 DIAGNOSIS — Z888 Allergy status to other drugs, medicaments and biological substances status: Secondary | ICD-10-CM | POA: Diagnosis not present

## 2019-08-18 DIAGNOSIS — Z79899 Other long term (current) drug therapy: Secondary | ICD-10-CM | POA: Diagnosis not present

## 2019-08-18 DIAGNOSIS — F250 Schizoaffective disorder, bipolar type: Secondary | ICD-10-CM

## 2019-08-18 DIAGNOSIS — F319 Bipolar disorder, unspecified: Secondary | ICD-10-CM | POA: Diagnosis present

## 2019-08-18 DIAGNOSIS — Z6831 Body mass index (BMI) 31.0-31.9, adult: Secondary | ICD-10-CM | POA: Insufficient documentation

## 2019-08-18 DIAGNOSIS — Z7984 Long term (current) use of oral hypoglycemic drugs: Secondary | ICD-10-CM | POA: Insufficient documentation

## 2019-08-18 MED ORDER — ONDANSETRON HCL 4 MG/2ML IJ SOLN: 4.0000 mg | Freq: Once | INTRAMUSCULAR | Status: DC | PRN

## 2019-08-18 MED ORDER — METHOHEXITAL SODIUM 100 MG/10ML IV SOSY
PREFILLED_SYRINGE | INTRAVENOUS | Status: DC | PRN
  Administered 2019-08-18: 70 mg via INTRAVENOUS

## 2019-08-18 MED ORDER — MIDAZOLAM HCL 2 MG/2ML IJ SOLN
1.0000 mg | Freq: Once | INTRAMUSCULAR | Status: AC
  Administered 2019-08-18 (×2): 1 mg via INTRAVENOUS

## 2019-08-18 MED ORDER — GLYCOPYRROLATE 0.2 MG/ML IJ SOLN: 0.2000 mg | Freq: Once | INTRAMUSCULAR | Status: AC

## 2019-08-18 MED ORDER — SUCCINYLCHOLINE CHLORIDE 20 MG/ML IJ SOLN
INTRAMUSCULAR | Status: DC | PRN
  Administered 2019-08-18: 100 mg via INTRAVENOUS

## 2019-08-18 MED ORDER — SODIUM CHLORIDE 0.9 % IV SOLN: INTRAVENOUS | Status: DC | PRN

## 2019-08-18 MED ORDER — MIDAZOLAM HCL 2 MG/2ML IJ SOLN
INTRAMUSCULAR | Status: AC
  Filled 2019-08-18: qty 2

## 2019-08-18 MED ORDER — SODIUM CHLORIDE 0.9 % IV SOLN
500.0000 mL | Freq: Once | INTRAVENOUS | Status: AC
  Administered 2019-08-18: 10:00:00 500 mL via INTRAVENOUS

## 2019-08-18 MED ORDER — GLYCOPYRROLATE 0.2 MG/ML IJ SOLN
INTRAMUSCULAR | Status: AC
  Administered 2019-08-18: 0.2 mg via INTRAVENOUS
  Filled 2019-08-18: qty 1

## 2019-08-18 NOTE — Anesthesia Preprocedure Evaluation (Signed)
Anesthesia Evaluation  Patient identified by MRN, date of birth, ID band Patient awake    Reviewed: Allergy & Precautions, NPO status , Patient's Chart, lab work & pertinent test results  History of Anesthesia Complications Negative for: history of anesthetic complications  Airway Mallampati: II  TM Distance: >3 FB Neck ROM: Full    Dental no notable dental hx. (+) Teeth Intact   Pulmonary neg pulmonary ROS, neg sleep apnea, neg COPD, Current Smoker and Patient abstained from smoking.,    Pulmonary exam normal breath sounds clear to auscultation       Cardiovascular Exercise Tolerance: Good METShypertension, (-) CAD and (-) Past MI (-) dysrhythmias  Rhythm:Regular Rate:Normal - Systolic murmurs    Neuro/Psych PSYCHIATRIC DISORDERS Bipolar Disorder Schizophrenia negative neurological ROS     GI/Hepatic neg GERD  ,(+)     (-) substance abuse  ,   Endo/Other  neg diabetes  Renal/GU negative Renal ROS     Musculoskeletal   Abdominal   Peds  Hematology   Anesthesia Other Findings Past Medical History: No date: Schizophrenia (HCC) No date: Tachycardia  Reproductive/Obstetrics                             Anesthesia Physical Anesthesia Plan  ASA: II  Anesthesia Plan: General   Post-op Pain Management:    Induction: Intravenous  PONV Risk Score and Plan: 1 and TIVA  Airway Management Planned: Mask  Additional Equipment: None  Intra-op Plan:   Post-operative Plan:   Informed Consent: I have reviewed the patients History and Physical, chart, labs and discussed the procedure including the risks, benefits and alternatives for the proposed anesthesia with the patient or authorized representative who has indicated his/her understanding and acceptance.     Dental advisory given  Plan Discussed with: CRNA and Surgeon  Anesthesia Plan Comments: (Discussed risks of anesthesia with  patient, including PONV, sore throat, lip/dental damage. Rare risks discussed as well, such as cardiorespiratory sequelae. Patient understands.)        Anesthesia Quick Evaluation

## 2019-08-18 NOTE — Transfer of Care (Signed)
Immediate Anesthesia Transfer of Care Note  Patient: Jonathan Alexander.  Procedure(s) Performed: ECT TX  Patient Location: PACU  Anesthesia Type:General  Level of Consciousness: sedated  Airway & Oxygen Therapy: Patient Spontanous Breathing and Patient connected to face mask oxygen  Post-op Assessment: Report given to RN and Post -op Vital signs reviewed and stable  Post vital signs: Reviewed and stable  Last Vitals:  Vitals Value Taken Time  BP 135/80 08/18/19 1128  Temp 36.2 C 08/18/19 1118  Pulse 103 08/18/19 1136  Resp 15 08/18/19 1136  SpO2 100 % 08/18/19 1136  Vitals shown include unvalidated device data.  Last Pain:  Vitals:   08/18/19 1118  TempSrc:   PainSc: 0-No pain         Complications: No apparent anesthesia complications

## 2019-08-18 NOTE — Anesthesia Procedure Notes (Signed)
Date/Time: 08/18/2019 10:44 AM Performed by: Junious Silk, CRNA Pre-anesthesia Checklist: Patient identified, Emergency Drugs available, Suction available, Patient being monitored and Timeout performed Oxygen Delivery Method: Simple face mask Ventilation: Mask ventilation throughout procedure and Mask ventilation without difficulty

## 2019-08-18 NOTE — Anesthesia Postprocedure Evaluation (Signed)
Anesthesia Post Note  Patient: Jonathan Alexander.  Procedure(s) Performed: ECT TX  Patient location during evaluation: Phase II Anesthesia Type: General Level of consciousness: awake and alert Pain management: pain level controlled Vital Signs Assessment: post-procedure vital signs reviewed and stable Respiratory status: spontaneous breathing, nonlabored ventilation, respiratory function stable and patient connected to nasal cannula oxygen Cardiovascular status: blood pressure returned to baseline and stable Postop Assessment: no apparent nausea or vomiting Anesthetic complications: no     Last Vitals:  Vitals:   08/18/19 1128 08/18/19 1153  BP: 135/80 130/79  Pulse: (!) 121 (!) 120  Resp: 18 18  Temp:  36.7 C  SpO2: 98%     Last Pain:  Vitals:   08/18/19 1153  TempSrc: Oral  PainSc: 0-No pain                 Corinda Gubler

## 2019-08-18 NOTE — Procedures (Signed)
ECT SERVICES Physician's Interval Evaluation & Treatment Note  Patient Identification: Jonathan Alexander. MRN:  [id number] Date of Evaluation:  08/18/2019 TX #: [id number]  MADRS:   MMSE:   P.E. Findings:  No change physical exam  Psychiatric Interval Note:  No complaint mood and behavior calm  Subjective:  Patient is a 34 y.o. male seen for evaluation for Electroconvulsive Therapy. Feeling well, no complaints  Treatment Summary:   []   Right Unilateral             [x]  Bilateral   % Energy : 1.0 ms 100%   Impedance: 1230 ohms  Seizure Energy Index: 12,711 V squared  Postictal Suppression Index: 90%  Seizure Concordance Index: 98%  Medications  Pre Shock: Robinul 0.2 mg Brevital 70 mg succinylcholine 100 mg  Post Shock: Versed 1 mg  Seizure Duration: 27 seconds EMG 28 seconds EEG   Comments: Follow-up 4 to 5 weeks  Lungs:  [x]   Clear to auscultation               []  Other:   Heart:    [x]   Regular rhythm             []  irregular rhythm    [x]   Previous H&P reviewed, patient examined and there are NO CHANGES                 []   Previous H&P reviewed, patient examined and there are changes noted.   , MD 1/13/202110:30 AM

## 2019-08-18 NOTE — H&P (Signed)
Jonathan Alexander. is an 34 y.o. male.   Chief Complaint: stable HPI: schizoaffective  Past Medical History:  Diagnosis Date  . Schizophrenia (HCC)   . Tachycardia     History reviewed. No pertinent surgical history.  Family History  Family history unknown: Yes   Social History:  reports that he has been smoking cigarettes. He has a 4.00 pack-year smoking history. He has never used smokeless tobacco. He reports that he does not drink alcohol or use drugs.  Allergies:  Allergies  Allergen Reactions  . Divalproex Sodium   . Shellfish-Derived Products     (Not in a hospital admission)   No results found for this or any previous visit (from the past 48 hour(s)). No results found.  Review of Systems  Constitutional: Negative.   HENT: Negative.   Eyes: Negative.   Respiratory: Negative.   Cardiovascular: Negative.   Gastrointestinal: Negative.   Musculoskeletal: Negative.   Skin: Negative.   Neurological: Negative.   Psychiatric/Behavioral: Negative.     Blood pressure 135/86, pulse (!) 114, temperature 98.2 F (36.8 C), temperature source Oral, resp. rate 18, height 6\' 2"  (1.88 m), weight 113.4 kg, SpO2 98 %. Physical Exam  Nursing note and vitals reviewed. Constitutional: He appears well-developed and well-nourished.  HENT:  Head: Normocephalic and atraumatic.  Eyes: Pupils are equal, round, and reactive to light. Conjunctivae are normal.  Cardiovascular: Regular rhythm and normal heart sounds.  Respiratory: Effort normal.  GI: Soft.  Musculoskeletal:        General: Normal range of motion.     Cervical back: Normal range of motion.  Neurological: He is alert.  Skin: Skin is warm and dry.  Psychiatric: He has a normal mood and affect. His behavior is normal. Judgment and thought content normal.     Assessment/Plan Continue q4-5  , MD 08/18/2019, 10:29 AM

## 2019-08-18 NOTE — Discharge Instructions (Signed)
1)  The drugs that you have been given will stay in your system until tomorrow so for the       next 24 hours you should not:  A. Drive an automobile  B. Make any legal decisions  C. Drink any alcoholic beverages  2)  You may resume your regular meals upon return home.  3)  A responsible adult must take you home.  Someone should stay with you for a few          hours, then be available by phone for the remainder of the treatment day.  4)  You May experience any of the following symptoms:  Headache, Nausea and a dry mouth (due to the medications you were given),  temporary memory loss and some confusion, or sore muscles (a warm bath  should help this).  If you you experience any of these symptoms let us know on                your return visit.  5)  Report any of the following: any acute discomfort, severe headache, or temperature        greater than 100.5 F.   Also report any unusual redness, swelling, drainage, or pain         at your IV site.    You may report Symptoms to:  ECT PROGRAM- [place] [facility]          Phone: [phone], ECT Department           or Dr. Shary Key office [phone]  6)  Your next ECT Treatment is Wednesday February 17 at 8:30   We will call 2 days prior to your scheduled appointment for arrival times.  7)  Nothing to eat or drink after midnight the night before your procedure.  8)  Take morning meds with a sip of water the morning of your procedure.  9)  Other Instructions: Call [phone] to cancel the morning of your procedure due         to illness or emergency.  10) We will call within 72 hours to assess how you are feeling.

## 2019-09-12 ENCOUNTER — Emergency Department
Admission: EM | Admit: 2019-09-12 | Discharge: 2019-09-12 | Disposition: A | Discharge status: Home / Self Care | Payer: Medicare Other | Attending: Emergency Medicine | Admitting: Emergency Medicine

## 2019-09-12 ENCOUNTER — Emergency Department: Payer: Medicare Other

## 2019-09-12 ENCOUNTER — Other Ambulatory Visit: Payer: Self-pay

## 2019-09-12 DIAGNOSIS — R1010 Upper abdominal pain, unspecified: Secondary | ICD-10-CM | POA: Diagnosis present

## 2019-09-12 DIAGNOSIS — F17210 Nicotine dependence, cigarettes, uncomplicated: Secondary | ICD-10-CM | POA: Diagnosis not present

## 2019-09-12 DIAGNOSIS — Z79899 Other long term (current) drug therapy: Secondary | ICD-10-CM | POA: Diagnosis not present

## 2019-09-12 DIAGNOSIS — R1084 Generalized abdominal pain: Secondary | ICD-10-CM | POA: Diagnosis not present

## 2019-09-12 DIAGNOSIS — R112 Nausea with vomiting, unspecified: Secondary | ICD-10-CM | POA: Diagnosis not present

## 2019-09-12 MED ORDER — ONDANSETRON 4 MG PO TBDP: ORAL_TABLET | ORAL | 0 refills | Status: DC

## 2019-09-12 MED ORDER — DROPERIDOL 2.5 MG/ML IJ SOLN
2.5000 mg | Freq: Once | INTRAMUSCULAR | Status: AC
  Administered 2019-09-12: 2.5 mg via INTRAMUSCULAR
  Filled 2019-09-12: qty 2

## 2019-09-12 MED ORDER — ONDANSETRON 4 MG PO TBDP
4.0000 mg | ORAL_TABLET | Freq: Once | ORAL | Status: AC
  Administered 2019-09-12: 4 mg via ORAL
  Filled 2019-09-12: qty 1

## 2019-09-12 MED ORDER — ALUM & MAG HYDROXIDE-SIMETH 200-200-20 MG/5ML PO SUSP
30.0000 mL | Freq: Once | ORAL | Status: AC
  Administered 2019-09-12: 30 mL via ORAL
  Filled 2019-09-12: qty 30

## 2019-09-12 NOTE — Discharge Instructions (Signed)
As we discussed, we believe you are having some vomiting and abdominal pain because of eating too much last night.  Please try to eat only a little bit of bland food today to let your stomach settle, or stick with fluids.  We prescribed some nausea medicine that you can use as needed.  Please follow-up with your regular doctor.

## 2019-09-12 NOTE — ED Notes (Signed)
Given water and able to tolerate

## 2019-09-12 NOTE — ED Provider Notes (Signed)
-----------------------------------------   10:48 AM on 09/12/2019 -----------------------------------------  On reassessment, the patient states that the pain has resolved, although he is scared that it will come back.  The patient has no abdominal tenderness on palpation.  He has had no further vomiting in the ED after being observed for more than 4 hours, and is tolerating p.o.  At this time, he is stable for discharge.  Return precautions provided.   Dionne Bucy, MD 09/12/19 1048

## 2019-09-12 NOTE — ED Triage Notes (Signed)
Patient coming [facility] from [facility]. Patient reports heavy meal earlier this evening including [employer], pizza, chinese and trail mix. Patient reports 4 emeses. Patient reports abdominal pain prior to emesis, currently denies.

## 2019-09-12 NOTE — ED Provider Notes (Signed)
[facility] Emergency Department Provider Note  ____________________________________________   First MD Initiated Contact with Patient 09/12/19 0559     (approximate)  I have reviewed the triage vital signs and the nursing notes.   HISTORY  Chief Complaint Emesis  History may be limited by the patient's chronic mental illness.  HPI Jonathan Alexander. is a 34 y.o. male with history of schizophrenia but he was otherwise generally healthy and presents by EMS tonight for evaluation of abdominal pain and nausea with several episodes of vomiting.  Reportedly he had a very large and heavy meal earlier this evening that included food from [employer], pizza, [place] food, and trail mix.  He subsequently developed some upper abdominal pain that he reports was severe, nausea, and then multiple episodes of vomiting.  EMS was called to bring him to the emergency department.  He arrives ambulating without any difficulty with the EMS provider to the ED exam room.  The patient reports that his stomach did hurt but it feels better now.  He denies any recent fever/chills, sore throat, chest pain, shortness of breath, nor cough.  He is still nauseated.  He reports that the symptoms were severe and nothing in particular made them worse and they felt better after vomiting.        Past Medical History:  Diagnosis Date  . Schizophrenia ([place])   . Tachycardia     Patient Active Problem List   Diagnosis Date Noted  . Schizophrenia ([place]) 01/13/2019  . Schizoaffective disorder, bipolar type ([place]) 02/09/2018  . Hypertension 02/09/2018    History reviewed. No pertinent surgical history.  Prior to Admission medications   Medication Sig Start Date End Date Taking? Authorizing Provider  albuterol (VENTOLIN HFA) 108 (90 Base) MCG/ACT inhaler Inhale 2 puffs into the lungs every 6 (six) hours as needed for wheezing or shortness of breath. 05/17/19   Harvest Dark, MD  clonazePAM  (KLONOPIN) 1 MG tablet Take 1 tablet (1 mg total) by mouth 3 (three) times daily. 01/26/19   Clapacs, Madie Reno, MD  cloZAPine (CLOZARIL) 100 MG tablet TAKE 3 AND 1/2 TABLETS (350MG ) BY MOUTH AT BEDTIME 03/23/19   Clapacs, Madie Reno, MD  desmopressin (DDAVP) 0.2 MG tablet Take 1 tablet (0.2 mg total) by mouth at bedtime. 01/26/19   Clapacs, Madie Reno, MD  EPINEPHrine (EPIPEN 2-PAK) 0.3 mg/0.3 mL IJ SOAJ injection Inject 0.3 mg into the muscle as directed.    [provider]  ferrous sulfate 325 (65 FE) MG tablet Take 1 tablet (325 mg total) by mouth daily with breakfast. 01/26/19   Clapacs, Madie Reno, MD  imipramine (TOFRANIL) 50 MG tablet Take 2 tablets (100 mg total) by mouth at bedtime. 01/26/19   Clapacs, Madie Reno, MD  lithium carbonate (ESKALITH) 450 MG CR tablet Take 2 tablets (900 mg total) by mouth at bedtime. 01/26/19   Clapacs, Madie Reno, MD  loratadine (CLARITIN) 10 MG tablet Take 1 tablet (10 mg total) by mouth daily. 01/26/19   Clapacs, Madie Reno, MD  metFORMIN (GLUCOPHAGE) 500 MG tablet Take 1 tablet (500 mg total) by mouth 2 (two) times daily with a meal. 01/26/19   Clapacs, Madie Reno, MD  metoprolol succinate (TOPROL-XL) 100 MG 24 hr tablet Take 1 tablet (100 mg total) by mouth daily. 01/27/19   Clapacs, Madie Reno, MD  OLANZapine (ZYPREXA) 20 MG tablet Take 1 tablet (20 mg total) by mouth at bedtime. 01/26/19   Clapacs, Madie Reno, MD  ondansetron (ZOFRAN ODT)  4 MG disintegrating tablet Allow 1-2 tablets to dissolve in your mouth every 8 hours as needed for nausea/vomiting 09/12/19   Loleta Rose, MD  polyethylene glycol (MIRALAX / GLYCOLAX) 17 g packet Take 17 g by mouth every morning. 01/26/19   Clapacs, Jackquline Denmark, MD  venlafaxine XR (EFFEXOR-XR) 75 MG 24 hr capsule Take 1 capsule (75 mg total) by mouth daily with breakfast. 01/27/19   Clapacs, Jackquline Denmark, MD    Allergies Divalproex sodium and Shellfish-derived products  Family History  Family history unknown: Yes    Social History Social History   Tobacco Use    . Smoking status: Current Every Day Smoker    Packs/day: 1.00    Years: 4.00    Pack years: 4.00    Types: Cigarettes  . Smokeless tobacco: Never Used  Substance Use Topics  . Alcohol use: No  . Drug use: No    Review of Systems Constitutional: No fever/chills Eyes: No visual changes. ENT: No sore throat. Cardiovascular: Denies chest pain. Respiratory: Denies shortness of breath. Gastrointestinal: Abdominal pain after large meal, nausea and several episodes of vomiting. Genitourinary: Negative for dysuria. Musculoskeletal: Negative for neck pain.  Negative for back pain. Integumentary: Negative for rash. Neurological: Negative for headaches, focal weakness or numbness.   ____________________________________________   PHYSICAL EXAM:  VITAL SIGNS: ED Triage Vitals  Enc Vitals Group     BP 09/12/19 0559 123/67     Pulse Rate 09/12/19 0559 (!) 103     Resp 09/12/19 0559 17     Temp 09/12/19 0559 98.4 F (36.9 C)     Temp src --      SpO2 09/12/19 0559 99 %     Weight 09/12/19 0557 113.4 kg (250 lb)     Height 09/12/19 0557 1.854 m (6\' 1" )     Head Circumference --      Peak Flow --      Pain Score 09/12/19 0557 0     Pain Loc --      Pain Edu? --      Excl. in GC? --     Constitutional: Alert and oriented.  No acute distress at this time. Eyes: Conjunctivae are normal.  Head: Atraumatic. Nose: No congestion/rhinnorhea. Mouth/Throat: Patient is wearing a mask. Neck: No stridor.  No meningeal signs.   Cardiovascular: Borderline tachycardic initially, regular rhythm. Good peripheral circulation. Grossly normal heart sounds. Respiratory: Normal respiratory effort.  No retractions. Gastrointestinal: Soft and mildly distended.  No tenderness to palpation of the abdomen. Musculoskeletal: No lower extremity tenderness nor edema. No gross deformities of extremities. Neurologic:  Normal speech and language. No gross focal neurologic deficits are appreciated.  Skin:   Skin is warm, dry and intact. Psychiatric: Mood and affect are flat and guarded but otherwise normal.  ____________________________________________   LABS (all labs ordered are listed, but only abnormal results are displayed)  Labs Reviewed - No data to display ____________________________________________  EKG  No indication for emergent EKG.  Prior EKG demonstrates no QTC prolongation. ____________________________________________  RADIOLOGY 11/10/19, personally viewed and evaluated these images (plain radiographs) as part of my medical decision making, as well as reviewing the written report by the radiologist.  ED MD interpretation: No acute abnormalities identified on acute abdomen series with a nonobstructive bowel gas pattern.  Official radiology report(s): DG Abdomen Acute W/Chest  Result Date: 09/12/2019 CLINICAL DATA:  Acute vomiting. EXAM: DG ABDOMEN ACUTE W/ 1V CHEST COMPARISON:  05/17/2019 chest radiograph FINDINGS: Cardiomediastinal silhouette  is unremarkable. Elevation of the RIGHT hemidiaphragm is again noted. The lungs are clear. Few scattered nondistended gas and fluid-filled loops of small bowel are noted. No dilated bowel loops are present. A small to moderate amount of stool within the colon is noted. There is no evidence of pneumoperitoneum or suspicious calcifications. No acute bony abnormalities are identified. IMPRESSION: 1. Nonspecific nonobstructive bowel gas pattern. No evidence of pneumoperitoneum. 2. No evidence of acute cardiopulmonary disease. Electronically Signed   By: Harmon Pier M.D.   On: 09/12/2019 06:38    ____________________________________________   PROCEDURES   Procedure(s) performed (including Critical Care):  Procedures   ____________________________________________   INITIAL IMPRESSION / MDM / ASSESSMENT AND PLAN / ED COURSE  As part of my medical decision making, I reviewed the following data within the electronic medical  record:  Nursing notes reviewed and incorporated, Old EKG reviewed, Old chart reviewed, Patient signed out to , Notes from prior ED visits and [place] Controlled Substance Database   Differential diagnosis includes, but is not limited to, gastritis secondary to overeating, acute intra-abdominal infection such as appendicitis, biliary colic, SBO/ileus.  The patient is well-appearing at this time, ambulatory, and has no tenderness to palpation of the abdomen.  I ordered an oral Zofran and a dose of Maalox.  He is in no distress and I do not think he would benefit from lab work at this time.  I will check his acute abdomen series and then reassess after the medications.      Clinical Course as of Sep 11 733  Wynelle Link Sep 12, 2019  0641 Nonobstructive bowel pattern  DG Abdomen Acute W/Chest [CF]  7846 The patient tolerated an oral Zofran and a dose of Maalox, but he is increasingly agitated and concerned.  He said that he has nauseated again and he is scared because of his symptoms.  He said that his stomach is hurting worse, but when I reexamined him, he said that the palpation of his abdomen made him feel better.  I provided reassurance but he is concerned about the persistent feelings.  I think he would benefit from a small dose of droperidol 2.5 mg intramuscular to assist both with the persistent nausea as well as to act as a calming agent.  There still is no indication for lab work at this time.  I verify that he does not have a prolonged QTC on his old EKG.  I explained to him that he needs some time as well as some medicine to help settle his stomach and then he can go home and follow-up.  I am transferring ED care to Dr. Marisa Severin to reassess him after the medication has had time to work.   [CF]    Clinical Course User Index [CF] Loleta Rose, MD     ____________________________________________  FINAL CLINICAL IMPRESSION(S) / ED DIAGNOSES  Final diagnoses:  Non-intractable vomiting with nausea,  unspecified vomiting type  Generalized abdominal pain     MEDICATIONS GIVEN DURING THIS VISIT:  Medications  droperidol (INAPSINE) 2.5 MG/ML injection 2.5 mg (has no administration in time range)  ondansetron (ZOFRAN-ODT) disintegrating tablet 4 mg (4 mg Oral Given 09/12/19 0624)  alum & mag hydroxide-simeth (MAALOX/MYLANTA) 200-200-20 MG/5ML suspension 30 mL (30 mLs Oral Given 09/12/19 0656)     ED Discharge Orders         Ordered    ondansetron (ZOFRAN ODT) 4 MG disintegrating tablet     09/12/19 0724          *  Please note:  Jonathan Alexander. was evaluated in Emergency Department on 09/12/2019 for the symptoms described in the history of present illness. He was evaluated in the context of the global COVID-19 pandemic, which necessitated consideration that the patient might be at risk for infection with the SARS-CoV-2 virus that causes COVID-19. Institutional protocols and algorithms that pertain to the evaluation of patients at risk for COVID-19 are in a state of rapid change based on information released by regulatory bodies including the CDC and federal and state organizations. These policies and algorithms were followed during the patient's care in the ED.  Some ED evaluations and interventions may be delayed as a result of limited staffing during the pandemic.*  Note:  This document was prepared using Dragon voice recognition software and may include unintentional dictation errors.   Loleta Rose, MD 09/12/19 [phone]

## 2019-09-17 ENCOUNTER — Telehealth: Payer: Self-pay

## 2019-09-20 ENCOUNTER — Telehealth: Payer: Self-pay

## 2019-09-21 ENCOUNTER — Other Ambulatory Visit
Admission: RE | Admit: 2019-09-21 | Discharge: 2019-09-21 | Disposition: A | Discharge status: Home / Self Care | Payer: Medicare Other | Source: Ambulatory Visit | Attending: Psychiatry | Admitting: Psychiatry

## 2019-09-21 ENCOUNTER — Other Ambulatory Visit: Payer: Self-pay | Admitting: Psychiatry

## 2019-09-21 ENCOUNTER — Other Ambulatory Visit: Payer: Self-pay

## 2019-09-21 DIAGNOSIS — Z01812 Encounter for preprocedural laboratory examination: Secondary | ICD-10-CM | POA: Diagnosis present

## 2019-09-21 DIAGNOSIS — Z20822 Contact with and (suspected) exposure to covid-19: Secondary | ICD-10-CM | POA: Insufficient documentation

## 2019-09-22 ENCOUNTER — Encounter: Payer: Self-pay | Admitting: Registered Nurse

## 2019-09-22 ENCOUNTER — Encounter
Admission: RE | Admit: 2019-09-22 | Discharge: 2019-09-22 | Disposition: A | Discharge status: Home / Self Care | Payer: Medicare Other | Source: Ambulatory Visit | Attending: Psychiatry | Admitting: Psychiatry

## 2019-09-22 ENCOUNTER — Ambulatory Visit: Payer: Self-pay | Admitting: Registered Nurse

## 2019-09-22 DIAGNOSIS — F259 Schizoaffective disorder, unspecified: Secondary | ICD-10-CM | POA: Insufficient documentation

## 2019-09-22 DIAGNOSIS — F250 Schizoaffective disorder, bipolar type: Secondary | ICD-10-CM

## 2019-09-22 DIAGNOSIS — F17210 Nicotine dependence, cigarettes, uncomplicated: Secondary | ICD-10-CM | POA: Diagnosis not present

## 2019-09-22 LAB — SARS CORONAVIRUS 2 (TAT 6-24 HRS): SARS Coronavirus 2: NEGATIVE

## 2019-09-22 MED ORDER — MIDAZOLAM HCL 2 MG/2ML IJ SOLN
INTRAMUSCULAR | Status: DC | PRN
  Administered 2019-09-22: 1 mg via INTRAVENOUS

## 2019-09-22 MED ORDER — METHOHEXITAL SODIUM 100 MG/10ML IV SOSY
PREFILLED_SYRINGE | INTRAVENOUS | Status: DC | PRN
  Administered 2019-09-22: 70 mg via INTRAVENOUS

## 2019-09-22 MED ORDER — GLYCOPYRROLATE 0.2 MG/ML IJ SOLN
INTRAMUSCULAR | Status: AC
  Filled 2019-09-22: qty 1

## 2019-09-22 MED ORDER — SUCCINYLCHOLINE CHLORIDE 20 MG/ML IJ SOLN
INTRAMUSCULAR | Status: DC | PRN
  Administered 2019-09-22: 80 mg via INTRAVENOUS

## 2019-09-22 MED ORDER — MIDAZOLAM HCL 2 MG/2ML IJ SOLN
INTRAMUSCULAR | Status: AC
  Filled 2019-09-22: qty 2

## 2019-09-22 MED ORDER — METHOHEXITAL SODIUM 0.5 G IJ SOLR
INTRAMUSCULAR | Status: AC
  Filled 2019-09-22: qty 500

## 2019-09-22 MED ORDER — SODIUM CHLORIDE 0.9 % IV SOLN
500.0000 mL | Freq: Once | INTRAVENOUS | Status: AC
  Administered 2019-09-22: 500 mL via INTRAVENOUS

## 2019-09-22 MED ORDER — GLYCOPYRROLATE 0.2 MG/ML IJ SOLN
0.2000 mg | Freq: Once | INTRAMUSCULAR | Status: AC
  Administered 2019-09-22: 0.2 mg via INTRAVENOUS

## 2019-09-22 MED ORDER — SODIUM CHLORIDE 0.9 % IV SOLN: INTRAVENOUS | Status: DC | PRN

## 2019-09-22 MED ORDER — MIDAZOLAM HCL 2 MG/2ML IJ SOLN: 1.0000 mg | Freq: Once | INTRAMUSCULAR | Status: DC

## 2019-09-22 NOTE — Transfer of Care (Signed)
Immediate Anesthesia Transfer of Care Note  Patient: Jonathan Alexander.  Procedure(s) Performed: ECT TX  Patient Location: PACU  Anesthesia Type:General  Level of Consciousness: drowsy  Airway & Oxygen Therapy: Patient Spontanous Breathing and Patient connected to face mask oxygen  Post-op Assessment: Report given to RN and Post -op Vital signs reviewed and stable  Post vital signs: Reviewed and stable  Last Vitals:  Vitals Value Taken Time  BP 130/81 09/22/19 1049  Temp 37.7 C 09/22/19 1049  Pulse 117 09/22/19 1049  Resp 20 09/22/19 1049  SpO2 100 % 09/22/19 1049  Vitals shown include unvalidated device data.  Last Pain:  Vitals:   09/22/19 1111  TempSrc:   PainSc: 0-No pain         Complications: No apparent anesthesia complications

## 2019-09-22 NOTE — Anesthesia Preprocedure Evaluation (Addendum)
Anesthesia Evaluation  Patient identified by MRN, date of birth, ID band Patient awake    Reviewed: Allergy & Precautions, H&P , NPO status , Patient's Chart, lab work & pertinent test results  Airway Mallampati: II  TM Distance: >3 FB Neck ROM: full    Dental  (+) Chipped   Pulmonary Current Smoker,           Cardiovascular hypertension,      Neuro/Psych PSYCHIATRIC DISORDERS Bipolar Disorder Schizophrenia negative neurological ROS     GI/Hepatic negative GI ROS, Neg liver ROS,   Endo/Other  negative endocrine ROS  Renal/GU negative Renal ROS  negative genitourinary   Musculoskeletal   Abdominal   Peds  Hematology negative hematology ROS (+)   Anesthesia Other Findings Past Medical History: No date: Schizophrenia (HCC) No date: Tachycardia  History reviewed. No pertinent surgical history.     Reproductive/Obstetrics negative OB ROS                             Anesthesia Physical  Anesthesia Plan  ASA: II  Anesthesia Plan: General   Post-op Pain Management:    Induction:   PONV Risk Score and Plan:   Airway Management Planned: Mask  Additional Equipment:   Intra-op Plan:   Post-operative Plan:   Informed Consent: I have reviewed the patients History and Physical, chart, labs and discussed the procedure including the risks, benefits and alternatives for the proposed anesthesia with the patient or authorized representative who has indicated his/her understanding and acceptance.     Dental Advisory Given  Plan Discussed with: Anesthesiologist  Anesthesia Plan Comments:         Anesthesia Quick Evaluation  

## 2019-09-22 NOTE — Discharge Instructions (Signed)
1)  The drugs that you have been given will stay in your system until tomorrow so for the       next 24 hours you should not:  A. Drive an automobile  B. Make any legal decisions  C. Drink any alcoholic beverages  2)  You may resume your regular meals upon return home.  3)  A responsible adult must take you home.  Someone should stay with you for a few          hours, then be available by phone for the remainder of the treatment day.  4)  You May experience any of the following symptoms:  Headache, Nausea and a dry mouth (due to the medications you were given),  temporary memory loss and some confusion, or sore muscles (a warm bath  should help this).  If you you experience any of these symptoms let us know on                your return visit.  5)  Report any of the following: any acute discomfort, severe headache, or temperature        greater than 100.5 F.   Also report any unusual redness, swelling, drainage, or pain         at your IV site.    You may report Symptoms to:  ECT PROGRAM- [place] at [facility]          Phone: [phone], ECT Department           or Dr. Shary Key office [phone]  6)  Your next ECT Treatment is Wednesday March 24  We will call 2 days prior to your scheduled appointment for arrival times.  7)  Nothing to eat or drink after midnight the night before your procedure.  8)  Take morning meds with a sip of water the morning of your procedure.  9)  Other Instructions: Call [phone] to cancel the morning of your procedure due         to illness or emergency.  10) We will call within 72 hours to assess how you are feeling.

## 2019-09-22 NOTE — H&P (Signed)
Jonathan Alexander. is an 34 y.o. male.   Chief Complaint: No specific complaints HPI: Schizoaffective disorder  Past Medical History:  Diagnosis Date  . Schizophrenia (HCC)   . Tachycardia     History reviewed. No pertinent surgical history.  Family History  Family history unknown: Yes   Social History:  reports that he has been smoking cigarettes. He has a 4.00 pack-year smoking history. He has never used smokeless tobacco. He reports that he does not drink alcohol or use drugs.  Allergies:  Allergies  Allergen Reactions  . Divalproex Sodium   . Shellfish-Derived Products     (Not in a hospital admission)   Results for orders placed or performed during the hospital encounter of 09/21/19 (from the past 48 hour(s))  SARS CORONAVIRUS 2 (TAT 6-24 HRS) Nasopharyngeal Nasopharyngeal Swab     Status: None   Collection Time: 09/21/19  2:01 PM   Specimen: Nasopharyngeal Swab  Result Value Ref Range   SARS Coronavirus 2 NEGATIVE NEGATIVE    Comment: (NOTE) SARS-CoV-2 target nucleic acids are NOT DETECTED. The SARS-CoV-2 RNA is generally detectable in upper and lower respiratory specimens during the acute phase of infection. Negative results do not preclude SARS-CoV-2 infection, do not rule out co-infections with other pathogens, and should not be used as the sole basis for treatment or other patient management decisions. Negative results must be combined with clinical observations, patient history, and epidemiological information. The expected result is Negative. Fact Sheet for Patients: HairSlick.no Fact Sheet for Healthcare Providers: quierodirigir.com This test is not yet approved or cleared by the [place] FDA and  has been authorized for detection and/or diagnosis of SARS-CoV-2 by FDA under an Emergency Use Authorization (EUA). This EUA will remain  in effect (meaning this test can be used) for the duration of  the COVID-19 declaration under Section 56 4(b)(1) of the Act, 21 U.S.C. section 360bbb-3(b)(1), unless the authorization is terminated or revoked sooner. Performed at [facility], [address], [place], [place] [address]    No results found.  Review of Systems  Constitutional: Negative.   HENT: Negative.   Eyes: Negative.   Respiratory: Negative.   Cardiovascular: Negative.   Gastrointestinal: Negative.   Musculoskeletal: Negative.   Skin: Negative.   Neurological: Negative.   Psychiatric/Behavioral: Negative.     Blood pressure 134/87, pulse (!) 106, temperature 98.4 F (36.9 C), temperature source Oral, resp. rate 18, SpO2 99 %. Physical Exam  Nursing note and vitals reviewed. Constitutional: He appears well-developed and well-nourished.  HENT:  Head: Normocephalic and atraumatic.  Eyes: Pupils are equal, round, and reactive to light. Conjunctivae are normal.  Cardiovascular: Normal heart sounds.  Respiratory: Effort normal.  GI: Soft.  Musculoskeletal:        General: Normal range of motion.     Cervical back: Normal range of motion.  Neurological: He is alert.  Skin: Skin is warm and dry.  Psychiatric: He has a normal mood and affect. His behavior is normal. Judgment and thought content normal.     Assessment/Plan Follow-up in 6 weeks or 5 weeks  [name], MD 09/22/2019, 10:41 AM

## 2019-09-22 NOTE — Procedures (Signed)
ECT SERVICES Physician's Interval Evaluation & Treatment Note  Patient Identification: Jonathan Alexander. MRN:  [id number] Date of Evaluation:  09/22/2019 TX #: 45  MADRS:   MMSE:   P.E. Findings:  No change to physical exam  Psychiatric Interval Note:  Stable no acute mood episode  Subjective:  Patient is a 34 y.o. male seen for evaluation for Electroconvulsive Therapy. No complaints  Treatment Summary:   []   Right Unilateral             [x]  Bilateral   % Energy : 1.0 ms 100%   Impedance: 1100 ohms  Seizure Energy Index: 16,972 V squared  Postictal Suppression Index: 87%  Seizure Concordance Index: 96%  Medications  Pre Shock: Robinul 0.2 mg Brevital 70 mg succinylcholine 100 mg  Post Shock: Versed 1 mg  Seizure Duration: 26 seconds EMG 29 seconds EEG   Comments: Follow-up 5 weeks  Lungs:  [x]   Clear to auscultation               []  Other:   Heart:    [x]   Regular rhythm             []  irregular rhythm    [x]   Previous H&P reviewed, patient examined and there are NO CHANGES                 []   Previous H&P reviewed, patient examined and there are changes noted.   , MD 2/17/202110:42 AM

## 2019-09-23 NOTE — Anesthesia Postprocedure Evaluation (Signed)
Anesthesia Post Note  Patient: Jonathan Alexander.  Procedure(s) Performed: ECT TX  Patient location during evaluation: PACU Anesthesia Type: General Level of consciousness: awake and alert Pain management: pain level controlled Vital Signs Assessment: post-procedure vital signs reviewed and stable Respiratory status: spontaneous breathing, nonlabored ventilation and respiratory function stable Cardiovascular status: blood pressure returned to baseline and stable Postop Assessment: no apparent nausea or vomiting Anesthetic complications: no     Last Vitals:  Vitals:   09/22/19 1111 09/22/19 1121  BP: 120/82   Pulse: (!) 115 (!) 106  Resp: 15 18  Temp: 37.7 C 37.1 C  SpO2: 99%     Last Pain:  Vitals:   09/22/19 1121  TempSrc: Oral  PainSc: 0-No pain                 Aurelio Brash Gotham Raden

## 2019-10-25 ENCOUNTER — Other Ambulatory Visit
Admission: RE | Admit: 2019-10-25 | Discharge: 2019-10-25 | Disposition: A | Discharge status: Home / Self Care | Payer: Medicare Other | Source: Ambulatory Visit | Attending: Psychiatry | Admitting: Psychiatry

## 2019-10-25 ENCOUNTER — Other Ambulatory Visit: Payer: Self-pay

## 2019-10-25 DIAGNOSIS — Z01812 Encounter for preprocedural laboratory examination: Secondary | ICD-10-CM | POA: Diagnosis present

## 2019-10-25 DIAGNOSIS — Z20822 Contact with and (suspected) exposure to covid-19: Secondary | ICD-10-CM | POA: Insufficient documentation

## 2019-10-26 ENCOUNTER — Other Ambulatory Visit: Payer: Self-pay | Admitting: Psychiatry

## 2019-10-26 LAB — SARS CORONAVIRUS 2 (TAT 6-24 HRS): SARS Coronavirus 2: NEGATIVE

## 2019-10-27 ENCOUNTER — Encounter: Payer: Self-pay | Admitting: Certified Registered Nurse Anesthetist

## 2019-10-27 ENCOUNTER — Other Ambulatory Visit: Payer: Self-pay

## 2019-10-27 ENCOUNTER — Encounter
Admission: RE | Admit: 2019-10-27 | Discharge: 2019-10-27 | Disposition: A | Discharge status: Home / Self Care | Payer: Medicare Other | Source: Ambulatory Visit | Attending: Psychiatry | Admitting: Psychiatry

## 2019-10-27 DIAGNOSIS — Z79899 Other long term (current) drug therapy: Secondary | ICD-10-CM | POA: Diagnosis not present

## 2019-10-27 DIAGNOSIS — I10 Essential (primary) hypertension: Secondary | ICD-10-CM | POA: Diagnosis not present

## 2019-10-27 DIAGNOSIS — F250 Schizoaffective disorder, bipolar type: Secondary | ICD-10-CM | POA: Diagnosis not present

## 2019-10-27 DIAGNOSIS — R000 Tachycardia, unspecified: Secondary | ICD-10-CM | POA: Diagnosis not present

## 2019-10-27 DIAGNOSIS — F17210 Nicotine dependence, cigarettes, uncomplicated: Secondary | ICD-10-CM | POA: Insufficient documentation

## 2019-10-27 DIAGNOSIS — F259 Schizoaffective disorder, unspecified: Secondary | ICD-10-CM | POA: Diagnosis present

## 2019-10-27 DIAGNOSIS — Z888 Allergy status to other drugs, medicaments and biological substances status: Secondary | ICD-10-CM | POA: Diagnosis not present

## 2019-10-27 MED ORDER — GLYCOPYRROLATE 0.2 MG/ML IJ SOLN: 0.2000 mg | Freq: Once | INTRAMUSCULAR | Status: AC

## 2019-10-27 MED ORDER — SODIUM CHLORIDE 0.9 % IV SOLN: INTRAVENOUS | Status: DC | PRN

## 2019-10-27 MED ORDER — METHOHEXITAL SODIUM 100 MG/10ML IV SOSY
PREFILLED_SYRINGE | INTRAVENOUS | Status: DC | PRN
  Administered 2019-10-27: 70 mg via INTRAVENOUS

## 2019-10-27 MED ORDER — FENTANYL CITRATE (PF) 100 MCG/2ML IJ SOLN: 25.0000 ug | INTRAMUSCULAR | Status: DC | PRN

## 2019-10-27 MED ORDER — SODIUM CHLORIDE 0.9 % IV SOLN
500.0000 mL | Freq: Once | INTRAVENOUS | Status: AC
  Administered 2019-10-27: 500 mL via INTRAVENOUS

## 2019-10-27 MED ORDER — SUCCINYLCHOLINE CHLORIDE 200 MG/10ML IV SOSY
PREFILLED_SYRINGE | INTRAVENOUS | Status: DC | PRN
  Administered 2019-10-27: 100 mg via INTRAVENOUS

## 2019-10-27 MED ORDER — SUCCINYLCHOLINE CHLORIDE 200 MG/10ML IV SOSY
PREFILLED_SYRINGE | INTRAVENOUS | Status: AC
  Filled 2019-10-27: qty 10

## 2019-10-27 MED ORDER — MIDAZOLAM HCL 2 MG/2ML IJ SOLN
INTRAMUSCULAR | Status: AC
  Filled 2019-10-27: qty 2

## 2019-10-27 MED ORDER — GLYCOPYRROLATE 0.2 MG/ML IJ SOLN
INTRAMUSCULAR | Status: AC
  Administered 2019-10-27: 0.2 mg via INTRAVENOUS
  Filled 2019-10-27: qty 1

## 2019-10-27 MED ORDER — MIDAZOLAM HCL 2 MG/2ML IJ SOLN: 2.0000 mg | Freq: Once | INTRAMUSCULAR | Status: DC

## 2019-10-27 MED ORDER — MIDAZOLAM HCL 2 MG/2ML IJ SOLN
INTRAMUSCULAR | Status: DC | PRN
  Administered 2019-10-27: 1 mg via INTRAVENOUS

## 2019-10-27 MED ORDER — ONDANSETRON HCL 4 MG/2ML IJ SOLN: 4.0000 mg | Freq: Once | INTRAMUSCULAR | Status: DC | PRN

## 2019-10-27 NOTE — Procedures (Signed)
ECT SERVICES Physician's Interval Evaluation & Treatment Note  Patient Identification: Jonathan Alexander. MRN:  [id number] Date of Evaluation:  10/27/2019 TX #: 46  MADRS:   MMSE:   P.E. Findings:  No change physical exam  Psychiatric Interval Note:  Patient says he is feeling good but behavior is calm.  Subjective:  Patient is a 34 y.o. male seen for evaluation for Electroconvulsive Therapy. Feeling good no complaints  Treatment Summary:   []   Right Unilateral             [x]  Bilateral   % Energy : 1.0 ms 100%   Impedance: 1190 ohms  Seizure Energy Index: 12,206 V squared  Postictal Suppression Index: 36%  Seizure Concordance Index: 91%  Medications  Pre Shock: Robinul 0.2 mg Brevital 70 mg succinylcholine 100 mg  Post Shock: Versed 1 mg  Seizure Duration: EMG 22 seconds EEG 30 seconds   Comments: Follow-up 5 weeks  Lungs:  [x]   Clear to auscultation               []  Other:   Heart:    [x]   Regular rhythm             []  irregular rhythm    [x]   Previous H&P reviewed, patient examined and there are NO CHANGES                 []   Previous H&P reviewed, patient examined and there are changes noted.   , MD 3/24/20213:51 PM

## 2019-10-27 NOTE — Anesthesia Procedure Notes (Signed)
Procedure Name: General with mask airway Date/Time: 10/27/2019 10:45 AM Performed by: Rosanne Gutting, CRNA Pre-anesthesia Checklist: Patient identified, Emergency Drugs available, Suction available and Patient being monitored Patient Re-evaluated:Patient Re-evaluated prior to induction Oxygen Delivery Method: Circle system utilized Preoxygenation: Pre-oxygenation with 100% oxygen Induction Type: IV induction Ventilation: Mask ventilation throughout procedure, Mask ventilation with difficulty and Oral airway inserted - appropriate to patient size Airway Equipment and Method: Bite block Placement Confirmation: positive ETCO2 Dental Injury: Teeth and Oropharynx as per pre-operative assessment

## 2019-10-27 NOTE — Anesthesia Postprocedure Evaluation (Signed)
Anesthesia Post Note  Patient: Jonathan Alexander.  Procedure(s) Performed: ECT TX  Patient location during evaluation: PACU Anesthesia Type: General Level of consciousness: awake and alert Pain management: pain level controlled Vital Signs Assessment: post-procedure vital signs reviewed and stable Respiratory status: spontaneous breathing, nonlabored ventilation, respiratory function stable and patient connected to nasal cannula oxygen Cardiovascular status: blood pressure returned to baseline and stable Postop Assessment: no apparent nausea or vomiting Anesthetic complications: no     Last Vitals:  Vitals:   10/27/19 1122 10/27/19 1139  BP: (!) 134/98 135/90  Pulse: (!) 125 (!) 114  Resp: 15 18  Temp: (!) 36.2 C (!) 36.4 C  SpO2: 99%     Last Pain:  Vitals:   10/27/19 1139  TempSrc: Oral  PainSc: 0-No pain                 Yevette Edwards

## 2019-10-27 NOTE — Transfer of Care (Signed)
Immediate Anesthesia Transfer of Care Note  Patient: Jonathan Alexander.  Procedure(s) Performed: ECT TX  Patient Location: PACU  Anesthesia Type:General  Level of Consciousness: drowsy  Airway & Oxygen Therapy: Patient Spontanous Breathing and Patient connected to face mask oxygen  Post-op Assessment: Report given to RN and Post -op Vital signs reviewed and stable  Post vital signs: Reviewed and stable  Last Vitals:  Vitals Value Taken Time  BP 139/82 10/27/19 1104  Temp    Pulse 125 10/27/19 1105  Resp 17 10/27/19 1105  SpO2 100 % 10/27/19 1105  Vitals shown include unvalidated device data.  Last Pain:  Vitals:   10/27/19 0902  TempSrc:   PainSc: 0-No pain         Complications: No apparent anesthesia complications

## 2019-10-27 NOTE — Anesthesia Preprocedure Evaluation (Signed)
Anesthesia Evaluation  Patient identified by MRN, date of birth, ID band Patient awake    Reviewed: Allergy & Precautions, H&P , NPO status , Patient's Chart, lab work & pertinent test results, reviewed documented beta blocker date and time   Airway Mallampati: II   Neck ROM: full    Dental  (+) Poor Dentition   Pulmonary neg pulmonary ROS, Current Smoker,    Pulmonary exam normal        Cardiovascular Exercise Tolerance: Good hypertension, On Medications Normal cardiovascular exam Rhythm:regular Rate:Normal     Neuro/Psych negative neurological ROS  negative psych ROS   GI/Hepatic negative GI ROS, Neg liver ROS,   Endo/Other  negative endocrine ROS  Renal/GU negative Renal ROS  negative genitourinary   Musculoskeletal   Abdominal   Peds  Hematology negative hematology ROS (+)   Anesthesia Other Findings Past Medical History: No date: Schizophrenia (HCC) No date: Tachycardia History reviewed. No pertinent surgical history. BMI    Body Mass Index: 30.52 kg/m     Reproductive/Obstetrics negative OB ROS                             Anesthesia Physical Anesthesia Plan  ASA: II  Anesthesia Plan: General   Post-op Pain Management:    Induction:   PONV Risk Score and Plan:   Airway Management Planned:   Additional Equipment:   Intra-op Plan:   Post-operative Plan:   Informed Consent: I have reviewed the patients History and Physical, chart, labs and discussed the procedure including the risks, benefits and alternatives for the proposed anesthesia with the patient or authorized representative who has indicated his/her understanding and acceptance.     Dental Advisory Given  Plan Discussed with: CRNA  Anesthesia Plan Comments:         Anesthesia Quick Evaluation

## 2019-10-27 NOTE — H&P (Signed)
Jonathan Alexander. is an 34 y.o. male.   Chief Complaint: no complaint HPI: schizoaffective disorder  Past Medical History:  Diagnosis Date  . Schizophrenia (HCC)   . Tachycardia     History reviewed. No pertinent surgical history.  Family History  Family history unknown: Yes   Social History:  reports that he has been smoking cigarettes. He has a 4.00 pack-year smoking history. He has never used smokeless tobacco. He reports that he does not drink alcohol or use drugs.  Allergies:  Allergies  Allergen Reactions  . Divalproex Sodium   . Shellfish-Derived Products     (Not in a hospital admission)   Results for orders placed or performed during the hospital encounter of 10/25/19 (from the past 48 hour(s))  SARS CORONAVIRUS 2 (TAT 6-24 HRS) Nasopharyngeal Nasopharyngeal Swab     Status: None   Collection Time: 10/25/19 12:20 PM   Specimen: Nasopharyngeal Swab  Result Value Ref Range   SARS Coronavirus 2 NEGATIVE NEGATIVE    Comment: (NOTE) SARS-CoV-2 target nucleic acids are NOT DETECTED. The SARS-CoV-2 RNA is generally detectable in upper and lower respiratory specimens during the acute phase of infection. Negative results do not preclude SARS-CoV-2 infection, do not rule out co-infections with other pathogens, and should not be used as the sole basis for treatment or other patient management decisions. Negative results must be combined with clinical observations, patient history, and epidemiological information. The expected result is Negative. Fact Sheet for Patients: HairSlick.no Fact Sheet for Healthcare Providers: quierodirigir.com This test is not yet approved or cleared by the [place] FDA and  has been authorized for detection and/or diagnosis of SARS-CoV-2 by FDA under an Emergency Use Authorization (EUA). This EUA will remain  in effect (meaning this test can be used) for the duration of  the COVID-19 declaration under Section 56 4(b)(1) of the Act, 21 U.S.C. section 360bbb-3(b)(1), unless the authorization is terminated or revoked sooner. Performed at [facility], [address] [place], [place] [address]    No results found.  Review of Systems  Constitutional: Negative.   HENT: Negative.   Eyes: Negative.   Respiratory: Negative.   Cardiovascular: Negative.   Gastrointestinal: Negative.   Musculoskeletal: Negative.   Skin: Negative.   Neurological: Negative.   Psychiatric/Behavioral: Negative.     Blood pressure 125/77, pulse 98, temperature 98.7 F (37.1 C), temperature source Oral, resp. rate 18, height 6' (1.829 m), weight 102.1 kg. Physical Exam  Nursing note and vitals reviewed. Constitutional: He appears well-developed and well-nourished.  HENT:  Head: Normocephalic and atraumatic.  Eyes: Pupils are equal, round, and reactive to light. Conjunctivae are normal.  Cardiovascular: Regular rhythm and normal heart sounds.  Respiratory: Effort normal.  GI: Soft.  Musculoskeletal:        General: Normal range of motion.     Cervical back: Normal range of motion.  Neurological: He is alert.  Skin: Skin is warm and dry.  Psychiatric: He has a normal mood and affect. His behavior is normal. Judgment and thought content normal.     Assessment/Plan Continue regular maintenance schedule  [name], MD 10/27/2019, 9:51 AM

## 2019-10-27 NOTE — Discharge Instructions (Signed)
1)  The drugs that you have been given will stay in your system until tomorrow so for the       next 24 hours you should not:  A. Drive an automobile  B. Make any legal decisions  C. Drink any alcoholic beverages  2)  You may resume your regular meals upon return home.  3)  A responsible adult must take you home.  Someone should stay with you for a few          hours, then be available by phone for the remainder of the treatment day.  4)  You May experience any of the following symptoms:  Headache, Nausea and a dry mouth (due to the medications you were given),  temporary memory loss and some confusion, or sore muscles (a warm bath  should help this).  If you you experience any of these symptoms let us know on                your return visit.  5)  Report any of the following: any acute discomfort, severe headache, or temperature        greater than 100.5 F.   Also report any unusual redness, swelling, drainage, or pain         at your IV site.    You may report Symptoms to:  ECT PROGRAM- [place] at [facility]          Phone: [phone], ECT Department           or Dr. Shary Key office [phone]  6)  Your next ECT Treatment is Wednesday April 28 at 8:30   We will call 2 days prior to your scheduled appointment for arrival times.  7)  Nothing to eat or drink after midnight the night before your procedure.  8)  Take     With a sip of water the morning of your procedure.  9)  Other Instructions: Call [phone] to cancel the morning of your procedure due         to illness or emergency.  10) We will call within 72 hours to assess how you are feeling.

## 2019-11-22 ENCOUNTER — Telehealth: Payer: Self-pay

## 2019-12-06 ENCOUNTER — Telehealth: Payer: Self-pay

## 2019-12-06 ENCOUNTER — Other Ambulatory Visit
Admission: RE | Admit: 2019-12-06 | Discharge: 2019-12-06 | Disposition: A | Discharge status: Home / Self Care | Payer: Medicare Other | Source: Ambulatory Visit | Attending: Psychiatry | Admitting: Psychiatry

## 2019-12-06 DIAGNOSIS — Z20822 Contact with and (suspected) exposure to covid-19: Secondary | ICD-10-CM | POA: Insufficient documentation

## 2019-12-06 DIAGNOSIS — Z01812 Encounter for preprocedural laboratory examination: Secondary | ICD-10-CM | POA: Diagnosis present

## 2019-12-06 LAB — SARS CORONAVIRUS 2 (TAT 6-24 HRS): SARS Coronavirus 2: NEGATIVE

## 2019-12-07 ENCOUNTER — Other Ambulatory Visit: Payer: Self-pay | Admitting: Psychiatry

## 2019-12-08 ENCOUNTER — Encounter
Admission: RE | Admit: 2019-12-08 | Discharge: 2019-12-08 | Disposition: A | Discharge status: Home / Self Care | Payer: Medicare Other | Source: Ambulatory Visit | Attending: Psychiatry | Admitting: Psychiatry

## 2019-12-08 ENCOUNTER — Other Ambulatory Visit: Payer: Self-pay

## 2019-12-08 ENCOUNTER — Ambulatory Visit: Payer: Self-pay | Admitting: Anesthesiology

## 2019-12-08 ENCOUNTER — Encounter: Payer: Self-pay | Admitting: Anesthesiology

## 2019-12-08 DIAGNOSIS — F259 Schizoaffective disorder, unspecified: Secondary | ICD-10-CM | POA: Insufficient documentation

## 2019-12-08 DIAGNOSIS — F17210 Nicotine dependence, cigarettes, uncomplicated: Secondary | ICD-10-CM | POA: Diagnosis not present

## 2019-12-08 DIAGNOSIS — F250 Schizoaffective disorder, bipolar type: Secondary | ICD-10-CM

## 2019-12-08 MED ORDER — GLYCOPYRROLATE 0.2 MG/ML IJ SOLN
INTRAMUSCULAR | Status: AC
  Filled 2019-12-08: qty 1

## 2019-12-08 MED ORDER — SODIUM CHLORIDE 0.9 % IV SOLN: INTRAVENOUS | Status: DC | PRN

## 2019-12-08 MED ORDER — GLYCOPYRROLATE 0.2 MG/ML IJ SOLN
0.2000 mg | Freq: Once | INTRAMUSCULAR | Status: AC
  Administered 2019-12-08: 0.2 mg via INTRAVENOUS

## 2019-12-08 MED ORDER — SUCCINYLCHOLINE CHLORIDE 20 MG/ML IJ SOLN
INTRAMUSCULAR | Status: DC | PRN
  Administered 2019-12-08: 100 mg via INTRAVENOUS

## 2019-12-08 MED ORDER — ONDANSETRON HCL 4 MG/2ML IJ SOLN: 4.0000 mg | Freq: Once | INTRAMUSCULAR | Status: DC | PRN

## 2019-12-08 MED ORDER — MIDAZOLAM HCL 2 MG/2ML IJ SOLN
INTRAMUSCULAR | Status: AC
  Filled 2019-12-08: qty 2

## 2019-12-08 MED ORDER — SODIUM CHLORIDE 0.9 % IV SOLN
500.0000 mL | Freq: Once | INTRAVENOUS | Status: AC
  Administered 2019-12-08: 500 mL via INTRAVENOUS

## 2019-12-08 MED ORDER — MIDAZOLAM HCL 2 MG/2ML IJ SOLN
1.0000 mg | Freq: Once | INTRAMUSCULAR | Status: AC
  Administered 2019-12-08: 1 mg via INTRAVENOUS

## 2019-12-08 MED ORDER — KETOROLAC TROMETHAMINE 30 MG/ML IJ SOLN
INTRAMUSCULAR | Status: AC
  Filled 2019-12-08: qty 1

## 2019-12-08 MED ORDER — FENTANYL CITRATE (PF) 100 MCG/2ML IJ SOLN: 25.0000 ug | INTRAMUSCULAR | Status: DC | PRN

## 2019-12-08 MED ORDER — METHOHEXITAL SODIUM 100 MG/10ML IV SOSY
PREFILLED_SYRINGE | INTRAVENOUS | Status: DC | PRN
  Administered 2019-12-08: 70 mg via INTRAVENOUS

## 2019-12-08 NOTE — Procedures (Signed)
ECT SERVICES Physician's Interval Evaluation & Treatment Note  Patient Identification: Jonathan Alexander. MRN:  [id number] Date of Evaluation:  12/08/2019 TX #: 47  MADRS:   MMSE:   P.E. Findings:  No change to physical exam.  Nothing remarkable  Psychiatric Interval Note:  Mood stated is good.  Patient is calm with appropriate levels of interaction  Subjective:  Patient is a 34 y.o. male seen for evaluation for Electroconvulsive Therapy. No complaints  Treatment Summary:   []   Right Unilateral             [x]  Bilateral   % Energy : 1.0 ms 100%   Impedance: 860 ohms  Seizure Energy Index: 11,895 V squared  Postictal Suppression Index: 93%  Seizure Concordance Index: 96%  Medications  Pre Shock: Robinul 0.2 mg Brevital 70 mg succinylcholine 100 mg  Post Shock: Versed 1 mg  Seizure Duration: 31 seconds by EMG 47 seconds EEG   Comments: Did well.  Follow-up 6 weeks  Lungs:  [x]   Clear to auscultation               []  Other:   Heart:    [x]   Regular rhythm             []  irregular rhythm    [x]   Previous H&P reviewed, patient examined and there are NO CHANGES                 []   Previous H&P reviewed, patient examined and there are changes noted.   , MD 5/5/202111:27 AM

## 2019-12-08 NOTE — Transfer of Care (Signed)
Immediate Anesthesia Transfer of Care Note  Patient: Jonathan Alexander.  Procedure(s) Performed: ECT TX  Patient Location: PACU  Anesthesia Type:General  Level of Consciousness: drowsy and patient cooperative  Airway & Oxygen Therapy: Patient Spontanous Breathing and Patient connected to nasal cannula oxygen  Post-op Assessment: Report given to RN and Post -op Vital signs reviewed and stable  Post vital signs: Reviewed and stable  Last Vitals:  Vitals Value Taken Time  BP 139/97 12/08/19 1131  Temp    Pulse 116 12/08/19 1141  Resp 16 12/08/19 1131  SpO2 94 % 12/08/19 1141  Vitals shown include unvalidated device data.  Last Pain:  Vitals:   12/08/19 1131  PainSc: 0-No pain         Complications: No apparent anesthesia complications

## 2019-12-08 NOTE — H&P (Signed)
Jonathan Alexander. is an 34 y.o. male.   Chief Complaint: Patient has no specific complaint HPI: Longstanding schizoaffective disorder with good control in part due to ECT  Past Medical History:  Diagnosis Date  . Schizophrenia (HCC)   . Tachycardia     History reviewed. No pertinent surgical history.  Family History  Family history unknown: Yes   Social History:  reports that he has been smoking cigarettes. He has a 4.00 pack-year smoking history. He has never used smokeless tobacco. He reports that he does not drink alcohol or use drugs.  Allergies:  Allergies  Allergen Reactions  . Divalproex Sodium   . Shellfish-Derived Products     (Not in a hospital admission)   Results for orders placed or performed during the hospital encounter of 12/06/19 (from the past 48 hour(s))  SARS CORONAVIRUS 2 (TAT 6-24 HRS) Nasopharyngeal Nasopharyngeal Swab     Status: None   Collection Time: 12/06/19  1:15 PM   Specimen: Nasopharyngeal Swab  Result Value Ref Range   SARS Coronavirus 2 NEGATIVE NEGATIVE    Comment: (NOTE) SARS-CoV-2 target nucleic acids are NOT DETECTED. The SARS-CoV-2 RNA is generally detectable in upper and lower respiratory specimens during the acute phase of infection. Negative results do not preclude SARS-CoV-2 infection, do not rule out co-infections with other pathogens, and should not be used as the sole basis for treatment or other patient management decisions. Negative results must be combined with clinical observations, patient history, and epidemiological information. The expected result is Negative. Fact Sheet for Patients: HairSlick.no Fact Sheet for Healthcare Providers: quierodirigir.com This test is not yet approved or cleared by the [place] FDA and  has been authorized for detection and/or diagnosis of SARS-CoV-2 by FDA under an Emergency Use Authorization (EUA). This EUA will remain  in  effect (meaning this test can be used) for the duration of the COVID-19 declaration under Section 56 4(b)(1) of the Act, 21 U.S.C. section 360bbb-3(b)(1), unless the authorization is terminated or revoked sooner. Performed at [facility], [address] [place], [place] [address]    No results found.  Review of Systems  Constitutional: Negative.   HENT: Negative.   Eyes: Negative.   Respiratory: Negative.   Cardiovascular: Negative.   Gastrointestinal: Negative.   Musculoskeletal: Negative.   Skin: Negative.   Neurological: Negative.   Psychiatric/Behavioral: Negative.     Blood pressure 126/85, pulse (!) 124, temperature 98 F (36.7 C), resp. rate 20, SpO2 95 %. Physical Exam  Nursing note and vitals reviewed. Constitutional: He appears well-developed and well-nourished.  HENT:  Head: Normocephalic and atraumatic.  Eyes: Pupils are equal, round, and reactive to light. Conjunctivae are normal.  Cardiovascular: Normal heart sounds.  Respiratory: Effort normal.  GI: Soft.  Musculoskeletal:        General: Normal range of motion.     Cervical back: Normal range of motion.  Neurological: He is alert.  Skin: Skin is warm and dry.  Psychiatric: He has a normal mood and affect. His behavior is normal. Judgment and thought content normal.     Assessment/Plan Treatment today and follow-up 6 weeks as usual.  [name], MD 12/08/2019, 11:26 AM

## 2019-12-08 NOTE — Discharge Instructions (Signed)
1)  The drugs that you have been given will stay in your system until tomorrow so for the       next 24 hours you should not:  A. Drive an automobile  B. Make any legal decisions  C. Drink any alcoholic beverages  2)  You may resume your regular meals upon return home.  3)  A responsible adult must take you home.  Someone should stay with you for a few          hours, then be available by phone for the remainder of the treatment day.  4)  You May experience any of the following symptoms:  Headache, Nausea and a dry mouth (due to the medications you were given),  temporary memory loss and some confusion, or sore muscles (a warm bath  should help this).  If you you experience any of these symptoms let us know on                your return visit.  5)  Report any of the following: any acute discomfort, severe headache, or temperature        greater than 100.5 F.   Also report any unusual redness, swelling, drainage, or pain         at your IV site.    You may report Symptoms to:  ECT PROGRAM- [place] at [facility]          Phone: [phone], ECT Department           or Dr. Shary Key office [phone]  6)  Your next ECT Treatment is Wednesday 16 at 9:00  We will call 2 days prior to your scheduled appointment for arrival times.  7)  Nothing to eat or drink after midnight the night before your procedure.  8)  Take      With a sip of water the morning of your procedure.  9)  Other Instructions: Call [phone] to cancel the morning of your procedure due         to illness or emergency.  10) We will call within 72 hours to assess how you are feeling.

## 2019-12-08 NOTE — Anesthesia Preprocedure Evaluation (Signed)
Anesthesia Evaluation  Patient identified by MRN, date of birth, ID band Patient awake    Reviewed: Allergy & Precautions, H&P , NPO status , Patient's Chart, lab work & pertinent test results, reviewed documented beta blocker date and time   Airway Mallampati: II   Neck ROM: full    Dental  (+) Poor Dentition   Pulmonary neg pulmonary ROS, Current Smoker,    Pulmonary exam normal        Cardiovascular Exercise Tolerance: Good hypertension, Normal cardiovascular exam Rhythm:regular Rate:Normal     Neuro/Psych PSYCHIATRIC DISORDERS Bipolar Disorder Schizophrenia negative neurological ROS     GI/Hepatic negative GI ROS, Neg liver ROS,   Endo/Other  negative endocrine ROS  Renal/GU negative Renal ROS  negative genitourinary   Musculoskeletal   Abdominal   Peds  Hematology negative hematology ROS (+)   Anesthesia Other Findings Past Medical History: No date: Schizophrenia (HCC) No date: Tachycardia History reviewed. No pertinent surgical history.   Reproductive/Obstetrics negative OB ROS                             Anesthesia Physical Anesthesia Plan  ASA: III  Anesthesia Plan: General   Post-op Pain Management:    Induction:   PONV Risk Score and Plan:   Airway Management Planned:   Additional Equipment:   Intra-op Plan:   Post-operative Plan:   Informed Consent: I have reviewed the patients History and Physical, chart, labs and discussed the procedure including the risks, benefits and alternatives for the proposed anesthesia with the patient or authorized representative who has indicated his/her understanding and acceptance.     Dental Advisory Given  Plan Discussed with: CRNA  Anesthesia Plan Comments:         Anesthesia Quick Evaluation

## 2019-12-13 NOTE — Anesthesia Postprocedure Evaluation (Signed)
Anesthesia Post Note  Patient: Thurlow Gallaga.  Procedure(s) Performed: ECT TX  Patient location during evaluation: PACU Anesthesia Type: General Level of consciousness: awake and alert Pain management: pain level controlled Vital Signs Assessment: post-procedure vital signs reviewed and stable Respiratory status: spontaneous breathing, nonlabored ventilation, respiratory function stable and patient connected to nasal cannula oxygen Cardiovascular status: blood pressure returned to baseline and stable Postop Assessment: no apparent nausea or vomiting Anesthetic complications: no     Last Vitals:  Vitals:   12/08/19 1152 12/08/19 1200  BP: 126/80 128/82  Pulse: (!) 109 100  Resp:  16  Temp: 36.7 C 36.7 C  SpO2: 96%     Last Pain:  Vitals:   12/08/19 1200  TempSrc: Oral  PainSc: 0-No pain                 Yevette Edwards

## 2020-01-07 ENCOUNTER — Telehealth: Payer: Self-pay

## 2020-01-10 ENCOUNTER — Telehealth: Payer: Self-pay

## 2020-01-24 ENCOUNTER — Other Ambulatory Visit
Admission: RE | Admit: 2020-01-24 | Discharge: 2020-01-24 | Disposition: A | Discharge status: Home / Self Care | Payer: Medicare Other | Source: Ambulatory Visit | Attending: Psychiatry | Admitting: Psychiatry

## 2020-01-24 ENCOUNTER — Other Ambulatory Visit: Payer: Self-pay

## 2020-01-24 ENCOUNTER — Telehealth: Payer: Self-pay

## 2020-01-24 DIAGNOSIS — Z20822 Contact with and (suspected) exposure to covid-19: Secondary | ICD-10-CM | POA: Insufficient documentation

## 2020-01-24 DIAGNOSIS — Z01812 Encounter for preprocedural laboratory examination: Secondary | ICD-10-CM | POA: Diagnosis present

## 2020-01-25 ENCOUNTER — Other Ambulatory Visit: Payer: Self-pay | Admitting: Psychiatry

## 2020-01-25 LAB — SARS CORONAVIRUS 2 (TAT 6-24 HRS): SARS Coronavirus 2: NEGATIVE

## 2020-01-26 ENCOUNTER — Encounter: Payer: Self-pay | Admitting: Anesthesiology

## 2020-01-26 ENCOUNTER — Other Ambulatory Visit: Payer: Self-pay

## 2020-01-26 ENCOUNTER — Encounter
Admission: RE | Admit: 2020-01-26 | Discharge: 2020-01-26 | Disposition: A | Discharge status: Home / Self Care | Payer: Medicare Other | Source: Ambulatory Visit | Attending: Psychiatry | Admitting: Psychiatry

## 2020-01-26 DIAGNOSIS — F259 Schizoaffective disorder, unspecified: Secondary | ICD-10-CM | POA: Diagnosis present

## 2020-01-26 DIAGNOSIS — F17210 Nicotine dependence, cigarettes, uncomplicated: Secondary | ICD-10-CM | POA: Diagnosis not present

## 2020-01-26 MED ORDER — SODIUM CHLORIDE 0.9 % IV SOLN: 500.0000 mL | Freq: Once | INTRAVENOUS | Status: AC

## 2020-01-26 MED ORDER — SUCCINYLCHOLINE CHLORIDE 200 MG/10ML IV SOSY
PREFILLED_SYRINGE | INTRAVENOUS | Status: AC
  Filled 2020-01-26: qty 10

## 2020-01-26 MED ORDER — LABETALOL HCL 5 MG/ML IV SOLN
INTRAVENOUS | Status: AC
  Filled 2020-01-26: qty 4

## 2020-01-26 MED ORDER — PROPOFOL 10 MG/ML IV BOLUS
INTRAVENOUS | Status: DC | PRN
Start: 2020-01-26 — End: 2020-01-26
  Administered 2020-01-26: 30 mg via INTRAVENOUS

## 2020-01-26 MED ORDER — MIDAZOLAM HCL 2 MG/2ML IJ SOLN: 1.0000 mg | Freq: Once | INTRAMUSCULAR | Status: DC

## 2020-01-26 MED ORDER — SUCCINYLCHOLINE CHLORIDE 20 MG/ML IJ SOLN
INTRAMUSCULAR | Status: DC | PRN
  Administered 2020-01-26: 100 mg via INTRAVENOUS

## 2020-01-26 MED ORDER — GLYCOPYRROLATE 0.2 MG/ML IJ SOLN
INTRAMUSCULAR | Status: AC
  Administered 2020-01-26: 0.1 mg via INTRAVENOUS
  Filled 2020-01-26: qty 1

## 2020-01-26 MED ORDER — ONDANSETRON HCL 4 MG/2ML IJ SOLN: 4.0000 mg | Freq: Once | INTRAMUSCULAR | Status: DC | PRN

## 2020-01-26 MED ORDER — KETAMINE HCL 10 MG/ML IJ SOLN
INTRAMUSCULAR | Status: DC | PRN
  Administered 2020-01-26: 80 mg via INTRAVENOUS

## 2020-01-26 MED ORDER — MIDAZOLAM HCL 2 MG/2ML IJ SOLN
INTRAMUSCULAR | Status: DC | PRN
  Administered 2020-01-26: 2 mg via INTRAVENOUS

## 2020-01-26 MED ORDER — MIDAZOLAM HCL 2 MG/2ML IJ SOLN
INTRAMUSCULAR | Status: AC
  Filled 2020-01-26: qty 2

## 2020-01-26 MED ORDER — GLYCOPYRROLATE 0.2 MG/ML IJ SOLN: 0.1000 mg | Freq: Once | INTRAMUSCULAR | Status: AC

## 2020-01-26 MED ORDER — FENTANYL CITRATE (PF) 100 MCG/2ML IJ SOLN: 25.0000 ug | INTRAMUSCULAR | Status: DC | PRN

## 2020-01-26 NOTE — Anesthesia Procedure Notes (Signed)
Procedure Name: General with mask airway Date/Time: 01/26/2020 10:36 AM Performed by: Irving Burton, CRNA Pre-anesthesia Checklist: Patient identified, Emergency Drugs available, Suction available, Patient being monitored and Timeout performed Patient Re-evaluated:Patient Re-evaluated prior to induction Oxygen Delivery Method: Circle system utilized Preoxygenation: Pre-oxygenation with 100% oxygen Induction Type: IV induction Ventilation: Mask ventilation without difficulty and Mask ventilation throughout procedure Airway Equipment and Method: Bite block Placement Confirmation: positive ETCO2 Dental Injury: Teeth and Oropharynx as per pre-operative assessment

## 2020-01-26 NOTE — Transfer of Care (Signed)
Immediate Anesthesia Transfer of Care Note  Patient: Jonathan Alexander.  Procedure(s) Performed: ECT TX  Patient Location: PACU  Anesthesia Type:General  Level of Consciousness: sedated  Airway & Oxygen Therapy: Patient connected to face mask oxygen  Post-op Assessment: Post -op Vital signs reviewed and stable  Post vital signs: stable  Last Vitals:  Vitals Value Taken Time  BP 121/80 01/26/20 1049  Temp    Pulse 97 01/26/20 1049  Resp 23 01/26/20 1049  SpO2 100 % 01/26/20 1049  Vitals shown include unvalidated device data.  Last Pain:  Vitals:   01/26/20 0853  TempSrc:   PainSc: 0-No pain         Complications: No complications documented.

## 2020-01-26 NOTE — Anesthesia Postprocedure Evaluation (Signed)
Anesthesia Post Note  Patient: Jonathan Alexander.  Procedure(s) Performed: ECT TX  Patient location during evaluation: PACU Anesthesia Type: General Level of consciousness: awake and alert and oriented Pain management: pain level controlled Vital Signs Assessment: post-procedure vital signs reviewed and stable Respiratory status: spontaneous breathing Cardiovascular status: blood pressure returned to baseline Anesthetic complications: no   No complications documented.   Last Vitals:  Vitals:   01/26/20 1119 01/26/20 1145  BP: 132/85 132/87  Pulse: 95 98  Resp: 15 18  Temp: 36.4 C 36.5 C  SpO2: 97%     Last Pain:  Vitals:   01/26/20 1145  TempSrc: Oral  PainSc: 0-No pain                 Deziya Amero

## 2020-01-26 NOTE — Anesthesia Preprocedure Evaluation (Signed)
Anesthesia Evaluation  Patient identified by MRN, date of birth, ID band Patient awake    Reviewed: Allergy & Precautions, H&P , NPO status , Patient's Chart, lab work & pertinent test results, reviewed documented beta blocker date and time   Airway Mallampati: II   Neck ROM: full    Dental  (+) Poor Dentition   Pulmonary neg pulmonary ROS, Current Smoker,    Pulmonary exam normal        Cardiovascular Exercise Tolerance: Good hypertension, Normal cardiovascular exam Rhythm:regular Rate:Normal     Neuro/Psych PSYCHIATRIC DISORDERS Bipolar Disorder Schizophrenia negative neurological ROS     GI/Hepatic negative GI ROS, Neg liver ROS,   Endo/Other  negative endocrine ROS  Renal/GU negative Renal ROS  negative genitourinary   Musculoskeletal   Abdominal   Peds  Hematology negative hematology ROS (+)   Anesthesia Other Findings Past Medical History: No date: Schizophrenia (HCC) No date: Tachycardia History reviewed. No pertinent surgical history.   Reproductive/Obstetrics negative OB ROS                             Anesthesia Physical Anesthesia Plan  ASA: III  Anesthesia Plan: General   Post-op Pain Management:    Induction:   PONV Risk Score and Plan:   Airway Management Planned:   Additional Equipment:   Intra-op Plan:   Post-operative Plan:   Informed Consent: I have reviewed the patients History and Physical, chart, labs and discussed the procedure including the risks, benefits and alternatives for the proposed anesthesia with the patient or authorized representative who has indicated his/her understanding and acceptance.     Dental Advisory Given  Plan Discussed with: CRNA  Anesthesia Plan Comments:         Anesthesia Quick Evaluation  

## 2020-01-26 NOTE — H&P (Signed)
Jonathan Alexander. is an 34 y.o. male.   Chief Complaint: no new complaint HPI: recurrent bipolar schizoaffective  Past Medical History:  Diagnosis Date  . Schizophrenia (HCC)   . Tachycardia     History reviewed. No pertinent surgical history.  Family History  Family history unknown: Yes   Social History:  reports that he has been smoking cigarettes. He has a 4.00 pack-year smoking history. He has never used smokeless tobacco. He reports that he does not drink alcohol and does not use drugs.  Allergies:  Allergies  Allergen Reactions  . Divalproex Sodium   . Shellfish-Derived Products     (Not in a hospital admission)   Results for orders placed or performed during the hospital encounter of 01/24/20 (from the past 48 hour(s))  SARS CORONAVIRUS 2 (TAT 6-24 HRS) Nasopharyngeal Nasopharyngeal Swab     Status: None   Collection Time: 01/24/20  3:05 PM   Specimen: Nasopharyngeal Swab  Result Value Ref Range   SARS Coronavirus 2 NEGATIVE NEGATIVE    Comment: (NOTE) SARS-CoV-2 target nucleic acids are NOT DETECTED.  The SARS-CoV-2 RNA is generally detectable in upper and lower respiratory specimens during the acute phase of infection. Negative results do not preclude SARS-CoV-2 infection, do not rule out co-infections with other pathogens, and should not be used as the sole basis for treatment or other patient management decisions. Negative results must be combined with clinical observations, patient history, and epidemiological information. The expected result is Negative.  Fact Sheet for Patients: HairSlick.no  Fact Sheet for Healthcare Providers: quierodirigir.com  This test is not yet approved or cleared by the [place] FDA and  has been authorized for detection and/or diagnosis of SARS-CoV-2 by FDA under an Emergency Use Authorization (EUA). This EUA will remain  in effect (meaning this test can be used)  for the duration of the COVID-19 declaration under Se ction 564(b)(1) of the Act, 21 U.S.C. section 360bbb-3(b)(1), unless the authorization is terminated or revoked sooner.  Performed at [facility], [address], [place], [place] [address]    No results found.  Review of Systems  Constitutional: Negative.   HENT: Negative.   Eyes: Negative.   Respiratory: Negative.   Cardiovascular: Negative.   Gastrointestinal: Negative.   Musculoskeletal: Negative.   Skin: Negative.   Neurological: Negative.   Psychiatric/Behavioral: Negative.     Blood pressure 124/78, pulse 97, temperature 98.4 F (36.9 C), temperature source Oral, resp. rate 16, SpO2 98 %. Physical Exam  Nursing note and vitals reviewed. Constitutional: He appears well-developed.  HENT:  Head: Normocephalic and atraumatic.  Eyes: Pupils are equal, round, and reactive to light. Conjunctivae are normal.  Cardiovascular: Normal heart sounds.  Respiratory: Effort normal.  GI: Soft.  Musculoskeletal:        General: Normal range of motion.     Cervical back: Normal range of motion.  Neurological: He is alert.  Skin: Skin is warm and dry.  Psychiatric: His affect is blunt. His speech is delayed. He is slowed.     Assessment/Plan Continue maintainence  [name], MD 01/26/2020, 9:56 AM

## 2020-01-31 ENCOUNTER — Telehealth: Payer: Self-pay

## 2020-03-06 ENCOUNTER — Other Ambulatory Visit: Payer: Self-pay | Admitting: Psychiatry

## 2020-03-06 ENCOUNTER — Encounter: Payer: Self-pay | Admitting: Certified Registered"

## 2020-03-06 ENCOUNTER — Inpatient Hospital Stay: Admission: RE | Admit: 2020-03-06 | Payer: Medicare Other | Source: Ambulatory Visit

## 2020-03-15 ENCOUNTER — Other Ambulatory Visit: Payer: Self-pay

## 2020-03-15 ENCOUNTER — Other Ambulatory Visit
Admission: RE | Admit: 2020-03-15 | Discharge: 2020-03-15 | Disposition: A | Discharge status: Home / Self Care | Payer: Medicare Other | Source: Ambulatory Visit | Attending: Psychiatry | Admitting: Psychiatry

## 2020-03-15 DIAGNOSIS — Z01812 Encounter for preprocedural laboratory examination: Secondary | ICD-10-CM | POA: Diagnosis present

## 2020-03-15 DIAGNOSIS — Z20822 Contact with and (suspected) exposure to covid-19: Secondary | ICD-10-CM | POA: Diagnosis not present

## 2020-03-16 ENCOUNTER — Other Ambulatory Visit: Payer: Self-pay | Admitting: Psychiatry

## 2020-03-16 LAB — SARS CORONAVIRUS 2 (TAT 6-24 HRS): SARS Coronavirus 2: NEGATIVE

## 2020-03-17 ENCOUNTER — Encounter: Payer: Self-pay | Admitting: Certified Registered Nurse Anesthetist

## 2020-03-17 ENCOUNTER — Encounter
Admission: RE | Admit: 2020-03-17 | Discharge: 2020-03-17 | Disposition: A | Discharge status: Home / Self Care | Payer: Medicare Other | Source: Ambulatory Visit | Attending: Psychiatry | Admitting: Psychiatry

## 2020-03-17 ENCOUNTER — Other Ambulatory Visit: Payer: Self-pay

## 2020-03-17 DIAGNOSIS — F250 Schizoaffective disorder, bipolar type: Secondary | ICD-10-CM | POA: Diagnosis not present

## 2020-03-17 DIAGNOSIS — F17210 Nicotine dependence, cigarettes, uncomplicated: Secondary | ICD-10-CM | POA: Insufficient documentation

## 2020-03-17 DIAGNOSIS — F209 Schizophrenia, unspecified: Secondary | ICD-10-CM | POA: Diagnosis present

## 2020-03-17 MED ORDER — SODIUM CHLORIDE 0.9 % IV SOLN: 500.0000 mL | Freq: Once | INTRAVENOUS | Status: AC

## 2020-03-17 MED ORDER — SODIUM CHLORIDE 0.9 % IV SOLN
500.0000 mL | Freq: Once | INTRAVENOUS | Status: AC
  Administered 2020-03-17: 500 mL via INTRAVENOUS

## 2020-03-17 MED ORDER — KETAMINE HCL 10 MG/ML IJ SOLN
INTRAMUSCULAR | Status: DC | PRN
  Administered 2020-03-17: 80 mg via INTRAVENOUS

## 2020-03-17 MED ORDER — GLYCOPYRROLATE 0.2 MG/ML IJ SOLN
0.2000 mg | Freq: Once | INTRAMUSCULAR | Status: AC
  Administered 2020-03-17: 0.2 mg via INTRAVENOUS

## 2020-03-17 MED ORDER — GLYCOPYRROLATE 0.2 MG/ML IJ SOLN
INTRAMUSCULAR | Status: AC
  Filled 2020-03-17: qty 1

## 2020-03-17 MED ORDER — MIDAZOLAM HCL 2 MG/2ML IJ SOLN: 2.0000 mg | Freq: Once | INTRAMUSCULAR | Status: DC

## 2020-03-17 MED ORDER — MIDAZOLAM HCL 2 MG/2ML IJ SOLN
INTRAMUSCULAR | Status: DC | PRN
  Administered 2020-03-17: 2 mg via INTRAVENOUS

## 2020-03-17 MED ORDER — LABETALOL HCL 5 MG/ML IV SOLN
INTRAVENOUS | Status: DC | PRN
  Administered 2020-03-17: 10 mg via INTRAVENOUS

## 2020-03-17 MED ORDER — MIDAZOLAM HCL 2 MG/2ML IJ SOLN: 1.0000 mg | Freq: Once | INTRAMUSCULAR | Status: DC

## 2020-03-17 MED ORDER — GLYCOPYRROLATE 0.2 MG/ML IJ SOLN: 0.2000 mg | Freq: Once | INTRAMUSCULAR | Status: DC

## 2020-03-17 MED ORDER — MIDAZOLAM HCL 2 MG/2ML IJ SOLN
INTRAMUSCULAR | Status: AC
  Filled 2020-03-17: qty 2

## 2020-03-17 MED ORDER — PROPOFOL 10 MG/ML IV BOLUS
INTRAVENOUS | Status: DC | PRN
Start: 2020-03-17 — End: 2020-03-17
  Administered 2020-03-17: 30 mg via INTRAVENOUS

## 2020-03-17 MED ORDER — LABETALOL HCL 5 MG/ML IV SOLN
INTRAVENOUS | Status: AC
  Filled 2020-03-17: qty 4

## 2020-03-17 MED ORDER — SUCCINYLCHOLINE CHLORIDE 20 MG/ML IJ SOLN
INTRAMUSCULAR | Status: DC | PRN
  Administered 2020-03-17: 100 mg via INTRAVENOUS

## 2020-03-17 NOTE — Procedures (Signed)
ECT SERVICES Physician's Interval Evaluation & Treatment Note  Patient Identification: Jonathan Alexander. MRN:  [id number] Date of Evaluation:  03/17/2020 TX #: 49  MADRS:   MMSE:   P.E. Findings:  No change to physical exam  Psychiatric Interval Note:  Patient seems stable neither depressed nor manic  Subjective:  Patient is a 34 y.o. male seen for evaluation for Electroconvulsive Therapy. No complaint  Treatment Summary:   []   Right Unilateral             [x]  Bilateral   % Energy : 1.0 ms 100%   Impedance: 1430 ohms  Seizure Energy Index: 14,413 V squared  Postictal Suppression Index: 94%  Seizure Concordance Index: 96%  Medications  Pre Shock: Robinul 0.2 mg ketamine 80 mg propofol 30 mg succinylcholine 100 mg  Post Shock: Versed 1 mg  Seizure Duration: 25 seconds EMG 27 seconds EEG   Comments: Tolerated well and we will see him back in 8 weeks  Lungs:  [x]   Clear to auscultation               []  Other:   Heart:    [x]   Regular rhythm             []  irregular rhythm    [x]   Previous H&P reviewed, patient examined and there are NO CHANGES                 []   Previous H&P reviewed, patient examined and there are changes noted.   , MD 8/13/20214:42 PM

## 2020-03-17 NOTE — Transfer of Care (Signed)
Immediate Anesthesia Transfer of Care Note  Patient: Jonathan Alexander.  Procedure(s) Performed: ECT TX  Patient Location: PACU  Anesthesia Type:General  Level of Consciousness: drowsy  Airway & Oxygen Therapy: Patient Spontanous Breathing and Patient connected to face mask oxygen  Post-op Assessment: Report given to RN and Post -op Vital signs reviewed and stable  Post vital signs: Reviewed and stable  Last Vitals:  Vitals Value Taken Time  BP 108/64 03/17/20 1118  Temp    Pulse 96 03/17/20 1118  Resp 21 03/17/20 1118  SpO2 100 % 03/17/20 1118  Vitals shown include unvalidated device data.  Last Pain:  Vitals:   03/17/20 0914  TempSrc:   PainSc: 0-No pain         Complications: No complications documented.

## 2020-03-17 NOTE — H&P (Signed)
Jonathan Alexander. is an 34 y.o. male.   Chief Complaint: none HPI: recurrent schizoaffective disorder  Past Medical History:  Diagnosis Date  . Schizophrenia (HCC)   . Tachycardia     History reviewed. No pertinent surgical history.  Family History  Family history unknown: Yes   Social History:  reports that he has been smoking cigarettes. He has a 4.00 pack-year smoking history. He has never used smokeless tobacco. He reports that he does not drink alcohol and does not use drugs.  Allergies:  Allergies  Allergen Reactions  . Divalproex Sodium   . Shellfish-Derived Products     (Not in a hospital admission)   Results for orders placed or performed during the hospital encounter of 03/15/20 (from the past 48 hour(s))  SARS CORONAVIRUS 2 (TAT 6-24 HRS) Nasopharyngeal Nasopharyngeal Swab     Status: None   Collection Time: 03/15/20  2:17 PM   Specimen: Nasopharyngeal Swab  Result Value Ref Range   SARS Coronavirus 2 NEGATIVE NEGATIVE    Comment: (NOTE) SARS-CoV-2 target nucleic acids are NOT DETECTED.  The SARS-CoV-2 RNA is generally detectable in upper and lower respiratory specimens during the acute phase of infection. Negative results do not preclude SARS-CoV-2 infection, do not rule out co-infections with other pathogens, and should not be used as the sole basis for treatment or other patient management decisions. Negative results must be combined with clinical observations, patient history, and epidemiological information. The expected result is Negative.  Fact Sheet for Patients: HairSlick.no  Fact Sheet for Healthcare Providers: quierodirigir.com  This test is not yet approved or cleared by the [place] FDA and  has been authorized for detection and/or diagnosis of SARS-CoV-2 by FDA under an Emergency Use Authorization (EUA). This EUA will remain  in effect (meaning this test can be used) for the  duration of the COVID-19 declaration under Se ction 564(b)(1) of the Act, 21 U.S.C. section 360bbb-3(b)(1), unless the authorization is terminated or revoked sooner.  Performed at [facility], [address], [place], [place] [address]    No results found.  Review of Systems  Constitutional: Negative.   HENT: Negative.   Eyes: Negative.   Respiratory: Negative.   Cardiovascular: Negative.   Gastrointestinal: Negative.   Musculoskeletal: Negative.   Skin: Negative.   Neurological: Negative.   Psychiatric/Behavioral: Negative.     Blood pressure 131/74, pulse 97, temperature 98.1 F (36.7 C), temperature source Oral, resp. rate 18, height 5\' 7"  (1.702 m), weight 102.1 kg, SpO2 98 %. Physical Exam Vitals and nursing note reviewed.  Constitutional:      Appearance: He is well-developed.  HENT:     Head: Normocephalic and atraumatic.  Eyes:     Conjunctiva/sclera: Conjunctivae normal.     Pupils: Pupils are equal, round, and reactive to light.  Cardiovascular:     Heart sounds: Normal heart sounds.  Pulmonary:     Effort: Pulmonary effort is normal.  Abdominal:     Palpations: Abdomen is soft.  Musculoskeletal:        General: Normal range of motion.     Cervical back: Normal range of motion.  Skin:    General: Skin is warm and dry.  Neurological:     Mental Status: He is alert.  Psychiatric:        Mood and Affect: Mood normal.      Assessment/Plan Continue periodic maintainance  , MD 03/17/2020, 9:44 AM

## 2020-03-23 NOTE — Anesthesia Postprocedure Evaluation (Signed)
Anesthesia Post Note  Patient: Jonathan Alexander.  Procedure(s) Performed: ECT TX  Patient location during evaluation: PACU Anesthesia Type: General Level of consciousness: awake and alert Pain management: pain level controlled Vital Signs Assessment: post-procedure vital signs reviewed and stable Respiratory status: spontaneous breathing, nonlabored ventilation, respiratory function stable and patient connected to nasal cannula oxygen Cardiovascular status: blood pressure returned to baseline and stable Postop Assessment: no apparent nausea or vomiting Anesthetic complications: no   No complications documented.   Last Vitals:  Vitals:   03/17/20 1140 03/17/20 1207  BP: 115/71 115/71  Pulse: (!) 103 (!) 103  Resp: 17 18  Temp:  (!) 36.3 C  SpO2: 98%     Last Pain:  Vitals:   03/17/20 1207  TempSrc: Oral  PainSc: 0-No pain                 Yevette Edwards

## 2020-03-23 NOTE — Anesthesia Preprocedure Evaluation (Signed)
Anesthesia Evaluation  Patient identified by MRN, date of birth, ID band Patient awake    Reviewed: Allergy & Precautions, H&P , NPO status , Patient's Chart, lab work & pertinent test results, reviewed documented beta blocker date and time   Airway Mallampati: II   Neck ROM: full    Dental  (+) Poor Dentition   Pulmonary neg pulmonary ROS, Current Smoker,    Pulmonary exam normal        Cardiovascular Exercise Tolerance: Good negative cardio ROS Normal cardiovascular exam Rhythm:regular Rate:Normal     Neuro/Psych PSYCHIATRIC DISORDERS Bipolar Disorder Schizophrenia negative neurological ROS  negative psych ROS   GI/Hepatic negative GI ROS, Neg liver ROS,   Endo/Other  negative endocrine ROS  Renal/GU negative Renal ROS  negative genitourinary   Musculoskeletal   Abdominal   Peds  Hematology negative hematology ROS (+)   Anesthesia Other Findings Past Medical History: No date: Schizophrenia (HCC) No date: Tachycardia History reviewed. No pertinent surgical history. BMI    Body Mass Index: 35.25 kg/m     Reproductive/Obstetrics negative OB ROS                             Anesthesia Physical Anesthesia Plan  ASA: II  Anesthesia Plan: General   Post-op Pain Management:    Induction:   PONV Risk Score and Plan:   Airway Management Planned:   Additional Equipment:   Intra-op Plan:   Post-operative Plan:   Informed Consent: I have reviewed the patients History and Physical, chart, labs and discussed the procedure including the risks, benefits and alternatives for the proposed anesthesia with the patient or authorized representative who has indicated his/her understanding and acceptance.     Dental Advisory Given  Plan Discussed with: CRNA  Anesthesia Plan Comments:         Anesthesia Quick Evaluation

## 2020-05-19 ENCOUNTER — Other Ambulatory Visit: Payer: Self-pay

## 2020-05-19 ENCOUNTER — Other Ambulatory Visit
Admission: RE | Admit: 2020-05-19 | Discharge: 2020-05-19 | Disposition: A | Discharge status: Home / Self Care | Payer: Medicare Other | Source: Ambulatory Visit | Attending: Psychiatry | Admitting: Psychiatry

## 2020-05-19 DIAGNOSIS — Z20822 Contact with and (suspected) exposure to covid-19: Secondary | ICD-10-CM | POA: Insufficient documentation

## 2020-05-19 DIAGNOSIS — Z01812 Encounter for preprocedural laboratory examination: Secondary | ICD-10-CM | POA: Diagnosis present

## 2020-05-19 LAB — SARS CORONAVIRUS 2 (TAT 6-24 HRS): SARS Coronavirus 2: NEGATIVE

## 2020-05-21 ENCOUNTER — Other Ambulatory Visit: Payer: Self-pay | Admitting: Psychiatry

## 2020-05-22 ENCOUNTER — Encounter
Admission: RE | Admit: 2020-05-22 | Discharge: 2020-05-22 | Disposition: A | Discharge status: Home / Self Care | Payer: Medicare Other | Source: Ambulatory Visit | Attending: Psychiatry | Admitting: Psychiatry

## 2020-05-22 ENCOUNTER — Other Ambulatory Visit: Payer: Self-pay

## 2020-05-22 ENCOUNTER — Encounter: Payer: Self-pay | Admitting: Certified Registered Nurse Anesthetist

## 2020-05-22 DIAGNOSIS — F17210 Nicotine dependence, cigarettes, uncomplicated: Secondary | ICD-10-CM | POA: Insufficient documentation

## 2020-05-22 DIAGNOSIS — F209 Schizophrenia, unspecified: Secondary | ICD-10-CM | POA: Insufficient documentation

## 2020-05-22 MED ORDER — SODIUM CHLORIDE 0.9 % IV SOLN: 500.0000 mL | Freq: Once | INTRAVENOUS | Status: DC

## 2020-05-22 MED ORDER — KETAMINE HCL 50 MG/ML IJ SOLN
INTRAMUSCULAR | Status: AC
  Filled 2020-05-22: qty 10

## 2020-05-22 MED ORDER — MIDAZOLAM HCL 2 MG/2ML IJ SOLN
INTRAMUSCULAR | Status: AC
  Filled 2020-05-22: qty 2

## 2020-05-22 MED ORDER — PROPOFOL 10 MG/ML IV BOLUS
INTRAVENOUS | Status: AC
  Filled 2020-05-22: qty 20

## 2020-05-22 NOTE — H&P (Signed)
Jonathan Alexander. is an 34 y.o. male.   Chief Complaint: no complaint HPI: recurrent schizoaffective disorder  Past Medical History:  Diagnosis Date  . Schizophrenia (HCC)   . Tachycardia     History reviewed. No pertinent surgical history.  Family History  Family history unknown: Yes   Social History:  reports that he has been smoking cigarettes. He has a 4.00 pack-year smoking history. He has never used smokeless tobacco. He reports that he does not drink alcohol and does not use drugs.  Allergies:  Allergies  Allergen Reactions  . Divalproex Sodium   . Shellfish-Derived Products     (Not in a hospital admission)   No results found for this or any previous visit (from the past 48 hour(s)). No results found.  Review of Systems  Constitutional: Negative.   HENT: Negative.   Eyes: Negative.   Respiratory: Negative.   Cardiovascular: Negative.   Gastrointestinal: Negative.   Musculoskeletal: Negative.   Skin: Negative.   Neurological: Negative.   Psychiatric/Behavioral: Negative.     Blood pressure 128/83, pulse 98, temperature (!) 97.5 F (36.4 C), temperature source Oral, resp. rate 18, height 5\' 7"  (1.702 m), weight 102.1 kg, SpO2 98 %. Physical Exam Vitals and nursing note reviewed.  Constitutional:      Appearance: He is well-developed.  HENT:     Head: Normocephalic and atraumatic.  Eyes:     Conjunctiva/sclera: Conjunctivae normal.     Pupils: Pupils are equal, round, and reactive to light.  Cardiovascular:     Heart sounds: Normal heart sounds.  Pulmonary:     Effort: Pulmonary effort is normal.  Abdominal:     Palpations: Abdomen is soft.  Musculoskeletal:        General: Normal range of motion.     Cervical back: Normal range of motion.  Skin:    General: Skin is warm and dry.  Neurological:     General: No focal deficit present.     Mental Status: He is alert.  Psychiatric:        Mood and Affect: Mood normal.       Assessment/Plan Continue current schedule  , MD 05/22/2020, 12:22 PM

## 2020-05-22 NOTE — Progress Notes (Signed)
Patient stated to anesthesiology that he had eating at 11:00 am this morning crackers for communion. Caregiver notified and patient rescheduled for Monday October 25 at 11:30am. Caregiver ask to try and confirm that the patient remains NPO from midnight until after the ECT procedure. She verbalized understanding. MD notified.

## 2020-05-26 ENCOUNTER — Other Ambulatory Visit
Admission: RE | Admit: 2020-05-26 | Discharge: 2020-05-26 | Disposition: A | Discharge status: Home / Self Care | Payer: Medicare Other | Source: Ambulatory Visit | Attending: Psychiatry | Admitting: Psychiatry

## 2020-05-26 ENCOUNTER — Other Ambulatory Visit: Payer: Self-pay

## 2020-05-26 DIAGNOSIS — Z20822 Contact with and (suspected) exposure to covid-19: Secondary | ICD-10-CM | POA: Insufficient documentation

## 2020-05-26 DIAGNOSIS — Z01812 Encounter for preprocedural laboratory examination: Secondary | ICD-10-CM | POA: Insufficient documentation

## 2020-05-27 LAB — SARS CORONAVIRUS 2 (TAT 6-24 HRS): SARS Coronavirus 2: NEGATIVE

## 2020-05-29 ENCOUNTER — Ambulatory Visit
Admission: RE | Admit: 2020-05-29 | Discharge: 2020-05-29 | Disposition: A | Discharge status: Home / Self Care | Payer: Medicare Other | Source: Ambulatory Visit | Attending: Psychiatry | Admitting: Psychiatry

## 2020-05-29 ENCOUNTER — Other Ambulatory Visit: Payer: Self-pay | Admitting: Psychiatry

## 2020-05-29 ENCOUNTER — Encounter: Payer: Self-pay | Admitting: Anesthesiology

## 2020-05-29 ENCOUNTER — Other Ambulatory Visit: Payer: Self-pay

## 2020-05-29 ENCOUNTER — Ambulatory Visit: Payer: Self-pay | Admitting: Anesthesiology

## 2020-05-29 DIAGNOSIS — Z79899 Other long term (current) drug therapy: Secondary | ICD-10-CM | POA: Insufficient documentation

## 2020-05-29 DIAGNOSIS — F250 Schizoaffective disorder, bipolar type: Secondary | ICD-10-CM | POA: Diagnosis not present

## 2020-05-29 DIAGNOSIS — F259 Schizoaffective disorder, unspecified: Secondary | ICD-10-CM | POA: Insufficient documentation

## 2020-05-29 DIAGNOSIS — Z87891 Personal history of nicotine dependence: Secondary | ICD-10-CM | POA: Diagnosis not present

## 2020-05-29 MED ORDER — MIDAZOLAM HCL 2 MG/2ML IJ SOLN
INTRAMUSCULAR | Status: DC | PRN
  Administered 2020-05-29: 2 mg via INTRAVENOUS

## 2020-05-29 MED ORDER — SODIUM CHLORIDE 0.9 % IV SOLN: 500.0000 mL | Freq: Once | INTRAVENOUS | Status: DC

## 2020-05-29 MED ORDER — GLYCOPYRROLATE 0.2 MG/ML IJ SOLN: 0.2000 mg | Freq: Once | INTRAMUSCULAR | Status: DC

## 2020-05-29 MED ORDER — METHOHEXITAL SODIUM 100 MG/10ML IV SOSY
PREFILLED_SYRINGE | INTRAVENOUS | Status: DC | PRN
  Administered 2020-05-29: 70 mg via INTRAVENOUS

## 2020-05-29 MED ORDER — SUCCINYLCHOLINE CHLORIDE 20 MG/ML IJ SOLN
INTRAMUSCULAR | Status: DC | PRN
  Administered 2020-05-29: 100 mg via INTRAVENOUS

## 2020-05-29 MED ORDER — MIDAZOLAM HCL 2 MG/2ML IJ SOLN
INTRAMUSCULAR | Status: AC
  Filled 2020-05-29: qty 2

## 2020-05-29 MED ORDER — SODIUM CHLORIDE 0.9 % IV SOLN: INTRAVENOUS | Status: DC | PRN

## 2020-05-29 MED ORDER — SUCCINYLCHOLINE CHLORIDE 200 MG/10ML IV SOSY
PREFILLED_SYRINGE | INTRAVENOUS | Status: AC
  Filled 2020-05-29: qty 10

## 2020-05-29 MED ORDER — LABETALOL HCL 5 MG/ML IV SOLN
INTRAVENOUS | Status: DC | PRN
  Administered 2020-05-29: 10 mg via INTRAVENOUS

## 2020-05-29 MED ORDER — MIDAZOLAM HCL 2 MG/2ML IJ SOLN: 1.0000 mg | Freq: Once | INTRAMUSCULAR | Status: DC

## 2020-05-29 MED ORDER — PROPOFOL 10 MG/ML IV BOLUS
INTRAVENOUS | Status: AC
  Filled 2020-05-29: qty 20

## 2020-05-29 MED ORDER — KETAMINE HCL 50 MG/ML IJ SOLN
INTRAMUSCULAR | Status: AC
  Filled 2020-05-29: qty 10

## 2020-05-29 MED ORDER — LABETALOL HCL 5 MG/ML IV SOLN
INTRAVENOUS | Status: AC
  Filled 2020-05-29: qty 4

## 2020-05-29 NOTE — Procedures (Signed)
ECT SERVICES Physician's Interval Evaluation & Treatment Note  Patient Identification: Jonathan Alexander. MRN:  [id number] Date of Evaluation:  05/29/2020 TX #: 50  MADRS:   MMSE:   P.E. Findings:  Unremarkable physical exam  Psychiatric Interval Note:  No complaint.  Calm.  Not agitated.  Subjective:  Patient is a 34 y.o. male seen for evaluation for Electroconvulsive Therapy. No complaints  Treatment Summary:   []   Right Unilateral             [x]  Bilateral   % Energy : 1.0 ms 100%   Impedance: 1290 ohms  Seizure Energy Index: 11,977 V squared  Postictal Suppression Index: 63%  Seizure Concordance Index: 94%  Medications  Pre Shock: Robinul 0.2 mg Brevital 90 mg succinylcholine 100 mg  Post Shock: Versed 1 mg  Seizure Duration: 13 seconds CMG 28 seconds EEG   Comments: Tolerating current schedule well.  Follow-up about 6 weeks  Lungs:  [x]   Clear to auscultation               []  Other:   Heart:    [x]   Regular rhythm             []  irregular rhythm    [x]   Previous H&P reviewed, patient examined and there are NO CHANGES                 []   Previous H&P reviewed, patient examined and there are changes noted.   , MD 10/25/20212:29 PM

## 2020-05-29 NOTE — Anesthesia Preprocedure Evaluation (Signed)
Anesthesia Evaluation  Patient identified by MRN, date of birth, ID band Patient awake    Reviewed: Allergy & Precautions, H&P , NPO status , Patient's Chart, lab work & pertinent test results, reviewed documented beta blocker date and time   History of Anesthesia Complications Negative for: history of anesthetic complications  Airway Mallampati: II   Neck ROM: full    Dental  (+) Poor Dentition   Pulmonary neg pulmonary ROS, Current Smoker,    Pulmonary exam normal        Cardiovascular Exercise Tolerance: Good negative cardio ROS Normal cardiovascular exam Rhythm:regular Rate:Normal     Neuro/Psych PSYCHIATRIC DISORDERS Bipolar Disorder Schizophrenia negative neurological ROS  negative psych ROS   GI/Hepatic negative GI ROS, Neg liver ROS,   Endo/Other  negative endocrine ROS  Renal/GU negative Renal ROS  negative genitourinary   Musculoskeletal   Abdominal   Peds  Hematology negative hematology ROS (+)   Anesthesia Other Findings Past Medical History: No date: Schizophrenia (HCC) No date: Tachycardia History reviewed. No pertinent surgical history. BMI    Body Mass Index: 35.25 kg/m     Reproductive/Obstetrics negative OB ROS                             Anesthesia Physical  Anesthesia Plan  ASA: II  Anesthesia Plan: General   Post-op Pain Management:    Induction:   PONV Risk Score and Plan: 1 and Propofol infusion and TIVA  Airway Management Planned:   Additional Equipment:   Intra-op Plan:   Post-operative Plan:   Informed Consent: I have reviewed the patients History and Physical, chart, labs and discussed the procedure including the risks, benefits and alternatives for the proposed anesthesia with the patient or authorized representative who has indicated his/her understanding and acceptance.     Dental Advisory Given  Plan Discussed with:  CRNA  Anesthesia Plan Comments:         Anesthesia Quick Evaluation

## 2020-05-29 NOTE — Transfer of Care (Signed)
Immediate Anesthesia Transfer of Care Note  Patient: Jonathan Alexander.  Procedure(s) Performed: ECT TX  Patient Location: PACU  Anesthesia Type:General  Level of Consciousness: drowsy and patient cooperative  Airway & Oxygen Therapy: Patient Spontanous Breathing  Post-op Assessment: Report given to RN and Post -op Vital signs reviewed and stable  Post vital signs: Reviewed and stable  Last Vitals:  Vitals Value Taken Time  BP 96/78 05/29/20 1310  Temp    Pulse 101 05/29/20 1311  Resp 7 05/29/20 1311  SpO2 94 % 05/29/20 1311  Vitals shown include unvalidated device data.  Last Pain:  Vitals:   05/29/20 1308  TempSrc:   PainSc: (P) Asleep         Complications: No complications documented.

## 2020-05-29 NOTE — H&P (Signed)
Jonathan Alexander. is an 34 y.o. male.   Chief Complaint: Patient has no complaints today HPI: History of schizoaffective disorder good response to maintenance ECT and medication  Past Medical History:  Diagnosis Date  . Schizophrenia (HCC)   . Tachycardia     History reviewed. No pertinent surgical history.  Family History  Family history unknown: Yes   Social History:  reports that he has been smoking cigarettes. He has a 4.00 pack-year smoking history. He has never used smokeless tobacco. He reports that he does not drink alcohol and does not use drugs.  Allergies:  Allergies  Allergen Reactions  . Divalproex Sodium   . Shellfish-Derived Products     (Not in a hospital admission)   No results found for this or any previous visit (from the past 48 hour(s)). No results found.  Review of Systems  Constitutional: Negative.   HENT: Negative.   Eyes: Negative.   Respiratory: Negative.   Cardiovascular: Negative.   Gastrointestinal: Negative.   Musculoskeletal: Negative.   Skin: Negative.   Neurological: Negative.   Psychiatric/Behavioral: Negative.     Blood pressure 116/72, pulse 99, temperature 97.9 F (36.6 C), resp. rate 13, height 5\' 7"  (1.702 m), weight 102.1 kg, SpO2 93 %. Physical Exam Vitals and nursing note reviewed.  Constitutional:      Appearance: He is well-developed.  HENT:     Head: Normocephalic and atraumatic.  Eyes:     Conjunctiva/sclera: Conjunctivae normal.     Pupils: Pupils are equal, round, and reactive to light.  Cardiovascular:     Heart sounds: Normal heart sounds.  Pulmonary:     Effort: Pulmonary effort is normal.  Abdominal:     Palpations: Abdomen is soft.  Musculoskeletal:        General: Normal range of motion.     Cervical back: Normal range of motion.  Skin:    General: Skin is warm and dry.  Neurological:     General: No focal deficit present.     Mental Status: He is alert.  Psychiatric:        Mood and Affect:  Mood normal.      Assessment/Plan Continue with maintenance ECT as part of his overall treatment plan next treatment in about 6 weeks  , MD 05/29/2020, 2:27 PM

## 2020-05-31 NOTE — Anesthesia Postprocedure Evaluation (Signed)
Anesthesia Post Note  Patient: Jonathan Alexander.  Procedure(s) Performed: ECT TX  Patient location during evaluation: PACU Anesthesia Type: General Level of consciousness: awake and alert Pain management: pain level controlled Vital Signs Assessment: post-procedure vital signs reviewed and stable Respiratory status: spontaneous breathing, nonlabored ventilation, respiratory function stable and patient connected to nasal cannula oxygen Cardiovascular status: blood pressure returned to baseline and stable Postop Assessment: no apparent nausea or vomiting Anesthetic complications: no   No complications documented.   Last Vitals:  Vitals:   05/29/20 1330 05/29/20 1346  BP: 116/72   Pulse: 99   Resp: 13   Temp:  36.6 C  SpO2: 93%     Last Pain:  Vitals:   05/29/20 1346  TempSrc:   PainSc: 0-No pain                 Lenard Simmer

## 2020-07-14 ENCOUNTER — Other Ambulatory Visit: Payer: Self-pay

## 2020-07-14 ENCOUNTER — Other Ambulatory Visit
Admission: RE | Admit: 2020-07-14 | Discharge: 2020-07-14 | Disposition: A | Discharge status: Home / Self Care | Payer: Medicare Other | Source: Ambulatory Visit | Attending: Psychiatry | Admitting: Psychiatry

## 2020-07-14 DIAGNOSIS — Z01812 Encounter for preprocedural laboratory examination: Secondary | ICD-10-CM | POA: Diagnosis present

## 2020-07-14 DIAGNOSIS — Z20822 Contact with and (suspected) exposure to covid-19: Secondary | ICD-10-CM | POA: Insufficient documentation

## 2020-07-15 LAB — SARS CORONAVIRUS 2 (TAT 6-24 HRS): SARS Coronavirus 2: NEGATIVE

## 2020-07-17 ENCOUNTER — Encounter: Payer: Self-pay | Admitting: Certified Registered Nurse Anesthetist

## 2020-07-17 ENCOUNTER — Other Ambulatory Visit: Payer: Self-pay | Admitting: Psychiatry

## 2020-07-17 ENCOUNTER — Ambulatory Visit
Admission: RE | Admit: 2020-07-17 | Discharge: 2020-07-17 | Disposition: A | Discharge status: Home / Self Care | Payer: Medicare Other | Source: Ambulatory Visit | Attending: Psychiatry | Admitting: Psychiatry

## 2020-07-17 ENCOUNTER — Other Ambulatory Visit: Payer: Self-pay

## 2020-07-17 DIAGNOSIS — F17200 Nicotine dependence, unspecified, uncomplicated: Secondary | ICD-10-CM | POA: Insufficient documentation

## 2020-07-17 DIAGNOSIS — Z888 Allergy status to other drugs, medicaments and biological substances status: Secondary | ICD-10-CM | POA: Insufficient documentation

## 2020-07-17 DIAGNOSIS — F250 Schizoaffective disorder, bipolar type: Secondary | ICD-10-CM | POA: Diagnosis present

## 2020-07-17 MED ORDER — MIDAZOLAM HCL 2 MG/2ML IJ SOLN
INTRAMUSCULAR | Status: DC | PRN
  Administered 2020-07-17: 2 mg via INTRAVENOUS

## 2020-07-17 MED ORDER — SODIUM CHLORIDE 0.9 % IV SOLN: 500.0000 mL | Freq: Once | INTRAVENOUS | Status: AC

## 2020-07-17 MED ORDER — LABETALOL HCL 5 MG/ML IV SOLN
INTRAVENOUS | Status: DC | PRN
  Administered 2020-07-17: 10 mg via INTRAVENOUS

## 2020-07-17 MED ORDER — SUCCINYLCHOLINE CHLORIDE 20 MG/ML IJ SOLN
INTRAMUSCULAR | Status: DC | PRN
  Administered 2020-07-17: 100 mg via INTRAVENOUS

## 2020-07-17 MED ORDER — MIDAZOLAM HCL 2 MG/2ML IJ SOLN: 1.0000 mg | Freq: Once | INTRAMUSCULAR | Status: DC

## 2020-07-17 MED ORDER — MIDAZOLAM HCL 2 MG/2ML IJ SOLN
INTRAMUSCULAR | Status: AC
  Filled 2020-07-17: qty 2

## 2020-07-17 MED ORDER — KETAMINE HCL 50 MG/ML IJ SOLN
INTRAMUSCULAR | Status: AC
  Filled 2020-07-17: qty 10

## 2020-07-17 MED ORDER — ONDANSETRON HCL 4 MG/2ML IJ SOLN: 4.0000 mg | Freq: Once | INTRAMUSCULAR | Status: DC | PRN

## 2020-07-17 MED ORDER — GLYCOPYRROLATE 0.2 MG/ML IJ SOLN: 0.2000 mg | Freq: Once | INTRAMUSCULAR | Status: DC

## 2020-07-17 MED ORDER — KETAMINE HCL 10 MG/ML IJ SOLN
INTRAMUSCULAR | Status: DC | PRN
  Administered 2020-07-17: 80 mg via INTRAVENOUS

## 2020-07-17 NOTE — H&P (Signed)
Jonathan Alexander. is an 34 y.o. male.   Chief Complaint: No complaint.  Reports that he has been doing well. HPI: History of schizoaffective disorder with both manic and depressive symptoms.  Good control with ECT and medication  Past Medical History:  Diagnosis Date  . Schizophrenia (HCC)   . Tachycardia     History reviewed. No pertinent surgical history.  Family History  Family history unknown: Yes   Social History:  reports that he has been smoking cigarettes. He has a 4.00 pack-year smoking history. He has never used smokeless tobacco. He reports that he does not drink alcohol and does not use drugs.  Allergies:  Allergies  Allergen Reactions  . Divalproex Sodium   . Shellfish-Derived Products     (Not in a hospital admission)   No results found for this or any previous visit (from the past 48 hour(s)). No results found.  Review of Systems  Constitutional: Negative.   HENT: Negative.   Eyes: Negative.   Respiratory: Negative.   Cardiovascular: Negative.   Gastrointestinal: Negative.   Musculoskeletal: Negative.   Skin: Negative.   Neurological: Negative.   Psychiatric/Behavioral: Negative.     Blood pressure 114/79, pulse (!) 102, temperature 98.9 F (37.2 C), temperature source Oral, resp. rate 16, height 5\' 8"  (1.727 m), weight 105.3 kg, SpO2 98 %. Physical Exam Vitals and nursing note reviewed.  Constitutional:      Appearance: He is well-developed and well-nourished.  HENT:     Head: Normocephalic and atraumatic.  Eyes:     Conjunctiva/sclera: Conjunctivae normal.     Pupils: Pupils are equal, round, and reactive to light.  Cardiovascular:     Heart sounds: Normal heart sounds.  Pulmonary:     Effort: Pulmonary effort is normal.  Abdominal:     Palpations: Abdomen is soft.  Musculoskeletal:        General: Normal range of motion.     Cervical back: Normal range of motion.  Skin:    General: Skin is warm and dry.  Neurological:     Mental  Status: He is alert.  Psychiatric:        Mood and Affect: Mood normal.      Assessment/Plan Continue maintenance ECT schedule.  Treatment today return in approximately 6 weeks  , MD 07/17/2020, 12:27 PM

## 2020-07-17 NOTE — Transfer of Care (Signed)
Immediate Anesthesia Transfer of Care Note  Patient: Jonathan Alexander.  Procedure(s) Performed: ECT TX  Patient Location: PACU  Anesthesia Type:General  Level of Consciousness: drowsy  Airway & Oxygen Therapy: Patient Spontanous Breathing  Post-op Assessment: Report given to RN and Post -op Vital signs reviewed and stable  Post vital signs: Reviewed and stable  Last Vitals:  Vitals Value Taken Time  BP 114/76 07/17/20 1331  Temp    Pulse 95 07/17/20 1332  Resp 13 07/17/20 1332  SpO2 100 % 07/17/20 1332  Vitals shown include unvalidated device data.  Last Pain:  Vitals:   07/17/20 1202  TempSrc:   PainSc: 0-No pain         Complications: No complications documented.

## 2020-07-17 NOTE — Anesthesia Procedure Notes (Addendum)
Procedure Name: General with mask airway Date/Time: 07/17/2020 1:18 PM Performed by: Joanette Gula, Adylin Hankey, CRNA Pre-anesthesia Checklist: Patient identified, Emergency Drugs available, Suction available, Patient being monitored and Timeout performed Patient Re-evaluated:Patient Re-evaluated prior to induction Oxygen Delivery Method: Circle system utilized Preoxygenation: Pre-oxygenation with 100% oxygen Induction Type: IV induction Ventilation: Mask ventilation without difficulty

## 2020-07-17 NOTE — Anesthesia Preprocedure Evaluation (Signed)
Anesthesia Evaluation  Patient identified by MRN, date of birth, ID band Patient awake    Reviewed: Allergy & Precautions, H&P , NPO status , Patient's Chart, lab work & pertinent test results, reviewed documented beta blocker date and time   History of Anesthesia Complications Negative for: history of anesthetic complications  Airway Mallampati: II   Neck ROM: full    Dental  (+) Poor Dentition   Pulmonary neg pulmonary ROS, Current Smoker,    Pulmonary exam normal        Cardiovascular Exercise Tolerance: Good hypertension, Normal cardiovascular exam Rhythm:regular Rate:Normal     Neuro/Psych PSYCHIATRIC DISORDERS Bipolar Disorder Schizophrenia negative neurological ROS  negative psych ROS   GI/Hepatic negative GI ROS, Neg liver ROS,   Endo/Other  negative endocrine ROS  Renal/GU negative Renal ROS  negative genitourinary   Musculoskeletal   Abdominal   Peds  Hematology negative hematology ROS (+)   Anesthesia Other Findings Past Medical History: No date: Schizophrenia (HCC) No date: Tachycardia History reviewed. No pertinent surgical history. BMI    Body Mass Index: 35.25 kg/m     Reproductive/Obstetrics negative OB ROS                             Anesthesia Physical  Anesthesia Plan  ASA: II  Anesthesia Plan: General   Post-op Pain Management:    Induction:   PONV Risk Score and Plan: 1 and Propofol infusion and TIVA  Airway Management Planned:   Additional Equipment:   Intra-op Plan:   Post-operative Plan:   Informed Consent: I have reviewed the patients History and Physical, chart, labs and discussed the procedure including the risks, benefits and alternatives for the proposed anesthesia with the patient or authorized representative who has indicated his/her understanding and acceptance.     Dental Advisory Given  Plan Discussed with: CRNA  Anesthesia  Plan Comments:         Anesthesia Quick Evaluation

## 2020-07-19 NOTE — Procedures (Signed)
ECT SERVICES Physician's Interval Evaluation & Treatment Note  Patient Identification: Jonathan Alexander. MRN:  [id number] Date of Evaluation:  07/17/2020 TX #: 51  MADRS:   MMSE:   P.E. Findings:  normal  Psychiatric Interval Note:  calm  Subjective:  Patient is a 34 y.o. male seen for evaluation for Electroconvulsive Therapy. No return of mania  Treatment Summary:   []   Right Unilateral             []  Bilateral   % Energy : 1.0 ms 100%   Impedance: 960 ohm  Seizure Energy Index: mv 2  Postictal Suppression Index: 89%  Seizure Concordance Index: 98%  Medications  Pre Shock: robinal 0.2mg , ketamine 80mg , propofol 30mg , suc 100mg   Post Shock: ver 1mg   Seizure Duration: 22s emg, 27 s eeg   Comments: Follow up 8 weeks   Lungs:  [x]   Clear to auscultation               []  Other:   Heart:    [x]   Regular rhythm             []  irregular rhythm    [x]   Previous H&P reviewed, patient examined and there are NO CHANGES                 []   Previous H&P reviewed, patient examined and there are changes noted.   , MD 12/15/202110:43 PM

## 2020-07-31 NOTE — Anesthesia Postprocedure Evaluation (Signed)
Anesthesia Post Note  Patient: Jonathan Alexander.  Procedure(s) Performed: ECT TX  Patient location during evaluation: PACU Anesthesia Type: General Level of consciousness: awake and alert Pain management: pain level controlled Vital Signs Assessment: post-procedure vital signs reviewed and stable Respiratory status: spontaneous breathing, nonlabored ventilation, respiratory function stable and patient connected to nasal cannula oxygen Cardiovascular status: blood pressure returned to baseline and stable Postop Assessment: no apparent nausea or vomiting Anesthetic complications: no   No complications documented.   Last Vitals:  Vitals:   07/17/20 1402 07/17/20 1420  BP: 108/77 112/78  Pulse: 95 90  Resp: 15 18  Temp: 37.2 C 37.2 C  SpO2: 97%     Last Pain:  Vitals:   07/17/20 1420  TempSrc: Oral  PainSc: 0-No pain                 Yevette Edwards

## 2020-08-25 ENCOUNTER — Other Ambulatory Visit: Payer: Self-pay

## 2020-08-25 ENCOUNTER — Other Ambulatory Visit
Admission: RE | Admit: 2020-08-25 | Discharge: 2020-08-25 | Disposition: A | Discharge status: Home / Self Care | Payer: Medicare Other | Source: Ambulatory Visit | Attending: Psychiatry | Admitting: Psychiatry

## 2020-08-25 DIAGNOSIS — Z01812 Encounter for preprocedural laboratory examination: Secondary | ICD-10-CM | POA: Insufficient documentation

## 2020-08-25 DIAGNOSIS — U071 COVID-19: Secondary | ICD-10-CM | POA: Insufficient documentation

## 2020-08-26 LAB — SARS CORONAVIRUS 2 (TAT 6-24 HRS): SARS Coronavirus 2: POSITIVE — AB

## 2020-08-28 ENCOUNTER — Inpatient Hospital Stay: Admission: RE | Admit: 2020-08-28 | Payer: Medicare Other | Source: Ambulatory Visit

## 2020-08-28 ENCOUNTER — Other Ambulatory Visit: Payer: Self-pay | Admitting: Psychiatry

## 2020-08-30 ENCOUNTER — Encounter
Admission: RE | Admit: 2020-08-30 | Discharge: 2020-08-30 | Disposition: A | Discharge status: Home / Self Care | Payer: Medicare Other | Source: Ambulatory Visit | Attending: Psychiatry | Admitting: Psychiatry

## 2020-08-30 ENCOUNTER — Other Ambulatory Visit: Payer: Self-pay

## 2020-08-30 DIAGNOSIS — F17210 Nicotine dependence, cigarettes, uncomplicated: Secondary | ICD-10-CM | POA: Insufficient documentation

## 2020-08-30 DIAGNOSIS — F250 Schizoaffective disorder, bipolar type: Secondary | ICD-10-CM | POA: Insufficient documentation

## 2020-08-30 NOTE — Progress Notes (Signed)
Patient discharged preprocedure due to a Positive COVID results. MD notified. Gaurdian and caregiver notified. Patient rescheduled for 09/04/20 at 11:00

## 2020-09-04 ENCOUNTER — Other Ambulatory Visit: Payer: Self-pay | Admitting: Psychiatry

## 2020-09-04 ENCOUNTER — Other Ambulatory Visit: Payer: Self-pay

## 2020-09-04 ENCOUNTER — Encounter (HOSPITAL_COMMUNITY)
Admission: RE | Admit: 2020-09-04 | Discharge: 2020-09-04 | Disposition: A | Discharge status: Home / Self Care | Payer: Medicare Other | Source: Ambulatory Visit | Attending: Psychiatry | Admitting: Psychiatry

## 2020-09-04 ENCOUNTER — Ambulatory Visit: Payer: Self-pay | Admitting: Anesthesiology

## 2020-09-04 ENCOUNTER — Encounter: Payer: Self-pay | Admitting: Anesthesiology

## 2020-09-04 DIAGNOSIS — F250 Schizoaffective disorder, bipolar type: Secondary | ICD-10-CM | POA: Diagnosis not present

## 2020-09-04 MED ORDER — GLYCOPYRROLATE 0.2 MG/ML IJ SOLN: 0.2000 mg | Freq: Once | INTRAMUSCULAR | Status: DC

## 2020-09-04 MED ORDER — PROPOFOL 10 MG/ML IV BOLUS
INTRAVENOUS | Status: AC
  Filled 2020-09-04: qty 20

## 2020-09-04 MED ORDER — METHOHEXITAL SODIUM 0.5 G IJ SOLR
INTRAMUSCULAR | Status: AC
  Filled 2020-09-04: qty 500

## 2020-09-04 MED ORDER — LABETALOL HCL 5 MG/ML IV SOLN
INTRAVENOUS | Status: DC | PRN
  Administered 2020-09-04: 10 mg via INTRAVENOUS

## 2020-09-04 MED ORDER — MIDAZOLAM HCL 2 MG/2ML IJ SOLN
1.0000 mg | Freq: Once | INTRAMUSCULAR | Status: AC
  Administered 2020-09-04: 2 mg via INTRAVENOUS

## 2020-09-04 MED ORDER — MIDAZOLAM HCL 2 MG/2ML IJ SOLN: 2.0000 mg | Freq: Once | INTRAMUSCULAR | Status: DC

## 2020-09-04 MED ORDER — SODIUM CHLORIDE 0.9 % IV SOLN: 500.0000 mL | Freq: Once | INTRAVENOUS | Status: DC

## 2020-09-04 MED ORDER — SUCCINYLCHOLINE CHLORIDE 200 MG/10ML IV SOSY
PREFILLED_SYRINGE | INTRAVENOUS | Status: DC | PRN
  Administered 2020-09-04: 100 mg via INTRAVENOUS

## 2020-09-04 MED ORDER — MIDAZOLAM HCL 2 MG/2ML IJ SOLN
INTRAMUSCULAR | Status: AC
  Filled 2020-09-04: qty 4

## 2020-09-04 MED ORDER — SODIUM CHLORIDE 0.9 % IV SOLN: 500.0000 mL | Freq: Once | INTRAVENOUS | Status: AC

## 2020-09-04 MED ORDER — METHOHEXITAL SODIUM 100 MG/10ML IV SOSY
PREFILLED_SYRINGE | INTRAVENOUS | Status: DC | PRN
  Administered 2020-09-04: 70 mg via INTRAVENOUS

## 2020-09-04 MED ORDER — MIDAZOLAM HCL 2 MG/2ML IJ SOLN
INTRAMUSCULAR | Status: AC
  Filled 2020-09-04: qty 2

## 2020-09-04 MED ORDER — ONDANSETRON HCL 4 MG/2ML IJ SOLN: 4.0000 mg | Freq: Once | INTRAMUSCULAR | Status: DC | PRN

## 2020-09-04 NOTE — Anesthesia Preprocedure Evaluation (Signed)
Anesthesia Evaluation  Patient identified by MRN, date of birth, ID band Patient awake    Reviewed: Allergy & Precautions, H&P , NPO status , Patient's Chart, lab work & pertinent test results, reviewed documented beta blocker date and time   History of Anesthesia Complications Negative for: history of anesthetic complications  Airway Mallampati: II   Neck ROM: full    Dental  (+) Poor Dentition   Pulmonary neg pulmonary ROS, Current Smoker,           Cardiovascular Exercise Tolerance: Good hypertension,      Neuro/Psych PSYCHIATRIC DISORDERS Bipolar Disorder Schizophrenia negative neurological ROS  negative psych ROS   GI/Hepatic negative GI ROS, Neg liver ROS,   Endo/Other  negative endocrine ROS  Renal/GU negative Renal ROS  negative genitourinary   Musculoskeletal   Abdominal   Peds  Hematology negative hematology ROS (+)   Anesthesia Other Findings     Reproductive/Obstetrics negative OB ROS                             Anesthesia Physical  Anesthesia Plan  ASA: II  Anesthesia Plan: General   Post-op Pain Management:    Induction:   PONV Risk Score and Plan: 1  Airway Management Planned:   Additional Equipment:   Intra-op Plan:   Post-operative Plan:   Informed Consent: I have reviewed the patients History and Physical, chart, labs and discussed the procedure including the risks, benefits and alternatives for the proposed anesthesia with the patient or authorized representative who has indicated his/her understanding and acceptance.       Plan Discussed with:   Anesthesia Plan Comments:         Anesthesia Quick Evaluation

## 2020-09-04 NOTE — Anesthesia Postprocedure Evaluation (Signed)
Anesthesia Post Note  Patient: Jonathan Alexander.  Procedure(s) Performed: ECT TX  Patient location during evaluation: PACU Anesthesia Type: General Level of consciousness: awake and alert Pain management: pain level controlled Vital Signs Assessment: post-procedure vital signs reviewed and stable Respiratory status: spontaneous breathing and respiratory function stable Cardiovascular status: stable Anesthetic complications: no   No complications documented.   Last Vitals:  Vitals:   09/04/20 1329 09/04/20 1330  BP: 114/67 103/72  Pulse: 98 98  Resp: (!) 25 (!) 25  Temp: (!) 36.2 C   SpO2: 97% 96%    Last Pain:  Vitals:   09/04/20 1329  TempSrc:   PainSc: Asleep                 KEPHART,WILLIAM K

## 2020-09-04 NOTE — Transfer of Care (Signed)
Immediate Anesthesia Transfer of Care Note  Patient: Jonathan Alexander.  Procedure(s) Performed: ECT TX  Patient Location: PACU  Anesthesia Type:General  Level of Consciousness: awake  Airway & Oxygen Therapy: Patient Spontanous Breathing  Post-op Assessment: Report given to RN  Post vital signs: Reviewed and stable  Last Vitals:  Vitals Value Taken Time  BP 114/67 09/04/20 1328  Temp    Pulse 98 09/04/20 1330  Resp 25 09/04/20 1330  SpO2 96 % 09/04/20 1330  Vitals shown include unvalidated device data.  Last Pain:  Vitals:   09/04/20 1154  TempSrc:   PainSc: 0-No pain         Complications: No complications documented.

## 2020-09-04 NOTE — H&P (Signed)
Jonathan Alexander. is an 35 y.o. male.   Chief Complaint: none acutely HPI: schizoaffective bipolar  Past Medical History:  Diagnosis Date  . Schizophrenia (HCC)   . Tachycardia     History reviewed. No pertinent surgical history.  Family History  Family history unknown: Yes   Social History:  reports that he has been smoking cigarettes. He has a 4.00 pack-year smoking history. He has never used smokeless tobacco. He reports that he does not drink alcohol and does not use drugs.  Allergies:  Allergies  Allergen Reactions  . Divalproex Sodium   . Shellfish-Derived Products     (Not in a hospital admission)   No results found for this or any previous visit (from the past 48 hour(s)). No results found.  Review of Systems  Constitutional: Negative.   HENT: Negative.   Eyes: Negative.   Respiratory: Negative.   Cardiovascular: Negative.   Gastrointestinal: Negative.   Musculoskeletal: Negative.   Skin: Negative.   Neurological: Negative.   Psychiatric/Behavioral: Negative.     Blood pressure 113/84, pulse (!) 103, temperature 98.1 F (36.7 C), temperature source Oral, resp. rate 18, height 5\' 7"  (1.702 m), weight 99.3 kg, SpO2 98 %. Physical Exam Vitals and nursing note reviewed.  Constitutional:      Appearance: He is well-developed and well-nourished.  HENT:     Head: Normocephalic and atraumatic.  Eyes:     Conjunctiva/sclera: Conjunctivae normal.     Pupils: Pupils are equal, round, and reactive to light.  Cardiovascular:     Heart sounds: Normal heart sounds.  Pulmonary:     Effort: Pulmonary effort is normal.  Abdominal:     Palpations: Abdomen is soft.  Musculoskeletal:        General: Normal range of motion.     Cervical back: Normal range of motion.  Skin:    General: Skin is warm and dry.  Neurological:     General: No focal deficit present.     Mental Status: He is alert.  Psychiatric:        Mood and Affect: Mood normal.       Assessment/Plan Continue schedule  , MD 09/04/2020, 12:21 PM

## 2020-09-05 MED ORDER — EPHEDRINE 5 MG/ML INJ
INTRAVENOUS | Status: AC
  Filled 2020-09-05: qty 10

## 2020-09-05 MED ORDER — PROPOFOL 10 MG/ML IV BOLUS
INTRAVENOUS | Status: AC
  Filled 2020-09-05: qty 60

## 2020-09-06 NOTE — Procedures (Signed)
ECT SERVICES Physician's Interval Evaluation & Treatment Note  Patient Identification: Jonathan Alexander. MRN:  [id number] Date of Evaluation:  09/04/2020 TX #: 52  MADRS:   MMSE:   P.E. Findings:  No change physical exam  Psychiatric Interval Note:  Seems to be doing well no complaint  Subjective:  Patient is a 35 y.o. male seen for evaluation for Electroconvulsive Therapy. No complaints  Treatment Summary:   []   Right Unilateral             [x]  Bilateral   % Energy : 1.0 ms 100%   Impedance: 1410 ohms  Seizure Energy Index: 20,346 V squared  Postictal Suppression Index: 92%  Seizure Concordance Index: 97%  Medications  Pre Shock: Robinul 0.2 mg ketamine 80 mg Brevital 70 mg succinylcholine 100 mg  Post Shock: Versed 2 mg  Seizure Duration: 18 seconds EMG 31 seconds EEG   Comments: Follow-up approximately 6 weeks  Lungs:  [x]   Clear to auscultation               []  Other:   Heart:    [x]   Regular rhythm             []  irregular rhythm    [x]   Previous H&P reviewed, patient examined and there are NO CHANGES                 []   Previous H&P reviewed, patient examined and there are changes noted.   , MD 2/2/20225:21 PM

## 2020-10-16 ENCOUNTER — Encounter: Payer: Self-pay | Admitting: Anesthesiology

## 2020-10-16 ENCOUNTER — Inpatient Hospital Stay: Admission: RE | Admit: 2020-10-16 | Payer: Medicare Other | Source: Ambulatory Visit

## 2020-10-19 ENCOUNTER — Other Ambulatory Visit: Payer: Self-pay

## 2020-10-19 ENCOUNTER — Other Ambulatory Visit
Admission: RE | Admit: 2020-10-19 | Discharge: 2020-10-19 | Disposition: A | Discharge status: Home / Self Care | Payer: Medicare Other | Source: Ambulatory Visit | Attending: Psychiatry | Admitting: Psychiatry

## 2020-10-20 ENCOUNTER — Encounter
Admission: RE | Admit: 2020-10-20 | Discharge: 2020-10-20 | Disposition: A | Discharge status: Home / Self Care | Payer: Medicare Other | Source: Ambulatory Visit | Attending: Psychiatry | Admitting: Psychiatry

## 2020-10-20 ENCOUNTER — Other Ambulatory Visit: Payer: Self-pay | Admitting: Psychiatry

## 2020-10-20 ENCOUNTER — Encounter: Payer: Self-pay | Admitting: Registered Nurse

## 2020-10-20 DIAGNOSIS — R000 Tachycardia, unspecified: Secondary | ICD-10-CM | POA: Insufficient documentation

## 2020-10-20 DIAGNOSIS — F250 Schizoaffective disorder, bipolar type: Secondary | ICD-10-CM | POA: Diagnosis not present

## 2020-10-20 DIAGNOSIS — F17210 Nicotine dependence, cigarettes, uncomplicated: Secondary | ICD-10-CM | POA: Insufficient documentation

## 2020-10-20 DIAGNOSIS — F259 Schizoaffective disorder, unspecified: Secondary | ICD-10-CM | POA: Insufficient documentation

## 2020-10-20 MED ORDER — MIDAZOLAM HCL 2 MG/2ML IJ SOLN
INTRAMUSCULAR | Status: AC
  Filled 2020-10-20: qty 2

## 2020-10-20 MED ORDER — MIDAZOLAM HCL 2 MG/2ML IJ SOLN
INTRAMUSCULAR | Status: DC | PRN
  Administered 2020-10-20: 2 mg via INTRAVENOUS

## 2020-10-20 MED ORDER — SUCCINYLCHOLINE CHLORIDE 20 MG/ML IJ SOLN
INTRAMUSCULAR | Status: DC | PRN
  Administered 2020-10-20: 100 mg via INTRAVENOUS

## 2020-10-20 MED ORDER — LABETALOL HCL 5 MG/ML IV SOLN
INTRAVENOUS | Status: AC
  Filled 2020-10-20: qty 4

## 2020-10-20 MED ORDER — LABETALOL HCL 5 MG/ML IV SOLN
INTRAVENOUS | Status: DC | PRN
  Administered 2020-10-20: 10 mg via INTRAVENOUS

## 2020-10-20 MED ORDER — METHOHEXITAL SODIUM 100 MG/10ML IV SOSY
PREFILLED_SYRINGE | INTRAVENOUS | Status: DC | PRN
  Administered 2020-10-20: 70 mg via INTRAVENOUS

## 2020-10-20 MED ORDER — SODIUM CHLORIDE 0.9 % IV SOLN: INTRAVENOUS | Status: DC | PRN

## 2020-10-20 NOTE — Procedures (Signed)
ECT SERVICES Physician's Interval Evaluation & Treatment Note  Patient Identification: Jonathan Alexander. MRN:  [id number] Date of Evaluation:  10/20/2020 TX #: 53  MADRS:   MMSE:   P.E. Findings:  No change physical exam  Psychiatric Interval Note:  Stable blunted but not hostile angry or bizarre  Subjective:  Patient is a 35 y.o. male seen for evaluation for Electroconvulsive Therapy. No complaint  Treatment Summary:   []   Right Unilateral             [x]  Bilateral   % Energy : 1.0 ms 100%   Impedance: 860 ohms  Seizure Energy Index: 20,470 V squared  Postictal Suppression Index: 93%  Seizure Concordance Index: 88%  Medications  Pre Shock: Robinul 0.2 mg ketamine 80 mg succinylcholine 70 mg succinylcholine 100 mg  Post Shock: Versed 2 mg  Seizure Duration: 22 seconds EMG 24 seconds EEG   Comments: Follow-up 2 months  Lungs:  [x]   Clear to auscultation               []  Other:   Heart:    [x]   Regular rhythm             []  irregular rhythm    [x]   Previous H&P reviewed, patient examined and there are NO CHANGES                 []   Previous H&P reviewed, patient examined and there are changes noted.   , MD 3/18/20227:18 PM

## 2020-10-20 NOTE — Anesthesia Preprocedure Evaluation (Signed)
Anesthesia Evaluation  Patient identified by MRN, date of birth, ID band Patient awake    Reviewed: Allergy & Precautions, H&P , NPO status , Patient's Chart, lab work & pertinent test results  Airway Mallampati: II  TM Distance: >3 FB Neck ROM: full    Dental  (+) Chipped   Pulmonary Current Smoker,           Cardiovascular hypertension,      Neuro/Psych PSYCHIATRIC DISORDERS Bipolar Disorder Schizophrenia negative neurological ROS     GI/Hepatic negative GI ROS, Neg liver ROS,   Endo/Other  negative endocrine ROS  Renal/GU negative Renal ROS  negative genitourinary   Musculoskeletal   Abdominal   Peds  Hematology negative hematology ROS (+)   Anesthesia Other Findings Past Medical History: No date: Schizophrenia (HCC) No date: Tachycardia  History reviewed. No pertinent surgical history.     Reproductive/Obstetrics negative OB ROS                             Anesthesia Physical  Anesthesia Plan  ASA: II  Anesthesia Plan: General   Post-op Pain Management:    Induction:   PONV Risk Score and Plan:   Airway Management Planned: Mask  Additional Equipment:   Intra-op Plan:   Post-operative Plan:   Informed Consent: I have reviewed the patients History and Physical, chart, labs and discussed the procedure including the risks, benefits and alternatives for the proposed anesthesia with the patient or authorized representative who has indicated his/her understanding and acceptance.     Dental Advisory Given  Plan Discussed with: Anesthesiologist  Anesthesia Plan Comments:         Anesthesia Quick Evaluation

## 2020-10-20 NOTE — Anesthesia Procedure Notes (Signed)
Performed by: Enedelia Martorelli, CRNA Pre-anesthesia Checklist: Patient identified, Emergency Drugs available, Suction available and Patient being monitored Patient Re-evaluated:Patient Re-evaluated prior to induction Oxygen Delivery Method: Circle system utilized Preoxygenation: Pre-oxygenation with 100% oxygen Induction Type: IV induction Ventilation: Mask ventilation without difficulty and Mask ventilation throughout procedure Airway Equipment and Method: Bite block Placement Confirmation: positive ETCO2 Dental Injury: Teeth and Oropharynx as per pre-operative assessment        

## 2020-10-20 NOTE — Transfer of Care (Signed)
Immediate Anesthesia Transfer of Care Note  Patient: Jonathan Alexander.  Procedure(s) Performed: ECT TX  Patient Location: PACU  Anesthesia Type:General  Level of Consciousness: drowsy  Airway & Oxygen Therapy: Patient Spontanous Breathing and Patient connected to face mask  Post-op Assessment: Report given to RN and Post -op Vital signs reviewed and stable  Post vital signs: Reviewed and stable  Last Vitals:  Vitals Value Taken Time  BP    Temp    Pulse    Resp    SpO2      Last Pain:  Vitals:   10/20/20 1054  TempSrc: Oral  PainSc: 0-No pain         Complications: No complications documented.

## 2020-10-20 NOTE — H&P (Signed)
Jonathan Alexander. is an 35 y.o. male.   Chief Complaint: No complaint HPI: Schizoaffective disorder  Past Medical History:  Diagnosis Date  . Schizophrenia (HCC)   . Tachycardia     History reviewed. No pertinent surgical history.  Family History  Family history unknown: Yes   Social History:  reports that he has been smoking cigarettes. He has a 4.00 pack-year smoking history. He has never used smokeless tobacco. He reports that he does not drink alcohol and does not use drugs.  Allergies:  Allergies  Allergen Reactions  . Divalproex Sodium   . Shellfish-Derived Products     (Not in a hospital admission)   No results found for this or any previous visit (from the past 48 hour(s)). No results found.  Review of Systems  Constitutional: Negative.   HENT: Negative.   Eyes: Negative.   Respiratory: Negative.   Cardiovascular: Negative.   Gastrointestinal: Negative.   Musculoskeletal: Negative.   Skin: Negative.   Neurological: Negative.   Psychiatric/Behavioral: Negative.     Blood pressure 107/78, pulse 100, temperature 98.8 F (37.1 C), temperature source Oral, resp. rate 18, height 5\' 7"  (1.702 m), weight 99.3 kg, SpO2 98 %. Physical Exam Vitals and nursing note reviewed.  Constitutional:      Appearance: He is well-developed.  HENT:     Head: Normocephalic and atraumatic.  Eyes:     Conjunctiva/sclera: Conjunctivae normal.     Pupils: Pupils are equal, round, and reactive to light.  Cardiovascular:     Heart sounds: Normal heart sounds.  Pulmonary:     Effort: Pulmonary effort is normal.  Abdominal:     Palpations: Abdomen is soft.  Musculoskeletal:        General: Normal range of motion.     Cervical back: Normal range of motion.  Skin:    General: Skin is warm and dry.  Neurological:     Mental Status: He is alert.  Psychiatric:        Mood and Affect: Mood normal.        Behavior: Behavior normal.        Thought Content: Thought content  normal.      Assessment/Plan Doing well tolerates treatment well seems to benefit even from separated intermittent treatments  , MD 10/20/2020, 7:17 PM

## 2020-10-22 NOTE — Anesthesia Postprocedure Evaluation (Signed)
Anesthesia Post Note  Patient: Jonathan Alexander.  Procedure(s) Performed: ECT TX  Patient location during evaluation: PACU Anesthesia Type: General Level of consciousness: awake and alert Pain management: pain level controlled Vital Signs Assessment: post-procedure vital signs reviewed and stable Respiratory status: spontaneous breathing, nonlabored ventilation and respiratory function stable Cardiovascular status: blood pressure returned to baseline and stable Postop Assessment: no apparent nausea or vomiting Anesthetic complications: no   No complications documented.   Last Vitals:  Vitals:   10/20/20 1440 10/20/20 1527  BP: 108/78 107/78  Pulse: (!) 103 100  Resp: 16 18  Temp:  37.1 C  SpO2: 98%     Last Pain:  Vitals:   10/20/20 1527  TempSrc: Oral  PainSc: 0-No pain                 Aurelio Brash Ramsdell

## 2020-12-05 ENCOUNTER — Emergency Department
Admission: EM | Admit: 2020-12-05 | Discharge: 2020-12-05 | Disposition: A | Discharge status: Home / Self Care | Payer: Medicare HMO | Attending: Emergency Medicine | Admitting: Emergency Medicine

## 2020-12-05 ENCOUNTER — Other Ambulatory Visit: Payer: Self-pay

## 2020-12-05 ENCOUNTER — Emergency Department: Payer: Medicare HMO

## 2020-12-05 DIAGNOSIS — R000 Tachycardia, unspecified: Secondary | ICD-10-CM | POA: Diagnosis not present

## 2020-12-05 DIAGNOSIS — R059 Cough, unspecified: Secondary | ICD-10-CM | POA: Diagnosis present

## 2020-12-05 DIAGNOSIS — Z7984 Long term (current) use of oral hypoglycemic drugs: Secondary | ICD-10-CM | POA: Insufficient documentation

## 2020-12-05 DIAGNOSIS — F17210 Nicotine dependence, cigarettes, uncomplicated: Secondary | ICD-10-CM | POA: Insufficient documentation

## 2020-12-05 DIAGNOSIS — Z79899 Other long term (current) drug therapy: Secondary | ICD-10-CM | POA: Insufficient documentation

## 2020-12-05 DIAGNOSIS — Z20822 Contact with and (suspected) exposure to covid-19: Secondary | ICD-10-CM | POA: Insufficient documentation

## 2020-12-05 DIAGNOSIS — I10 Essential (primary) hypertension: Secondary | ICD-10-CM | POA: Diagnosis not present

## 2020-12-05 DIAGNOSIS — J189 Pneumonia, unspecified organism: Secondary | ICD-10-CM | POA: Diagnosis not present

## 2020-12-05 DIAGNOSIS — D72829 Elevated white blood cell count, unspecified: Secondary | ICD-10-CM | POA: Diagnosis not present

## 2020-12-05 LAB — COMPREHENSIVE METABOLIC PANEL
ALT: 11 U/L (ref 0–44)
AST: 15 U/L (ref 15–41)
Albumin: 3.8 g/dL (ref 3.5–5.0)
Alkaline Phosphatase: 98 U/L (ref 38–126)
Anion gap: 10 (ref 5–15)
BUN: 9 mg/dL (ref 6–20)
CO2: 27 mmol/L (ref 22–32)
Calcium: 9.7 mg/dL (ref 8.9–10.3)
Chloride: 101 mmol/L (ref 98–111)
Creatinine, Ser: 1.08 mg/dL (ref 0.61–1.24)
GFR, Estimated: >60 mL/min (ref >60)
Glucose, Bld: 129 mg/dL — ABNORMAL HIGH (ref 70–99)
Potassium: 4.1 mmol/L (ref 3.5–5.1)
Sodium: 138 mmol/L (ref 135–145)
Total Bilirubin: 0.5 mg/dL (ref 0.3–1.2)
Total Protein: 7.6 g/dL (ref 6.5–8.1)

## 2020-12-05 LAB — CBC WITH DIFFERENTIAL/PLATELET
Abs Immature Granulocytes: 0.07 10*3/uL (ref 0.00–0.07)
Basophils Absolute: 0 10*3/uL (ref 0.0–0.1)
Basophils Relative: 0 %
Eosinophils Absolute: 0 10*3/uL (ref 0.0–0.5)
Eosinophils Relative: 0 %
HCT: 40.3 % (ref 39.0–52.0)
Hemoglobin: 12.8 g/dL — ABNORMAL LOW (ref 13.0–17.0)
Immature Granulocytes: 1 %
Lymphocytes Relative: 16 %
Lymphs Abs: 1.9 10*3/uL (ref 0.7–4.0)
MCH: 29.7 pg (ref 26.0–34.0)
MCHC: 31.8 g/dL (ref 30.0–36.0)
MCV: 93.5 fL (ref 80.0–100.0)
Monocytes Absolute: 0.8 10*3/uL (ref 0.1–1.0)
Monocytes Relative: 6 %
Neutro Abs: 8.9 10*3/uL — ABNORMAL HIGH (ref 1.7–7.7)
Neutrophils Relative %: 77 %
Platelets: 343 10*3/uL (ref 150–400)
RBC: 4.31 MIL/uL (ref 4.22–5.81)
RDW: 12.9 % (ref 11.5–15.5)
WBC: 11.7 10*3/uL — ABNORMAL HIGH (ref 4.0–10.5)
nRBC: 0 % (ref 0.0–0.2)

## 2020-12-05 LAB — RESP PANEL BY RT-PCR (FLU A&B, COVID) ARPGX2
Influenza A by PCR: NEGATIVE
Influenza B by PCR: NEGATIVE
SARS Coronavirus 2 by RT PCR: NEGATIVE

## 2020-12-05 LAB — D-DIMER, QUANTITATIVE: D-Dimer, Quant: 0.53 ug/mL-FEU — ABNORMAL HIGH (ref 0.00–0.50)

## 2020-12-05 MED ORDER — IOHEXOL 350 MG/ML SOLN
75.0000 mL | Freq: Once | INTRAVENOUS | Status: AC | PRN
  Administered 2020-12-05: 75 mL via INTRAVENOUS
  Filled 2020-12-05: qty 75

## 2020-12-05 MED ORDER — AZITHROMYCIN 250 MG PO TABS: ORAL_TABLET | ORAL | 0 refills | Status: DC

## 2020-12-05 MED ORDER — AMOXICILLIN 500 MG PO CAPS
1000.0000 mg | ORAL_CAPSULE | Freq: Once | ORAL | Status: AC
  Administered 2020-12-05: 1000 mg via ORAL
  Filled 2020-12-05: qty 2

## 2020-12-05 MED ORDER — AMOXICILLIN 400 MG/5ML PO SUSR: 1000.0000 mg | Freq: Three times a day (TID) | ORAL | 0 refills | Status: AC

## 2020-12-05 MED ORDER — LACTATED RINGERS IV BOLUS
1000.0000 mL | Freq: Once | INTRAVENOUS | Status: AC
  Administered 2020-12-05: 1000 mL via INTRAVENOUS

## 2020-12-05 MED ORDER — AMOXICILLIN 500 MG PO CAPS: 500.0000 mg | ORAL_CAPSULE | Freq: Three times a day (TID) | ORAL | 0 refills | Status: DC

## 2020-12-05 MED ORDER — AZITHROMYCIN 200 MG/5ML PO SUSR: 250.0000 mg | Freq: Every day | ORAL | 0 refills | Status: AC

## 2020-12-05 MED ORDER — AZITHROMYCIN 500 MG PO TABS
500.0000 mg | ORAL_TABLET | Freq: Once | ORAL | Status: AC
  Administered 2020-12-05: 500 mg via ORAL
  Filled 2020-12-05: qty 1

## 2020-12-05 NOTE — ED Notes (Signed)
See triage note  Presents with cough,h/a and sore throat   States this started couple of days ago  Low grade temp on arrival

## 2020-12-05 NOTE — Discharge Instructions (Signed)
Please take the antibiotics as prescribed. Return to the ER if you experience any worsening of symptoms or changes.

## 2020-12-05 NOTE — ED Triage Notes (Addendum)
Pt arrives via ems, pt states that he has had a cough and sore throat for a while and when he coughs it makes his head hurt, pt arrives from [facility] according to pt, pt's next ect treatment is scheduled for the 20th according to EMS.  Pt's guardian, [name] was notified of pt's arrival to the ER and his chief complaint by this RN

## 2020-12-05 NOTE — ED Notes (Signed)
Patient's pulse ox increased to 98% while ambulating, RR 18. Patient's group home was contacted and they said to send the prescription to [facility] in [place]. Report given to Latea at patient's group home who states someone will be here in 40 minutes to pick up the patient.

## 2020-12-05 NOTE — ED Notes (Signed)
Patient came asking for water. Patient was informed that the CT scan report needed to come back first.

## 2020-12-05 NOTE — ED Provider Notes (Signed)
[facility] Emergency Department Provider Note  ____________________________________________   Event Date/Time   First MD Initiated Contact with Patient 12/05/20 1654     (approximate)  I have reviewed the triage vital signs and the nursing notes.   HISTORY  Chief Complaint Cough and Sore Throat  History is limited by patient's known schizophrenia and generally a poor historian  HPI Jonathan Alexander. is a 35 y.o. male with past medical history significant for schizophrenia with treatment with ECT who reports to the emergency department for evaluation of cough, headache, sore throat.  He also endorses associated shortness of breath.  He states that this has been going on "a while" and is unable to quantify for me the amount of time that this has been going on.  Patient is coming from a group home facility, denies knowing of anyone else being sick.  He does not know if he has had a fever, denies any chest pain, abdominal pain, nausea vomiting diarrhea.  He is unable to tell me if his shortness of breath is worse at rest or with exertion, history is significantly limited by patient's condition.       Past Medical History:  Diagnosis Date  . Schizophrenia (HCC)   . Tachycardia     Patient Active Problem List   Diagnosis Date Noted  . Schizophrenia (HCC) 01/13/2019  . Schizoaffective disorder, bipolar type (HCC) 02/09/2018  . Hypertension 02/09/2018    No past surgical history on file.  Prior to Admission medications   Medication Sig Start Date End Date Taking? Authorizing Provider  amoxicillin (AMOXIL) 400 MG/5ML suspension Take 12.5 mLs (1,000 mg total) by mouth 3 (three) times daily for 7 days. 12/05/20 12/12/20 Yes Colden Samaras, Ruben Gottron, PA  azithromycin (ZITHROMAX) 200 MG/5ML suspension Take 6.3 mLs (250 mg total) by mouth daily for 5 days. 12/05/20 12/10/20 Yes Coleman Kalas, Ruben Gottron, PA  albuterol (VENTOLIN HFA) 108 (90 Base) MCG/ACT inhaler Inhale 2 puffs  into the lungs every 6 (six) hours as needed for wheezing or shortness of breath. 05/17/19   Minna Antis, MD  clonazePAM (KLONOPIN) 1 MG tablet Take 1 tablet (1 mg total) by mouth 3 (three) times daily. 01/26/19   Clapacs, Jackquline Denmark, MD  cloZAPine (CLOZARIL) 100 MG tablet TAKE 3 AND 1/2 TABLETS (350MG ) BY MOUTH AT BEDTIME 03/23/19   Clapacs, 03/25/19, MD  desmopressin (DDAVP) 0.2 MG tablet Take 1 tablet (0.2 mg total) by mouth at bedtime. 01/26/19   Clapacs, 01/28/19, MD  EPINEPHrine 0.3 mg/0.3 mL IJ SOAJ injection Inject 0.3 mg into the muscle as directed.    [provider]  ferrous sulfate 325 (65 FE) MG tablet Take 1 tablet (325 mg total) by mouth daily with breakfast. 01/26/19   Clapacs, 01/28/19, MD  imipramine (TOFRANIL) 50 MG tablet Take 2 tablets (100 mg total) by mouth at bedtime. 01/26/19   Clapacs, 01/28/19, MD  lithium carbonate (ESKALITH) 450 MG CR tablet Take 2 tablets (900 mg total) by mouth at bedtime. 01/26/19   Clapacs, 01/28/19, MD  loratadine (CLARITIN) 10 MG tablet Take 1 tablet (10 mg total) by mouth daily. 01/26/19   Clapacs, 01/28/19, MD  metFORMIN (GLUCOPHAGE) 500 MG tablet Take 1 tablet (500 mg total) by mouth 2 (two) times daily with a meal. 01/26/19   Clapacs, 01/28/19, MD  metoprolol succinate (TOPROL-XL) 100 MG 24 hr tablet Take 1 tablet (100 mg total) by mouth daily. 01/27/19   Clapacs, 01/29/19,  MD  OLANZapine (ZYPREXA) 20 MG tablet Take 1 tablet (20 mg total) by mouth at bedtime. 01/26/19   Clapacs, Jackquline Denmark, MD  ondansetron (ZOFRAN ODT) 4 MG disintegrating tablet Allow 1-2 tablets to dissolve in your mouth every 8 hours as needed for nausea/vomiting 09/12/19   Loleta Rose, MD  polyethylene glycol (MIRALAX / GLYCOLAX) 17 g packet Take 17 g by mouth every morning. 01/26/19   Clapacs, Jackquline Denmark, MD  venlafaxine XR (EFFEXOR-XR) 75 MG 24 hr capsule Take 1 capsule (75 mg total) by mouth daily with breakfast. 01/27/19   Clapacs, Jackquline Denmark, MD    Allergies Divalproex sodium and  Shellfish-derived products  Family History  Family history unknown: Yes    Social History Social History   Tobacco Use  . Smoking status: Current Every Day Smoker    Packs/day: 1.00    Years: 4.00    Pack years: 4.00    Types: Cigarettes  . Smokeless tobacco: Never Used  Substance Use Topics  . Alcohol use: No  . Drug use: No    Review of Systems Constitutional: No fever/chills Eyes: No visual changes. ENT: + sore throat. Cardiovascular: Denies chest pain. Respiratory: + Cough,+ shortness of breath. Gastrointestinal: No abdominal pain.  No nausea, no vomiting.  No diarrhea.  No constipation. Genitourinary: Negative for dysuria. Musculoskeletal: Negative for back pain. Skin: Negative for rash. Neurological: + for headaches, negative for focal weakness or numbness.  ____________________________________________   PHYSICAL EXAM:  VITAL SIGNS: ED Triage Vitals  Enc Vitals Group     BP 12/05/20 1557 122/76     Pulse Rate 12/05/20 1557 (!) 117     Resp 12/05/20 1901 18     Temp 12/05/20 1557 99 F (37.2 C)     Temp Source 12/05/20 1557 Oral     SpO2 12/05/20 1557 95 %     Weight 12/05/20 1558 219 lb (99.3 kg)     Height 12/05/20 1558 5\' 10"  (1.778 m)     Head Circumference --      Peak Flow --      Pain Score 12/05/20 1557 10     Pain Loc --      Pain Edu? --      Excl. in GC? --    Constitutional: Alert and oriented. Well appearing and in no acute distress. Eyes: Conjunctivae are normal. PERRL. EOMI. Head: Atraumatic. Nose: No congestion/rhinnorhea. Mouth/Throat: Mucous membranes are moist.  Oropharynx erythematous with no tonsillar enlargement or exudate. Neck: No stridor.   Lymphatic: No cervical lymphadenopathy Cardiovascular: Normal rate, regular rhythm. Grossly normal heart sounds.  Good peripheral circulation. Respiratory: Normal respiratory effort.  No retractions.  rhonchi heard throughout the left middle and lower lobe, no right lobe rhonchi  appreciated.  No wheezing or crackles. Gastrointestinal: Soft and nontender. No distention. No abdominal bruits. No CVA tenderness. Musculoskeletal: No lower extremity tenderness nor edema.  No joint effusions. Neurologic:  Normal speech and language. No gross focal neurologic deficits are appreciated. No gait instability. Skin:  Skin is warm, dry and intact. No rash noted.  ____________________________________________   LABS (all labs ordered are listed, but only abnormal results are displayed)  Labs Reviewed  COMPREHENSIVE METABOLIC PANEL - Abnormal; Notable for the following components:      Result Value   Glucose, Bld 129 (*)    All other components within normal limits  CBC WITH DIFFERENTIAL/PLATELET - Abnormal; Notable for the following components:   WBC 11.7 (*)    Hemoglobin 12.8 (*)  Neutro Abs 8.9 (*)    All other components within normal limits  D-DIMER, QUANTITATIVE - Abnormal; Notable for the following components:   D-Dimer, Quant 0.53 (*)    All other components within normal limits  RESP PANEL BY RT-PCR (FLU A&B, COVID) [id number]   ____________________________________________  EKG  Sinus tachycardia with a rate of 108, QTc interval 399.  No ST elevation or depression to suggest acute ischemia, normal QT intervals. ____________________________________________  RADIOLOGY Susa RaringI, Juanita Streight J Addie Alonge, personally viewed and evaluated these images (plain radiographs) as part of my medical decision making, as well as reviewing the written report by the radiologist.  ED provider interpretation: Chest x-ray shows left lower lobe infiltrate, radiology reports bilateral infiltrates.  See radiology report for CT findings.  Official radiology report(s): CT Angio Chest PE W and/or Wo Contrast  Result Date: 12/05/2020 CLINICAL DATA:  Cough. EXAM: CT ANGIOGRAPHY CHEST WITH CONTRAST TECHNIQUE: Multidetector CT imaging of the chest was performed using the standard protocol during bolus  administration of intravenous contrast. Multiplanar CT image reconstructions and MIPs were obtained to evaluate the vascular anatomy. CONTRAST:  75mL OMNIPAQUE IOHEXOL 350 MG/ML SOLN COMPARISON:  None. FINDINGS: Cardiovascular: The subsegmental pulmonary arteries are limited in evaluation secondary to patient motion and suboptimal opacification with intravenous contrast. No intraluminal filling defects are identified. No evidence of pulmonary embolism. Normal heart size. No pericardial effusion. Mediastinum/Nodes: There is mild AP window, subcarinal and bilateral hilar lymphadenopathy. Thyroid gland, trachea, and esophagus demonstrate no significant findings. Lungs/Pleura: Mild areas of linear atelectasis and/or early infiltrate are seen within the bilateral lower lobes and inferior aspect of the left upper lobe. There is no evidence of a pleural effusion or pneumothorax. Upper Abdomen: No acute abnormality. Elevation of the right hemidiaphragm is seen. Musculoskeletal: No chest wall abnormality. No acute or significant osseous findings. Review of the MIP images confirms the above findings. IMPRESSION: 1. Mild bilateral lower lobe and left upper lobe atelectasis and/or early infiltrate. 2. Mild hilar and mediastinal lymphadenopathy, likely reactive in nature. Electronically Signed   By: Aram Candelahaddeus  Houston M.D.   On: 12/05/2020 21:11   DG Chest Portable 1 View  Result Date: 12/05/2020 CLINICAL DATA:  Cough, short of breath, mid chest pain EXAM: PORTABLE CHEST 1 VIEW COMPARISON:  05/17/2019 FINDINGS: Single frontal view of the chest demonstrates an unremarkable cardiac silhouette. There is patchy bibasilar consolidation greatest at the left lung base. No effusion or pneumothorax. No acute bony abnormalities. IMPRESSION: 1. Patchy bibasilar airspace disease, left greater than right, consistent with pneumonia. Electronically Signed   By: Sharlet SalinaMichael  Brown M.D.   On: 12/05/2020 17:43    ____________________________________________   INITIAL IMPRESSION / ASSESSMENT AND PLAN / ED COURSE  As part of my medical decision making, I reviewed the following data within the electronic MEDICAL RECORD NUMBER Nursing notes reviewed and incorporated, Labs reviewed, Radiograph reviewed and Notes from prior ED visits        Patient is a 35 year old male who presents to the emergency department for evaluation of cough, shortness of breath for "a while".  He is unable to quantify the amount of time this has been present.  In triage, the patient is tachycardic with a rate of 117, temperature of 99, SPO2 of 95%.  Pressure is within normal limits.  On physical exam, the patient does have appreciable rhonchi on the left lobes, none on the right.  Remaining physical exam is unremarkable.  Evaluation began with respiratory panel and chest x-ray.  X-ray is concerning  for bilateral infiltrates, respiratory panel is negative.  Given the patient's tachycardia with reported short of breath as well as these bilateral infiltrates, laboratory evaluation was begun with CBC, CMP and D-dimer.  Patient does have a mild leukocytosis of 11.7, D-dimer is elevated at 0.53.  Given his elevated dimer, CT angio was obtained to rule out PE and is negative for associated PE.  Patient's tachycardia did mildly improve with administration of 1 L of lactated Ringer's.  Will initiate patient on antibiotics for community-acquired pneumonia including amoxicillin and azithromycin.  The patient did tolerate this with oral tablets here, however when the patient's legal guardian and group home was contacted at his discharge, they requested that these be given orally, and the prescription outpatient was changed.  Return precautions were discussed with the patient and with his caregivers and they are amenable with plan at this time.  Patient is stable for outpatient therapy.      ____________________________________________   FINAL  CLINICAL IMPRESSION(S) / ED DIAGNOSES  Final diagnoses:  Community acquired pneumonia, unspecified laterality     ED Discharge Orders         Ordered    azithromycin (ZITHROMAX Z-PAK) 250 MG tablet  Status:  Discontinued        12/05/20 2201    amoxicillin (AMOXIL) 500 MG capsule  3 times daily,   Status:  Discontinued        12/05/20 2201    azithromycin (ZITHROMAX) 200 MG/5ML suspension  Daily        12/05/20 2204    amoxicillin (AMOXIL) 400 MG/5ML suspension  3 times daily        12/05/20 2204          *Please note:  Garris Georg Ang. was evaluated in Emergency Department on 12/05/2020 for the symptoms described in the history of present illness. He was evaluated in the context of the global COVID-19 pandemic, which necessitated consideration that the patient might be at risk for infection with the SARS-CoV-2 virus that causes COVID-19. Institutional protocols and algorithms that pertain to the evaluation of patients at risk for COVID-19 are in a state of rapid change based on information released by regulatory bodies including the CDC and federal and state organizations. These policies and algorithms were followed during the patient's care in the ED.  Some ED evaluations and interventions may be delayed as a result of limited staffing during and the pandemic.*   Note:  This document was prepared using Dragon voice recognition software and may include unintentional dictation errors.   Lucy Chris, PA 12/05/20 3785    Sharyn Creamer, MD 12/09/20 [phone]

## 2020-12-19 ENCOUNTER — Other Ambulatory Visit: Payer: Self-pay | Admitting: Psychiatry

## 2020-12-20 ENCOUNTER — Encounter: Payer: Self-pay | Admitting: Certified Registered Nurse Anesthetist

## 2020-12-20 ENCOUNTER — Other Ambulatory Visit: Payer: Self-pay

## 2020-12-20 ENCOUNTER — Encounter
Admission: RE | Admit: 2020-12-20 | Discharge: 2020-12-20 | Disposition: A | Discharge status: Home / Self Care | Payer: Medicare HMO | Source: Ambulatory Visit | Attending: Psychiatry | Admitting: Psychiatry

## 2020-12-20 DIAGNOSIS — F250 Schizoaffective disorder, bipolar type: Secondary | ICD-10-CM | POA: Insufficient documentation

## 2020-12-20 MED ORDER — SUCCINYLCHOLINE CHLORIDE 20 MG/ML IJ SOLN
INTRAMUSCULAR | Status: DC | PRN
  Administered 2020-12-20: 100 mg via INTRAVENOUS

## 2020-12-20 MED ORDER — GLYCOPYRROLATE 0.2 MG/ML IJ SOLN: 0.3000 mg | Freq: Once | INTRAMUSCULAR | Status: DC

## 2020-12-20 MED ORDER — MIDAZOLAM HCL 2 MG/2ML IJ SOLN
INTRAMUSCULAR | Status: DC | PRN
  Administered 2020-12-20: 2 mg via INTRAVENOUS

## 2020-12-20 MED ORDER — LABETALOL HCL 5 MG/ML IV SOLN
INTRAVENOUS | Status: DC | PRN
  Administered 2020-12-20: 10 mg via INTRAVENOUS

## 2020-12-20 MED ORDER — SODIUM CHLORIDE 0.9 % IV SOLN: 500.0000 mL | Freq: Once | INTRAVENOUS | Status: AC

## 2020-12-20 MED ORDER — MIDAZOLAM HCL 2 MG/2ML IJ SOLN
INTRAMUSCULAR | Status: AC
  Filled 2020-12-20: qty 2

## 2020-12-20 MED ORDER — METHOHEXITAL SODIUM 100 MG/10ML IV SOSY
PREFILLED_SYRINGE | INTRAVENOUS | Status: DC | PRN
  Administered 2020-12-20: 70 mg via INTRAVENOUS

## 2020-12-20 NOTE — Anesthesia Postprocedure Evaluation (Signed)
Anesthesia Post Note  Patient: Jonathan Alexander.  Procedure(s) Performed: ECT TX  Patient location during evaluation: PACU Anesthesia Type: General Level of consciousness: awake and alert Pain management: pain level controlled Vital Signs Assessment: post-procedure vital signs reviewed and stable Respiratory status: spontaneous breathing, nonlabored ventilation and respiratory function stable Cardiovascular status: blood pressure returned to baseline and stable Postop Assessment: no signs of nausea or vomiting Anesthetic complications: no   No complications documented.   Last Vitals:  Vitals:   12/20/20 1335 12/20/20 1346  BP:  117/76  Pulse: 96 99  Resp: 18 19  Temp: 36.6 C   SpO2: 100% 100%    Last Pain:  Vitals:   12/20/20 1335  TempSrc:   PainSc: Asleep                 Sultana Tierney

## 2020-12-20 NOTE — H&P (Signed)
Jonathan Alexander. is an 35 y.o. male.   Chief Complaint: Patient has no complaints HPI: Recurrent schizoaffective disorder  Past Medical History:  Diagnosis Date  . Schizophrenia (HCC)   . Tachycardia     History reviewed. No pertinent surgical history.  Family History  Family history unknown: Yes   Social History:  reports that he has been smoking cigarettes. He has a 4.00 pack-year smoking history. He has never used smokeless tobacco. He reports that he does not drink alcohol and does not use drugs.  Allergies:  Allergies  Allergen Reactions  . Divalproex Sodium   . Shellfish-Derived Products     (Not in a hospital admission)   No results found for this or any previous visit (from the past 48 hour(s)). No results found.  Review of Systems  Constitutional: Negative.   HENT: Negative.   Eyes: Negative.   Respiratory: Negative.   Cardiovascular: Negative.   Gastrointestinal: Negative.   Musculoskeletal: Negative.   Skin: Negative.   Neurological: Negative.   Psychiatric/Behavioral: Negative.     Blood pressure 106/75, pulse 98, temperature 98.2 F (36.8 C), temperature source Oral, resp. rate 16, height 5\' 10"  (1.778 m), weight 99.3 kg, SpO2 98 %. Physical Exam Vitals and nursing note reviewed.  Constitutional:      Appearance: He is well-developed.  HENT:     Head: Normocephalic and atraumatic.  Eyes:     Conjunctiva/sclera: Conjunctivae normal.     Pupils: Pupils are equal, round, and reactive to light.  Cardiovascular:     Heart sounds: Normal heart sounds.  Pulmonary:     Effort: Pulmonary effort is normal.  Abdominal:     Palpations: Abdomen is soft.  Musculoskeletal:        General: Normal range of motion.     Cervical back: Normal range of motion.  Skin:    General: Skin is warm and dry.  Neurological:     General: No focal deficit present.     Mental Status: He is alert.  Psychiatric:        Mood and Affect: Mood normal.       Assessment/Plan Treatment today follow-up 6 weeks  , MD 12/20/2020, 7:38 PM

## 2020-12-20 NOTE — Anesthesia Preprocedure Evaluation (Signed)
Anesthesia Evaluation  Patient identified by MRN, date of birth, ID band Patient awake    Reviewed: Allergy & Precautions, NPO status , Patient's Chart, lab work & pertinent test results, reviewed documented beta blocker date and time   History of Anesthesia Complications Negative for: history of anesthetic complications  Airway Mallampati: II  TM Distance: >3 FB     Dental  (+) Chipped   Pulmonary neg sleep apnea, neg COPD, Current Smoker and Patient abstained from smoking., former smoker,    breath sounds clear to auscultation- rhonchi (-) wheezing      Cardiovascular Exercise Tolerance: Good hypertension, (-) CAD, (-) Past MI and (-) Cardiac Stents  Rhythm:Regular Rate:Normal - Systolic murmurs and - Diastolic murmurs    Neuro/Psych PSYCHIATRIC DISORDERS Bipolar Disorder Schizophrenia negative neurological ROS     GI/Hepatic negative GI ROS, Neg liver ROS,   Endo/Other  diabetes, Oral Hypoglycemic Agents  Renal/GU negative Renal ROS     Musculoskeletal negative musculoskeletal ROS (+)   Abdominal (+) + obese,   Peds  Hematology negative hematology ROS (+)   Anesthesia Other Findings Past Medical History: No date: Schizophrenia (HCC) No date: Tachycardia    Reproductive/Obstetrics                             Anesthesia Physical  Anesthesia Plan  ASA: III  Anesthesia Plan: General   Post-op Pain Management:    Induction: Intravenous  PONV Risk Score and Plan: 2 and Ondansetron  Airway Management Planned: Mask  Additional Equipment:   Intra-op Plan:   Post-operative Plan:   Informed Consent: I have reviewed the patients History and Physical, chart, labs and discussed the procedure including the risks, benefits and alternatives for the proposed anesthesia with the patient or authorized representative who has indicated his/her understanding and acceptance.       Plan  Discussed with: CRNA and Anesthesiologist  Anesthesia Plan Comments: (Ate apple pie at 0330, plan to start at 1130)        Anesthesia Quick Evaluation

## 2020-12-20 NOTE — Anesthesia Procedure Notes (Signed)
Performed by: Deondrick Searls, CRNA Pre-anesthesia Checklist: Patient identified, Patient being monitored, Timeout performed, Emergency Drugs available and Suction available Patient Re-evaluated:Patient Re-evaluated prior to induction Oxygen Delivery Method: Circle system utilized Preoxygenation: Pre-oxygenation with 100% oxygen Ventilation: Mask ventilation without difficulty Airway Equipment and Method: Bite block Dental Injury: Teeth and Oropharynx as per pre-operative assessment        

## 2020-12-20 NOTE — Transfer of Care (Signed)
Immediate Anesthesia Transfer of Care Note  Patient: Jonathan Alexander.  Procedure(s) Performed: ECT TX  Patient Location: PACU  Anesthesia Type:General  Level of Consciousness: drowsy  Airway & Oxygen Therapy: Patient Spontanous Breathing and Patient connected to face mask oxygen  Post-op Assessment: Report given to RN and Post -op Vital signs reviewed and stable  Post vital signs: Reviewed and stable  Last Vitals:  Vitals Value Taken Time  BP    Temp    Pulse 103 12/20/20 1335  Resp 19 12/20/20 1335  SpO2 100 % 12/20/20 1335  Vitals shown include unvalidated device data.  Last Pain:  Vitals:   12/20/20 1152  TempSrc:   PainSc: 0-No pain         Complications: No complications documented.

## 2020-12-20 NOTE — Procedures (Signed)
ECT SERVICES Physician's Interval Evaluation & Treatment Note  Patient Identification: Jonathan Alexander. MRN:  [id number] Date of Evaluation:  12/20/2020 TX #: [id number]  MADRS:   MMSE:   P.E. Findings:  No change to physical  Psychiatric Interval Note:  Slightly odd and euphoric but not aggressive and no expression of dangerous ideation  Subjective:  Patient is a 34 y.o. male seen for evaluation for Electroconvulsive Therapy. Patient has no complaints  Treatment Summary:   []   Right Unilateral             [x]  Bilateral   % Energy : 1.0 ms 100%   Impedance: 1500 ohms  Seizure Energy Index: 14,364 V squared  Postictal Suppression Index: 88%  Seizure Concordance Index: 97%  Medications  Pre Shock: Robinul 0.2 mg ketamine 80 mg Brevital 70 mg succinylcholine 100 mg  Post Shock: Versed 2 mg  Seizure Duration: 18 seconds EMG 27 seconds EEG   Comments: Follow-up 6 weeks  Lungs:  [x]   Clear to auscultation               []  Other:   Heart:    [x]   Regular rhythm             []  irregular rhythm    [x]   Previous H&P reviewed, patient examined and there are NO CHANGES                 []   Previous H&P reviewed, patient examined and there are changes noted.   , MD 5/18/20227:43 PM

## 2021-02-14 ENCOUNTER — Other Ambulatory Visit: Payer: Self-pay | Admitting: Psychiatry

## 2021-02-14 ENCOUNTER — Other Ambulatory Visit: Payer: Self-pay

## 2021-02-14 ENCOUNTER — Encounter
Admission: RE | Admit: 2021-02-14 | Discharge: 2021-02-14 | Disposition: A | Discharge status: Home / Self Care | Payer: Medicare HMO | Source: Ambulatory Visit | Attending: Psychiatry | Admitting: Psychiatry

## 2021-02-14 ENCOUNTER — Encounter: Payer: Self-pay | Admitting: Registered Nurse

## 2021-02-14 ENCOUNTER — Ambulatory Visit: Payer: Self-pay | Admitting: Registered Nurse

## 2021-02-14 DIAGNOSIS — F319 Bipolar disorder, unspecified: Secondary | ICD-10-CM | POA: Insufficient documentation

## 2021-02-14 DIAGNOSIS — F259 Schizoaffective disorder, unspecified: Secondary | ICD-10-CM | POA: Diagnosis present

## 2021-02-14 DIAGNOSIS — F250 Schizoaffective disorder, bipolar type: Secondary | ICD-10-CM

## 2021-02-14 DIAGNOSIS — F17210 Nicotine dependence, cigarettes, uncomplicated: Secondary | ICD-10-CM | POA: Diagnosis not present

## 2021-02-14 DIAGNOSIS — F203 Undifferentiated schizophrenia: Secondary | ICD-10-CM | POA: Diagnosis not present

## 2021-02-14 MED ORDER — METHOHEXITAL SODIUM 100 MG/10ML IV SOSY
PREFILLED_SYRINGE | INTRAVENOUS | Status: DC | PRN
  Administered 2021-02-14: 70 mg via INTRAVENOUS

## 2021-02-14 MED ORDER — GLYCOPYRROLATE 0.2 MG/ML IJ SOLN
INTRAMUSCULAR | Status: AC
  Filled 2021-02-14: qty 1

## 2021-02-14 MED ORDER — LABETALOL HCL 5 MG/ML IV SOLN
INTRAVENOUS | Status: AC
  Filled 2021-02-14: qty 4

## 2021-02-14 MED ORDER — FENTANYL CITRATE (PF) 100 MCG/2ML IJ SOLN: 25.0000 ug | INTRAMUSCULAR | Status: DC | PRN

## 2021-02-14 MED ORDER — LABETALOL HCL 5 MG/ML IV SOLN
INTRAVENOUS | Status: DC | PRN
  Administered 2021-02-14: 10 mg via INTRAVENOUS

## 2021-02-14 MED ORDER — GLYCOPYRROLATE 0.2 MG/ML IJ SOLN
0.2000 mg | Freq: Once | INTRAMUSCULAR | Status: AC
  Administered 2021-02-14: 0.2 mg via INTRAVENOUS

## 2021-02-14 MED ORDER — MIDAZOLAM HCL 2 MG/2ML IJ SOLN
4.0000 mg | Freq: Once | INTRAMUSCULAR | Status: AC
  Administered 2021-02-14: 2 mg via INTRAVENOUS

## 2021-02-14 MED ORDER — SODIUM CHLORIDE 0.9 % IV SOLN: INTRAVENOUS | Status: DC | PRN

## 2021-02-14 MED ORDER — MEPERIDINE HCL 25 MG/ML IJ SOLN: 6.2500 mg | INTRAMUSCULAR | Status: DC | PRN

## 2021-02-14 MED ORDER — MIDAZOLAM HCL 2 MG/2ML IJ SOLN
INTRAMUSCULAR | Status: AC
  Filled 2021-02-14: qty 2

## 2021-02-14 MED ORDER — ONDANSETRON HCL 4 MG/2ML IJ SOLN: 4.0000 mg | Freq: Once | INTRAMUSCULAR | Status: DC | PRN

## 2021-02-14 MED ORDER — SUCCINYLCHOLINE CHLORIDE 20 MG/ML IJ SOLN
INTRAMUSCULAR | Status: DC | PRN
  Administered 2021-02-14: 100 mg via INTRAVENOUS

## 2021-02-14 MED ORDER — SUCCINYLCHOLINE CHLORIDE 200 MG/10ML IV SOSY
PREFILLED_SYRINGE | INTRAVENOUS | Status: AC
  Filled 2021-02-14: qty 10

## 2021-02-14 MED ORDER — SODIUM CHLORIDE 0.9 % IV SOLN
500.0000 mL | Freq: Once | INTRAVENOUS | Status: AC
  Administered 2021-02-14: 500 mL via INTRAVENOUS

## 2021-02-14 NOTE — Transfer of Care (Signed)
Immediate Anesthesia Transfer of Care Note  Patient: Jonathan Alexander.  Procedure(s) Performed: ECT TX  Patient Location: PACU  Anesthesia Type:General  Level of Consciousness: responds to stimulation and obtunded  Airway & Oxygen Therapy: Patient Spontanous Breathing  Post-op Assessment: Report given to RN and Post -op Vital signs reviewed and stable  Post vital signs: Reviewed and stable  Last Vitals:  Vitals Value Taken Time  BP 130/84 02/14/21 1344  Temp 36.5 C 02/14/21 1342  Pulse 97 02/14/21 1346  Resp 13 02/14/21 1346  SpO2 96 % 02/14/21 1346  Vitals shown include unvalidated device data.  Last Pain:  Vitals:   02/14/21 1342  TempSrc:   PainSc: Asleep         Complications: No notable events documented.

## 2021-02-14 NOTE — Anesthesia Preprocedure Evaluation (Signed)
Anesthesia Evaluation  Patient identified by MRN, date of birth, ID band Patient awake    Reviewed: Allergy & Precautions, NPO status , Patient's Chart, lab work & pertinent test results, reviewed documented beta blocker date and time   History of Anesthesia Complications Negative for: history of anesthetic complications  Airway Mallampati: II  TM Distance: >3 FB     Dental  (+) Chipped   Pulmonary neg sleep apnea, neg COPD, Current Smoker and Patient abstained from smoking., former smoker,    breath sounds clear to auscultation- rhonchi (-) wheezing      Cardiovascular Exercise Tolerance: Good hypertension, (-) CAD, (-) Past MI and (-) Cardiac Stents  Rhythm:Regular Rate:Normal - Systolic murmurs and - Diastolic murmurs    Neuro/Psych PSYCHIATRIC DISORDERS Bipolar Disorder Schizophrenia negative neurological ROS     GI/Hepatic negative GI ROS, Neg liver ROS,   Endo/Other  diabetes, Oral Hypoglycemic Agents  Renal/GU negative Renal ROS     Musculoskeletal negative musculoskeletal ROS (+)   Abdominal (+) + obese,   Peds  Hematology negative hematology ROS (+)   Anesthesia Other Findings Past Medical History: No date: Schizophrenia (HCC) No date: Tachycardia    Reproductive/Obstetrics                             Anesthesia Physical  Anesthesia Plan  ASA: 3  Anesthesia Plan: General   Post-op Pain Management:    Induction: Intravenous  PONV Risk Score and Plan: 2 and Ondansetron  Airway Management Planned: Mask  Additional Equipment:   Intra-op Plan:   Post-operative Plan:   Informed Consent: I have reviewed the patients History and Physical, chart, labs and discussed the procedure including the risks, benefits and alternatives for the proposed anesthesia with the patient or authorized representative who has indicated his/her understanding and acceptance.       Plan  Discussed with: CRNA and Anesthesiologist  Anesthesia Plan Comments: (Ate apple pie at 0330, plan to start at 1130)        Anesthesia Quick Evaluation

## 2021-02-14 NOTE — Anesthesia Postprocedure Evaluation (Signed)
Anesthesia Post Note  Patient: Jonathan Alexander.  Procedure(s) Performed: ECT TX  Patient location during evaluation: PACU Anesthesia Type: General Level of consciousness: awake and alert, awake and oriented Pain management: pain level controlled Vital Signs Assessment: post-procedure vital signs reviewed and stable Respiratory status: spontaneous breathing, nonlabored ventilation and respiratory function stable Cardiovascular status: blood pressure returned to baseline and stable Postop Assessment: no apparent nausea or vomiting Anesthetic complications: no   No notable events documented.   Last Vitals:  Vitals:   02/14/21 1405 02/14/21 1416  BP: 131/72 130/70  Pulse: 96 95  Resp: 20 18  Temp: 36.5 C 36.6 C  SpO2:      Last Pain:  Vitals:   02/14/21 1416  TempSrc: Oral  PainSc: 0-No pain                 Manfred Arch

## 2021-02-14 NOTE — Procedures (Signed)
ECT SERVICES Physician's Interval Evaluation & Treatment Note  Patient Identification: Jonathan Alexander. MRN:  [id number] Date of Evaluation:  02/14/2021 TX #: 55  MADRS:   MMSE:   P.E. Findings:  No change to physical exam  Psychiatric Interval Note:  Stable  Subjective:  Patient is a 35 y.o. male seen for evaluation for Electroconvulsive Therapy. No complaints  Treatment Summary:   []   Right Unilateral             [x]  Bilateral   % Energy : 1.0 ms 100%   Impedance: 2620 on  Seizure Energy Index: 15,130 V squared  Postictal Suppression Index: 88%  Seizure Concordance Index: 97%  Medications  Pre Shock: Robinul 0.2 mg ketamine 80 mg succinylcholine 100 mg  Post Shock: Versed 2 mg  Seizure Duration: EMG 24 seconds EEG 27 seconds   Comments: Well-tolerated.  Return in 6 weeks  Lungs:  [x]   Clear to auscultation               []  Other:   Heart:    [x]   Regular rhythm             []  irregular rhythm    [x]   Previous H&P reviewed, patient examined and there are NO CHANGES                 []   Previous H&P reviewed, patient examined and there are changes noted.   , MD 7/13/20226:24 PM

## 2021-02-14 NOTE — H&P (Signed)
Jonathan Alexander. is an 35 y.o. male.   Chief Complaint: Patient has no chief complaint HPI: History of schizoaffective disorder with control partially maintained by ECT  Past Medical History:  Diagnosis Date   Schizophrenia (HCC)    Tachycardia     History reviewed. No pertinent surgical history.  Family History  Family history unknown: Yes   Social History:  reports that he has been smoking cigarettes. He has a 4.00 pack-year smoking history. He has never used smokeless tobacco. He reports that he does not drink alcohol and does not use drugs.  Allergies:  Allergies  Allergen Reactions   Divalproex Sodium    Shellfish-Derived Products     (Not in a hospital admission)   No results found for this or any previous visit (from the past 48 hour(s)). No results found.  Review of Systems  Constitutional: Negative.   HENT: Negative.    Eyes: Negative.   Respiratory: Negative.    Cardiovascular: Negative.   Gastrointestinal: Negative.   Musculoskeletal: Negative.   Skin: Negative.   Neurological: Negative.   Psychiatric/Behavioral: Negative.     Blood pressure 130/70, pulse 95, temperature 97.8 F (36.6 C), temperature source Oral, resp. rate 18, height 6\' 2"  (1.88 m), weight 99.3 kg, SpO2 96 %. Physical Exam Constitutional:      Appearance: He is well-developed.  HENT:     Head: Normocephalic and atraumatic.  Eyes:     Conjunctiva/sclera: Conjunctivae normal.     Pupils: Pupils are equal, round, and reactive to light.  Cardiovascular:     Heart sounds: Normal heart sounds.  Pulmonary:     Effort: Pulmonary effort is normal.  Abdominal:     Palpations: Abdomen is soft.  Musculoskeletal:        General: Normal range of motion.     Cervical back: Normal range of motion.  Skin:    General: Skin is warm and dry.  Neurological:     General: No focal deficit present.     Mental Status: He is alert.  Psychiatric:        Mood and Affect: Mood normal.      Assessment/Plan Treatment today follow-up 6 weeks as usual schedule.  , MD 02/14/2021, 6:22 PM

## 2021-04-10 ENCOUNTER — Other Ambulatory Visit: Payer: Self-pay | Admitting: Psychiatry

## 2021-04-11 ENCOUNTER — Encounter: Payer: Self-pay | Admitting: Anesthesiology

## 2021-04-11 ENCOUNTER — Other Ambulatory Visit: Payer: Self-pay

## 2021-04-11 ENCOUNTER — Ambulatory Visit: Payer: Self-pay | Admitting: Anesthesiology

## 2021-04-11 ENCOUNTER — Encounter
Admission: RE | Admit: 2021-04-11 | Discharge: 2021-04-11 | Disposition: A | Discharge status: Home / Self Care | Payer: Medicare HMO | Source: Ambulatory Visit | Attending: Psychiatry | Admitting: Psychiatry

## 2021-04-11 DIAGNOSIS — F17210 Nicotine dependence, cigarettes, uncomplicated: Secondary | ICD-10-CM | POA: Diagnosis not present

## 2021-04-11 DIAGNOSIS — F250 Schizoaffective disorder, bipolar type: Secondary | ICD-10-CM | POA: Diagnosis not present

## 2021-04-11 DIAGNOSIS — F259 Schizoaffective disorder, unspecified: Secondary | ICD-10-CM | POA: Diagnosis present

## 2021-04-11 MED ORDER — SODIUM CHLORIDE 0.9 % IV SOLN
500.0000 mL | Freq: Once | INTRAVENOUS | Status: AC
  Administered 2021-04-11: 500 mL via INTRAVENOUS

## 2021-04-11 MED ORDER — MIDAZOLAM HCL 2 MG/2ML IJ SOLN
INTRAMUSCULAR | Status: DC | PRN
  Administered 2021-04-11: 2 mg via INTRAVENOUS

## 2021-04-11 MED ORDER — SUCCINYLCHOLINE CHLORIDE 200 MG/10ML IV SOSY
PREFILLED_SYRINGE | INTRAVENOUS | Status: DC | PRN
  Administered 2021-04-11: 100 mg via INTRAVENOUS

## 2021-04-11 MED ORDER — KETOROLAC TROMETHAMINE 30 MG/ML IJ SOLN
INTRAMUSCULAR | Status: AC
  Administered 2021-04-11: 30 mg via INTRAVENOUS
  Filled 2021-04-11: qty 1

## 2021-04-11 MED ORDER — SODIUM CHLORIDE 0.9 % IV SOLN: INTRAVENOUS | Status: DC | PRN

## 2021-04-11 MED ORDER — METHOHEXITAL SODIUM 100 MG/10ML IV SOSY
PREFILLED_SYRINGE | INTRAVENOUS | Status: DC | PRN
Start: 2021-04-11 — End: 2021-04-11
  Administered 2021-04-11: 70 mg via INTRAVENOUS

## 2021-04-11 MED ORDER — GLYCOPYRROLATE 0.2 MG/ML IJ SOLN
INTRAMUSCULAR | Status: AC
  Administered 2021-04-11: 0.2 mg via INTRAVENOUS
  Filled 2021-04-11: qty 1

## 2021-04-11 MED ORDER — LABETALOL HCL 5 MG/ML IV SOLN
INTRAVENOUS | Status: DC | PRN
  Administered 2021-04-11: 10 mg via INTRAVENOUS

## 2021-04-11 MED ORDER — KETOROLAC TROMETHAMINE 30 MG/ML IJ SOLN: 30.0000 mg | Freq: Once | INTRAMUSCULAR | Status: AC

## 2021-04-11 MED ORDER — GLYCOPYRROLATE 0.2 MG/ML IJ SOLN: 0.2000 mg | Freq: Once | INTRAMUSCULAR | Status: AC

## 2021-04-11 MED ORDER — MIDAZOLAM HCL 2 MG/2ML IJ SOLN
INTRAMUSCULAR | Status: AC
  Filled 2021-04-11: qty 2

## 2021-04-11 NOTE — Anesthesia Postprocedure Evaluation (Signed)
Anesthesia Post Note  Patient: Jonathan Alexander.  Procedure(s) Performed: ECT TX  Patient location during evaluation: PACU Anesthesia Type: General Level of consciousness: awake and alert Pain management: pain level controlled Vital Signs Assessment: post-procedure vital signs reviewed and stable Respiratory status: spontaneous breathing, nonlabored ventilation, respiratory function stable and patient connected to nasal cannula oxygen Cardiovascular status: blood pressure returned to baseline and stable Postop Assessment: no apparent nausea or vomiting Anesthetic complications: no   No notable events documented.   Last Vitals:  Vitals:   04/11/21 1340 04/11/21 1350  BP: 127/77 128/74  Pulse: 96 86  Resp: 16 13  Temp:  36.8 C  SpO2: 98% 100%    Last Pain:  Vitals:   04/11/21 1350  TempSrc:   PainSc: 0-No pain                 Cleda Mccreedy Draper Gallon

## 2021-04-11 NOTE — Anesthesia Preprocedure Evaluation (Signed)
Anesthesia Evaluation  Patient identified by MRN, date of birth, ID band Patient awake    Reviewed: Allergy & Precautions, NPO status , Patient's Chart, lab work & pertinent test results, reviewed documented beta blocker date and time   History of Anesthesia Complications Negative for: history of anesthetic complications  Airway Mallampati: II  TM Distance: >3 FB     Dental  (+) Chipped   Pulmonary neg sleep apnea, neg COPD, Current Smoker and Patient abstained from smoking., former smoker,    Pulmonary exam normal        Cardiovascular Exercise Tolerance: Good hypertension, (-) CAD, (-) Past MI and (-) Cardiac Stents Normal cardiovascular exam - Systolic murmurs    Neuro/Psych PSYCHIATRIC DISORDERS Bipolar Disorder Schizophrenia negative neurological ROS     GI/Hepatic negative GI ROS, Neg liver ROS, neg GERD  ,  Endo/Other  diabetes, Oral Hypoglycemic Agents  Renal/GU negative Renal ROS     Musculoskeletal negative musculoskeletal ROS (+)   Abdominal (+) + obese,   Peds  Hematology negative hematology ROS (+)   Anesthesia Other Findings Past Medical History: No date: Schizophrenia (HCC) No date: Tachycardia    Reproductive/Obstetrics                             Anesthesia Physical  Anesthesia Plan  ASA: 3  Anesthesia Plan: General   Post-op Pain Management:    Induction: Intravenous  PONV Risk Score and Plan: 2 and Ondansetron  Airway Management Planned: Mask  Additional Equipment:   Intra-op Plan:   Post-operative Plan:   Informed Consent: I have reviewed the patients History and Physical, chart, labs and discussed the procedure including the risks, benefits and alternatives for the proposed anesthesia with the patient or authorized representative who has indicated his/her understanding and acceptance.     Dental Advisory Given  Plan Discussed with: CRNA and  Anesthesiologist  Anesthesia Plan Comments: (Patient consented for risks of anesthesia including but not limited to:  - adverse reactions to medications - risk of airway placement if required - damage to eyes, teeth, lips or other oral mucosa - nerve damage due to positioning  - sore throat or hoarseness - Damage to heart, brain, nerves, lungs, other parts of body or loss of life  Patient voiced understanding.)        Anesthesia Quick Evaluation

## 2021-04-11 NOTE — Transfer of Care (Signed)
Immediate Anesthesia Transfer of Care Note  Patient: Jonathan Alexander.  Procedure(s) Performed: ECT TX  Patient Location: PACU  Anesthesia Type:General  Level of Consciousness: drowsy  Airway & Oxygen Therapy: Patient Spontanous Breathing  Post-op Assessment: Report given to RN  Post vital signs: stable  Last Vitals:  Vitals Value Taken Time  BP    Temp    Pulse    Resp    SpO2      Last Pain:  Vitals:   04/11/21 1121  TempSrc:   PainSc: 0-No pain         Complications: No notable events documented.

## 2021-04-12 NOTE — Procedures (Signed)
ECT SERVICES Physician's Interval Evaluation & Treatment Note  Patient Identification: Jonathan Alexander. MRN:  [id number] Date of Evaluation:  04/12/2021 TX #: 56  MADRS:   MMSE:   P.E. Findings:  No change to physical exam  Psychiatric Interval Note:  Blunted affect but no complaints with no reports of major mood swings  Subjective:  Patient is a 35 y.o. male seen for evaluation for Electroconvulsive Therapy. No complaint  Treatment Summary:   []   Right Unilateral             [x]  Bilateral   % Energy : 1.0 ms 100%   Impedance: 1890 ohms  Seizure Energy Index: 15,961 V squared  Postictal Suppression Index: No reading  Seizure Concordance Index: No reading  Medications  Pre Shock: Robinul 0.2 mg ketamine 80 mg Brevital 70 mg succinylcholine 100 mg  Post Shock: Versed 2 mg  Seizure Duration: 27 seconds EMG 27 seconds EEG   Comments: Well-tolerated follow-up 6 weeks  Lungs:  [x]   Clear to auscultation               []  Other:   Heart:    [x]   Regular rhythm             []  irregular rhythm    [x]   Previous H&P reviewed, patient examined and there are NO CHANGES                 []   Previous H&P reviewed, patient examined and there are changes noted.   , MD 9/8/20225:27 PM

## 2021-04-12 NOTE — H&P (Signed)
Jonathan Alexander. is an 35 y.o. male.   Chief Complaint: Patient has no new complaint HPI: Schizoaffective disorder recurrent.  Patient has no complaints denies depression denies suicidal thought denies hallucinations  Past Medical History:  Diagnosis Date   Schizophrenia (HCC)    Tachycardia     History reviewed. No pertinent surgical history.  Family History  Family history unknown: Yes   Social History:  reports that he has been smoking cigarettes. He has a 4.00 pack-year smoking history. He has never used smokeless tobacco. He reports that he does not drink alcohol and does not use drugs.  Allergies:  Allergies  Allergen Reactions   Divalproex Sodium    Shellfish-Derived Products     (Not in a hospital admission)   No results found for this or any previous visit (from the past 48 hour(s)). No results found.  Review of Systems  Constitutional: Negative.   HENT: Negative.    Eyes: Negative.   Respiratory: Negative.    Cardiovascular: Negative.   Gastrointestinal: Negative.   Musculoskeletal: Negative.   Skin: Negative.   Neurological: Negative.   Psychiatric/Behavioral: Negative.     Blood pressure 124/85, pulse 98, temperature 98.2 F (36.8 C), temperature source Oral, resp. rate 18, height 5\' 11"  (1.803 m), weight 99.8 kg, SpO2 100 %. Physical Exam Vitals and nursing note reviewed.  Constitutional:      Appearance: He is well-developed.  HENT:     Head: Normocephalic and atraumatic.  Eyes:     Conjunctiva/sclera: Conjunctivae normal.     Pupils: Pupils are equal, round, and reactive to light.  Cardiovascular:     Heart sounds: Normal heart sounds.  Pulmonary:     Effort: Pulmonary effort is normal.  Abdominal:     Palpations: Abdomen is soft.  Musculoskeletal:        General: Normal range of motion.     Cervical back: Normal range of motion.  Skin:    General: Skin is warm and dry.  Neurological:     General: No focal deficit present.      Mental Status: He is alert.  Psychiatric:        Attention and Perception: He is inattentive.        Mood and Affect: Affect is blunt.        Speech: Speech is delayed.        Behavior: Behavior is slowed.     Assessment/Plan Treatment today and continue 6 to 8-week follow-up  , MD 04/12/2021, 5:15 PM

## 2021-06-06 ENCOUNTER — Encounter
Admission: RE | Admit: 2021-06-06 | Discharge: 2021-06-06 | Disposition: A | Discharge status: Home / Self Care | Payer: Medicare HMO | Source: Ambulatory Visit | Attending: Psychiatry | Admitting: Psychiatry

## 2021-06-06 ENCOUNTER — Encounter: Payer: Self-pay | Admitting: Anesthesiology

## 2021-06-06 ENCOUNTER — Other Ambulatory Visit: Payer: Self-pay | Admitting: Psychiatry

## 2021-06-06 ENCOUNTER — Ambulatory Visit: Payer: Self-pay | Admitting: Anesthesiology

## 2021-06-06 ENCOUNTER — Other Ambulatory Visit: Payer: Self-pay

## 2021-06-06 DIAGNOSIS — F250 Schizoaffective disorder, bipolar type: Secondary | ICD-10-CM

## 2021-06-06 DIAGNOSIS — F259 Schizoaffective disorder, unspecified: Secondary | ICD-10-CM | POA: Insufficient documentation

## 2021-06-06 MED ORDER — SUCCINYLCHOLINE CHLORIDE 200 MG/10ML IV SOSY
PREFILLED_SYRINGE | INTRAVENOUS | Status: DC | PRN
  Administered 2021-06-06: 100 mg via INTRAVENOUS

## 2021-06-06 MED ORDER — MIDAZOLAM HCL 2 MG/2ML IJ SOLN: 2.0000 mg | Freq: Once | INTRAMUSCULAR | Status: DC

## 2021-06-06 MED ORDER — MIDAZOLAM HCL 2 MG/2ML IJ SOLN
INTRAMUSCULAR | Status: AC
  Filled 2021-06-06: qty 2

## 2021-06-06 MED ORDER — SODIUM CHLORIDE 0.9 % IV SOLN: 500.0000 mL | Freq: Once | INTRAVENOUS | Status: DC

## 2021-06-06 MED ORDER — LABETALOL HCL 5 MG/ML IV SOLN
INTRAVENOUS | Status: DC | PRN
  Administered 2021-06-06: 10 mg via INTRAVENOUS

## 2021-06-06 MED ORDER — METHOHEXITAL SODIUM 100 MG/10ML IV SOSY
PREFILLED_SYRINGE | INTRAVENOUS | Status: DC | PRN
  Administered 2021-06-06: 70 mg via INTRAVENOUS

## 2021-06-06 MED ORDER — GLYCOPYRROLATE 0.2 MG/ML IJ SOLN: 0.2000 mg | Freq: Once | INTRAMUSCULAR | Status: DC

## 2021-06-06 MED ORDER — SODIUM CHLORIDE 0.9 % IV SOLN: INTRAVENOUS | Status: DC | PRN

## 2021-06-06 MED ORDER — MIDAZOLAM HCL 2 MG/2ML IJ SOLN
INTRAMUSCULAR | Status: DC | PRN
  Administered 2021-06-06: 2 mg via INTRAVENOUS

## 2021-06-06 MED ORDER — LABETALOL HCL 5 MG/ML IV SOLN
INTRAVENOUS | Status: AC
  Filled 2021-06-06: qty 4

## 2021-06-06 NOTE — Anesthesia Preprocedure Evaluation (Signed)
Anesthesia Evaluation  Patient identified by MRN, date of birth, ID band Patient awake    Reviewed: Allergy & Precautions, NPO status , Patient's Chart, lab work & pertinent test results, reviewed documented beta blocker date and time   History of Anesthesia Complications Negative for: history of anesthetic complications  Airway Mallampati: II  TM Distance: >3 FB     Dental  (+) Chipped   Pulmonary neg sleep apnea, neg COPD, Current Smoker and Patient abstained from smoking., former smoker,    breath sounds clear to auscultation- rhonchi (-) wheezing      Cardiovascular Exercise Tolerance: Good hypertension, (-) CAD, (-) Past MI and (-) Cardiac Stents  Rhythm:Regular Rate:Normal - Systolic murmurs and - Diastolic murmurs    Neuro/Psych PSYCHIATRIC DISORDERS Bipolar Disorder Schizophrenia negative neurological ROS     GI/Hepatic negative GI ROS, Neg liver ROS,   Endo/Other  diabetes, Oral Hypoglycemic Agents  Renal/GU negative Renal ROS     Musculoskeletal negative musculoskeletal ROS (+)   Abdominal (+) + obese,   Peds  Hematology negative hematology ROS (+)   Anesthesia Other Findings Past Medical History: No date: Schizophrenia (HCC) No date: Tachycardia    Reproductive/Obstetrics                             Anesthesia Physical  Anesthesia Plan  ASA: III  Anesthesia Plan: General   Post-op Pain Management:    Induction: Intravenous  PONV Risk Score and Plan: 2 and Ondansetron  Airway Management Planned: Mask  Additional Equipment:   Intra-op Plan:   Post-operative Plan:   Informed Consent: I have reviewed the patients History and Physical, chart, labs and discussed the procedure including the risks, benefits and alternatives for the proposed anesthesia with the patient or authorized representative who has indicated his/her understanding and acceptance.       Plan  Discussed with: CRNA and Anesthesiologist  Anesthesia Plan Comments: (Ate apple pie at 0330, plan to start at 1130)        Anesthesia Quick Evaluation

## 2021-06-06 NOTE — Anesthesia Postprocedure Evaluation (Signed)
Anesthesia Post Note  Patient: Jonathan Alexander.  Procedure(s) Performed: ECT TX  Patient location during evaluation: PACU Anesthesia Type: General Level of consciousness: awake and alert Pain management: pain level controlled Vital Signs Assessment: post-procedure vital signs reviewed and stable Respiratory status: spontaneous breathing, nonlabored ventilation and respiratory function stable Cardiovascular status: blood pressure returned to baseline and stable Postop Assessment: no signs of nausea or vomiting Anesthetic complications: no   No notable events documented.   Last Vitals:  Vitals:   06/06/21 1410 06/06/21 1420  BP: 112/72 117/79  Pulse: (!) 103 (!) 103  Resp: 19 18  Temp:    SpO2: 97% 99%    Last Pain:  Vitals:   06/06/21 1420  TempSrc:   PainSc: 0-No pain                 Alyona Romack

## 2021-06-06 NOTE — Transfer of Care (Signed)
Immediate Anesthesia Transfer of Care Note  Patient: Jonathan Alexander.  Procedure(s) Performed: ECT TX  Patient Location: PACU  Anesthesia Type:General  Level of Consciousness: drowsy  Airway & Oxygen Therapy: Patient Spontanous Breathing and Patient connected to face mask oxygen  Post-op Assessment: Report given to RN and Post -op Vital signs reviewed and stable  Post vital signs: Reviewed and stable  Last Vitals:  Vitals Value Taken Time  BP    Temp    Pulse 102 06/06/21 1338  Resp 17 06/06/21 1338  SpO2 100 % 06/06/21 1338  Vitals shown include unvalidated device data.  Last Pain:  Vitals:   06/06/21 1133  TempSrc:   PainSc: 0-No pain         Complications: No notable events documented.

## 2021-06-06 NOTE — H&P (Signed)
Jonathan Alexander. is an 35 y.o. male.   Chief Complaint: none HPI: schizoaffective in maintenance phase  Past Medical History:  Diagnosis Date   Schizophrenia (HCC)    Tachycardia     History reviewed. No pertinent surgical history.  Family History  Family history unknown: Yes   Social History:  reports that he has been smoking cigarettes. He has a 4.00 pack-year smoking history. He has never used smokeless tobacco. He reports that he does not drink alcohol and does not use drugs.  Allergies:  Allergies  Allergen Reactions   Divalproex Sodium    Shellfish-Derived Products     (Not in a hospital admission)   No results found for this or any previous visit (from the past 48 hour(s)). No results found.  Review of Systems  Constitutional: Negative.   HENT: Negative.    Eyes: Negative.   Respiratory: Negative.    Cardiovascular: Negative.   Gastrointestinal: Negative.   Musculoskeletal: Negative.   Skin: Negative.   Neurological: Negative.   Psychiatric/Behavioral: Negative.     Blood pressure 127/82, pulse (!) 109, temperature 98.3 F (36.8 C), temperature source Oral, resp. rate 18, height 5\' 9"  (1.753 m), weight 98 kg, SpO2 98 %. Physical Exam Vitals and nursing note reviewed.  Constitutional:      Appearance: He is well-developed.  HENT:     Head: Normocephalic and atraumatic.  Eyes:     Conjunctiva/sclera: Conjunctivae normal.     Pupils: Pupils are equal, round, and reactive to light.  Cardiovascular:     Heart sounds: Normal heart sounds.  Pulmonary:     Effort: Pulmonary effort is normal.  Abdominal:     Palpations: Abdomen is soft.  Musculoskeletal:        General: Normal range of motion.     Cervical back: Normal range of motion.  Skin:    General: Skin is warm and dry.  Neurological:     General: No focal deficit present.     Mental Status: He is alert.  Psychiatric:        Mood and Affect: Mood normal.        Behavior: Behavior normal.      Assessment/Plan Return 8 weeks  , MD 06/06/2021, 12:18 PM

## 2021-06-06 NOTE — Procedures (Signed)
ECT SERVICES Physician's Interval Evaluation & Treatment Note  Patient Identification: Jonathan Alexander. MRN:  [id number] Date of Evaluation:  06/06/2021 TX #: 57  MADRS:   MMSE:   P.E. Findings:  No change to physical exam  Psychiatric Interval Note:  Mood stable  Subjective:  Patient is a 35 y.o. male seen for evaluation for Electroconvulsive Therapy. No complaint  Treatment Summary:   []   Right Unilateral             [x]  Bilateral   % Energy : 1.0 ms 100%   Impedance: 1540 ohms  Seizure Energy Index: 14,181 V squared  Postictal Suppression Index: 80%  Seizure Concordance Index: 97%  Medications  Pre Shock: Robinul 0.2 mg ketamine 80 mg Brevital 70 mg succinylcholine 100 mg  Post Shock: Versed 2 mg  Seizure Duration: 20 seconds EMG 31 seconds EEG   Comments: Follow-up in 8 weeks  Lungs:  [x]   Clear to auscultation               []  Other:   Heart:    [x]   Regular rhythm             []  irregular rhythm    [x]   Previous H&P reviewed, patient examined and there are NO CHANGES                 []   Previous H&P reviewed, patient examined and there are changes noted.   , MD 11/2/20229:16 PM

## 2021-08-14 ENCOUNTER — Other Ambulatory Visit: Payer: Self-pay | Admitting: Psychiatry

## 2021-08-15 ENCOUNTER — Encounter: Payer: Self-pay | Admitting: Certified Registered Nurse Anesthetist

## 2021-08-15 ENCOUNTER — Encounter
Admission: RE | Admit: 2021-08-15 | Discharge: 2021-08-15 | Disposition: A | Discharge status: Home / Self Care | Payer: Medicare HMO | Source: Ambulatory Visit | Attending: Psychiatry | Admitting: Psychiatry

## 2021-08-15 DIAGNOSIS — F259 Schizoaffective disorder, unspecified: Secondary | ICD-10-CM | POA: Diagnosis present

## 2021-08-15 DIAGNOSIS — F17210 Nicotine dependence, cigarettes, uncomplicated: Secondary | ICD-10-CM | POA: Diagnosis not present

## 2021-08-15 DIAGNOSIS — F250 Schizoaffective disorder, bipolar type: Secondary | ICD-10-CM

## 2021-08-15 MED ORDER — GLYCOPYRROLATE 0.2 MG/ML IJ SOLN
INTRAMUSCULAR | Status: AC
  Filled 2021-08-15: qty 1

## 2021-08-15 MED ORDER — MIDAZOLAM HCL 2 MG/2ML IJ SOLN: 2.0000 mg | Freq: Once | INTRAMUSCULAR | Status: DC

## 2021-08-15 MED ORDER — METHOHEXITAL SODIUM 100 MG/10ML IV SOSY
PREFILLED_SYRINGE | INTRAVENOUS | Status: DC | PRN
  Administered 2021-08-15: 70 mg via INTRAVENOUS

## 2021-08-15 MED ORDER — SODIUM CHLORIDE 0.9 % IV SOLN
500.0000 mL | Freq: Once | INTRAVENOUS | Status: AC
  Administered 2021-08-15: 500 mL via INTRAVENOUS

## 2021-08-15 MED ORDER — ACETAMINOPHEN 325 MG PO TABS: 650.0000 mg | ORAL_TABLET | Freq: Once | ORAL | Status: DC | PRN

## 2021-08-15 MED ORDER — SUCCINYLCHOLINE CHLORIDE 200 MG/10ML IV SOSY
PREFILLED_SYRINGE | INTRAVENOUS | Status: AC
  Filled 2021-08-15: qty 10

## 2021-08-15 MED ORDER — LABETALOL HCL 5 MG/ML IV SOLN
INTRAVENOUS | Status: AC
  Filled 2021-08-15: qty 4

## 2021-08-15 MED ORDER — MIDAZOLAM HCL 2 MG/2ML IJ SOLN
INTRAMUSCULAR | Status: AC
  Filled 2021-08-15: qty 2

## 2021-08-15 MED ORDER — MIDAZOLAM HCL 2 MG/2ML IJ SOLN
INTRAMUSCULAR | Status: DC | PRN
  Administered 2021-08-15: 2 mg via INTRAVENOUS

## 2021-08-15 MED ORDER — SUCCINYLCHOLINE CHLORIDE 200 MG/10ML IV SOSY
PREFILLED_SYRINGE | INTRAVENOUS | Status: DC | PRN
  Administered 2021-08-15: 100 mg via INTRAVENOUS

## 2021-08-15 MED ORDER — ACETAMINOPHEN 160 MG/5ML PO SOLN
325.0000 mg | ORAL | Status: DC | PRN
  Filled 2021-08-15: qty 20.3

## 2021-08-15 MED ORDER — LABETALOL HCL 5 MG/ML IV SOLN
INTRAVENOUS | Status: DC | PRN
  Administered 2021-08-15 (×2): 5 mg via INTRAVENOUS

## 2021-08-15 MED ORDER — METHOHEXITAL SODIUM 0.5 G IJ SOLR
INTRAMUSCULAR | Status: AC
  Filled 2021-08-15: qty 500

## 2021-08-15 MED ORDER — ONDANSETRON HCL 4 MG/2ML IJ SOLN: 4.0000 mg | Freq: Once | INTRAMUSCULAR | Status: DC | PRN

## 2021-08-15 MED ORDER — GLYCOPYRROLATE 0.2 MG/ML IJ SOLN
0.2000 mg | Freq: Once | INTRAMUSCULAR | Status: AC
  Administered 2021-08-15: 0.2 mg via INTRAVENOUS

## 2021-08-15 MED ORDER — SODIUM CHLORIDE 0.9 % IV SOLN: INTRAVENOUS | Status: DC | PRN

## 2021-08-15 NOTE — Anesthesia Procedure Notes (Signed)
Procedure Name: General with mask airway Date/Time: 08/15/2021 12:43 PM Performed by: Hezzie Bump, CRNA Pre-anesthesia Checklist: Patient identified, Emergency Drugs available, Suction available and Patient being monitored Patient Re-evaluated:Patient Re-evaluated prior to induction Oxygen Delivery Method: Circle system utilized Preoxygenation: Pre-oxygenation with 100% oxygen Induction Type: IV induction Ventilation: Mask ventilation without difficulty and Mask ventilation throughout procedure Airway Equipment and Method: Bite block Placement Confirmation: positive ETCO2 Dental Injury: Teeth and Oropharynx as per pre-operative assessment

## 2021-08-15 NOTE — Procedures (Signed)
ECT SERVICES Physicians Interval Evaluation & Treatment Note  Patient Identification: Jonathan Alexander. MRN:  [id number] Date of Evaluation:  08/15/2021 TX #: 58  MADRS:   MMSE:   P.E. Findings:  No change to physical exam.  Psychiatric Interval Note:  Mood stable, no evidence of acute psychosis.  Subjective:  Patient is a 36 y.o. male seen for evaluation for Electroconvulsive Therapy. No specific complaint  Treatment Summary:   []   Right Unilateral             [x]  Bilateral   % Energy : 1.0 ms 100%   Impedance: 1520 ohms  Seizure Energy Index: No reading but the patient's seizure was robust and had very good morphology  Postictal Suppression Index: Also no reading but by inspection had good suppression.  Seizure Concordance Index: No reading  Medications  Pre Shock: Robinul 0.2 mg ketamine 80 mg Brevital 70 mg succinylcholine 100 mg  Post Shock: Versed 2 mg  Seizure Duration: 28 seconds by both EMG and EEG   Comments: Return in 8 weeks  Lungs:  [x]   Clear to auscultation               []  Other:   Heart:    [x]   Regular rhythm             []  irregular rhythm    [x]   Previous H&P reviewed, patient examined and there are NO CHANGES                 []   Previous H&P reviewed, patient examined and there are changes noted.   , MD 1/11/20235:32 PM

## 2021-08-15 NOTE — Anesthesia Postprocedure Evaluation (Signed)
Anesthesia Post Note  Patient: Jonathan Alexander.  Procedure(s) Performed: ECT TX  Patient location during evaluation: PACU Anesthesia Type: General Level of consciousness: awake and alert, oriented and patient cooperative Pain management: pain level controlled Vital Signs Assessment: post-procedure vital signs reviewed and stable Respiratory status: spontaneous breathing, nonlabored ventilation and respiratory function stable Cardiovascular status: blood pressure returned to baseline and stable Postop Assessment: adequate PO intake Anesthetic complications: no   No notable events documented.   Last Vitals:  Vitals:   08/15/21 1311 08/15/21 1319  BP: 115/73 114/78  Pulse: 95   Resp: 18   Temp:  36.4 C  SpO2: 98%     Last Pain:  Vitals:   08/15/21 1319  TempSrc:   PainSc: 0-No pain                 Darrin Nipper

## 2021-08-15 NOTE — Transfer of Care (Signed)
Immediate Anesthesia Transfer of Care Note  Patient: Jonathan Alexander.  Procedure(s) Performed: ECT TX  Patient Location: PACU  Anesthesia Type:General  Level of Consciousness: drowsy  Airway & Oxygen Therapy: Patient Spontanous Breathing and Patient connected to nasal cannula oxygen  Post-op Assessment: Report given to RN and Post -op Vital signs reviewed and stable  Post vital signs: Reviewed and stable  Last Vitals:  Vitals Value Taken Time  BP 134/79 08/15/21 1248  Temp    Pulse 105 08/15/21 1249  Resp 19 08/15/21 1249  SpO2 98 % 08/15/21 1249  Vitals shown include unvalidated device data.  Last Pain:  Vitals:   08/15/21 1201  TempSrc: Oral  PainSc: 0-No pain         Complications: No notable events documented.

## 2021-08-15 NOTE — H&P (Signed)
Jonathan Alexander. is an 36 y.o. male.   Chief Complaint: No complaint.  Reports mood is good.  Denies psychotic symptoms. HPI: Schizoaffective disorder recurrent episodes of agitation good response to ECT  Past Medical History:  Diagnosis Date   Schizophrenia ([place])    Tachycardia     History reviewed. No pertinent surgical history.  Family History  Family history unknown: Yes   Social History:  reports that he has been smoking cigarettes. He has a 4.00 pack-year smoking history. He has never used smokeless tobacco. He reports that he does not drink alcohol and does not use drugs.  Allergies:  Allergies  Allergen Reactions   Divalproex Sodium    Shellfish-Derived Products     (Not in a hospital admission)   No results found for this or any previous visit (from the past 48 hour(s)). No results found.  Review of Systems  Constitutional: Negative.   HENT: Negative.    Eyes: Negative.   Respiratory: Negative.    Cardiovascular: Negative.   Gastrointestinal: Negative.   Musculoskeletal: Negative.   Skin: Negative.   Neurological: Negative.   Psychiatric/Behavioral: Negative.     Blood pressure 126/85, pulse (!) 106, temperature 98.2 F (36.8 C), temperature source Oral, resp. rate 18, height 5\' 9"  (1.753 m), weight 98 kg, SpO2 98 %. Physical Exam Vitals and nursing note reviewed.  Constitutional:      Appearance: He is well-developed.  HENT:     Head: Normocephalic and atraumatic.  Eyes:     Conjunctiva/sclera: Conjunctivae normal.     Pupils: Pupils are equal, round, and reactive to light.  Cardiovascular:     Heart sounds: Normal heart sounds.  Pulmonary:     Effort: Pulmonary effort is normal.  Abdominal:     Palpations: Abdomen is soft.  Musculoskeletal:        General: Normal range of motion.     Cervical back: Normal range of motion.  Skin:    General: Skin is warm and dry.  Neurological:     General: No focal deficit present.     Mental Status: He  is alert.  Psychiatric:        Mood and Affect: Mood normal.        Thought Content: Thought content normal.     Assessment/Plan Continue ECT on 6 to 8 weeks schedule.  Alethia Berthold, MD 08/15/2021, 12:30 PM

## 2021-08-15 NOTE — Anesthesia Preprocedure Evaluation (Addendum)
Anesthesia Evaluation  Patient identified by MRN, date of birth, ID band Patient awake    Reviewed: Allergy & Precautions, NPO status , Patient's Chart, lab work & pertinent test results  History of Anesthesia Complications Negative for: history of anesthetic complications  Airway Mallampati: III   Neck ROM: Full    Dental  (+)    Pulmonary Current Smoker (less than 1 ppd) and Patient abstained from smoking.,    Pulmonary exam normal breath sounds clear to auscultation       Cardiovascular hypertension, Normal cardiovascular exam Rhythm:Regular Rate:Normal  ECG 12/05/20:  Sinus tachycardia Nonspecific T wave abnormality   Neuro/Psych PSYCHIATRIC DISORDERS Bipolar Disorder Schizophrenia negative neurological ROS     GI/Hepatic negative GI ROS,   Endo/Other  Obesity; prediabetes  Renal/GU negative Renal ROS     Musculoskeletal   Abdominal   Peds  Hematology negative hematology ROS (+)   Anesthesia Other Findings   Reproductive/Obstetrics                            Anesthesia Physical Anesthesia Plan  ASA: 2  Anesthesia Plan: General   Post-op Pain Management:    Induction: Intravenous  PONV Risk Score and Plan: 1 and TIVA and Treatment may vary due to age or medical condition  Airway Management Planned: Natural Airway  Additional Equipment:   Intra-op Plan:   Post-operative Plan:   Informed Consent: I have reviewed the patients History and Physical, chart, labs and discussed the procedure including the risks, benefits and alternatives for the proposed anesthesia with the patient or authorized representative who has indicated his/her understanding and acceptance.       Plan Discussed with: CRNA  Anesthesia Plan Comments: (LMA/GETA backup discussed.  Patient consented for risks of anesthesia including but not limited to:  - adverse reactions to medications - damage to  eyes, teeth, lips or other oral mucosa - nerve damage due to positioning  - sore throat or hoarseness - damage to heart, brain, nerves, lungs, other parts of body or loss of life  Informed patient about role of CRNA in peri- and intra-operative care.  Patient voiced understanding.)        Anesthesia Quick Evaluation

## 2021-10-09 ENCOUNTER — Other Ambulatory Visit: Payer: Self-pay | Admitting: Psychiatry

## 2021-10-10 ENCOUNTER — Ambulatory Visit: Payer: Self-pay | Admitting: Anesthesiology

## 2021-10-10 ENCOUNTER — Encounter: Payer: Self-pay | Admitting: Anesthesiology

## 2021-10-10 ENCOUNTER — Encounter
Admission: RE | Admit: 2021-10-10 | Discharge: 2021-10-10 | Disposition: A | Discharge status: Home / Self Care | Payer: Medicare HMO | Source: Ambulatory Visit | Attending: Psychiatry | Admitting: Psychiatry

## 2021-10-10 ENCOUNTER — Other Ambulatory Visit: Payer: Self-pay

## 2021-10-10 DIAGNOSIS — F259 Schizoaffective disorder, unspecified: Secondary | ICD-10-CM | POA: Insufficient documentation

## 2021-10-10 DIAGNOSIS — F250 Schizoaffective disorder, bipolar type: Secondary | ICD-10-CM | POA: Diagnosis not present

## 2021-10-10 DIAGNOSIS — F17210 Nicotine dependence, cigarettes, uncomplicated: Secondary | ICD-10-CM | POA: Diagnosis not present

## 2021-10-10 MED ORDER — METHOHEXITAL SODIUM 100 MG/10ML IV SOSY
PREFILLED_SYRINGE | INTRAVENOUS | Status: DC | PRN
  Administered 2021-10-10: 70 mg via INTRAVENOUS

## 2021-10-10 MED ORDER — MIDAZOLAM HCL 2 MG/2ML IJ SOLN
INTRAMUSCULAR | Status: DC | PRN
  Administered 2021-10-10: 2 mg via INTRAVENOUS

## 2021-10-10 MED ORDER — SUCCINYLCHOLINE CHLORIDE 200 MG/10ML IV SOSY
PREFILLED_SYRINGE | INTRAVENOUS | Status: DC | PRN
  Administered 2021-10-10: 100 mg via INTRAVENOUS

## 2021-10-10 MED ORDER — MIDAZOLAM HCL 2 MG/2ML IJ SOLN: 2.0000 mg | Freq: Once | INTRAMUSCULAR | Status: DC

## 2021-10-10 MED ORDER — SODIUM CHLORIDE 0.9 % IV SOLN
500.0000 mL | Freq: Once | INTRAVENOUS | Status: AC
  Administered 2021-10-10: 500 mL via INTRAVENOUS

## 2021-10-10 MED ORDER — GLYCOPYRROLATE 0.2 MG/ML IJ SOLN: 0.3000 mg | Freq: Once | INTRAMUSCULAR | Status: AC

## 2021-10-10 MED ORDER — MIDAZOLAM HCL 2 MG/2ML IJ SOLN
INTRAMUSCULAR | Status: AC
  Filled 2021-10-10: qty 2

## 2021-10-10 MED ORDER — SODIUM CHLORIDE 0.9 % IV SOLN: INTRAVENOUS | Status: DC | PRN

## 2021-10-10 MED ORDER — GLYCOPYRROLATE 0.2 MG/ML IJ SOLN
INTRAMUSCULAR | Status: AC
  Administered 2021-10-10: 0.3 mg via INTRAVENOUS
  Filled 2021-10-10: qty 1

## 2021-10-10 MED ORDER — LABETALOL HCL 5 MG/ML IV SOLN
INTRAVENOUS | Status: AC
  Filled 2021-10-10: qty 4

## 2021-10-10 MED ORDER — LABETALOL HCL 5 MG/ML IV SOLN
INTRAVENOUS | Status: DC | PRN
  Administered 2021-10-10: 10 mg via INTRAVENOUS

## 2021-10-10 NOTE — Procedures (Signed)
ECT SERVICES ?Physician?s Interval Evaluation & Treatment Note ? ?Patient Identification: Jonathan Alexander. ?MRN:  [id number] ?Date of Evaluation:  10/10/2021 ?[place] #: 5 ? ?MADRS:  ? ?MMSE:  ? ?P.E. Findings: ? ?No change to physical exam ? ?Psychiatric Interval Note: ? ?Mood stable no irritability or anger noticed ? ?Subjective: ? ?Patient is a 36 y.o. male seen for evaluation for Electroconvulsive Therapy. ?No complaints ? ?Treatment Summary: ? ? []   Right Unilateral             [x]  Bilateral ?  ?% Energy : 1.0 ms 100% ? ? Impedance: 1420 ohms ? ?Seizure Energy Index: [id number] ?V squared ? ?Postictal Suppression Index: 13% ? ?Seizure Concordance Index: 19% ? ?Medications ? ?Pre Shock: Robinul 0.2 mg ketamine 80 mg Brevital 70 mg succinylcholine 100 mg ? ?Post Shock: Versed 2 mg ? ?Seizure Duration: 35 seconds EMG 35 seconds EEG ? ? ?Comments: ?Follow-up 8 weeks ? ?Lungs: ? ?[x]   Clear to auscultation              ? ?[]  Other:  ? ?Heart: ?  ? [x]   Regular rhythm             []  irregular rhythm ? ? ? [x]   Previous H&P reviewed, patient examined and there are NO CHANGES              ?  ? []   Previous H&P reviewed, patient examined and there are changes noted. ? ? ?Alethia Berthold, MD ?3/8/20233:18 PM ? ?  ?

## 2021-10-10 NOTE — Anesthesia Postprocedure Evaluation (Signed)
Anesthesia Post Note ? ?Patient: Jonathan Alexander. ? ?Procedure(s) Performed: ECT TX ? ?Patient location during evaluation: PACU ?Anesthesia Type: General ?Level of consciousness: awake and alert ?Pain management: pain level controlled ?Vital Signs Assessment: post-procedure vital signs reviewed and stable ?Respiratory status: spontaneous breathing, nonlabored ventilation, respiratory function stable and patient connected to nasal cannula oxygen ?Cardiovascular status: blood pressure returned to baseline and stable ?Postop Assessment: no apparent nausea or vomiting ?Anesthetic complications: no ? ? ?No notable events documented. ? ? ?Last Vitals:  ?Vitals:  ? 10/10/21 1310 10/10/21 1318  ?BP: 108/68 123/90  ?Pulse: 99 (!) 103  ?Resp: 12 19  ?Temp:  36.7 ?C  ?SpO2: 95%   ?  ?Last Pain:  ?Vitals:  ? 10/10/21 1318  ?TempSrc: Temporal  ?PainSc: 0-No pain  ? ? ?  ?  ?  ?  ?  ?  ? ?Precious Haws Sharlet Notaro ? ? ? ? ?

## 2021-10-10 NOTE — H&P (Signed)
Jonathan Shan Figler. is an 36 y.o. male.   ?Chief Complaint: none ?HPI: schizoaffective disorder ? ?Past Medical History:  ?Diagnosis Date  ? Schizophrenia ([place])   ? Tachycardia   ? ? ?History reviewed. No pertinent surgical history. ? ?Family History  ?Family history unknown: Yes  ? ?Social History:  reports that he has been smoking cigarettes. He has a 4.00 pack-year smoking history. He has never used smokeless tobacco. He reports that he does not drink alcohol and does not use drugs. ? ?Allergies:  ?Allergies  ?Allergen Reactions  ? Divalproex Sodium   ? Shellfish-Derived Products   ? ? ?(Not in a hospital admission) ? ? ?No results found for this or any previous visit (from the past 48 hour(s)). ?No results found. ? ?Review of Systems  ?Constitutional: Negative.   ?HENT: Negative.    ?Eyes: Negative.   ?Respiratory: Negative.    ?Cardiovascular: Negative.   ?Gastrointestinal: Negative.   ?Musculoskeletal: Negative.   ?Skin: Negative.   ?Neurological: Negative.   ?Psychiatric/Behavioral: Negative.    ?All other systems reviewed and are negative. ? ?Blood pressure 117/84, pulse (!) 101, temperature (!) 97.5 ?F (36.4 ?C), temperature source Tympanic, resp. rate 18, height 5\' 9"  (1.753 m), weight 98 kg, SpO2 98 %. ?Physical Exam ?Vitals and nursing note reviewed.  ?Constitutional:   ?   Appearance: He is well-developed.  ?HENT:  ?   Head: Normocephalic and atraumatic.  ?Eyes:  ?   Conjunctiva/sclera: Conjunctivae normal.  ?   Pupils: Pupils are equal, round, and reactive to light.  ?Cardiovascular:  ?   Heart sounds: Normal heart sounds.  ?Pulmonary:  ?   Effort: Pulmonary effort is normal.  ?Abdominal:  ?   Palpations: Abdomen is soft.  ?Musculoskeletal:     ?   General: Normal range of motion.  ?   Cervical back: Normal range of motion.  ?Skin: ?   General: Skin is warm and dry.  ?Neurological:  ?   General: No focal deficit present.  ?   Mental Status: He is alert.  ?Psychiatric:     ?   Mood and Affect: Mood  normal.  ?  ? ?Assessment/Plan ?Follow up 6-8 weeks ? ?Alethia Berthold, MD ?10/10/2021, 11:55 AM ? ? ? ?

## 2021-10-10 NOTE — Transfer of Care (Signed)
Immediate Anesthesia Transfer of Care Note ? ?Patient: Jonathan Alexander. ? ?Procedure(s) Performed: ECT TX ? ?Patient Location: PACU ? ?Anesthesia Type:General ? ?Level of Consciousness: drowsy ? ?Airway & Oxygen Therapy: Patient Spontanous Breathing and Patient connected to face mask oxygen ? ?Post-op Assessment: Report given to RN and Post -op Vital signs reviewed and stable ? ?Post vital signs: Reviewed and stable ? ?Last Vitals:  ?Vitals Value Taken Time  ?BP 103/61 10/10/21 1241  ?Temp 36.3 ?C 10/10/21 1238  ?Pulse 100 10/10/21 1241  ?Resp 22 10/10/21 1241  ?SpO2 100 % 10/10/21 1241  ?Vitals shown include unvalidated device data. ? ?Last Pain:  ?Vitals:  ? 10/10/21 1238  ?TempSrc:   ?PainSc: Asleep  ?   ? ?  ? ?Complications: No notable events documented. ?

## 2021-10-10 NOTE — Anesthesia Preprocedure Evaluation (Signed)
Anesthesia Evaluation  Patient identified by MRN, date of birth, ID band Patient awake    Reviewed: Allergy & Precautions, NPO status , Patient's Chart, lab work & pertinent test results, reviewed documented beta blocker date and time   History of Anesthesia Complications Negative for: history of anesthetic complications  Airway Mallampati: II  TM Distance: >3 FB     Dental  (+) Chipped   Pulmonary neg sleep apnea, neg COPD, Current Smoker and Patient abstained from smoking., former smoker,    Pulmonary exam normal        Cardiovascular Exercise Tolerance: Good hypertension, (-) CAD, (-) Past MI and (-) Cardiac Stents Normal cardiovascular exam - Systolic murmurs    Neuro/Psych PSYCHIATRIC DISORDERS Bipolar Disorder Schizophrenia negative neurological ROS     GI/Hepatic negative GI ROS, Neg liver ROS, neg GERD  ,  Endo/Other  diabetes, Oral Hypoglycemic Agents  Renal/GU negative Renal ROS     Musculoskeletal negative musculoskeletal ROS (+)   Abdominal (+) + obese,   Peds  Hematology negative hematology ROS (+)   Anesthesia Other Findings Past Medical History: No date: Schizophrenia (HCC) No date: Tachycardia    Reproductive/Obstetrics                             Anesthesia Physical  Anesthesia Plan  ASA: 3  Anesthesia Plan: General   Post-op Pain Management:    Induction: Intravenous  PONV Risk Score and Plan: 2 and Ondansetron  Airway Management Planned: Mask  Additional Equipment:   Intra-op Plan:   Post-operative Plan:   Informed Consent: I have reviewed the patients History and Physical, chart, labs and discussed the procedure including the risks, benefits and alternatives for the proposed anesthesia with the patient or authorized representative who has indicated his/her understanding and acceptance.     Dental Advisory Given  Plan Discussed with: CRNA and  Anesthesiologist  Anesthesia Plan Comments: (Patient consented for risks of anesthesia including but not limited to:  - adverse reactions to medications - risk of airway placement if required - damage to eyes, teeth, lips or other oral mucosa - nerve damage due to positioning  - sore throat or hoarseness - Damage to heart, brain, nerves, lungs, other parts of body or loss of life  Patient voiced understanding.)        Anesthesia Quick Evaluation  

## 2021-10-23 ENCOUNTER — Emergency Department
Admission: EM | Admit: 2021-10-23 | Discharge: 2021-10-24 | Disposition: A | Discharge status: Home / Self Care | Payer: Medicare HMO | Attending: Emergency Medicine | Admitting: Emergency Medicine

## 2021-10-23 ENCOUNTER — Other Ambulatory Visit: Payer: Self-pay

## 2021-10-23 DIAGNOSIS — R45850 Homicidal ideations: Secondary | ICD-10-CM

## 2021-10-23 DIAGNOSIS — F250 Schizoaffective disorder, bipolar type: Secondary | ICD-10-CM | POA: Diagnosis present

## 2021-10-23 DIAGNOSIS — R45851 Suicidal ideations: Secondary | ICD-10-CM | POA: Diagnosis not present

## 2021-10-23 DIAGNOSIS — F209 Schizophrenia, unspecified: Secondary | ICD-10-CM | POA: Diagnosis present

## 2021-10-23 LAB — COMPREHENSIVE METABOLIC PANEL
ALT: 14 U/L (ref 0–44)
AST: 18 U/L (ref 15–41)
Albumin: 4.3 g/dL (ref 3.5–5.0)
Alkaline Phosphatase: 92 U/L (ref 38–126)
Anion gap: 6 (ref 5–15)
BUN: 11 mg/dL (ref 6–20)
CO2: 28 mmol/L (ref 22–32)
Calcium: 9.8 mg/dL (ref 8.9–10.3)
Chloride: 102 mmol/L (ref 98–111)
Creatinine, Ser: 1.15 mg/dL (ref 0.61–1.24)
GFR, Estimated: >60 mL/min (ref >60)
Glucose, Bld: 95 mg/dL (ref 70–99)
Potassium: 4 mmol/L (ref 3.5–5.1)
Sodium: 136 mmol/L (ref 135–145)
Total Bilirubin: 0.5 mg/dL (ref 0.3–1.2)
Total Protein: 7.2 g/dL (ref 6.5–8.1)

## 2021-10-23 LAB — CBC
HCT: 38.1 % — ABNORMAL LOW (ref 39.0–52.0)
Hemoglobin: 12.4 g/dL — ABNORMAL LOW (ref 13.0–17.0)
MCH: 30 pg (ref 26.0–34.0)
MCHC: 32.5 g/dL (ref 30.0–36.0)
MCV: 92 fL (ref 80.0–100.0)
Platelets: 308 10*3/uL (ref 150–400)
RBC: 4.14 MIL/uL — ABNORMAL LOW (ref 4.22–5.81)
RDW: 12.5 % (ref 11.5–15.5)
WBC: 8.6 10*3/uL (ref 4.0–10.5)
nRBC: 0 % (ref 0.0–0.2)

## 2021-10-23 LAB — ETHANOL: Alcohol, Ethyl (B): <10 mg/dL (ref <10)

## 2021-10-23 LAB — SALICYLATE LEVEL: Salicylate Lvl: <7 mg/dL — ABNORMAL LOW (ref 7.0–30.0)

## 2021-10-23 LAB — ACETAMINOPHEN LEVEL: Acetaminophen (Tylenol), Serum: <10 ug/mL — ABNORMAL LOW (ref 10–30)

## 2021-10-23 NOTE — ED Triage Notes (Signed)
Pt reports coming in for " mental problems"  Feeling depressed, endorses SI/HI thoughts  but denies plans. States hes been feeling this way for a while intermittently. Pt calm and cooperative in triage ?

## 2021-10-23 NOTE — ED Triage Notes (Signed)
This nurse, security, and Jonathan Alexander, tech present in triage. Pt dressed out into hospital scrubs. Pt belongings placed into hospital belongings and labeled appropriately with pt label.  ? ?Belongings include ?1 pair white shoes ?1 pair socks ?1 pair of grey jeans with belt ?1 coat ?1 cell phone with charger ?

## 2021-10-23 NOTE — ED Provider Notes (Signed)
? ?[facility] ?Provider Note ? ? Event Date/Time  ? First MD Initiated Contact with Patient 10/23/21 2236   ?  (approximate) ?History  ?Schizophrenia ? ?HPI ?Jonathan Alexander. is a 36 y.o. male with a stated past medical history of schizophrenia who presents for "mental problems" as well as feeling depressed and endorsing SI/HI without plans.  Patient states that he has been having these symptoms intermittently over the past few months.  Patient endorses increased life stressors.  Patient denies missing any of his medications. ?Physical Exam  ?Triage Vital Signs: ?ED Triage Vitals  ?Enc Vitals Group  ?   BP 10/23/21 2148 (!) 129/92  ?   Pulse Rate 10/23/21 2148 (!) 104  ?   Resp 10/23/21 2148 16  ?   Temp 10/23/21 2148 98.9 ?F (37.2 ?C)  ?   Temp Source 10/23/21 2148 Oral  ?   SpO2 10/23/21 2148 99 %  ?   Weight 10/23/21 2154 230 lb (104.3 kg)  ?   Height 10/23/21 2154 5\' 9"  (1.753 m)  ?   Head Circumference --   ?   Peak Flow --   ?   Pain Score 10/23/21 2154 0  ?   Pain Loc --   ?   Pain Edu? --   ?   Excl. in GC? --   ? ?Most recent vital signs: ?Vitals:  ? 10/23/21 2148  ?BP: (!) 129/92  ?Pulse: (!) 104  ?Resp: 16  ?Temp: 98.9 ?F (37.2 ?C)  ?SpO2: 99%  ? ?General: Awake, oriented x4. ?CV:  Good peripheral perfusion.  ?Resp:  Normal effort.  ?Abd:  No distention.  ?Other:  Middle-aged Hispanic male laying in bed in no distress ?ED Results / Procedures / Treatments  ?Labs ?(all labs ordered are listed, but only abnormal results are displayed) ?Labs Reviewed  ?SALICYLATE LEVEL - Abnormal; Notable for the following components:  ?    Result Value  ? Salicylate Lvl <7.0 (*)   ? All other components within normal limits  ?ACETAMINOPHEN LEVEL - Abnormal; Notable for the following components:  ? Acetaminophen (Tylenol), Serum <10 (*)   ? All other components within normal limits  ?CBC - Abnormal; Notable for the following components:  ? RBC 4.14 (*)   ? Hemoglobin 12.4 (*)   ? HCT 38.1 (*)   ?  All other components within normal limits  ?RESP PANEL BY RT-PCR (FLU A&B, COVID) ARPGX2  ?COMPREHENSIVE METABOLIC PANEL  ?ETHANOL  ?URINE DRUG SCREEN, QUALITATIVE ([facility] ONLY)  ? ?PROCEDURES: ?Critical Care performed: No ?Procedures ?MEDICATIONS ORDERED IN ED: ?Medications - No data to display ?IMPRESSION / MDM / ASSESSMENT AND PLAN / ED COURSE  ?I reviewed the triage vital signs and the nursing notes. ?             ?               ? ?Thoughts are linear and organized, and patient has no AH, VH ?Prior suicide attempt: Denies ?Prior Psychiatric Hospitalizations: Multiple ?Lives in a group home ?Clinically patient displays no overt toxidrome; they are well appearing, with low suspicion for toxic ingestion given history and exam. ?Thoughts unlikely 2/2 anemia, hypothyroidism, infection, or ICH. ? ?Consult: Psychiatry to evaluate patient for potential hold for danger to self. ?Disposition: Care of this patient will be signed out to the oncoming physician at the end of my shift.  All pertinent patient information conveyed and all questions answered.  All further care  and disposition decisions will be made by the oncoming physician. ? ?  ?FINAL CLINICAL IMPRESSION(S) / ED DIAGNOSES  ? ?Final diagnoses:  ?Suicidal ideation  ?Homicidal ideation  ? ?Rx / DC Orders  ? ?ED Discharge Orders   ? ? None  ? ?  ? ?Note:  This document was prepared using Dragon voice recognition software and may include unintentional dictation errors. ?  ?Merwyn Katos, MD ?10/23/21 2323 ? ?
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
## 2021-10-23 NOTE — ED Notes (Signed)
Pt brought in by [employer] voluntarily for mental health eval from [facility], [address], [place]; [place] is pt's legal guardian--notified Cindi Carbon of pt's arrival [phone]) ?

## 2021-10-24 DIAGNOSIS — F250 Schizoaffective disorder, bipolar type: Secondary | ICD-10-CM

## 2021-10-24 MED ORDER — METFORMIN HCL 500 MG PO TABS: 500.0000 mg | ORAL_TABLET | Freq: Two times a day (BID) | ORAL | Status: DC

## 2021-10-24 MED ORDER — ALBUTEROL SULFATE HFA 108 (90 BASE) MCG/ACT IN AERS
2.0000 | INHALATION_SPRAY | Freq: Four times a day (QID) | RESPIRATORY_TRACT | Status: DC | PRN
  Filled 2021-10-24: qty 6.7

## 2021-10-24 NOTE — ED Notes (Addendum)
Discharged with caregiver, Thereasa Distance  back to group home, [facility]. Left with all of belongings. ?

## 2021-10-24 NOTE — ED Provider Notes (Deleted)
Emergency Medicine Observation Re-evaluation Note ? ?Jonathan Alexander. is a 36 y.o. male, seen on rounds today.  Pt initially presented to the ED for complaints of Schizophrenia ? ? ?Physical Exam  ?BP (!) 129/92   Pulse (!) 104   Temp 98.9 ?F (37.2 ?C) (Oral)   Resp 16   Ht 5\' 9"  (1.753 m)   Wt 104.3 kg   SpO2 99%   BMI 33.97 kg/m?  ?Physical Exam ?General: calm ?Psych: calm ? ?ED Course / MDM  ?EKG:  ? ?I have reviewed the labs performed to date as well as medications administered while in observation.  Recent changes in the last 24 hours include none. ? ?Plan  ?Current plan is for psych to assess. ? Jonathan Alexander. is not under involuntary commitment. ? ? ?  ?Horton Marshall, MD ?10/24/21 0144 ? ?

## 2021-10-24 NOTE — ED Notes (Signed)
VOL/Consult completed/Plan to D/C in [place] with OP resources ?
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
## 2021-11-16 ENCOUNTER — Emergency Department
Admission: EM | Admit: 2021-11-16 | Discharge: 2021-11-17 | Disposition: A | Discharge status: Home / Self Care | Payer: 59 | Attending: Emergency Medicine | Admitting: Emergency Medicine

## 2021-11-16 ENCOUNTER — Encounter: Payer: Self-pay | Admitting: Emergency Medicine

## 2021-11-16 DIAGNOSIS — F250 Schizoaffective disorder, bipolar type: Secondary | ICD-10-CM | POA: Insufficient documentation

## 2021-11-16 DIAGNOSIS — Z046 Encounter for general psychiatric examination, requested by authority: Secondary | ICD-10-CM | POA: Diagnosis present

## 2021-11-16 DIAGNOSIS — Z20822 Contact with and (suspected) exposure to covid-19: Secondary | ICD-10-CM | POA: Diagnosis not present

## 2021-11-16 DIAGNOSIS — R45851 Suicidal ideations: Secondary | ICD-10-CM | POA: Diagnosis not present

## 2021-11-16 LAB — RESP PANEL BY RT-PCR (FLU A&B, COVID) ARPGX2
Influenza A by PCR: NEGATIVE
Influenza B by PCR: NEGATIVE
SARS Coronavirus 2 by RT PCR: NEGATIVE

## 2021-11-16 LAB — CBC
HCT: 39.7 % (ref 39.0–52.0)
Hemoglobin: 12.9 g/dL — ABNORMAL LOW (ref 13.0–17.0)
MCH: 30.4 pg (ref 26.0–34.0)
MCHC: 32.5 g/dL (ref 30.0–36.0)
MCV: 93.6 fL (ref 80.0–100.0)
Platelets: 311 10*3/uL (ref 150–400)
RBC: 4.24 MIL/uL (ref 4.22–5.81)
RDW: 12.3 % (ref 11.5–15.5)
WBC: 9.5 10*3/uL (ref 4.0–10.5)
nRBC: 0 % (ref 0.0–0.2)

## 2021-11-16 LAB — COMPREHENSIVE METABOLIC PANEL
ALT: 11 U/L (ref 0–44)
AST: 20 U/L (ref 15–41)
Albumin: 4.3 g/dL (ref 3.5–5.0)
Alkaline Phosphatase: 86 U/L (ref 38–126)
Anion gap: 8 (ref 5–15)
BUN: 8 mg/dL (ref 6–20)
CO2: 24 mmol/L (ref 22–32)
Calcium: 10.2 mg/dL (ref 8.9–10.3)
Chloride: 105 mmol/L (ref 98–111)
Creatinine, Ser: 0.93 mg/dL (ref 0.61–1.24)
GFR, Estimated: >60 mL/min (ref >60)
Glucose, Bld: 100 mg/dL — ABNORMAL HIGH (ref 70–99)
Potassium: 3.9 mmol/L (ref 3.5–5.1)
Sodium: 137 mmol/L (ref 135–145)
Total Bilirubin: 0.4 mg/dL (ref 0.3–1.2)
Total Protein: 7.1 g/dL (ref 6.5–8.1)

## 2021-11-16 LAB — ACETAMINOPHEN LEVEL: Acetaminophen (Tylenol), Serum: <10 ug/mL — ABNORMAL LOW (ref 10–30)

## 2021-11-16 LAB — SALICYLATE LEVEL: Salicylate Lvl: <7 mg/dL — ABNORMAL LOW (ref 7.0–30.0)

## 2021-11-16 NOTE — ED Notes (Signed)
Pt provided with snack and drink at this time ?

## 2021-11-16 NOTE — BH Assessment (Signed)
Comprehensive Clinical Assessment (CCA) Note ? ?11/16/2021 ?Jonathan Alexander. ?[id number] ? ?Chief Complaint: Patient is a 36 year old male presenting to [facility] ED voluntarily. Per triage note Pt presents via police custody under voluntary commitment. He states he was feeling like "he was going to lose it" and has been having bad thoughts. He denies SI/HI, drug or alcohol use. During assessment patient appears alert and oriented x4, calm and cooperative. Patient reports why he is presenting to the ED "spiritual stuff and thinking things that are justified for me, I tried to talk to someone at the group home but it didn't work and I wanted to get away from the drama at the group home." Patient reports that he continues to take his medications at the group home. Patient reports sleeping well and has a adequate appetite. Patient denies current SI/HI/AH/VH ?Chief Complaint  ?Patient presents with  ? Psychiatric Evaluation  ? ?Visit Diagnosis: Schizophrenia  ? ? ?CCA Screening, Triage and Referral (STR) ? ?Patient Reported Information ?How did you hear about us? Self ? ?Referral name: No data recorded ?Referral phone number: No data recorded ? ?Whom do you see for routine medical problems? No data recorded ?Practice/Facility Name: No data recorded ?Practice/Facility Phone Number: No data recorded ?Name of Contact: No data recorded ?Contact Number: No data recorded ?Contact Fax Number: No data recorded ?Prescriber Name: No data recorded ?Prescriber Address (if known): No data recorded ? ?What Is the Reason for Your Visit/Call Today? Patient presents voluntarily due to "spiritual stuff" and "getting away from the drama at the group home." ? ?How Long Has This Been Causing You Problems? > than 6 months ? ?What Do You Feel Would Help You the Most Today? No data recorded ? ?Have You Recently Been in Any Inpatient Treatment (Hospital/Detox/Crisis Center/28-Day Program)? No data recorded ?Name/Location of Program/Hospital:No  data recorded ?How Long Were You There? No data recorded ?When Were You Discharged? No data recorded ? ?Have You Ever Received Services From [facility] Before? No data recorded ?Who Do You See at [facility]? No data recorded ? ?Have You Recently Had Any Thoughts About Hurting Yourself? No ? ?Are You Planning to Commit Suicide/Harm Yourself At This time? No ? ? ?Have you Recently Had Thoughts About Hurting Someone Karolee Ohslse? No ? ?Explanation: No data recorded ? ?Have You Used Any Alcohol or Drugs in the Past 24 Hours? No ? ?How Long Ago Did You Use Drugs or Alcohol? No data recorded ?What Did You Use and How Much? No data recorded ? ?Do You Currently Have a Therapist/Psychiatrist? Yes ? ?Name of Therapist/Psychiatrist: Unknown ? ? ?Have You Been Recently Discharged From Any Office Practice or Programs? No ? ?Explanation of Discharge From Practice/Program: No data recorded ? ?  ?CCA Screening Triage Referral Assessment ?Type of Contact: Face-to-Face ? ?Is this Initial or Reassessment? No data recorded ?Date Telepsych consult ordered in CHL:  No data recorded ?Time Telepsych consult ordered in CHL:  No data recorded ? ?Patient Reported Information Reviewed? No data recorded ?Patient Left Without Being Seen? No data recorded ?Reason for Not Completing Assessment: No data recorded ? ?Collateral Involvement: No data recorded ? ?Does Patient Have a Automotive engineerCourt Appointed Legal Guardian? No data recorded ?Name and Contact of Legal Guardian: No data recorded ?If Minor and Not Living with Parent(s), Who has Custody? No data recorded ?Is CPS involved or ever been involved? Never ? ?Is APS involved or ever been involved? Never ? ? ?Patient Determined To Be At Risk for  Harm To Self or Others Based on Review of Patient Reported Information or Presenting Complaint? No ? ?Method: No data recorded ?Availability of Means: No data recorded ?Intent: No data recorded ?Notification Required: No data recorded ?Additional Information for Danger to  Others Potential: No data recorded ?Additional Comments for Danger to Others Potential: No data recorded ?Are There Guns or Other Weapons in Your Home? No data recorded ?Types of Guns/Weapons: No data recorded ?Are These Weapons Safely Secured?                            No data recorded ?Who Could Verify You Are Able To Have These Secured: No data recorded ?Do You Have any Outstanding Charges, Pending Court Dates, Parole/Probation? No data recorded ?Contacted To Inform of Risk of Harm To Self or Others: No data recorded ? ?Location of Assessment: [facility] ED ? ? ?Does Patient Present under Involuntary Commitment? No ? ?IVC Papers Initial File Date: No data recorded ? ?[place] of Residence: [place] ? ? ?Patient Currently Receiving the Following Services: Group Home; Medication Management ? ? ?Determination of Need: Emergent (2 hours) ? ? ?Options For Referral: No data recorded ? ? ? ?CCA Biopsychosocial ?Intake/Chief Complaint:  No data recorded ?Current Symptoms/Problems: No data recorded ? ?Patient Reported Schizophrenia/Schizoaffective Diagnosis in Past: Yes ? ? ?Strengths: Patient is able to communicate his needs ? ?Preferences: No data recorded ?Abilities: No data recorded ? ?Type of Services Patient Feels are Needed: No data recorded ? ?Initial Clinical Notes/Concerns: No data recorded ? ?Mental Health Symptoms ?Depression:   ?Change in energy/activity; Fatigue; Irritability ?  ?Duration of Depressive symptoms:  ?Greater than two weeks ?  ?Mania:   ?None ?  ?Anxiety:    ?Difficulty concentrating; Worrying; Irritability ?  ?Psychosis:   ?None ?  ?Duration of Psychotic symptoms:  ?Greater than six months ?  ?Trauma:   ?None ?  ?Obsessions:   ?Attempts to suppress/neutralize; Recurrent & persistent thoughts/impulses/images ?  ?Compulsions:   ?None ?  ?Inattention:   ?None ?  ?Hyperactivity/Impulsivity:   ?None ?  ?Oppositional/Defiant Behaviors:   ?None ?  ?Emotional Irregularity:   ?None ?  ?Other Mood/Personality  Symptoms:  No data recorded  ? ?Mental Status Exam ?Appearance and self-care  ?Stature:   ?Average ?  ?Weight:   ?Average weight ?  ?Clothing:   ?Casual ?  ?Grooming:   ?Normal ?  ?Cosmetic use:   ?None ?  ?Posture/gait:   ?Normal ?  ?Motor activity:   ?Not Remarkable ?  ?Sensorium  ?Attention:   ?Normal ?  ?Concentration:   ?Normal ?  ?Orientation:   ?X5 ?  ?Recall/memory:   ?Normal ?  ?Affect and Mood  ?Affect:   ?Flat ?  ?Mood:   ?Other (Comment) ?  ?Relating  ?Eye contact:   ?Normal ?  ?Facial expression:   ?Responsive ?  ?Attitude toward examiner:   ?Cooperative ?  ?Thought and Language  ?Speech flow:  ?Clear and Coherent ?  ?Thought content:   ?Appropriate to Mood and Circumstances ?  ?Preoccupation:   ?None ?  ?Hallucinations:   ?None ?  ?Organization:  No data recorded  ?Executive Functions  ?Fund of Knowledge:   ?Fair ?  ?Intelligence:   ?Average ?  ?Abstraction:   ?Functional ?  ?Judgement:   ?Fair ?  ?Reality Testing:   ?Realistic ?  ?Insight:   ?Fair ?  ?Decision Making:   ?Normal ?  ?Social Functioning  ?Social  Maturity:   ?Responsible ?  ?Social Judgement:   ?Normal ?  ?Stress  ?Stressors:   ?Other (Comment) ?  ?Coping Ability:   ?Normal ?  ?Skill Deficits:   ?None ?  ?Supports:   ?Friends/Service system ?  ? ? ?Religion: ?Religion/Spirituality ?Are You A Religious Person?: No ? ?Leisure/Recreation: ?Leisure / Recreation ?Do You Have Hobbies?: No ? ?Exercise/Diet: ?Exercise/Diet ?Do You Exercise?: No ?Have You Gained or Lost A Significant Amount of Weight in the Past Six Months?: No ?Do You Follow a Special Diet?: No ?Do You Have Any Trouble Sleeping?: No ? ? ?CCA Employment/Education ?Employment/Work Situation: ?Employment / Work Situation ?Employment Situation: On disability ?Why is Patient on Disability: Mental Health ?How Long has Patient Been on Disability: Unknown ?Patient's Job has Been Impacted by Current Illness: No ?Has Patient ever Been in the Military?: No ? ?Education: ?Education ?Is  Patient Currently Attending School?: No ?Did You Attend College?: No ?Did You Have An Individualized Education Program (IIEP): No ?Did You Have Any Difficulty At School?: No ?Patient's Education Has Been Impac

## 2021-11-16 NOTE — ED Triage Notes (Signed)
Pt presents via police custody under voluntary commitment. He states he was feeling like "he was going to lose it" and has been having bad thoughts. He denies SI/HI, drug or alcohol use.  ?

## 2021-11-16 NOTE — ED Provider Notes (Addendum)
We will discussed with his guardian and plan to discharge back to his group home ? ?[facility] ?Provider Note ? ? ? Event Date/Time  ? First MD Initiated Contact with Patient 11/16/21 2036   ?  (approximate) ? ? ?History  ? ?Psychiatric Evaluation ? ? ?HPI ? ?Jonathan Alexander. is a 36 y.o. male who presents to the ED for evaluation of Psychiatric Evaluation ?  ?I review psychiatric NP consult note from 3/21.  Schizoaffective patient has had ECT in the past, as recently as 3/8.  ? ?Patient presents to the ED voluntarily for evaluation of his mental health.  He reports he is here "because it is just to me."  He reports that he has been having "some bad thoughts."  But refuses to elaborate upon this.  Does not answer my question when asked if he has had suicidal thoughts.  ? ?Physical Exam  ? ?Triage Vital Signs: ?ED Triage Vitals  ?Enc Vitals Group  ?   BP 11/16/21 2027 118/79  ?   Pulse Rate 11/16/21 2027 (!) 104  ?   Resp 11/16/21 2027 20  ?   Temp 11/16/21 2027 98 ?F (36.7 ?C)  ?   Temp Source 11/16/21 2027 Oral  ?   SpO2 11/16/21 2027 98 %  ?   Weight 11/16/21 2024 231 lb 7.7 oz (105 kg)  ?   Height 11/16/21 2024 5\' 9"  (1.753 m)  ?   Head Circumference --   ?   Peak Flow --   ?   Pain Score 11/16/21 2024 0  ?   Pain Loc --   ?   Pain Edu? --   ?   Excl. in GC? --   ? ? ?Most recent vital signs: ?Vitals:  ? 11/16/21 2027  ?BP: 118/79  ?Pulse: (!) 104  ?Resp: 20  ?Temp: 98 ?F (36.7 ?C)  ?SpO2: 98%  ? ? ?General: Awake, no distress.  Flat affect. ?CV:  Good peripheral perfusion.  ?Resp:  Normal effort.  ?Abd:  No distention.  ?MSK:  No deformity noted.  ?Neuro:  No focal deficits appreciated. ?Other:   ? ? ?ED Results / Procedures / Treatments  ? ?Labs ?(all labs ordered are listed, but only abnormal results are displayed) ?Labs Reviewed  ?COMPREHENSIVE METABOLIC PANEL - Abnormal; Notable for the following components:  ?    Result Value  ? Glucose, Bld 100 (*)   ? All other components within  normal limits  ?SALICYLATE LEVEL - Abnormal; Notable for the following components:  ? Salicylate Lvl <7.0 (*)   ? All other components within normal limits  ?ACETAMINOPHEN LEVEL - Abnormal; Notable for the following components:  ? Acetaminophen (Tylenol), Serum <10 (*)   ? All other components within normal limits  ?CBC - Abnormal; Notable for the following components:  ? Hemoglobin 12.9 (*)   ? All other components within normal limits  ?RESP PANEL BY RT-PCR (FLU A&B, COVID) ARPGX2  ?ETHANOL  ?URINE DRUG SCREEN, QUALITATIVE ([facility] ONLY)  ? ? ?EKG ? ? ?RADIOLOGY ? ? ?Official radiology report(s): ?No results found. ? ?PROCEDURES and INTERVENTIONS: ? ?Procedures ? ?Medications - No data to display ? ? ?IMPRESSION / MDM / ASSESSMENT AND PLAN / ED COURSE  ?I reviewed the triage vital signs and the nursing notes. ? ?42 old male presents to the ED for evaluation of his mental health.  He is quite reticent and refuses to provide much history for me.  He has  no complaints and has a benign screening labs with normal CBC and metabolic panel.  No signs of particular toxidromes or ingestions.  No medical barriers to psychiatric evaluation and disposition. ? ?Patient seen and evaluated by our psychiatric NP and cleared from their perspective to return back to his group home.  We will discuss with his guardian plan to send back to his group home. ? ? ?FINAL CLINICAL IMPRESSION(S) / ED DIAGNOSES  ? ?Final diagnoses:  ?Suicidal ideation  ? ? ? ?Rx / DC Orders  ? ?ED Discharge Orders   ? ? None  ? ?  ? ? ? ?Note:  This document was prepared using Dragon voice recognition software and may include unintentional dictation errors. ?  ?Delton Prairie, MD ?11/16/21 2152 ? ?  ?Delton Prairie, MD ?11/16/21 2326 ? ?

## 2021-11-16 NOTE — ED Notes (Signed)
Patient transferred from Triage to room 21 after dressing out and screening for contraband. Report received from [place], RN including situation, background, assessment and recommendations. Pt oriented to [employer] including Q15 minute rounds as well as Psychologist, counselling for their protection. Patient is alert and oriented, warm and dry in no acute distress. Patient denies SI, HI, and AVH. Pt. Encouraged to let this nurse know if needs arise.  ?

## 2021-11-16 NOTE — ED Notes (Signed)
TTS in with pt at this time.  
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
## 2021-11-16 NOTE — ED Notes (Signed)
Pt asked again for urine sample, pt states he used restroom at the house and does not need to go. Pt is asked if he can try to urinate a small amount in cup and pt states " I'm not feeling like I can do that." Will collect when pt goes to restroom ?

## 2021-11-16 NOTE — ED Notes (Signed)
This nurse spoke to PT legal guardian, Sterling Big to update. Also spoke to owner of [facility], Clifton James who will have someone come to pick up pt, she states in about 15-25 minutes.  ?

## 2021-11-16 NOTE — ED Notes (Addendum)
Pt dressed out by this RN & EDT Misty Stanley) belongings include: ? ?1 Black shirt ?1 Black shoes ?1 White socks ?1 Black belt ?1 Red/green underwear ?1 pair of jeans  ?1 lanyard necklace ?

## 2021-11-17 DIAGNOSIS — F250 Schizoaffective disorder, bipolar type: Secondary | ICD-10-CM

## 2021-11-17 NOTE — Consult Note (Signed)
[facility] Face-to-Face [facility]iatry Consult  ? ?Reason for Consult:  Psychiatric Evaluation ?Referring Physician:  Dr. Katrinka BlazingSmith ?Patient Identification: Jonathan Horton MarshallSantana Jr. ?MRN:  [id number] ?Principal Diagnosis: Schizoaffective disorder, bipolar type (HCC) ?Diagnosis:  Principal Problem: ?  Schizoaffective disorder, bipolar type (HCC) ? ? ?Total Time spent with patient: 45 minutes ? ?Subjective:   ?"I needed a break"  ? ?HPI: Jonathan Horton MarshallSantana Jr., 36 y.o., male patient seen by this provider; chart reviewed and consulted with Dr. Katrinka BlazingSmith on 11/17/21.  On evaluation Jonathan Horton MarshallSantana Jr. reports that he thought it was a good idea to come to the hospital because he was having a "moment".  He reports that he wanted to hurt someone earlier but knows he wouldn't actually do it, but decided to come to the hospital for a break.  He says he feels that something may have taken over him.  He says current on his medication and denies drug use.  He denies SI/HI/AVH.  He reports "pretty good sleep and appetite."  He reports being able to talk to Dorethia (the owner) if he is having problems.  ? ?Per TTS, Patient denies SI/HI, drug or alcohol use. During assessment patient appears alert and oriented x4, calm and cooperative. Patient reports why he is presenting to the ED "spiritual stuff and thinking things that are justified for me, I tried to talk to someone at the group home but it didn't work and I wanted to get away from the drama at the group home." Patient reports that he continues to take his medications at the group home. Patient reports sleeping well and has a adequate appetite. Patient denies current SI/HI/AH/VH. ? ? ?Past Psychiatric History: schizophrenia ? ?Risk to Self:   ?Risk to Others:   ?Prior Inpatient Therapy:   ?Prior Outpatient Therapy:   ? ?Past Medical History:  ?Past Medical History:  ?Diagnosis Date  ? Schizophrenia (HCC)   ? Tachycardia   ? History reviewed. No pertinent surgical history. ?Family History:  ?Family  History  ?Family history unknown: Yes  ? ?Family Psychiatric  History: unknown ?Social History:  ?Social History  ? ?Substance and Sexual Activity  ?Alcohol Use No  ?   ?Social History  ? ?Substance and Sexual Activity  ?Drug Use No  ?  ?Social History  ? ?Socioeconomic History  ? Marital status: Single  ?  Spouse name: Not on file  ? Number of children: Not on file  ? Years of education: Not on file  ? Highest education level: Not on file  ?Occupational History  ? Not on file  ?Tobacco Use  ? Smoking status: Every Day  ?  Packs/day: 1.00  ?  Years: 4.00  ?  Pack years: 4.00  ?  Types: Cigarettes  ? Smokeless tobacco: Never  ?Substance and Sexual Activity  ? Alcohol use: No  ? Drug use: No  ? Sexual activity: Never  ?Other Topics Concern  ? Not on file  ?Social History Narrative  ? Not on file  ? ?Social Determinants of Health  ? ?Financial Resource Strain: Not on file  ?Food Insecurity: Not on file  ?Transportation Needs: Not on file  ?Physical Activity: Not on file  ?Stress: Not on file  ?Social Connections: Not on file  ? ?Additional Social History: ?  ? ?Allergies:   ?Allergies  ?Allergen Reactions  ? Divalproex Sodium   ? Shellfish-Derived Products   ? ? ?Labs:  ?Results for orders placed or performed during the hospital encounter of 11/16/21 (from the past  48 hour(s))  ?Comprehensive metabolic panel     Status: Abnormal  ? Collection Time: 11/16/21  8:26 PM  ?Result Value Ref Range  ? Sodium 137 135 - 145 mmol/L  ? Potassium 3.9 3.5 - 5.1 mmol/L  ? Chloride 105 98 - 111 mmol/L  ? CO2 24 22 - 32 mmol/L  ? Glucose, Bld 100 (H) 70 - 99 mg/dL  ?  Comment: Glucose reference range applies only to samples taken after fasting for at least 8 hours.  ? BUN 8 6 - 20 mg/dL  ? Creatinine, Ser 0.93 0.61 - 1.24 mg/dL  ? Calcium 10.2 8.9 - 10.3 mg/dL  ? Total Protein 7.1 6.5 - 8.1 g/dL  ? Albumin 4.3 3.5 - 5.0 g/dL  ? AST 20 15 - 41 U/L  ? ALT 11 0 - 44 U/L  ? Alkaline Phosphatase 86 38 - 126 U/L  ? Total Bilirubin 0.4 0.3 -  1.2 mg/dL  ? GFR, Estimated >60 >60 mL/min  ?  Comment: (NOTE) ?Calculated using the CKD-EPI Creatinine Equation (2021) ?  ? Anion gap 8 5 - 15  ?  Comment: Performed at [facility], [address] [place], [place] [address]  ?Salicylate level     Status: Abnormal  ? Collection Time: 11/16/21  8:26 PM  ?Result Value Ref Range  ? Salicylate Lvl <7.0 (L) 7.0 - 30.0 mg/dL  ?  Comment: Performed at [facility], [address] [place], [place] [address]  ?Acetaminophen level     Status: Abnormal  ? Collection Time: 11/16/21  8:26 PM  ?Result Value Ref Range  ? Acetaminophen (Tylenol), Serum <10 (L) 10 - 30 ug/mL  ?  Comment: (NOTE) ?Therapeutic concentrations vary significantly. A range of 10-30 ug/mL  ?may be an effective concentration for many patients. However, some  ?are best treated at concentrations outside of this range. ?Acetaminophen concentrations >150 ug/mL at 4 hours after ingestion  ?and >50 ug/mL at 12 hours after ingestion are often associated with  ?toxic reactions. ? ?Performed at [facility], [address] [place], ?[place] [address] ?  ?cbc     Status: Abnormal  ? Collection Time: 11/16/21  8:26 PM  ?Result Value Ref Range  ? WBC 9.5 4.0 - 10.5 K/uL  ? RBC 4.24 4.22 - 5.81 MIL/uL  ? Hemoglobin 12.9 (L) 13.0 - 17.0 g/dL  ? HCT 39.7 39.0 - 52.0 %  ? MCV 93.6 80.0 - 100.0 fL  ? MCH 30.4 26.0 - 34.0 pg  ? MCHC 32.5 30.0 - 36.0 g/dL  ? RDW 12.3 11.5 - 15.5 %  ? Platelets 311 150 - 400 K/uL  ? nRBC 0.0 0.0 - 0.2 %  ?  Comment: Performed at [facility], [address] [place], [place] [address]  ?Resp Panel by RT-PCR (Flu A&B, Covid) Nasopharyngeal Swab     Status: None  ? Collection Time: 11/16/21  8:26 PM  ? Specimen: Nasopharyngeal Swab; Nasopharyngeal(NP) swabs in vial transport medium  ?Result Value Ref Range  ? SARS Coronavirus 2 by RT PCR NEGATIVE NEGATIVE  ?  Comment: (NOTE) ?SARS-CoV-2 target nucleic acids are NOT DETECTED. ? ?The SARS-CoV-2 RNA is  generally detectable in upper respiratory ?specimens during the acute phase of infection. The lowest ?concentration of SARS-CoV-2 viral copies this assay can detect is ?138 copies/mL. A negative result does not preclude SARS-Cov-2 ?infection and should not be used as the sole basis for treatment or ?other patient management decisions. A negative  result may occur with  ?improper specimen collection/handling, submission of specimen other ?than nasopharyngeal swab, presence of viral mutation(s) within the ?areas targeted by this assay, and inadequate number of viral ?copies(<138 copies/mL). A negative result must be combined with ?clinical observations, patient history, and epidemiological ?information. The expected result is Negative. ? ?Fact Sheet for Patients:  ?BloggerCourse.com ? ?Fact Sheet for Healthcare Providers:  ?SeriousBroker.it ? ?This test is no t yet approved or cleared by the [place] FDA and  ?has been authorized for detection and/or diagnosis of SARS-CoV-2 by ?FDA under an Emergency Use Authorization (EUA). This EUA will remain  ?in effect (meaning this test can be used) for the duration of the ?COVID-19 declaration under Section 564(b)(1) of the Act, 21 ?U.S.C.section 360bbb-3(b)(1), unless the authorization is terminated  ?or revoked sooner.  ? ? ?  ? Influenza A by PCR NEGATIVE NEGATIVE  ? Influenza B by PCR NEGATIVE NEGATIVE  ?  Comment: (NOTE) ?The Xpert Xpress SARS-CoV-2/FLU/RSV plus assay is intended as an aid ?in the diagnosis of influenza from Nasopharyngeal swab specimens and ?should not be used as a sole basis for treatment. Nasal washings and ?aspirates are unacceptable for Xpert Xpress SARS-CoV-2/FLU/RSV ?testing. ? ?Fact Sheet for Patients: ?BloggerCourse.com ? ?Fact Sheet for Healthcare Providers: ?SeriousBroker.it ? ?This test is not yet approved or cleared by the [place] FDA  and ?has been authorized for detection and/or diagnosis of SARS-CoV-2 by ?FDA under an Emergency Use Authorization (EUA). This EUA will remain ?in effect (meaning this test can be used) for the duration of

## 2021-11-21 ENCOUNTER — Emergency Department (EMERGENCY_DEPARTMENT_HOSPITAL)
Admission: EM | Admit: 2021-11-21 | Discharge: 2021-11-22 | Disposition: A | Discharge status: Other Acute Inpatient Hospital | Payer: 59 | Source: Home / Self Care | Attending: Emergency Medicine | Admitting: Emergency Medicine

## 2021-11-21 ENCOUNTER — Other Ambulatory Visit: Payer: Self-pay

## 2021-11-21 DIAGNOSIS — Y900 Blood alcohol level of less than 20 mg/100 ml: Secondary | ICD-10-CM | POA: Insufficient documentation

## 2021-11-21 DIAGNOSIS — R45851 Suicidal ideations: Secondary | ICD-10-CM | POA: Insufficient documentation

## 2021-11-21 DIAGNOSIS — F439 Reaction to severe stress, unspecified: Secondary | ICD-10-CM | POA: Insufficient documentation

## 2021-11-21 DIAGNOSIS — R45850 Homicidal ideations: Secondary | ICD-10-CM | POA: Insufficient documentation

## 2021-11-21 DIAGNOSIS — F250 Schizoaffective disorder, bipolar type: Secondary | ICD-10-CM | POA: Diagnosis present

## 2021-11-21 DIAGNOSIS — F259 Schizoaffective disorder, unspecified: Secondary | ICD-10-CM | POA: Diagnosis not present

## 2021-11-21 DIAGNOSIS — F209 Schizophrenia, unspecified: Secondary | ICD-10-CM | POA: Insufficient documentation

## 2021-11-21 DIAGNOSIS — Z20822 Contact with and (suspected) exposure to covid-19: Secondary | ICD-10-CM | POA: Insufficient documentation

## 2021-11-21 DIAGNOSIS — I10 Essential (primary) hypertension: Secondary | ICD-10-CM | POA: Insufficient documentation

## 2021-11-21 LAB — COMPREHENSIVE METABOLIC PANEL
ALT: 10 U/L (ref 0–44)
AST: 14 U/L — ABNORMAL LOW (ref 15–41)
Albumin: 4.4 g/dL (ref 3.5–5.0)
Alkaline Phosphatase: 88 U/L (ref 38–126)
Anion gap: 9 (ref 5–15)
BUN: 12 mg/dL (ref 6–20)
CO2: 23 mmol/L (ref 22–32)
Calcium: 10 mg/dL (ref 8.9–10.3)
Chloride: 106 mmol/L (ref 98–111)
Creatinine, Ser: 1.03 mg/dL (ref 0.61–1.24)
GFR, Estimated: >60 mL/min (ref >60)
Glucose, Bld: 110 mg/dL — ABNORMAL HIGH (ref 70–99)
Potassium: 4.2 mmol/L (ref 3.5–5.1)
Sodium: 138 mmol/L (ref 135–145)
Total Bilirubin: 0.5 mg/dL (ref 0.3–1.2)
Total Protein: 7.3 g/dL (ref 6.5–8.1)

## 2021-11-21 LAB — CBC
HCT: 39.7 % (ref 39.0–52.0)
Hemoglobin: 12.9 g/dL — ABNORMAL LOW (ref 13.0–17.0)
MCH: 30.1 pg (ref 26.0–34.0)
MCHC: 32.5 g/dL (ref 30.0–36.0)
MCV: 92.5 fL (ref 80.0–100.0)
Platelets: 346 10*3/uL (ref 150–400)
RBC: 4.29 MIL/uL (ref 4.22–5.81)
RDW: 12.7 % (ref 11.5–15.5)
WBC: 10.5 10*3/uL (ref 4.0–10.5)
nRBC: 0 % (ref 0.0–0.2)

## 2021-11-21 LAB — ETHANOL: Alcohol, Ethyl (B): <10 mg/dL (ref <10)

## 2021-11-21 LAB — ACETAMINOPHEN LEVEL: Acetaminophen (Tylenol), Serum: <10 ug/mL — ABNORMAL LOW (ref 10–30)

## 2021-11-21 LAB — SALICYLATE LEVEL: Salicylate Lvl: <7 mg/dL — ABNORMAL LOW (ref 7.0–30.0)

## 2021-11-21 NOTE — ED Notes (Signed)
VOL/Pending psych consult 

## 2021-11-21 NOTE — ED Notes (Signed)
Patient Items: ? ?Red and Black plaid pants ?Red plaid boxers ?Vans T-Shirts ?Wallace [employer] Flip Flops ?Black Cell Phone ?Necklace ? ?

## 2021-11-21 NOTE — ED Triage Notes (Signed)
Patient presents to ER from [facility] with suicidal ideation. States he has been feeling like this for a while. Denies plan.  ?

## 2021-11-21 NOTE — ED Notes (Signed)
Snack tray, drink and warm blankets provided.  Pt sitting on edge of bed at this time ?

## 2021-11-21 NOTE — BH Assessment (Signed)
Comprehensive Clinical Assessment (CCA) Note ? ?11/21/2021 ?Jonathan Alexander. ?[id number] ?Recommendations for Services/Supports/Treatments: Consulted with Madaline Brilliant., NP, who determined pt. does not meet inpatient psychiatric criteria. Notified Dr. Erma Heritage and Selena Batten, RN of disposition recommendation.  ? ?Jonathan Alexander. is a 36 year old., Black, Hispanic, English speaking male with a history of Schizoaffective disorder, bipolar type. Pt presented to the ED voluntarily due to endorsing SI at [facility]. Per chart review, pt alluded to having thoughts of harming babies. Pt expressed feeling anxious about the well-being of his mother and that had caused him to be overwhelmed with anxiety. Pt explained that he would never hurt babies, and is never around babies at all. Pt had coherent speech; thoughts were irrelevant and somewhat bizarre. Pt was preoccupied with having the psych team contact his mother and let her know that he is concerned about her. Pt had limited reality testing and was visibly anxious. Pt did not appear to be responding to internal stimuli. Pt was distracted by obsessive thoughts throughout the assessment. Pt denied current SI/HI/AV/H. ? ?Chief Complaint:  ?Chief Complaint  ?Patient presents with  ? Suicidal  ? ?Visit Diagnosis: Schizophrenia  ? ? ?CCA Screening, Triage and Referral (STR) ? ?Patient Reported Information ?How did you hear about [place]? Other (Comment) Mudlogger) ? ?Referral name: No data recorded ?Referral phone number: No data recorded ? ?Whom do you see for routine medical problems? No data recorded ?Practice/Facility Name: No data recorded ?Practice/Facility Phone Number: No data recorded ?Name of Contact: No data recorded ?Contact Number: No data recorded ?Contact Fax Number: No data recorded ?Prescriber Name: No data recorded ?Prescriber Address (if known): No data recorded ? ?What Is the Reason for Your Visit/Call Today? Patient presents to ER from [facility] with suicidal ideation.  States he has been feeling like this for a while. ? ?How Long Has This Been Causing You Problems? <Week ? ?What Do You Feel Would Help You the Most Today? Stress Management ? ? ?Have You Recently Been in Any Inpatient Treatment (Hospital/Detox/Crisis Center/28-Day Program)? No data recorded ?Name/Location of Program/Hospital:No data recorded ?How Long Were You There? No data recorded ?When Were You Discharged? No data recorded ? ?Have You Ever Received Services From [employer] [facility] Before? No data recorded ?Who Do You See at [facility]? No data recorded ? ?Have You Recently Had Any Thoughts About Hurting Yourself? Yes ? ?Are You Planning to Commit Suicide/Harm Yourself At This time? No ? ? ?Have you Recently Had Thoughts About Hurting Someone Karolee Ohs? No ? ?Explanation: No data recorded ? ?Have You Used Any Alcohol or Drugs in the Past 24 Hours? No ? ?How Long Ago Did You Use Drugs or Alcohol? No data recorded ?What Did You Use and How Much? No data recorded ? ?Do You Currently Have a Therapist/Psychiatrist? Yes ? ?Name of Therapist/Psychiatrist: via group home ? ? ?Have You Been Recently Discharged From Any Office Practice or Programs? No ? ?Explanation of Discharge From Practice/Program: No data recorded ? ?  ?CCA Screening Triage Referral Assessment ?Type of Contact: Face-to-Face ? ?Is this Initial or Reassessment? No data recorded ?Date Telepsych consult ordered in [facility]:  No data recorded ?Time Telepsych consult ordered in [facility]:  No data recorded ? ?Patient Reported Information Reviewed? No data recorded ?Patient Left Without Being Seen? No data recorded ?Reason for Not Completing Assessment: No data recorded ? ?Collateral Involvement: McKnight,Vince (Legal Guardian)   [phone] ? ? ?Does Patient Have a Automotive engineer Guardian? No data recorded ?Name and Contact  of Legal Guardian: No data recorded ?If Minor and Not Living with Parent(s), Who has Custody? n/a ? ?Is CPS involved or ever been involved?  Never ? ?Is APS involved or ever been involved? Never ? ? ?Patient Determined To Be At Risk for Harm To Self or Others Based on Review of Patient Reported Information or Presenting Complaint? No ? ?Method: No data recorded ?Availability of Means: No data recorded ?Intent: No data recorded ?Notification Required: No data recorded ?Additional Information for Danger to Others Potential: No data recorded ?Additional Comments for Danger to Others Potential: No data recorded ?Are There Guns or Other Weapons in Your Home? No data recorded ?Types of Guns/Weapons: No data recorded ?Are These Weapons Safely Secured?                            No data recorded ?Who Could Verify You Are Able To Have These Secured: No data recorded ?Do You Have any Outstanding Charges, Pending Court Dates, Parole/Probation? No data recorded ?Contacted To Inform of Risk of Harm To Self or Others: No data recorded ? ?Location of Assessment: [facility] ED ? ? ?Does Patient Present under Involuntary Commitment? Yes ? ?IVC Papers Initial File Date: 11/21/21 ? ? ?[place] of Residence: [place] ? ? ?Patient Currently Receiving the Following Services: Group Home; Medication Management ? ? ?Determination of Need: Emergent (2 hours) ? ? ?Options For Referral: Other: Comment (Pt is psych cleared and can be discharged.) ? ? ? ? ?CCA Biopsychosocial ?Intake/Chief Complaint:  No data recorded ?Current Symptoms/Problems: No data recorded ? ?Patient Reported Schizophrenia/Schizoaffective Diagnosis in Past: Yes ? ? ?Strengths: Pt has stable housing, a supportive family, and is able to ask for help. ? ?Preferences: No data recorded ?Abilities: No data recorded ? ?Type of Services Patient Feels are Needed: No data recorded ? ?Initial Clinical Notes/Concerns: No data recorded ? ?Mental Health Symptoms ?Depression:   ?Hopelessness; Difficulty Concentrating ?  ?Duration of Depressive symptoms:  ?Less than two weeks ?  ?Mania:   ?None ?  ?Anxiety:    ?Difficulty  concentrating; Worrying; Tension ?  ?Psychosis:   ?None ?  ?Duration of Psychotic symptoms:  ?N/A ?  ?Trauma:   ?N/A ?  ?Obsessions:   ?Attempts to suppress/neutralize; Recurrent & persistent thoughts/impulses/images ?  ?Compulsions:   ?None ?  ?Inattention:   ?None ?  ?Hyperactivity/Impulsivity:   ?None ?  ?Oppositional/Defiant Behaviors:   ?None ?  ?Emotional Irregularity:   ?None ?  ?Other Mood/Personality Symptoms:  No data recorded  ? ?Mental Status Exam ?Appearance and self-care  ?Stature:   ?Average ?  ?Weight:   ?Average weight ?  ?Clothing:   ?-- (In scrubs) ?  ?Grooming:   ?Normal ?  ?Cosmetic use:   ?None ?  ?Posture/gait:   ?Normal ?  ?Motor activity:   ?Not Remarkable ?  ?Sensorium  ?Attention:   ?Persistent ?  ?Concentration:   ?Anxiety interferes ?  ?Orientation:   ?X5 ?  ?Recall/memory:   ?Normal ?  ?Affect and Mood  ?Affect:   ?Anxious ?  ?Mood:   ?Anxious ?  ?Relating  ?Eye contact:   ?Fleeting ?  ?Facial expression:   ?Anxious ?  ?Attitude toward examiner:   ?Cooperative ?  ?Thought and Language  ?Speech flow:  ?Clear and Coherent ?  ?Thought content:   ?Appropriate to Mood and Circumstances ?  ?Preoccupation:   ?Obsessions ?  ?Hallucinations:   ?None ?  ?Organization:  No data  recorded  ?Executive Functions  ?Fund of Knowledge:   ?Fair ?  ?Intelligence:   ?Average ?  ?Abstraction:   ?Functional ?  ?Judgement:   ?Fair ?  ?Reality Testing:   ?Variable ?  ?Insight:   ?Lacking ?  ?Decision Making:   ?Only simple ?  ?Social Functioning  ?Social Maturity:   ?Responsible ?  ?Social Judgement:   ?Normal ?  ?Stress  ?Stressors:   ?Grief/losses ?  ?Coping Ability:   ?Overwhelmed ?  ?Skill Deficits:   ?Decision making ?  ?Supports:   ?Friends/Service system; Family ?  ? ? ?Religion: ?Religion/Spirituality ?Are You A Religious Person?: Yes ?What is Your Religious Affiliation?: Ephriam KnucklesChristian ? ?Leisure/Recreation: ?Leisure / Recreation ?Do You Have Hobbies?: No ? ?Exercise/Diet: ?Exercise/Diet ?Do You Exercise?:  No ?Have You Gained or Lost A Significant Amount of Weight in the Past Six Months?: No ?Do You Follow a Special Diet?: No ?Do You Have Any Trouble Sleeping?: No ? ? ?CCA Employment/Education ?Employment/Work Situation: ?[employer]

## 2021-11-21 NOTE — ED Notes (Signed)
Pt to ED via [facility] voluntarily for mental health evaluation.   ?

## 2021-11-21 NOTE — ED Notes (Signed)
Patient denies SI, but suggests thoughts of HI.  States has had thoughts of harming babies.  States "God is going to be mad at me.  I have backslid recently.  I have made 4 mistakes, you are only allowed to make 2 mistakes".   ?

## 2021-11-21 NOTE — ED Provider Notes (Signed)
? ?[facility] ?Provider Note ? ? ? Event Date/Time  ? First MD Initiated Contact with Patient 11/21/21 1827   ?  (approximate) ? ? ?History  ? ?Suicidal ? ? ?HPI ? ?Jonathan Alexander. is a 36 y.o. male with history of schizoaffective disorder here with suicidal and homicidal ideation.  Initially reported that he had thoughts about wanting to harm himself.  He then told nurse that he wanted to "kill babies" and that he has been having a hard time.  To me, he is somewhat hyperreligious, states that he does not want to tell me what has been thinking because people "should not say that."  Remainder of history limited due to psychiatric illness. ?  ? ? ?Physical Exam  ? ?Triage Vital Signs: ?ED Triage Vitals  ?Enc Vitals Group  ?   BP 11/21/21 1753 (!) 144/82  ?   Pulse Rate 11/21/21 1753 (!) 110  ?   Resp 11/21/21 1753 18  ?   Temp 11/21/21 1753 98.1 ?F (36.7 ?C)  ?   Temp Source 11/21/21 1753 Oral  ?   SpO2 11/21/21 1753 94 %  ?   Weight 11/21/21 1754 231 lb 7.7 oz (105 kg)  ?   Height 11/21/21 1754 5\' 9"  (1.753 m)  ?   Head Circumference --   ?   Peak Flow --   ?   Pain Score 11/21/21 1754 0  ?   Pain Loc --   ?   Pain Edu? --   ?   Excl. in GC? --   ? ? ?Most recent vital signs: ?Vitals:  ? 11/21/21 1753  ?BP: (!) 144/82  ?Pulse: (!) 110  ?Resp: 18  ?Temp: 98.1 ?F (36.7 ?C)  ?SpO2: 94%  ? ? ? ?General: Awake, no distress.  ?CV:  Good peripheral perfusion.  ?Resp:  Normal effort.  ?Abd:  No distention.  ?Other:  Odd affect, denies SI, HI. ? ? ?ED Results / Procedures / Treatments  ? ?Labs ?(all labs ordered are listed, but only abnormal results are displayed) ?Labs Reviewed  ?COMPREHENSIVE METABOLIC PANEL - Abnormal; Notable for the following components:  ?    Result Value  ? Glucose, Bld 110 (*)   ? AST 14 (*)   ? All other components within normal limits  ?SALICYLATE LEVEL - Abnormal; Notable for the following components:  ? Salicylate Lvl <7.0 (*)   ? All other components within normal  limits  ?ACETAMINOPHEN LEVEL - Abnormal; Notable for the following components:  ? Acetaminophen (Tylenol), Serum <10 (*)   ? All other components within normal limits  ?CBC - Abnormal; Notable for the following components:  ? Hemoglobin 12.9 (*)   ? All other components within normal limits  ?ETHANOL  ?URINE DRUG SCREEN, QUALITATIVE ([facility] ONLY)  ? ? ? ?EKG ? ? ? ?RADIOLOGY ? ? ? ?I also independently reviewed and agree wit radiologist interpretations. ? ? ?PROCEDURES: ? ?Critical Care performed: No ? ? ?MEDICATIONS ORDERED IN ED: ?Medications - No data to display ? ? ?IMPRESSION / MDM / ASSESSMENT AND PLAN / ED COURSE  ?I reviewed the triage vital signs and the nursing notes. ?             ?               ?MDM:  ?36 year old male here with initially reported suicidal ideation.  Patient has a history of psychiatric disease with fairly frequent ED visits.  On my  assessment, the patient states that he was just "stressed" and wanted someone to speak to.  He is adamant that he has no SI or HI on further questioning.  When questioned about what he had said about hurting other people, he states that it is just something that he has thought about, but he would never do it.  He cites religion as a protective factor.  While the history is unclear, he does seem calm and cooperative now.  CBC shows no leukocytosis or anemia.  Tylenol and salicylate negative.  CMP is negative.  Psychiatry has evaluated, does not feel he needs inpatient mission.  His caregiver and group home were called who also were in agreement.  Will discharge home. ? ? ?MEDICATIONS GIVEN IN ED: ?Medications - No data to display ? ? ?Consults:  ?Psychiatry/TTS ? ? ?EMR reviewed  ?Previous ED visits ? ? ? ? ?FINAL CLINICAL IMPRESSION(S) / ED DIAGNOSES  ? ?Final diagnoses:  ?Stress  ? ? ? ?Rx / DC Orders  ? ?ED Discharge Orders   ? ? None  ? ?  ? ? ? ?Note:  This document was prepared using Dragon voice recognition software and may include unintentional  dictation errors. ?  ?Shaune Pollack, MD ?11/21/21 2218 ? ?

## 2021-11-21 NOTE — ED Notes (Signed)
Patient guardian notified that patient is here in ED and is to be discharge back to group home.  Writer called Group home and notified that is pt is being D/C and needs someone to pick up.  Group home worker states no one there pick up pt and will be able to pick up in the AM.  ?

## 2021-11-22 ENCOUNTER — Inpatient Hospital Stay
Admission: AD | Admit: 2021-11-22 | Discharge: 2021-11-29 | DRG: 885 | Disposition: A | Discharge status: Home / Self Care | Payer: 59 | Source: Intra-hospital | Attending: Psychiatry | Admitting: Psychiatry

## 2021-11-22 ENCOUNTER — Encounter: Payer: Self-pay | Admitting: Psychiatry

## 2021-11-22 ENCOUNTER — Other Ambulatory Visit: Payer: Self-pay

## 2021-11-22 DIAGNOSIS — J45909 Unspecified asthma, uncomplicated: Secondary | ICD-10-CM | POA: Diagnosis present

## 2021-11-22 DIAGNOSIS — Z20822 Contact with and (suspected) exposure to covid-19: Secondary | ICD-10-CM | POA: Diagnosis present

## 2021-11-22 DIAGNOSIS — F250 Schizoaffective disorder, bipolar type: Secondary | ICD-10-CM

## 2021-11-22 DIAGNOSIS — D509 Iron deficiency anemia, unspecified: Secondary | ICD-10-CM | POA: Diagnosis present

## 2021-11-22 DIAGNOSIS — F17210 Nicotine dependence, cigarettes, uncomplicated: Secondary | ICD-10-CM | POA: Diagnosis present

## 2021-11-22 DIAGNOSIS — Z7984 Long term (current) use of oral hypoglycemic drugs: Secondary | ICD-10-CM

## 2021-11-22 DIAGNOSIS — F259 Schizoaffective disorder, unspecified: Secondary | ICD-10-CM | POA: Diagnosis present

## 2021-11-22 DIAGNOSIS — I10 Essential (primary) hypertension: Secondary | ICD-10-CM | POA: Diagnosis present

## 2021-11-22 DIAGNOSIS — H1010 Acute atopic conjunctivitis, unspecified eye: Secondary | ICD-10-CM | POA: Diagnosis present

## 2021-11-22 LAB — RESP PANEL BY RT-PCR (FLU A&B, COVID) ARPGX2
Influenza A by PCR: NEGATIVE
Influenza B by PCR: NEGATIVE
SARS Coronavirus 2 by RT PCR: NEGATIVE

## 2021-11-22 LAB — URINE DRUG SCREEN, QUALITATIVE (ARMC ONLY)
Amphetamines, Ur Screen: NOT DETECTED
Barbiturates, Ur Screen: NOT DETECTED
Benzodiazepine, Ur Scrn: NOT DETECTED
Cannabinoid 50 Ng, Ur ~~LOC~~: NOT DETECTED
Cocaine Metabolite,Ur ~~LOC~~: NOT DETECTED
MDMA (Ecstasy)Ur Screen: NOT DETECTED
Methadone Scn, Ur: NOT DETECTED
Opiate, Ur Screen: NOT DETECTED
Phencyclidine (PCP) Ur S: NOT DETECTED
Tricyclic, Ur Screen: POSITIVE — AB

## 2021-11-22 MED ORDER — ACETAMINOPHEN 325 MG PO TABS: 650.0000 mg | ORAL_TABLET | Freq: Four times a day (QID) | ORAL | Status: DC | PRN

## 2021-11-22 MED ORDER — ALUM & MAG HYDROXIDE-SIMETH 200-200-20 MG/5ML PO SUSP
30.0000 mL | ORAL | Status: DC | PRN
Start: 2021-11-22 — End: 2021-11-29

## 2021-11-22 MED ORDER — MAGNESIUM HYDROXIDE 400 MG/5ML PO SUSP
30.0000 mL | Freq: Every day | ORAL | Status: DC | PRN
Start: 2021-11-22 — End: 2021-11-29

## 2021-11-22 MED ORDER — METOPROLOL SUCCINATE ER 25 MG PO TB24
100.0000 mg | ORAL_TABLET | Freq: Every day | ORAL | Status: DC
  Administered 2021-11-23 – 2021-11-29 (×6): 100 mg via ORAL
  Filled 2021-11-22 (×7): qty 4

## 2021-11-22 MED ORDER — METOPROLOL SUCCINATE ER 50 MG PO TB24
100.0000 mg | ORAL_TABLET | Freq: Every day | ORAL | Status: DC
  Administered 2021-11-22: 100 mg via ORAL
  Filled 2021-11-22: qty 2

## 2021-11-22 NOTE — ED Notes (Signed)
Per Dr. Toni Amend patient to be admitted to BMU 1511 ?

## 2021-11-22 NOTE — ED Notes (Signed)
Report received from Katie, RN including SBAR. Patient alert and oriented, warm and dry, and in no acute distress. Patient denies SI, HI, AVH and pain. Patient made aware of Q15 minute rounds and Rover and Officer presence for their safety. Patient instructed to come to this nurse with needs or concerns.  ?

## 2021-11-22 NOTE — ED Notes (Signed)
Updated on plan of care. Jonathan Alexander, group home owner called for update. States that she his confused about plan of care. Updated Jonathan Alexander to most recent information. Jonathan Alexander states that she has not been contacted by pt legal guardian, [name] at this time. She is agreeable with plan of care. Contact numbers updated to reflect current ones. She wants to be notified once decision is made on where to keep patient and for how long. She states that she will also call ACT team to update that he does not need transportation for discharge at this time. ? ?Home [phone] ?Cell [phone] ?

## 2021-11-22 NOTE — Plan of Care (Signed)
Patient new to the unit tonight, hasn't had time to progress  Problem: Education: Goal: Knowledge of [facility] Education information/materials will improve Outcome: Not Progressing Goal: Emotional status will improve Outcome: Not Progressing Goal: Mental status will improve Outcome: Not Progressing Goal: Verbalization of understanding the information provided will improve Outcome: Not Progressing   Problem: Safety: Goal: Periods of time without injury will increase Outcome: Not Progressing   Problem: Education: Goal: Will be free of psychotic symptoms Outcome: Not Progressing Goal: Knowledge of the prescribed therapeutic regimen will improve Outcome: Not Progressing   Problem: Safety: Goal: Ability to redirect hostility and anger into socially appropriate behaviors will improve Outcome: Not Progressing Goal: Ability to remain free from injury will improve Outcome: Not Progressing   

## 2021-11-22 NOTE — ED Notes (Signed)
Hospital meal provided, pt tolerated w/o complaints.  Waste discarded appropriately.  

## 2021-11-22 NOTE — ED Notes (Signed)
Report to McGovern, RN in [place]. Pt to go to room 319 in BMU. Belongings bag 1 of 1 obtained to go with pt  ?

## 2021-11-22 NOTE — ED Notes (Signed)
Hospital meal provided.  100% consumed, pt tolerated w/o complaints.  Waste discarded appropriately.   

## 2021-11-22 NOTE — ED Provider Notes (Signed)
Emergency Medicine Observation Re-evaluation Note ? ?Jonathan Alexander. is a 36 y.o. male, seen on rounds today.  Pt initially presented to the ED for complaints of Suicidal ?Currently, the patient is resting comfortably without complaints. ? ?Physical Exam  ?BP (!) 144/82   Pulse (!) 110   Temp 98.1 ?F (36.7 ?C) (Oral)   Resp 18   Ht 5\' 9"  (1.753 m)   Wt 105 kg   SpO2 94%   BMI 34.18 kg/m?  ?Physical Exam ?Gen: No acute distress  ?Resp: Normal rise and fall of chest ?Neuro: Moving all four extremities ?Psych: Resting currently, calm and cooperative when awake ? ? ? ?ED Course / MDM  ?EKG:  ? ?I have reviewed the labs performed to date as well as medications administered while in observation.  Recent changes in the last 24 hours include no acute events overnight. ? ?Plan  ?Current plan is for likely discharge back to group home in the morning. ? Jonathan Alexander. is not under involuntary commitment. ? ? ?  ?Oiva Dibari, Horton Marshall, DO ?11/22/21 0431 ? ?

## 2021-11-22 NOTE — ED Notes (Signed)
Pt provided with night time snack and drink at this time  

## 2021-11-22 NOTE — Tx Team (Signed)
Initial Treatment Plan ?11/22/2021 ?8:32 PM ?Jonathan Alexander. ?[id number]:[id number] ? ? ? ?PATIENT STRESSORS: ?Educational concerns   ?Medication change or noncompliance   ? ? ?PATIENT STRENGTHS: ?Motivation for treatment/growth  ?Religious Affiliation  ? ? ?PATIENT IDENTIFIED PROBLEMS: ?Acute Psychosis  ?Anxiety  ?  ?  ?  ?  ?  ?  ?  ?  ? ?DISCHARGE CRITERIA:  ?Improved stabilization in mood, thinking, and/or behavior ?Verbal commitment to aftercare and medication compliance ? ?PRELIMINARY DISCHARGE PLAN: ?Outpatient therapy ?Return to previous living arrangement ? ?PATIENT/FAMILY INVOLVEMENT: ?This treatment plan has been presented to and reviewed with the patient, Jonathan Alexander.. The patient has been given the opportunity to ask questions and make suggestions. ? ?Elmyra Ricks, RN ?11/22/2021, 8:32 PM ?

## 2021-11-22 NOTE — Progress Notes (Signed)
Patient admitted from [facility] - report received from [place], [place]. Patient presents preoccupied, hyper religions, disorganized thought. Pt minimal with staff. Pt refused skin check with this Clinical research associate. Pt refused to sign paperwork. Pt oriented to the unit and his room. Pt being monitored Q 15 minutes for safety per unit protocol. Pt remains safe on the unit.  ?

## 2021-11-22 NOTE — BH Assessment (Signed)
Writer spoke with patient's legal guardian Oswaldo Done [phone]), and received verbal consent for admission on the [facility] BMU. The call was witnessed by Sabino Snipes. ?

## 2021-11-22 NOTE — Consult Note (Signed)
[facility] Face-to-Face Psychiatry Consult  ? ?Reason for Consult: Suicidal  ?Referring Physician: Dr. Erma Heritage ?Patient Identification: Jonathan Alexander. ?MRN:  [id number] ?Principal Diagnosis: <principal problem not specified> ?Diagnosis:  Active Problems: ?  Schizoaffective disorder, bipolar type (HCC) ?  Hypertension ?  Schizophrenia (HCC) ? ? ?Total Time spent with patient: 1 hour ? ?Subjective:  "Can you call my mom?" ?Jonathan Alexander. is a 36 y.o. male patient presented to [facility] ED via POV voluntary. Due to endorsing SI at [facility]. Per chart review, the patient indicated having thoughts of harming babies. The patient expressed feeling anxious about his mother's well-being, which caused him to be overwhelmed with anxiety. The patient explained that he would never hurt babies and is never around babies at all. The patient had coherent speech; thoughts were irrelevant and somewhat bizarre. The patient was preoccupied with having the psych team contact his mother and let her know he was concerned about her. The patient had limited reality testing and was visibly anxious. The patient did not appear to be responding to internal stimuli. The patient was distracted by obsessive thoughts throughout the assessment.  ? ?The patient was seen face-to-face by this provider; chart reviewed and consulted with Dr. Erma Heritage on 11/21/2021 due to the care of the patient. It was discussed with the EDP that the patient does not meet the criteria to be admitted to the inpatient unit. On evaluation the patient is alert and oriented x4, calm and cooperative, and mood-congruent with affect.  ?The patient does not appear to be responding to internal or external stimuli. Neither is the patient presenting with any delusional thinking. The patient denies auditory or visual hallucinations. The patient denies any suicidal, homicidal, or self-harm ideations. The patient is not presenting with any psychotic or paranoid behaviors. During an encounter  with the patient, he/she was able to answer questions appropriately.Make sure information about collaborative is in the notes  ? ?HPI:  Dr. Erma Heritage, Jonathan Alexander. is a 36 y.o. male with history of schizoaffective disorder here with suicidal and homicidal ideation.  Initially reported that he had thoughts about wanting to harm himself.  He then told nurse that he wanted to "kill babies" and that he has been having a hard time.  To me, he is somewhat hyperreligious, states that he does not want to tell me what has been thinking because people "should not say that."  Remainder of history limited due to psychiatric illness. ? ?Past Psychiatric History:  ?Schizophrenia (HCC) ? ?Risk to Self:   ?Risk to Others:   ?Prior Inpatient Therapy:   ?Prior Outpatient Therapy:   ? ?Past Medical History:  ?Past Medical History:  ?Diagnosis Date  ? Schizophrenia (HCC)   ? Tachycardia   ? History reviewed. No pertinent surgical history. ?Family History:  ?Family History  ?Family history unknown: Yes  ? ?Family Psychiatric  History:  ?Social History:  ?Social History  ? ?Substance and Sexual Activity  ?Alcohol Use No  ?   ?Social History  ? ?Substance and Sexual Activity  ?Drug Use No  ?  ?Social History  ? ?Socioeconomic History  ? Marital status: Single  ?  Spouse name: Not on file  ? Number of children: Not on file  ? Years of education: Not on file  ? Highest education level: Not on file  ?Occupational History  ? Not on file  ?Tobacco Use  ? Smoking status: Every Day  ?  Packs/day: 1.00  ?  Years: 4.00  ?  Pack years: 4.00  ?  Types: Cigarettes  ? Smokeless tobacco: Never  ?Substance and Sexual Activity  ? Alcohol use: No  ? Drug use: No  ? Sexual activity: Never  ?Other Topics Concern  ? Not on file  ?Social History Narrative  ? Not on file  ? ?Social Determinants of Health  ? ?Financial Resource Strain: Not on file  ?Food Insecurity: Not on file  ?Transportation Needs: Not on file  ?Physical Activity: Not on file  ?Stress:  Not on file  ?Social Connections: Not on file  ? ?Additional Social History: ?  ? ?Allergies:   ?Allergies  ?Allergen Reactions  ? Divalproex Sodium   ? Shellfish-Derived Products   ? ? ?Labs:  ?Results for orders placed or performed during the hospital encounter of 11/21/21 (from the past 48 hour(s))  ?Comprehensive metabolic panel     Status: Abnormal  ? Collection Time: 11/21/21  5:55 PM  ?Result Value Ref Range  ? Sodium 138 135 - 145 mmol/L  ? Potassium 4.2 3.5 - 5.1 mmol/L  ? Chloride 106 98 - 111 mmol/L  ? CO2 23 22 - 32 mmol/L  ? Glucose, Bld 110 (H) 70 - 99 mg/dL  ?  Comment: Glucose reference range applies only to samples taken after fasting for at least 8 hours.  ? BUN 12 6 - 20 mg/dL  ? Creatinine, Ser 1.03 0.61 - 1.24 mg/dL  ? Calcium 10.0 8.9 - 10.3 mg/dL  ? Total Protein 7.3 6.5 - 8.1 g/dL  ? Albumin 4.4 3.5 - 5.0 g/dL  ? AST 14 (L) 15 - 41 U/L  ? ALT 10 0 - 44 U/L  ? Alkaline Phosphatase 88 38 - 126 U/L  ? Total Bilirubin 0.5 0.3 - 1.2 mg/dL  ? GFR, Estimated >60 >60 mL/min  ?  Comment: (NOTE) ?Calculated using the CKD-EPI Creatinine Equation (2021) ?  ? Anion gap 9 5 - 15  ?  Comment: Performed at [facility], [address] [place], [place] [address]  ?Ethanol     Status: None  ? Collection Time: 11/21/21  5:55 PM  ?Result Value Ref Range  ? Alcohol, Ethyl (B) <10 <10 mg/dL  ?  Comment: (NOTE) ?Lowest detectable limit for serum alcohol is 10 mg/dL. ? ?For medical purposes only. ?Performed at [facility], [address] [place], ?[place] [address] ?  ?Salicylate level     Status: Abnormal  ? Collection Time: 11/21/21  5:55 PM  ?Result Value Ref Range  ? Salicylate Lvl <7.0 (L) 7.0 - 30.0 mg/dL  ?  Comment: Performed at [facility], [address] [place], [place] [address]  ?Acetaminophen level     Status: Abnormal  ? Collection Time: 11/21/21  5:55 PM  ?Result Value Ref Range  ? Acetaminophen (Tylenol), Serum <10 (L) 10 - 30 ug/mL  ?  Comment: (NOTE) ?Therapeutic  concentrations vary significantly. A range of 10-30 ug/mL  ?may be an effective concentration for many patients. However, some  ?are best treated at concentrations outside of this range. ?Acetaminophen concentrations >150 ug/mL at 4 hours after ingestion  ?and >50 ug/mL at 12 hours after ingestion are often associated with  ?toxic reactions. ? ?Performed at [facility], [address] [place], ?[place] [address] ?  ?cbc     Status: Abnormal  ? Collection Time: 11/21/21  5:55 PM  ?Result Value Ref Range  ? WBC 10.5 4.0 - 10.5 K/uL  ? RBC 4.29 4.22 - 5.81  MIL/uL  ? Hemoglobin 12.9 (L) 13.0 - 17.0 g/dL  ? HCT 39.7 39.0 - 52.0 %  ? MCV 92.5 80.0 - 100.0 fL  ? MCH 30.1 26.0 - 34.0 pg  ? MCHC 32.5 30.0 - 36.0 g/dL  ? RDW 12.7 11.5 - 15.5 %  ? Platelets 346 150 - 400 K/uL  ? nRBC 0.0 0.0 - 0.2 %  ?  Comment: Performed at [facility], [address], [place], [place] [address]  ? ? ?No current facility-administered medications for this encounter.  ? ?Current Outpatient Medications  ?Medication Sig Dispense Refill  ? albuterol (VENTOLIN HFA) 108 (90 Base) MCG/ACT inhaler Inhale 2 puffs into the lungs every 6 (six) hours as needed for wheezing or shortness of breath. 8 g 0  ? clonazePAM (KLONOPIN) 1 MG tablet Take 1 tablet (1 mg total) by mouth 3 (three) times daily. 90 tablet 1  ? cloZAPine (CLOZARIL) 100 MG tablet TAKE 3 AND 1/2 TABLETS (350MG ) BY MOUTH AT BEDTIME 25 tablet 10  ? desmopressin (DDAVP) 0.2 MG tablet Take 1 tablet (0.2 mg total) by mouth at bedtime. 30 tablet 1  ? EPINEPHrine 0.3 mg/0.3 mL IJ SOAJ injection Inject 0.3 mg into the muscle as directed.    ? ferrous sulfate 325 (65 FE) MG tablet Take 1 tablet (325 mg total) by mouth daily with breakfast. 30 tablet 1  ? imipramine (TOFRANIL) 50 MG tablet Take 2 tablets (100 mg total) by mouth at bedtime. 30 tablet 1  ? lithium carbonate (ESKALITH) 450 MG CR tablet Take 2 tablets (900 mg total) by mouth at bedtime. 60 tablet 1  ? loratadine  (CLARITIN) 10 MG tablet Take 1 tablet (10 mg total) by mouth daily. 30 tablet 1  ? metFORMIN (GLUCOPHAGE) 500 MG tablet Take 1 tablet (500 mg total) by mouth 2 (two) times daily with a meal. 60 tablet 1

## 2021-11-22 NOTE — ED Notes (Addendum)
ACT team will be here to pickup patient for discharge between 1030AM-1130AM. She will take him back to the group home. EPD notified. Per previous note, legal guardian and group home both have been notified of pending discharge. ? ?RN also called  ?McKnight,Vince Legal Guardian [phone]    ?to confirm appropriate discharge plans. He is agreeable to plan.  ?

## 2021-11-22 NOTE — ED Notes (Addendum)
Report given to [facility], RN in [facility] In[facility] unit, requested covid test ?

## 2021-11-22 NOTE — ED Notes (Signed)
VOLUNTARY awaiting admit to [facility] BMU. ?

## 2021-11-22 NOTE — ED Notes (Addendum)
Phone call from Waldon Merl, NP who states that upon further evaluation, that pt needs to stay in hospital. RN contacted legal guardian,  ?[place] [phone]    ? ?Who is agreeable with plan. He will call both group home and ACT team to let them know that he will now be staying in the hospital at this time. ?

## 2021-11-23 DIAGNOSIS — F250 Schizoaffective disorder, bipolar type: Secondary | ICD-10-CM

## 2021-11-23 LAB — HEMOGLOBIN A1C
Hgb A1c MFr Bld: 4.8 % (ref 4.8–5.6)
Mean Plasma Glucose: 91.06 mg/dL

## 2021-11-23 LAB — LIPID PANEL
Cholesterol: 95 mg/dL (ref 0–200)
HDL: 29 mg/dL — ABNORMAL LOW (ref >40)
LDL Cholesterol: 45 mg/dL (ref 0–99)
Total CHOL/HDL Ratio: 3.3 RATIO
Triglycerides: 107 mg/dL (ref <150)
VLDL: 21 mg/dL (ref 0–40)

## 2021-11-23 MED ORDER — CLONAZEPAM 0.5 MG PO TABS
0.5000 mg | ORAL_TABLET | Freq: Three times a day (TID) | ORAL | Status: DC
  Administered 2021-11-23 – 2021-11-29 (×17): 0.5 mg via ORAL
  Filled 2021-11-23 (×17): qty 1

## 2021-11-23 MED ORDER — DESMOPRESSIN ACETATE 0.1 MG PO TABS
0.2000 mg | ORAL_TABLET | Freq: Every day | ORAL | Status: DC
Start: 2021-11-23 — End: 2021-11-29
  Administered 2021-11-23 – 2021-11-28 (×6): 0.2 mg via ORAL
  Filled 2021-11-23 (×6): qty 2

## 2021-11-23 MED ORDER — OLANZAPINE 10 MG PO TABS
20.0000 mg | ORAL_TABLET | Freq: Every day | ORAL | Status: DC
  Administered 2021-11-23 – 2021-11-28 (×6): 20 mg via ORAL
  Filled 2021-11-23 (×6): qty 2

## 2021-11-23 MED ORDER — ATORVASTATIN CALCIUM 20 MG PO TABS
10.0000 mg | ORAL_TABLET | Freq: Every day | ORAL | Status: DC
  Administered 2021-11-23 – 2021-11-28 (×6): 10 mg via ORAL
  Filled 2021-11-23 (×6): qty 1

## 2021-11-23 MED ORDER — METFORMIN HCL 500 MG PO TABS
500.0000 mg | ORAL_TABLET | Freq: Two times a day (BID) | ORAL | Status: DC
  Administered 2021-11-23 – 2021-11-29 (×10): 500 mg via ORAL
  Filled 2021-11-23 (×10): qty 1

## 2021-11-23 MED ORDER — LORATADINE 10 MG PO TABS
10.0000 mg | ORAL_TABLET | Freq: Every day | ORAL | Status: DC
  Administered 2021-11-23 – 2021-11-29 (×7): 10 mg via ORAL
  Filled 2021-11-23 (×7): qty 1

## 2021-11-23 MED ORDER — ALBUTEROL SULFATE HFA 108 (90 BASE) MCG/ACT IN AERS
2.0000 | INHALATION_SPRAY | Freq: Four times a day (QID) | RESPIRATORY_TRACT | Status: DC | PRN
Start: 2021-11-23 — End: 2021-11-29

## 2021-11-23 MED ORDER — IMIPRAMINE HCL 50 MG PO TABS
100.0000 mg | ORAL_TABLET | Freq: Every day | ORAL | Status: DC
  Administered 2021-11-23 – 2021-11-28 (×6): 100 mg via ORAL
  Filled 2021-11-23 (×6): qty 2

## 2021-11-23 MED ORDER — VENLAFAXINE HCL ER 75 MG PO CP24
75.0000 mg | ORAL_CAPSULE | Freq: Every day | ORAL | Status: DC
  Administered 2021-11-24 – 2021-11-29 (×6): 75 mg via ORAL
  Filled 2021-11-23 (×6): qty 1

## 2021-11-23 MED ORDER — METOPROLOL SUCCINATE ER 25 MG PO TB24
100.0000 mg | ORAL_TABLET | Freq: Every day | ORAL | Status: DC
Start: 2021-11-23 — End: 2021-11-23

## 2021-11-23 MED ORDER — CLOZAPINE 100 MG PO TABS
400.0000 mg | ORAL_TABLET | Freq: Every day | ORAL | Status: DC
  Administered 2021-11-23 – 2021-11-28 (×6): 400 mg via ORAL
  Filled 2021-11-23 (×6): qty 4

## 2021-11-23 MED ORDER — LITHIUM CARBONATE ER 450 MG PO TBCR
450.0000 mg | EXTENDED_RELEASE_TABLET | Freq: Two times a day (BID) | ORAL | Status: DC
  Administered 2021-11-23 – 2021-11-29 (×13): 450 mg via ORAL
  Filled 2021-11-23 (×13): qty 1

## 2021-11-23 NOTE — Progress Notes (Signed)
Recreation Therapy Notes ? ?INPATIENT RECREATION THERAPY ASSESSMENT ? ?Patient Details ?Name: Jonathan Alexander. ?MRN: [id number] ?DOB: April 02, 1986 ?Today's Date: 11/23/2021 ?      ?Information Obtained From: ?Patient (Patitent unable to participate in assessment at this time.) ? ?Able to Participate in Assessment/Interview: ?  ? ?Patient Presentation: ?  ? ?Reason for Admission (Per Patient): ?  ? ?Patient Stressors: ?  ? ?Coping Skills:   ?  ? ?Leisure Interests (2+):  ?  ? ?Frequency of Recreation/Participation: ?  ? ?Awareness of Community Resources:  ?  ? ?Community Resources:  ?  ? ?Current Use: ?  ? ?If no, Barriers?: ?  ? ?Expressed Interest in [employer] Information: ?  ? ?[place] of Residence:  ?  ? ?Patient Main Form of Transportation: ?  ? ?Patient Strengths:  ?  ? ?Patient Identified Areas of Improvement:  ?  ? ?Patient Goal for Hospitalization:  ?  ? ?Current SI (including self-harm):  ?  ? ?Current HI:  ?  ? ?Current AVH: ?  ? ?Staff Intervention Plan: ?  ? ?Consent to Intern Participation: ?  ? ?Graham Doukas ?11/23/2021, 3:06 PM ?

## 2021-11-23 NOTE — Progress Notes (Signed)
Patient is being sexually inappropriate with this Clinical research associate. Patient was in the medication room receiving noon meds and stated to this writer "give me a kiss". This Clinical research associate explained to patient that this was inappropriate. Patient then states "I'm mad at you because you won't give me a kiss". This writer expressed again that this is inappropriate. Patient then states "kiss my finger", this writer states "you kiss your finger", and he did. Patient then proceeds to lean in, over the medication counter and states "let me lick your crack". This Clinical research associate stated again that this would be inappropriate and that he has medication that he needs to take. Patient turns around to look out of the door and then looks at this writer and stated "I'm mad at you, come on, let me kiss your butt". Patient then bends down and is looking at this writer and states "I'm mad at you because you won't fuck me". This Clinical research associate gave patient his medication and water. Patient took the medication and asked for more water. This Clinical research associate gave patient another cup of water and before patient took the cup, he licked this writer's gloved hand. This writer pulled my hand back and stated that this is it and he can go back to his room. Patient then asked this writer could he jerk off to me. This Clinical research associate told patient that this was inappropriate and if he were to do that, he needed to be in his room, with his door closed. This Clinical research associate notifed MD of this. ?

## 2021-11-23 NOTE — Group Note (Signed)
[facility] LCSW Group Therapy Note ? ? ?Group Date: 11/23/2021 ?Start Time: 1300 ?End Time: 1400 ? ?Type of Therapy and Topic:  Group Therapy:  Feelings around Relapse and Recovery ? ?Participation Level:  Did Not Attend  ? ?Mood: ? ?Description of Group:   ? Patients in this group will discuss emotions they experience before and after a relapse. They will process how experiencing these feelings, or avoidance of experiencing them, relates to having a relapse. Facilitator will guide patients to explore emotions they have related to recovery. Patients will be encouraged to process which emotions are more powerful. They will be guided to discuss the emotional reaction significant others in their lives may have to patients? relapse or recovery. Patients will be assisted in exploring ways to respond to the emotions of others without this contributing to a relapse. ? ?Therapeutic Goals: ?Patient will identify two or more emotions that lead to relapse for them:  ?Patient will identify two emotions that result when they relapse:  ?Patient will identify two emotions related to recovery:  ?Patient will demonstrate ability to communicate their needs through discussion and/or role plays. ? ? ?Summary of Patient Progress: ? ?Patient did not attend group despite encouraged participation.  ? ? ?Therapeutic Modalities:   ?Cognitive Behavioral Therapy ?Solution-Focused Therapy ?Assertiveness Training ?Relapse Prevention Therapy ? ? ?Tamarah Bhullar W Patrich Heinze, LCSWA ?

## 2021-11-23 NOTE — [facility] Suicide Risk Assessment (Signed)
[facility] Admission Suicide Risk Assessment ? ? ?Nursing information obtained from:  Patient ?Demographic factors:  NA ?Current Mental Status:  NA ?Loss Factors:  NA ?Historical Factors:  NA ?Risk Reduction Factors:  NA ? ?Total Time spent with patient: 1 hour ?Principal Problem: Schizoaffective disorder (HCC) ?Diagnosis:  Principal Problem: ?  Schizoaffective disorder (HCC) ?Active Problems: ?  Hypertension ? ?Subjective Data: Patient seen and chart reviewed.  Patient brought into the hospital from his group home because of worsening agitation and aggression and disorganized behavior not sleeping.  Intermittent inconsistent comments about hurting other people.  On interview patient denies any suicidal or homicidal ideation currently and so far has not been aggressive. ? ?Continued Clinical Symptoms:  ?Alcohol Use Disorder Identification Test Final Score (AUDIT): 0 ?The "Alcohol Use Disorders Identification Test", Guidelines for Use in Primary Care, Second Edition.  World Science writer [facility]). ?Score between 0-7:  no or low risk or alcohol related problems. ?Score between 8-15:  moderate risk of alcohol related problems. ?Score between 16-19:  high risk of alcohol related problems. ?Score 20 or above:  warrants further diagnostic evaluation for alcohol dependence and treatment. ? ? ?CLINICAL FACTORS:  ? Schizophrenia:   Less than 26 years old ? ? ?Musculoskeletal: ?Strength & Muscle Tone: within normal limits ?Gait & Station: normal ?Patient leans: N/A ? ?Psychiatric Specialty Exam: ? ?Presentation  ?General Appearance: Appropriate for Environment ? ?Eye Contact:Good ? ?Speech:Clear and Coherent ? ?Speech Volume:Normal ? ?Handedness:Right ? ? ?Mood and Affect  ?Mood:Euthymic ? ?Affect:Appropriate; Congruent ? ? ?Thought Process  ?Thought Processes:Coherent ? ?Descriptions of Associations:Intact ? ?Orientation:Full (Time, Place and Person) ? ?Thought Content:Logical; WDL ? ?History of Schizophrenia/Schizoaffective  disorder:Yes ? ?Duration of Psychotic Symptoms:N/A ? ?Hallucinations:No data recorded ?Ideas of Reference:None ? ?Suicidal Thoughts:No data recorded ?Homicidal Thoughts:No data recorded ? ?Sensorium  ?Memory:Immediate Good; Remote Good; Recent Good ? ?Judgment:Fair ? ?Insight:Fair ? ? ?Executive Functions  ?Concentration:Fair ? ?Attention Span:Fair ? ?Recall:Fair ? ?Fund of Knowledge:Fair ? ?Language:Fair ? ? ?Psychomotor Activity  ?Psychomotor Activity:No data recorded ? ?Assets  ?Assets:Communication Skills; Desire for Improvement; Financial Resources/Insurance; Leisure Time; Social Support; Talents/Skills; Vocational/Educational ? ? ?Sleep  ?Sleep:No data recorded ? ? ?Physical Exam: ?Physical Exam ?Vitals and nursing note reviewed.  ?Constitutional:   ?   Appearance: Normal appearance.  ?HENT:  ?   Head: Normocephalic and atraumatic.  ?   Mouth/Throat:  ?   Pharynx: Oropharynx is clear.  ?Eyes:  ?   Pupils: Pupils are equal, round, and reactive to light.  ?Cardiovascular:  ?   Rate and Rhythm: Normal rate and regular rhythm.  ?Pulmonary:  ?   Effort: Pulmonary effort is normal.  ?   Breath sounds: Normal breath sounds.  ?Abdominal:  ?   General: Abdomen is flat.  ?   Palpations: Abdomen is soft.  ?Musculoskeletal:     ?   General: Normal range of motion.  ?Skin: ?   General: Skin is warm and dry.  ?Neurological:  ?   General: No focal deficit present.  ?   Mental Status: He is alert. Mental status is at baseline.  ?Psychiatric:     ?   Attention and Perception: He is inattentive.     ?   Mood and Affect: Mood normal. Affect is labile and inappropriate.     ?   Speech: He is noncommunicative. Speech is tangential.     ?   Behavior: Behavior is withdrawn.     ?   Thought Content: Thought content  is paranoid. Thought content does not include homicidal or suicidal ideation.     ?   Cognition and Memory: Cognition is impaired. Memory is impaired.     ?   Judgment: Judgment is inappropriate.  ? ?Review of Systems   ?Constitutional: Negative.   ?HENT: Negative.    ?Eyes: Negative.   ?Respiratory: Negative.    ?Cardiovascular: Negative.   ?Gastrointestinal: Negative.   ?Musculoskeletal: Negative.   ?Skin: Negative.   ?Neurological: Negative.   ?Psychiatric/Behavioral:  Negative for depression, hallucinations, memory loss, substance abuse and suicidal ideas. The patient is nervous/anxious and has insomnia.   ?Blood pressure (!) 127/97, pulse (!) 108, temperature 99.2 ?F (37.3 ?C), temperature source Oral, resp. rate 16, height 5\' 9"  (1.753 m), weight 92.5 kg, SpO2 98 %. Body mass index is 30.13 kg/m?. ? ? ?COGNITIVE FEATURES THAT CONTRIBUTE TO RISK:  ?Thought constriction (tunnel vision)   ? ?SUICIDE RISK:  ? Minimal: No identifiable suicidal ideation.  Patients presenting with no risk factors but with morbid ruminations; may be classified as minimal risk based on the severity of the depressive symptoms ? ?PLAN OF CARE: Continue 15-minute checks.  Restart medication.  Engage in individual and group counseling.  Ongoing assessment of dangerousness prior to discharge ? ?I certify that inpatient services furnished can reasonably be expected to improve the patient's condition.  ? ? , MD ?11/23/2021, 12:21 PM ? ?

## 2021-11-23 NOTE — Plan of Care (Signed)
D- Patient alert and oriented. Patient presented in a sexually inappropriate/preoccupied mood on assessment first stating "I'm going through some things, I feel like I'm special". Patient reported that he didn't know how he slept last night, "I forgot". Patient stated to this writer and Valli Glance, RN, orienting with me, "y'all have some pretty voices, I like that". Patient denied any signs/symptoms of depression/anxiety, stating "I don't think so, I'm feeling good". Patient also denied SI, HI, AVH, and pain at this time "I don't think I'm thinking about hurting myself or somebody else". Patient stated in treatment team "I need some help from you, I don't want ECT, I'm scared". Patient also stated "I'm fine, I don't need anything".  ? ?A- Scheduled medications administered to patient, per MD orders. Support and encouragement provided.  Routine safety checks conducted every 15 minutes.  Patient informed to notify staff with problems or concerns. ? ?R- No adverse drug reactions noted. Patient contracts for safety at this time. Patient compliant with medications and treatment plan. Patient receptive, calm, and cooperative. Patient has also been inappropriate with other male staff and patients on he unit. Patient remains safe at this time. ? ?Problem: Education: ?Goal: Knowledge of [place] [facility] Education information/materials will improve ?Outcome: Not Progressing ?Goal: Emotional status will improve ?Outcome: Not Progressing ?Goal: Mental status will improve ?Outcome: Not Progressing ?Goal: Verbalization of understanding the information provided will improve ?Outcome: Not Progressing ?  ?Problem: Safety: ?Goal: Periods of time without injury will increase ?Outcome: Not Progressing ?  ?Problem: Education: ?Goal: Will be free of psychotic symptoms ?Outcome: Not Progressing ?Goal: Knowledge of the prescribed therapeutic regimen will improve ?Outcome: Not Progressing ?  ?Problem: Safety: ?Goal: Ability to redirect  hostility and anger into socially appropriate behaviors will improve ?Outcome: Not Progressing ?Goal: Ability to remain free from injury will improve ?Outcome: Not Progressing ?  ?

## 2021-11-23 NOTE — Progress Notes (Signed)
Recreation Therapy Notes ? ? ?Date: 11/23/2021  ?  ?Time: 10:45am  ?  ?Location: Courtyard  ?  ?Behavioral response: Appropriate ?  ?Intervention Topic:  Social Skills  ?  ?Discussion/Intervention:  ?Group content on today was focused on social skills. The group defined social skills and identified ways they use social skills. Patients expressed what obstacles they face when trying to be social. Participants described the importance of social skills. The group listed ways to improve social skills and reasons to improve social skills. Individuals had an opportunity to learn new and improve social skills as well as identify their weaknesses. ?  ?Clinical Observations/Feedback: ?Patient came to group and expressed past experiences with socializing. Individual was social with peers and staff while participating in the intervention.   ?Clee Pandit LRT/CTRS  ?  ? ? ? ? ? ? ? ?Yoceline Bazar ?11/23/2021 12:28 PM ?
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
## 2021-11-23 NOTE — Progress Notes (Signed)
Recreation Therapy Notes ? ?INPATIENT RECREATION TR PLAN ? ?Patient Details ?Name: Jonathan Alexander. ?MRN: [id number] ?DOB: 1985/09/15 ?Today's Date: 11/23/2021 ? ?Rec Therapy Plan ?Is patient appropriate for Therapeutic Recreation?: Yes ?Treatment times per week: at least 3 ?Estimated Length of Stay: 5-7 days ?TR Treatment/Interventions: Group participation (Comment) ? ?Discharge Criteria ?Pt will be discharged from therapy if:: Discharged ?Treatment plan/goals/alternatives discussed and agreed upon by:: Patient/family ? ?Discharge Summary ?  ? ? ?Tyshea Imel ?11/23/2021, 3:07 PM ?

## 2021-11-23 NOTE — [facility] Suicide Risk Assessment (Signed)
[facility] INPATIENT:  Family/Significant Other Suicide Prevention Education ? ?Suicide Prevention Education:  ?Education Completed; Market researcher,  (Legal Guardian w/ [facility] [place]) has been identified by the patient as the family member/significant other with whom the patient will be residing, and identified as the person(s) who will aid the patient in the event of a mental health crisis (suicidal ideations/suicide attempt).  With written consent from the patient, the family member/significant other has been provided the following suicide prevention education, prior to the and/or following the discharge of the patient. ? ?The suicide prevention education provided includes the following: ?Suicide risk factors ?Suicide prevention and interventions ?National Suicide Hotline telephone number ?[facility] assessment telephone number ?[facility] Emergency Assistance 911 ?[place] and/or Residential Mobile Crisis Unit telephone number ? ?Request made of family/significant other to: ?Remove weapons (e.g., guns, rifles, knives), all items previously/currently identified as safety concern.   ?Remove drugs/medications (over-the-counter, prescriptions, illicit drugs), all items previously/currently identified as a safety concern. ? ?The family member/significant other verbalizes understanding of the suicide prevention education information provided.  The family member/significant other agrees to remove the items of safety concern listed above. ? ?Jonathan Alexander ?11/23/2021, 3:19 PM ?

## 2021-11-23 NOTE — BH IP Treatment Plan (Signed)
Interdisciplinary Treatment and Diagnostic Plan Update ? ?11/23/2021 ?Time of Session: 0930 ?Jonathan Alexander. ?MRN: [id number] ? ?Principal Diagnosis: Schizoaffective disorder ([place]) ? ?Secondary Diagnoses: Principal Problem: ?  Schizoaffective disorder ([place]) ? ? ?Current Medications:  ?Current Facility-Administered Medications  ?Medication Dose Route Frequency Provider Last Rate Last Admin  ? acetaminophen (TYLENOL) tablet 650 mg  650 mg Oral Q6H PRN Sherlon Handing, NP      ? alum & mag hydroxide-simeth (MAALOX/MYLANTA) 200-200-20 MG/5ML suspension 30 mL  30 mL Oral Q4H PRN Sherlon Handing, NP      ? magnesium hydroxide (MILK OF MAGNESIA) suspension 30 mL  30 mL Oral Daily PRN Waldon Merl F, NP      ? metoprolol succinate (TOPROL-XL) 24 hr tablet 100 mg  100 mg Oral Daily Waldon Merl F, NP   100 mg at 11/23/21 8676  ? ?PTA Medications: ?Medications Prior to Admission  ?Medication Sig Dispense Refill Last Dose  ? albuterol (VENTOLIN HFA) 108 (90 Base) MCG/ACT inhaler Inhale 2 puffs into the lungs every 6 (six) hours as needed for wheezing or shortness of breath. 8 g 0   ? atorvastatin (LIPITOR) 10 MG tablet Take 10 mg by mouth at bedtime.     ? chlorhexidine (PERIDEX) 0.12 % solution Use as directed 15 mLs in the mouth or throat 2 (two) times daily.     ? clonazePAM (KLONOPIN) 1 MG tablet Take 1 tablet (1 mg total) by mouth 3 (three) times daily. (Patient not taking: Reported on 11/22/2021) 90 tablet 1   ? cloZAPine (CLOZARIL) 100 MG tablet TAKE 3 AND 1/2 TABLETS (350MG) BY MOUTH AT BEDTIME (Patient not taking: Reported on 11/22/2021) 25 tablet 10   ? clozapine (CLOZARIL) 200 MG tablet Take 400 mg by mouth at bedtime.     ? desmopressin (DDAVP) 0.2 MG tablet Take 1 tablet (0.2 mg total) by mouth at bedtime. 30 tablet 1   ? EPINEPHrine 0.3 mg/0.3 mL IJ SOAJ injection Inject 0.3 mg into the muscle as needed for anaphylaxis.     ? ferrous sulfate 325 (65 FE) MG tablet Take 1 tablet (325 mg total) by  mouth daily with breakfast. (Patient not taking: Reported on 11/22/2021) 30 tablet 1   ? imipramine (TOFRANIL) 50 MG tablet Take 2 tablets (100 mg total) by mouth at bedtime. 30 tablet 1   ? lithium carbonate (ESKALITH) 450 MG CR tablet Take 2 tablets (900 mg total) by mouth at bedtime. 60 tablet 1   ? loratadine (CLARITIN) 10 MG tablet Take 1 tablet (10 mg total) by mouth daily. 30 tablet 1   ? metFORMIN (GLUCOPHAGE) 500 MG tablet Take 1 tablet (500 mg total) by mouth 2 (two) times daily with a meal. 60 tablet 1   ? metoprolol succinate (TOPROL-XL) 100 MG 24 hr tablet Take 1 tablet (100 mg total) by mouth daily. 30 tablet 0   ? OLANZapine (ZYPREXA) 20 MG tablet Take 1 tablet (20 mg total) by mouth at bedtime. 30 tablet 1   ? venlafaxine XR (EFFEXOR-XR) 75 MG 24 hr capsule Take 1 capsule (75 mg total) by mouth daily with breakfast. 30 capsule 1   ? ? ?Patient Stressors: Educational concerns   ?Medication change or noncompliance   ? ?Patient Strengths: Motivation for treatment/growth  ?Religious Affiliation  ? ?Treatment Modalities: Medication Management, Group therapy, Case management,  ?1 to 1 session with clinician, Psychoeducation, Recreational therapy. ? ? ?Physician Treatment Plan for Primary Diagnosis: Schizoaffective disorder ([place]) ?Long Term Goal(s):    ? ?  Short Term Goals:   ? ?Medication Management: Evaluate patient's response, side effects, and tolerance of medication regimen. ? ?Therapeutic Interventions: 1 to 1 sessions, Unit Group sessions and Medication administration. ? ?Evaluation of Outcomes: Not Met ? ?Physician Treatment Plan for Secondary Diagnosis: Principal Problem: ?  Schizoaffective disorder ([place]) ? ?Long Term Goal(s):    ? ?Short Term Goals:      ? ?Medication Management: Evaluate patient's response, side effects, and tolerance of medication regimen. ? ?Therapeutic Interventions: 1 to 1 sessions, Unit Group sessions and Medication administration. ? ?Evaluation of Outcomes: Not Met ? ? ?RN  Treatment Plan for Primary Diagnosis: Schizoaffective disorder ([place]) ?Long Term Goal(s): Knowledge of disease and therapeutic regimen to maintain health will improve ? ?Short Term Goals: Ability to remain free from injury will improve, Ability to verbalize frustration and anger appropriately will improve, Ability to demonstrate self-control, Ability to participate in decision making will improve, Ability to verbalize feelings will improve, Ability to disclose and discuss suicidal ideas, Ability to identify and develop effective coping behaviors will improve, and Compliance with prescribed medications will improve ? ?Medication Management: RN will administer medications as ordered by provider, will assess and evaluate patient's response and provide education to patient for prescribed medication. RN will report any adverse and/or side effects to prescribing provider. ? ?Therapeutic Interventions: 1 on 1 counseling sessions, Psychoeducation, Medication administration, Evaluate responses to treatment, Monitor vital signs and CBGs as ordered, Perform/monitor CIWA, COWS, AIMS and Fall Risk screenings as ordered, Perform wound care treatments as ordered. ? ?Evaluation of Outcomes: Not Met ? ? ?LCSW Treatment Plan for Primary Diagnosis: Schizoaffective disorder ([place]) ?Long Term Goal(s): Safe transition to appropriate next level of care at discharge, Engage patient in therapeutic group addressing interpersonal concerns. ? ?Short Term Goals: Engage patient in aftercare planning with referrals and resources, Increase social support, Increase ability to appropriately verbalize feelings, Increase emotional regulation, Facilitate acceptance of mental health diagnosis and concerns, Facilitate patient progression through stages of change regarding substance use diagnoses and concerns, Identify triggers associated with mental health/substance abuse issues, and Increase skills for wellness and recovery ? ?Therapeutic Interventions:  Assess for all discharge needs, 1 to 1 time with Education officer, museum, Explore available resources and support systems, Assess for adequacy in community support network, Educate family and significant other(s) on suicide prevention, Complete Psychosocial Assessment, Interpersonal group therapy. ? ?Evaluation of Outcomes: Not Met ? ? ?Progress in Treatment: ?Attending groups: No. ?Participating in groups: No. ?Taking medication as prescribed: Yes. ?Toleration medication: Yes. ?Family/Significant other contact made: No, will contact:  CSW will obtain consent to reach family/friend.  ?Patient understands diagnosis: No. ?Discussing patient identified problems/goals with staff: Yes. ?Medical problems stabilized or resolved: Yes. ?Denies suicidal/homicidal ideation: Yes. ?Issues/concerns per patient self-inventory: Yes. ?Other: none ? ?New problem(s) identified: No, Describe:  No additional problems/concerns identified at this time.  ? ?New Short Term/Long Term Goal(s): Patient to work towards elimination of symptoms of psychosis, medication management for mood stabilization; development of comprehensive mental wellness plan. ? ?Patient Goals:  States, "I don't want to do ECT. I don't known . . . I am fine. Don't need anything, I am scared of it. I just said that so you would feel better, I hope I didn't lie to you."  ? ?Discharge Plan or Barriers: No psychosocial barriers identified at this time, patient to return to place of residence when appropriate for discharge.  ? ?Reason for Continuation of Hospitalization: Other; describe disorganized thought/behaviors.  ? ?Estimated Length of Stay:  1-7 days  ? ?Last 3 [place] Suicide Severity Risk Score: ?[place] Admission (Current) from 11/22/2021 in [place] ED from 11/21/2021 in [place] [facility] ED from 11/16/2021 in [place]  ?C-SSRS RISK CATEGORY No Risk No Risk No Risk   ? ?  ? ? ?Last PHQ 2/9 Scores: ?   ? View : No data to display.  ?  ?  ?  ? ? ?Scribe for Treatment Team: ?Larose Kells ?11/23/2021 ?10:34 AM ? ? ?

## 2021-11-23 NOTE — Progress Notes (Addendum)
This Clinical research associate went to get patient for his scheduled Klonopin, and when patient saw this Clinical research associate, he stated "you're that girl I was talking to earlier". This Clinical research associate stated that "I'm your nurse and I have medication for you". Patient came to the medication room and stated to this writer "I'ma hurt you because you're beautiful". This Clinical research associate asked patient why was he going to do this and he stated "because you won't kiss me". This Clinical research associate stated that this is inappropriate, and that he needs to take his medication. Patient then states "I'm not going to hurt you". Patient put the medication in his mouth and then said to this writer "promise me we'll do it one time in [place]". This Clinical research associate stated to patient that when I go to [place], I'm going to be looking for my family and friends. Patient then states that "we can do it one time". Patient continues to ask for multiple cups of water, trying to touch this writer's hand, and he also tried to bend down and kiss this writer's hand, and I pulled my hand back, away from patient. Patient then notices this writer's name badge and states "you're name is Radio producer, my name is Control and instrumentation engineer, we should get married". I told patient that this wouldn't happen, and he then says again "I hate you". This Clinical research associate told patient that hate is a strong word. Patient stated that he wasn't going to bother this writer anymore and started to walk off. Patient then turns around and stated to this writer "can I eat your shit", this writer said no and patient turned around and walked to his room. ?

## 2021-11-23 NOTE — H&P (Signed)
Psychiatric Admission Assessment Adult  Patient Identification: Jonathan Alexander. MRN:  [id number] Date of Evaluation:  11/23/2021 Chief Complaint:  Schizoaffective disorder [facility]) [F25.9] Principal Diagnosis: Schizoaffective disorder (HCC) Diagnosis:  Principal Problem:   Schizoaffective disorder (HCC) Active Problems:   Hypertension  History of Present Illness: Patient seen and chart reviewed.  This is a 36 year old man with a history of schizoaffective disorder.  He was brought to the emergency room from his group home because of worsening disorganized thinking and agitation paranoia hostile statements.  Patient was limited in what information he could provide because of his refusal to cooperate with some of the interview and his disorganized thinking.  I spoke with the manager of his group home and informs me that ever since his Klonopin dose has been tapered he has been gradually getting more agitated and staying up later at night and making more hostile statements.  Patient himself is unable to give much useful history and told me to get out of his room pretty quickly.  He has been making sexually aggressive inappropriate comments to multiple male staff members as well as patients.  He attended treatment team today and was very disorganized and unable to understand the questions even. Associated Signs/Symptoms: Depression Symptoms:  psychomotor agitation, difficulty concentrating, disturbed sleep, Duration of Depression Symptoms: Less than two weeks  (Hypo) Manic Symptoms:  Impulsivity, Irritable Mood, Labiality of Mood, Anxiety Symptoms:  Excessive Worry, Psychotic Symptoms:  Paranoia, PTSD Symptoms: Negative Total Time spent with patient: 1 hour  Past Psychiatric History: Patient has a long history of schizoaffective disorder.  I have been acquainted with the patient for many years because he is irregular on the electroconvulsive therapy service receiving treatment about every  6 weeks on average for maintenance treatment.  He had a lengthy hospitalization at [facility] several years ago before we first met him during which ECT was crucial to improving his manic aggressive behavior.  Since then he has had a few episodes of decompensation most of which have come back under control pretty quickly sometimes with extra ECT sometimes not.  He does have a past history of aggression.  There is no known history of suicide attempts.  Is the patient at risk to self? Yes.    Has the patient been a risk to self in the past 6 months? Yes.    Has the patient been a risk to self within the distant past? Yes.    Is the patient a risk to others? Yes.    Has the patient been a risk to others in the past 6 months? Yes.    Has the patient been a risk to others within the distant past? Yes.     Prior Inpatient Therapy:   Prior Outpatient Therapy:    Alcohol Screening: 1. How often do you have a drink containing alcohol?: Never 2. How many drinks containing alcohol do you have on a typical day when you are drinking?: 1 or 2 3. How often do you have six or more drinks on one occasion?: Never AUDIT-C Score: 0 4. How often during the last year have you found that you were not able to stop drinking once you had started?: Never 5. How often during the last year have you failed to do what was normally expected from you because of drinking?: Never 6. How often during the last year have you needed a first drink in the morning to get yourself going after a heavy drinking session?: Never 7. How often  during the last year have you had a feeling of guilt of remorse after drinking?: Never 8. How often during the last year have you been unable to remember what happened the night before because you had been drinking?: Never 9. Have you or someone else been injured as a result of your drinking?: No 10. Has a relative or friend or a doctor or another health worker been concerned about your drinking  or suggested you cut down?: No Alcohol Use Disorder Identification Test Final Score (AUDIT): 0 Substance Abuse History in the last 12 months:  No. Consequences of Substance Abuse: Negative Previous Psychotropic Medications: Yes  Psychological Evaluations: Yes  Past Medical History:  Past Medical History:  Diagnosis Date   Schizophrenia (HCC)    Tachycardia    History reviewed. No pertinent surgical history. Family History:  Family History  Family history unknown: Yes   Family Psychiatric  History: No information available Tobacco Screening:   Social History:  Social History   Substance and Sexual Activity  Alcohol Use No     Social History   Substance and Sexual Activity  Drug Use No    Additional Social History:                           Allergies:   Allergies  Allergen Reactions   Divalproex Sodium    Shellfish-Derived Products    Lab Results:  Results for orders placed or performed during the hospital encounter of 11/21/21 (from the past 48 hour(s))  Comprehensive metabolic panel     Status: Abnormal   Collection Time: 11/21/21  5:55 PM  Result Value Ref Range   Sodium 138 135 - 145 mmol/L   Potassium 4.2 3.5 - 5.1 mmol/L   Chloride 106 98 - 111 mmol/L   CO2 23 22 - 32 mmol/L   Glucose, Bld 110 (H) 70 - 99 mg/dL    Comment: Glucose reference range applies only to samples taken after fasting for at least 8 hours.   BUN 12 6 - 20 mg/dL   Creatinine, Ser 1.61 0.61 - 1.24 mg/dL   Calcium 09.6 8.9 - 04.5 mg/dL   Total Protein 7.3 6.5 - 8.1 g/dL   Albumin 4.4 3.5 - 5.0 g/dL   AST 14 (L) 15 - 41 U/L   ALT 10 0 - 44 U/L   Alkaline Phosphatase 88 38 - 126 U/L   Total Bilirubin 0.5 0.3 - 1.2 mg/dL   GFR, Estimated >40 >98 mL/min    Comment: (NOTE) Calculated using the CKD-EPI Creatinine Equation (2021)    Anion gap 9 5 - 15    Comment: Performed at [facility], [address], [place], [place] [address]  Ethanol     Status: None    Collection Time: 11/21/21  5:55 PM  Result Value Ref Range   Alcohol, Ethyl (B) <10 <10 mg/dL    Comment: (NOTE) Lowest detectable limit for serum alcohol is 10 mg/dL.  For medical purposes only. Performed at [facility], [address] [place], [place] [address]   Salicylate level     Status: Abnormal   Collection Time: 11/21/21  5:55 PM  Result Value Ref Range   Salicylate Lvl <7.0 (L) 7.0 - 30.0 mg/dL    Comment: Performed at [facility], [address] [place], [place] [address]  Acetaminophen level     Status: Abnormal   Collection Time: 11/21/21  5:55 PM  Result Value  Ref Range   Acetaminophen (Tylenol), Serum <10 (L) 10 - 30 ug/mL    Comment: (NOTE) Therapeutic concentrations vary significantly. A range of 10-30 ug/mL  may be an effective concentration for many patients. However, some  are best treated at concentrations outside of this range. Acetaminophen concentrations >150 ug/mL at 4 hours after ingestion  and >50 ug/mL at 12 hours after ingestion are often associated with  toxic reactions.  Performed at [facility], [address] [place], [place] [address]   cbc     Status: Abnormal   Collection Time: 11/21/21  5:55 PM  Result Value Ref Range   WBC 10.5 4.0 - 10.5 K/uL   RBC 4.29 4.22 - 5.81 MIL/uL   Hemoglobin 12.9 (L) 13.0 - 17.0 g/dL   HCT 40.9 81.1 - 91.4 %   MCV 92.5 80.0 - 100.0 fL   MCH 30.1 26.0 - 34.0 pg   MCHC 32.5 30.0 - 36.0 g/dL   RDW 78.2 95.6 - 21.3 %   Platelets 346 150 - 400 K/uL   nRBC 0.0 0.0 - 0.2 %    Comment: Performed at [facility], [address] [place], [place] [address]  Resp Panel by RT-PCR (Flu A&B, Covid) Nasopharyngeal Swab     Status: None   Collection Time: 11/22/21  4:30 PM   Specimen: Nasopharyngeal Swab; Nasopharyngeal(NP) swabs in vial transport medium  Result Value Ref Range   SARS Coronavirus 2 by RT PCR NEGATIVE NEGATIVE    Comment: (NOTE) SARS-CoV-2 target nucleic  acids are NOT DETECTED.  The SARS-CoV-2 RNA is generally detectable in upper respiratory specimens during the acute phase of infection. The lowest concentration of SARS-CoV-2 viral copies this assay can detect is 138 copies/mL. A negative result does not preclude SARS-Cov-2 infection and should not be used as the sole basis for treatment or other patient management decisions. A negative result may occur with  improper specimen collection/handling, submission of specimen other than nasopharyngeal swab, presence of viral mutation(s) within the areas targeted by this assay, and inadequate number of viral copies(<138 copies/mL). A negative result must be combined with clinical observations, patient history, and epidemiological information. The expected result is Negative.  Fact Sheet for Patients:  BloggerCourse.com  Fact Sheet for Healthcare Providers:  SeriousBroker.it  This test is no t yet approved or cleared by the [place] FDA and  has been authorized for detection and/or diagnosis of SARS-CoV-2 by FDA under an Emergency Use Authorization (EUA). This EUA will remain  in effect (meaning this test can be used) for the duration of the COVID-19 declaration under Section 564(b)(1) of the Act, 21 U.S.C.section 360bbb-3(b)(1), unless the authorization is terminated  or revoked sooner.       Influenza A by PCR NEGATIVE NEGATIVE   Influenza B by PCR NEGATIVE NEGATIVE    Comment: (NOTE) The Xpert Xpress SARS-CoV-2/FLU/RSV plus assay is intended as an aid in the diagnosis of influenza from Nasopharyngeal swab specimens and should not be used as a sole basis for treatment. Nasal washings and aspirates are unacceptable for Xpert Xpress SARS-CoV-2/FLU/RSV testing.  Fact Sheet for Patients: BloggerCourse.com  Fact Sheet for Healthcare Providers: SeriousBroker.it  This test is not  yet approved or cleared by the [place] FDA and has been authorized for detection and/or diagnosis of SARS-CoV-2 by FDA under an Emergency Use Authorization (EUA). This EUA will remain in effect (meaning this test can be used) for the duration of the COVID-19 declaration under Section 564(b)(1) of  the Act, 21 U.S.C. section 360bbb-3(b)(1), unless the authorization is terminated or revoked.  Performed at [facility], [address] [place], [place] [address]     Blood Alcohol level:  Lab Results  Component Value Date   [facility] <10 11/21/2021   ETH <10 10/23/2021    Metabolic Disorder Labs:  Lab Results  Component Value Date   HGBA1C 5.0 02/09/2018   MPG 97 02/09/2018   No results found for: PROLACTIN Lab Results  Component Value Date   CHOL 167 02/09/2018   TRIG 169 (H) 02/09/2018   HDL 38 (L) 02/09/2018   CHOLHDL 4.4 02/09/2018   VLDL 34 02/09/2018   LDLCALC 95 02/09/2018    Current Medications: Current Facility-Administered Medications  Medication Dose Route Frequency Provider Last Rate Last Admin   acetaminophen (TYLENOL) tablet 650 mg  650 mg Oral Q6H PRN Vanetta Mulders, NP       albuterol (VENTOLIN HFA) 108 (90 Base) MCG/ACT inhaler 2 puff  2 puff Inhalation Q6H PRN Omer Monter, Jackquline Denmark, MD       alum & mag hydroxide-simeth (MAALOX/MYLANTA) 200-200-20 MG/5ML suspension 30 mL  30 mL Oral Q4H PRN Gabriel Cirri F, NP       atorvastatin (LIPITOR) tablet 10 mg  10 mg Oral QHS Joshue Badal T, MD       clonazePAM (KLONOPIN) tablet 0.5 mg  0.5 mg Oral TID Amisha Pospisil, Jackquline Denmark, MD       cloZAPine (CLOZARIL) tablet 400 mg  400 mg Oral QHS Earnestine Shipp T, MD       desmopressin (DDAVP) tablet 0.2 mg  0.2 mg Oral QHS Kenidee Cregan T, MD       imipramine (TOFRANIL) tablet 100 mg  100 mg Oral QHS Velita Quirk, Jackquline Denmark, MD       lithium carbonate (ESKALITH) CR tablet 450 mg  450 mg Oral Q12H Tannah Dreyfuss T, MD   450 mg at 11/23/21 1210   loratadine (CLARITIN) tablet 10 mg   10 mg Oral Daily Appolonia Ackert, Jackquline Denmark, MD   10 mg at 11/23/21 1210   magnesium hydroxide (MILK OF MAGNESIA) suspension 30 mL  30 mL Oral Daily PRN Gabriel Cirri F, NP       metFORMIN (GLUCOPHAGE) tablet 500 mg  500 mg Oral BID WC Justis Closser, Jackquline Denmark, MD       metoprolol succinate (TOPROL-XL) 24 hr tablet 100 mg  100 mg Oral Daily Gabriel Cirri F, NP   100 mg at 11/23/21 0814   OLANZapine (ZYPREXA) tablet 20 mg  20 mg Oral QHS Diane Hanel, Jackquline Denmark, MD       Melene Muller ON 11/24/2021] venlafaxine XR (EFFEXOR-XR) 24 hr capsule 75 mg  75 mg Oral Q breakfast Jaeleen Inzunza T, MD       PTA Medications: Medications Prior to Admission  Medication Sig Dispense Refill Last Dose   albuterol (VENTOLIN HFA) 108 (90 Base) MCG/ACT inhaler Inhale 2 puffs into the lungs every 6 (six) hours as needed for wheezing or shortness of breath. 8 g 0    atorvastatin (LIPITOR) 10 MG tablet Take 10 mg by mouth at bedtime.      chlorhexidine (PERIDEX) 0.12 % solution Use as directed 15 mLs in the mouth or throat 2 (two) times daily.      clonazePAM (KLONOPIN) 1 MG tablet Take 1 tablet (1 mg total) by mouth 3 (three) times daily. (Patient not taking: Reported on 11/22/2021) 90 tablet 1    cloZAPine (CLOZARIL) 100 MG tablet  TAKE 3 AND 1/2 TABLETS (350MG ) BY MOUTH AT BEDTIME (Patient not taking: Reported on 11/22/2021) 25 tablet 10    clozapine (CLOZARIL) 200 MG tablet Take 400 mg by mouth at bedtime.      desmopressin (DDAVP) 0.2 MG tablet Take 1 tablet (0.2 mg total) by mouth at bedtime. 30 tablet 1    EPINEPHrine 0.3 mg/0.3 mL IJ SOAJ injection Inject 0.3 mg into the muscle as needed for anaphylaxis.      ferrous sulfate 325 (65 FE) MG tablet Take 1 tablet (325 mg total) by mouth daily with breakfast. (Patient not taking: Reported on 11/22/2021) 30 tablet 1    imipramine (TOFRANIL) 50 MG tablet Take 2 tablets (100 mg total) by mouth at bedtime. 30 tablet 1    lithium carbonate (ESKALITH) 450 MG CR tablet Take 2 tablets (900 mg total) by mouth  at bedtime. 60 tablet 1    loratadine (CLARITIN) 10 MG tablet Take 1 tablet (10 mg total) by mouth daily. 30 tablet 1    metFORMIN (GLUCOPHAGE) 500 MG tablet Take 1 tablet (500 mg total) by mouth 2 (two) times daily with a meal. 60 tablet 1    metoprolol succinate (TOPROL-XL) 100 MG 24 hr tablet Take 1 tablet (100 mg total) by mouth daily. 30 tablet 0    OLANZapine (ZYPREXA) 20 MG tablet Take 1 tablet (20 mg total) by mouth at bedtime. 30 tablet 1    venlafaxine XR (EFFEXOR-XR) 75 MG 24 hr capsule Take 1 capsule (75 mg total) by mouth daily with breakfast. 30 capsule 1     Musculoskeletal: Strength & Muscle Tone: within normal limits Gait & Station: normal Patient leans: N/A            Psychiatric Specialty Exam:  Presentation  General Appearance: Appropriate for Environment  Eye Contact:Good  Speech:Clear and Coherent  Speech Volume:Normal  Handedness:Right   Mood and Affect  Mood:Euthymic  Affect:Appropriate; Congruent   Thought Process  Thought Processes:Coherent  Duration of Psychotic Symptoms: N/A  Past Diagnosis of Schizophrenia or Psychoactive disorder: Yes  Descriptions of Associations:Intact  Orientation:Full (Time, Place and Person)  Thought Content:Logical; WDL  Hallucinations:No data recorded Ideas of Reference:None  Suicidal Thoughts:No data recorded Homicidal Thoughts:No data recorded  Sensorium  Memory:Immediate Good; Remote Good; Recent Good  Judgment:Fair  Insight:Fair   Executive Functions  Concentration:Fair  Attention Span:Fair  Recall:Fair  Fund of Knowledge:Fair  Language:Fair   Psychomotor Activity  Psychomotor Activity:No data recorded  Assets  Assets:Communication Skills; Desire for Improvement; Financial Resources/Insurance; Leisure Time; Social Support; Talents/Skills; Vocational/Educational   Sleep  Sleep:No data recorded   Physical Exam: Physical Exam Vitals and nursing note reviewed.   Constitutional:      Appearance: Normal appearance.  HENT:     Head: Normocephalic and atraumatic.     Mouth/Throat:     Pharynx: Oropharynx is clear.  Eyes:     Pupils: Pupils are equal, round, and reactive to light.  Cardiovascular:     Rate and Rhythm: Normal rate and regular rhythm.  Pulmonary:     Effort: Pulmonary effort is normal.     Breath sounds: Normal breath sounds.  Abdominal:     General: Abdomen is flat.     Palpations: Abdomen is soft.  Musculoskeletal:        General: Normal range of motion.  Skin:    General: Skin is warm and dry.  Neurological:     General: No focal deficit present.     Mental Status:  He is alert. Mental status is at baseline.  Psychiatric:        Attention and Perception: He is inattentive.        Mood and Affect: Affect is labile and inappropriate.        Speech: He is noncommunicative. Speech is tangential.        Behavior: Behavior is agitated and withdrawn. Behavior is not aggressive.        Thought Content: Thought content is paranoid.        Cognition and Memory: Cognition is impaired.        Judgment: Judgment is inappropriate.   Review of Systems  Constitutional: Negative.   HENT: Negative.    Eyes: Negative.   Respiratory: Negative.    Cardiovascular: Negative.   Gastrointestinal: Negative.   Musculoskeletal: Negative.   Skin: Negative.   Neurological: Negative.   Psychiatric/Behavioral: Negative.    Blood pressure (!) 127/97, pulse (!) 108, temperature 99.2 F (37.3 C), temperature source Oral, resp. rate 16, height 5\' 9"  (1.753 m), weight 92.5 kg, SpO2 98 %. Body mass index is 30.13 kg/m.  Treatment Plan Summary: Daily contact with patient to assess and evaluate symptoms and progress in treatment, Medication management, and Plan patient is usually on clozapine and lithium and other medications.  I think I have his medicines right but I am asking the group home to fax the information over so that we can confirm that.   Restart medicine.  I have spoken with the nursing staff today.  There is concerned about his inappropriate sexual statements.  He has not done anything physically aggressive yet but we are going to keep a close eye on that and nursing may need to put some restrictions on his movement and behavior if he cannot be more appropriate.  We are going to put him on the schedule for ECT for Monday.  Continue blood pressure medicine and continue other medical treatment.  Observation Level/Precautions:  15 minute checks  Laboratory:  HbAIC  Psychotherapy:    Medications:    Consultations:    Discharge Concerns:    Estimated LOS:  Other:     Physician Treatment Plan for Primary Diagnosis: Schizoaffective disorder (HCC) Long Term Goal(s): Improvement in symptoms so as ready for discharge  Short Term Goals: Ability to verbalize feelings will improve, Ability to demonstrate self-control will improve, Ability to identify and develop effective coping behaviors will improve, Ability to maintain clinical measurements within normal limits will improve, and Compliance with prescribed medications will improve  Physician Treatment Plan for Secondary Diagnosis: Principal Problem:   Schizoaffective disorder (HCC) Active Problems:   Hypertension  Long Term Goal(s): Improvement in symptoms so as ready for discharge  Short Term Goals: Ability to maintain clinical measurements within normal limits will improve and Compliance with prescribed medications will improve  I certify that inpatient services furnished can reasonably be expected to improve the patient's condition.    [name], MD 4/21/202312:23 PM

## 2021-11-23 NOTE — Progress Notes (Signed)
This Probation officer was notified by [employer] staff that when they went into patient's room to clean, he stated to her "you're so pretty, can I have a kiss". It was also stated that patient was asking the male members on the unit if he could have a kiss. Staff informed patient that this is not appropriate. ?

## 2021-11-23 NOTE — Consult Note (Signed)
Pharmacy - Clozapine   ?  ?This patient's order has been reviewed for prescribing contraindications.  ? ?Clozapine REMS enrollment Verified: yes 11/23/21 ?REMS ID#: [id number] ?Current Outpatient Monitoring: Monthly ? ?Home Regimen:  ?Clozapine 400 mg PO qHS, verified 4/21. Patient has not missed doses  ? ? ?Dose Adjustments This Admission: ? ? ?Labs:   ?4/19 WBC 10.5 ? ? ? ? ? ?Plan: ?Continue with scheduled dose of 400 mg qHS  ?Continue with weekly ANC labs while inpatient, with first ANC tomorrow 11/24/21 ? ?**The medication is being dispensed pursuant to the FDA REMS suspension order of 06/23/20 that allows for dispensing without a patient REMS dispense authorization (RDA).  ? ? ?Sharen Hones, PharmD, BCPS ?Clinical Pharmacist  ?

## 2021-11-23 NOTE — Progress Notes (Signed)
Patient came to the nurses station, asking this Clinical research associate when dinner would be coming. This Clinical research associate explained that dinner would be coming around 430 pm. Patient verbalized understanding and then proceeded to ask this writer if we're cool. This Clinical research associate stated that everything is fine if he continues to be appropriate and he then states "are we friends", and this Clinical research associate stated again we are fine if you continue to be appropriate. Patient stated ok and walked back to his room.  ?

## 2021-11-24 DIAGNOSIS — F250 Schizoaffective disorder, bipolar type: Secondary | ICD-10-CM | POA: Diagnosis not present

## 2021-11-24 LAB — CBC WITH DIFFERENTIAL/PLATELET
Abs Immature Granulocytes: 0.04 10*3/uL (ref 0.00–0.07)
Basophils Absolute: 0 10*3/uL (ref 0.0–0.1)
Basophils Relative: 0 %
Eosinophils Absolute: 0 10*3/uL (ref 0.0–0.5)
Eosinophils Relative: 0 %
HCT: 41.8 % (ref 39.0–52.0)
Hemoglobin: 13.3 g/dL (ref 13.0–17.0)
Immature Granulocytes: 0 %
Lymphocytes Relative: 28 %
Lymphs Abs: 2.7 10*3/uL (ref 0.7–4.0)
MCH: 30 pg (ref 26.0–34.0)
MCHC: 31.8 g/dL (ref 30.0–36.0)
MCV: 94.4 fL (ref 80.0–100.0)
Monocytes Absolute: 0.9 10*3/uL (ref 0.1–1.0)
Monocytes Relative: 9 %
Neutro Abs: 6 10*3/uL (ref 1.7–7.7)
Neutrophils Relative %: 63 %
Platelets: 306 10*3/uL (ref 150–400)
RBC: 4.43 MIL/uL (ref 4.22–5.81)
RDW: 13.1 % (ref 11.5–15.5)
WBC: 9.6 10*3/uL (ref 4.0–10.5)
nRBC: 0 % (ref 0.0–0.2)

## 2021-11-24 NOTE — Plan of Care (Signed)
D: Pt alert and oriented. Pt denies experiencing any anxiety/depression at this time. Pt denies experiencing any pain at this time. Pt denies experiencing any SI/HI, or AVH at this time.  ? ?Pt continues to be sexually inappropriate, asking other patients if he can kiss them. ? ?A: Scheduled medications administered to pt, per MD orders. Support and encouragement provided. Frequent verbal contact made. Routine safety checks conducted q15 minutes.  ? ?R: No adverse drug reactions noted. Pt verbally contracts for safety at this time. Pt compliant with medications. Pt interacts poorly with others on the unit. Pt remains safe at this time. Will continue to monitor.  ? ?Problem: Education: ?Goal: Mental status will improve ?Outcome: Not Progressing ?  ?Problem: Education: ?Goal: Will be free of psychotic symptoms ?Outcome: Not Progressing ?  ?

## 2021-11-24 NOTE — Progress Notes (Signed)
[facility] MD Progress Note ? ?11/24/2021 9:44 AM ?Jonathan Horton MarshallSantana Jr.  ?MRN:  [id number] ?Subjective:   ?Patient was initially seen for psychiatric evaluation on November 23, 2021.  He has a primary diagnosis of schizoaffective disorder.  Patient was noted to be agitated and paranoid. ? ?Today, the patient was seen in his room.  He speaks soft and faintly.  Nods no that he did not sleep well last night due to "bad dreams."  Feels "mad and disappointed" but does not elaborate.  Denies symptoms of psychosis.  No new medical issues.  He is compliant with taking his medications on the unit. ? ? ?Principal Problem: Schizoaffective disorder (HCC) ?Diagnosis: Principal Problem: ?  Schizoaffective disorder (HCC) ?Active Problems: ?  Hypertension ? ?Total Time spent with patient: 20 minutes ? ?Past Medical History:  ?Past Medical History:  ?Diagnosis Date  ? Schizophrenia (HCC)   ? Tachycardia   ? History reviewed. No pertinent surgical history. ?Family History:  ?Family History  ?Family history unknown: Yes  ? ? ?Social History:  ?Social History  ? ?Substance and Sexual Activity  ?Alcohol Use No  ?   ?Social History  ? ?Substance and Sexual Activity  ?Drug Use No  ?  ?Social History  ? ?Socioeconomic History  ? Marital status: Single  ?  Spouse name: Not on file  ? Number of children: Not on file  ? Years of education: Not on file  ? Highest education level: Not on file  ?Occupational History  ? Not on file  ?Tobacco Use  ? Smoking status: Every Day  ?  Packs/day: 1.00  ?  Years: 4.00  ?  Pack years: 4.00  ?  Types: Cigarettes  ? Smokeless tobacco: Never  ?Substance and Sexual Activity  ? Alcohol use: No  ? Drug use: No  ? Sexual activity: Never  ?Other Topics Concern  ? Not on file  ?Social History Narrative  ? Not on file  ? ?Social Determinants of Health  ? ?Financial Resource Strain: Not on file  ?Food Insecurity: Not on file  ?Transportation Needs: Not on file  ?Physical Activity: Not on file  ?Stress: Not on file  ?Social  Connections: Not on file  ? ?Additional Social History:  ?  ?  ?  ?  ?  ?  ?  ?  ?  ?  ?  ? ?Sleep: Poor ? ?Appetite:  Fair ? ?Current Medications: ?Current Facility-Administered Medications  ?Medication Dose Route Frequency Provider Last Rate Last Admin  ? acetaminophen (TYLENOL) tablet 650 mg  650 mg Oral Q6H PRN Vanetta MuldersBarthold, Louise F, NP      ? albuterol (VENTOLIN HFA) 108 (90 Base) MCG/ACT inhaler 2 puff  2 puff Inhalation Q6H PRN Clapacs, Jackquline DenmarkJohn T, MD      ? alum & mag hydroxide-simeth (MAALOX/MYLANTA) 200-200-20 MG/5ML suspension 30 mL  30 mL Oral Q4H PRN Gabriel CirriBarthold, Louise F, NP      ? atorvastatin (LIPITOR) tablet 10 mg  10 mg Oral QHS Clapacs, Jackquline DenmarkJohn T, MD   10 mg at 11/23/21 2126  ? clonazePAM (KLONOPIN) tablet 0.5 mg  0.5 mg Oral TID Clapacs, Jackquline DenmarkJohn T, MD   0.5 mg at 11/24/21 0736  ? cloZAPine (CLOZARIL) tablet 400 mg  400 mg Oral QHS Clapacs, Jackquline DenmarkJohn T, MD   400 mg at 11/23/21 2126  ? desmopressin (DDAVP) tablet 0.2 mg  0.2 mg Oral QHS Clapacs, John T, MD   0.2 mg at 11/23/21 2127  ? imipramine (TOFRANIL) tablet 100  mg  100 mg Oral QHS Clapacs, Jackquline Denmark, MD   100 mg at 11/23/21 2126  ? lithium carbonate (ESKALITH) CR tablet 450 mg  450 mg Oral Q12H Clapacs, Jackquline Denmark, MD   450 mg at 11/24/21 2585  ? loratadine (CLARITIN) tablet 10 mg  10 mg Oral Daily Clapacs, John T, MD   10 mg at 11/24/21 2778  ? magnesium hydroxide (MILK OF MAGNESIA) suspension 30 mL  30 mL Oral Daily PRN Gabriel Cirri F, NP      ? metFORMIN (GLUCOPHAGE) tablet 500 mg  500 mg Oral BID WC Clapacs, Jackquline Denmark, MD   500 mg at 11/24/21 0736  ? metoprolol succinate (TOPROL-XL) 24 hr tablet 100 mg  100 mg Oral Daily Gabriel Cirri F, NP   100 mg at 11/24/21 2423  ? OLANZapine (ZYPREXA) tablet 20 mg  20 mg Oral QHS Clapacs, John T, MD   20 mg at 11/23/21 2127  ? venlafaxine XR (EFFEXOR-XR) 24 hr capsule 75 mg  75 mg Oral Q breakfast Clapacs, Jackquline Denmark, MD   75 mg at 11/24/21 5361  ? ? ?Lab Results:  ?Results for orders placed or performed during the hospital  encounter of 11/22/21 (from the past 48 hour(s))  ?Hemoglobin A1c     Status: None  ? Collection Time: 11/23/21  1:22 PM  ?Result Value Ref Range  ? Hgb A1c MFr Bld 4.8 4.8 - 5.6 %  ?  Comment: (NOTE) ?Pre diabetes:          5.7%-6.4% ? ?Diabetes:              >6.4% ? ?Glycemic control for   <7.0% ?adults with diabetes ?  ? Mean Plasma Glucose 91.06 mg/dL  ?  Comment: Performed at [facility], [address], [place], [place] [address]  ?Lipid panel     Status: Abnormal  ? Collection Time: 11/23/21  1:22 PM  ?Result Value Ref Range  ? Cholesterol 95 0 - 200 mg/dL  ? Triglycerides 107 <150 mg/dL  ? HDL 29 (L) >40 mg/dL  ? Total CHOL/HDL Ratio 3.3 RATIO  ? VLDL 21 0 - 40 mg/dL  ? LDL Cholesterol 45 0 - 99 mg/dL  ?  Comment:        ?Total Cholesterol/HDL:CHD Risk ?Coronary Heart Disease Risk Table ?                    Men   Women ? 1/2 Average Risk   3.4   3.3 ? Average Risk       5.0   4.4 ? 2 X Average Risk   9.6   7.1 ? 3 X Average Risk  23.4   11.0 ?       ?Use the calculated Patient Ratio ?above and the CHD Risk Table ?to determine the patient's CHD Risk. ?       ?ATP III CLASSIFICATION (LDL): ? <100     mg/dL   Optimal ? 400-867  mg/dL   Near or Above ?                   Optimal ? 130-159  mg/dL   Borderline ? 160-189  mg/dL   High ? >619     mg/dL   Very High ?Performed at [facility], [address] [place], [place] [address] ?  ? ? ?Blood Alcohol level:  ?Lab Results  ?Component Value Date  ? ETH <10 11/21/2021  ?  ETH <10 10/23/2021  ? ? ?Metabolic Disorder Labs: ?Lab Results  ?Component Value Date  ? HGBA1C 4.8 11/23/2021  ? MPG 91.06 11/23/2021  ? MPG 97 02/09/2018  ? ?No results found for: PROLACTIN ?Lab Results  ?Component Value Date  ? CHOL 95 11/23/2021  ? TRIG 107 11/23/2021  ? HDL 29 (L) 11/23/2021  ? CHOLHDL 3.3 11/23/2021  ? VLDL 21 11/23/2021  ? LDLCALC 45 11/23/2021  ? LDLCALC 95 02/09/2018  ? ? ?Physical Findings: ?AIMS:  , ,  ,  ,    ?CIWA:    ?COWS:     ? ?Musculoskeletal: ?Strength & Muscle Tone: within normal limits ?Gait & Station: normal ?Patient leans: Right ? ?Psychiatric Specialty Exam: ? ?Presentation  ?General Appearance: Appropriate for Environment ? ?Eye Contact:Good ? ?Speech:Clear and Coherent ? ?Speech Volume:Normal ? ?Handedness:Right ? ? ?Mood and Affect  ?Mood:mad ?Affect:restricted ? ?Thought Process  ?Thought Processes:Coherent ? ?Descriptions of Associations:Intact ? ?Orientation:Full (Time, Place and Person) ? ?Thought Content:Logical; WDL ? ?History of Schizophrenia/Schizoaffective disorder:Yes ? ?Duration of Psychotic Symptoms:N/A ? ?Sensorium  ?Memory:Immediate Good; Remote Good; Recent Good ? ?Judgment:poor ?Insight:poor ? ?Executive Functions  ?Concentration:Fair ? ?Attention Span:Fair ? ?Recall:Fair ? ?Fund of Knowledge:Fair ? ?Language:Fair ? ? ?Psychomotor Activity  ?Psychomotor Activity:No data recorded ? ?Assets  ?Assets:Communication Skills; Desire for Improvement; Financial Resources/Insurance; Leisure Time; Social Support; Talents/Skills; Vocational/Educational ? ? ?Sleep  ?Sleep:No data recorded ? ? ?Physical Exam: ?Physical Exam ?ROS ?Blood pressure (!) 131/91, pulse (!) 109, temperature 98.4 ?F (36.9 ?C), temperature source Oral, resp. rate 18, height 5\' 9"  (1.753 m), weight 92.5 kg, SpO2 100 %. Body mass index is 30.13 kg/m?. ? ? ?Treatment Plan Summary: ?Medication management ?4/21 ?Daily contact with patient to assess and evaluate symptoms and progress in treatment, Medication management, and Plan patient is usually on clozapine and lithium and other medications.  I think I have his medicines right but I am asking the group home to fax the information over so that we can confirm that.  Restart medicine.  I have spoken with the nursing staff today.  There is concerned about his inappropriate sexual statements.  He has not done anything physically aggressive yet but we are going to keep a close eye on that and nursing may need  to put some restrictions on his movement and behavior if he cannot be more appropriate.  We are going to put him on the schedule for ECT for Monday.  Continue blood pressure medicine and continue other medical treatment. ? ?4/22 ?No

## 2021-11-24 NOTE — Group Note (Signed)
LCSW Group Therapy Note ? ?Group Date: 11/24/2021 ?Start Time: 1315 ?End Time: 1415 ? ? ?Type of Therapy and Topic:  Group Therapy - Healthy vs Unhealthy Coping Skills ? ?Participation Level:  Did Not Attend  ? ?Description of Group ?The focus of this group was to determine what unhealthy coping techniques typically are used by group members and what healthy coping techniques would be helpful in coping with various problems. Patients were guided in becoming aware of the differences between healthy and unhealthy coping techniques. Patients were asked to identify 2-3 healthy coping skills they would like to learn to use more effectively. ? ?Therapeutic Goals ?Patients learned that coping is what human beings do all day long to deal with various situations in their lives ?Patients defined and discussed healthy vs unhealthy coping techniques ?Patients identified their preferred coping techniques and identified whether these were healthy or unhealthy ?Patients determined 2-3 healthy coping skills they would like to become more familiar with and use more often. ?Patients provided support and ideas to each other ? ? ?Summary of Patient Progress: Patient was observed asleep in his room during the time group was held. Patient is not appropriate for group setting at this time.  ? ? ?Therapeutic Modalities ?Cognitive Behavioral Therapy ?Motivational Interviewing ? ?Ileana Ladd Virgin, LCSWA ?11/24/2021  3:03 PM   ?

## 2021-11-25 ENCOUNTER — Encounter: Payer: Self-pay | Admitting: Psychiatry

## 2021-11-25 DIAGNOSIS — F250 Schizoaffective disorder, bipolar type: Secondary | ICD-10-CM | POA: Diagnosis not present

## 2021-11-25 MED ORDER — KETOTIFEN FUMARATE 0.025 % OP SOLN
2.0000 [drp] | OPHTHALMIC | Status: DC | PRN
  Administered 2021-11-25: 2 [drp] via OPHTHALMIC
  Filled 2021-11-25: qty 5

## 2021-11-25 NOTE — Progress Notes (Signed)
Patient has been cooperative with treatment, he was compliant with medication regime, he denies SI/HI & AVH. No new behavioral issues to report on shift at this time.  ?

## 2021-11-25 NOTE — Group Note (Signed)
[facility] LCSW Group Therapy Note ? ? ?Group Date: 11/25/2021 ?Start Time: 1330 ?End Time: 1430 ? ? ?Type of Therapy and Topic: Group Therapy: Avoiding Self-Sabotaging and Enabling Behaviors ? ?Participation Level: Did Not Attend ? ?Description of Group:  ?In this group, patients will learn how to identify obstacles, self-sabotaging and enabling behaviors, as well as: what are they, why do we do them and what needs these behaviors meet. Discuss unhealthy relationships and how to have positive healthy boundaries with those that sabotage and enable. Explore aspects of self-sabotage and enabling in yourself and how to limit these self-destructive behaviors in everyday life. ? ? ?Therapeutic Goals: ?1. Patient will identify one obstacle that relates to self-sabotage and enabling behaviors ?2. Patient will identify one personal self-sabotaging or enabling behavior they did prior to admission ?3. Patient will state a plan to change the above identified behavior ?4. Patient will demonstrate ability to communicate their needs through discussion and/or role play.  ? ? ?Summary of Patient Progress: Patient did not attend group despite encouraged participation. ? ? ?Therapeutic Modalities:  ?Cognitive Behavioral Therapy ?Person-Centered Therapy ?Motivational Interviewing ? ? ? ?Jett Fukuda K Tyreonna Czaplicki, LCSWA ?

## 2021-11-25 NOTE — Plan of Care (Signed)
D: Pt alert and oriented. Pt denies experiencing any anxiety/depression at this time. Pt denies experiencing any pain at this time. Pt denies experiencing any SI/HI, or AVH at this time.  ? ?Pt has poor hygiene, and has been encourage multiple times to shower d/t smelling like urine. Pt is still sexually inappropriate at times, however not frequently in comparison to Friday. It's also found that this pt has difficulty flushing his toilet and needs assistance with it. ? ?A: Scheduled medications administered to pt, per MD orders. Support and encouragement provided. Frequent verbal contact made. Routine safety checks conducted q15 minutes.  ? ?R: No adverse drug reactions noted. Pt verbally contracts for safety at this time. Pt compliant with medications. Pt interacts poorly with others on the unit, mostly self isolating to room with exception to meals and medications. Pt remains safe at this time. Will continue to monitor.  ? ?Problem: Education: ?Goal: Knowledge of [place] [facility] Education information/materials will improve ?Outcome: Progressing ?  ?Problem: Safety: ?Goal: Ability to redirect hostility and anger into socially appropriate behaviors will improve ?Outcome: Progressing ?  ?

## 2021-11-25 NOTE — Progress Notes (Addendum)
[facility] MD Progress Note ? ?11/25/2021 9:23 AM ?Jonathan Alexander.  ?MRN:  [id number] ?Subjective:   ?Patient was initially seen for psychiatric evaluation on November 23, 2021.  He has a primary diagnosis of schizoaffective disorder.  Patient was noted to be agitated and paranoid. ? ?4/23 ?Patient flirted with another patient yesterday evening.  Male patient reported this to staff and nursing but by the time they were able to approach the patient in the cafeteria, patient had already left to go to his room.  Today, patient is drowsy, nods "no" to psychosis, suicide, and depression.  Reports that he did sleep well last night. ? ?4/22 ?Today, the patient was seen in his room.  He speaks soft and faintly.  Nods no that he did not sleep well last night due to "bad dreams."  Feels "mad and disappointed" but does not elaborate.  Denies symptoms of psychosis.  No new medical issues.  He is compliant with taking his medications on the unit. ? ? ?Principal Problem: Schizoaffective disorder (HCC) ?Diagnosis: Principal Problem: ?  Schizoaffective disorder (HCC) ?Active Problems: ?  Hypertension ? ?Total Time spent with patient: 23 minutes ? ?Past Medical History:  ?Past Medical History:  ?Diagnosis Date  ? Schizophrenia (HCC)   ? Tachycardia   ? History reviewed. No pertinent surgical history. ?Family History:  ?Family History  ?Family history unknown: Yes  ? ? ?Social History:  ?Social History  ? ?Substance and Sexual Activity  ?Alcohol Use No  ?   ?Social History  ? ?Substance and Sexual Activity  ?Drug Use No  ?  ?Social History  ? ?Socioeconomic History  ? Marital status: Single  ?  Spouse name: Not on file  ? Number of children: Not on file  ? Years of education: Not on file  ? Highest education level: Not on file  ?Occupational History  ? Not on file  ?Tobacco Use  ? Smoking status: Every Day  ?  Packs/day: 1.00  ?  Years: 4.00  ?  Pack years: 4.00  ?  Types: Cigarettes  ? Smokeless tobacco: Never  ?Substance and Sexual  Activity  ? Alcohol use: No  ? Drug use: No  ? Sexual activity: Never  ?Other Topics Concern  ? Not on file  ?Social History Narrative  ? Not on file  ? ?Social Determinants of Health  ? ?Financial Resource Strain: Not on file  ?Food Insecurity: Not on file  ?Transportation Needs: Not on file  ?Physical Activity: Not on file  ?Stress: Not on file  ?Social Connections: Not on file  ? ?Additional Social History:  ?  ?  ?  ?  ?  ?  ?  ?  ?  ?  ?  ? ?Sleep: Poor ? ?Appetite:  Fair ? ?Current Medications: ?Current Facility-Administered Medications  ?Medication Dose Route Frequency Provider Last Rate Last Admin  ? acetaminophen (TYLENOL) tablet 650 mg  650 mg Oral Q6H PRN Vanetta Mulders, NP      ? albuterol (VENTOLIN HFA) 108 (90 Base) MCG/ACT inhaler 2 puff  2 puff Inhalation Q6H PRN Clapacs, Jackquline Denmark, MD      ? alum & mag hydroxide-simeth (MAALOX/MYLANTA) 200-200-20 MG/5ML suspension 30 mL  30 mL Oral Q4H PRN Gabriel Cirri F, NP      ? atorvastatin (LIPITOR) tablet 10 mg  10 mg Oral QHS Clapacs, Jackquline Denmark, MD   10 mg at 11/24/21 2122  ? clonazePAM (KLONOPIN) tablet 0.5 mg  0.5 mg Oral TID  Clapacs, Jackquline Denmark, MD   0.5 mg at 11/25/21 4268  ? cloZAPine (CLOZARIL) tablet 400 mg  400 mg Oral QHS Clapacs, Jackquline Denmark, MD   400 mg at 11/24/21 2121  ? desmopressin (DDAVP) tablet 0.2 mg  0.2 mg Oral QHS Clapacs, John T, MD   0.2 mg at 11/24/21 2123  ? imipramine (TOFRANIL) tablet 100 mg  100 mg Oral QHS Clapacs, Jackquline Denmark, MD   100 mg at 11/24/21 2123  ? lithium carbonate (ESKALITH) CR tablet 450 mg  450 mg Oral Q12H Clapacs, Jackquline Denmark, MD   450 mg at 11/25/21 3419  ? loratadine (CLARITIN) tablet 10 mg  10 mg Oral Daily Clapacs, John T, MD   10 mg at 11/25/21 6222  ? magnesium hydroxide (MILK OF MAGNESIA) suspension 30 mL  30 mL Oral Daily PRN Gabriel Cirri F, NP      ? metFORMIN (GLUCOPHAGE) tablet 500 mg  500 mg Oral BID WC Clapacs, Jackquline Denmark, MD   500 mg at 11/25/21 9798  ? metoprolol succinate (TOPROL-XL) 24 hr tablet 100 mg  100 mg  Oral Daily Gabriel Cirri F, NP   100 mg at 11/24/21 9211  ? OLANZapine (ZYPREXA) tablet 20 mg  20 mg Oral QHS Clapacs, Jackquline Denmark, MD   20 mg at 11/24/21 2121  ? venlafaxine XR (EFFEXOR-XR) 24 hr capsule 75 mg  75 mg Oral Q breakfast Clapacs, Jackquline Denmark, MD   75 mg at 11/25/21 9417  ? ? ?Lab Results:  ?Results for orders placed or performed during the hospital encounter of 11/22/21 (from the past 48 hour(s))  ?Hemoglobin A1c     Status: None  ? Collection Time: 11/23/21  1:22 PM  ?Result Value Ref Range  ? Hgb A1c MFr Bld 4.8 4.8 - 5.6 %  ?  Comment: (NOTE) ?Pre diabetes:          5.7%-6.4% ? ?Diabetes:              >6.4% ? ?Glycemic control for   <7.0% ?adults with diabetes ?  ? Mean Plasma Glucose 91.06 mg/dL  ?  Comment: Performed at [facility], [address] [place], [place] [address]  ?Lipid panel     Status: Abnormal  ? Collection Time: 11/23/21  1:22 PM  ?Result Value Ref Range  ? Cholesterol 95 0 - 200 mg/dL  ? Triglycerides 107 <150 mg/dL  ? HDL 29 (L) >40 mg/dL  ? Total CHOL/HDL Ratio 3.3 RATIO  ? VLDL 21 0 - 40 mg/dL  ? LDL Cholesterol 45 0 - 99 mg/dL  ?  Comment:        ?Total Cholesterol/HDL:CHD Risk ?Coronary Heart Disease Risk Table ?                    Men   Women ? 1/2 Average Risk   3.4   3.3 ? Average Risk       5.0   4.4 ? 2 X Average Risk   9.6   7.1 ? 3 X Average Risk  23.4   11.0 ?       ?Use the calculated Patient Ratio ?above and the CHD Risk Table ?to determine the patient's CHD Risk. ?       ?ATP III CLASSIFICATION (LDL): ? <100     mg/dL   Optimal ? 481-856  mg/dL   Near or Above ?  Optimal ? 130-159  mg/dL   Borderline ? 160-189  mg/dL   High ? >161>190     mg/dL   Very High ?Performed at [facility], [address] [place], [place] [address] ?  ?CBC with Differential/Platelet     Status: None  ? Collection Time: 11/24/21 11:52 AM  ?Result Value Ref Range  ? WBC 9.6 4.0 - 10.5 K/uL  ? RBC 4.43 4.22 - 5.81 MIL/uL  ? Hemoglobin 13.3 13.0 - 17.0 g/dL  ? HCT  41.8 39.0 - 52.0 %  ? MCV 94.4 80.0 - 100.0 fL  ? MCH 30.0 26.0 - 34.0 pg  ? MCHC 31.8 30.0 - 36.0 g/dL  ? RDW 13.1 11.5 - 15.5 %  ? Platelets 306 150 - 400 K/uL  ? nRBC 0.0 0.0 - 0.2 %  ? Neutrophils Relative % 63 %  ? Neutro Abs 6.0 1.7 - 7.7 K/uL  ? Lymphocytes Relative 28 %  ? Lymphs Abs 2.7 0.7 - 4.0 K/uL  ? Monocytes Relative 9 %  ? Monocytes Absolute 0.9 0.1 - 1.0 K/uL  ? Eosinophils Relative 0 %  ? Eosinophils Absolute 0.0 0.0 - 0.5 K/uL  ? Basophils Relative 0 %  ? Basophils Absolute 0.0 0.0 - 0.1 K/uL  ? Immature Granulocytes 0 %  ? Abs Immature Granulocytes 0.04 0.00 - 0.07 K/uL  ?  Comment: Performed at [facility], [address] [place], [place] [address]  ? ? ?Blood Alcohol level:  ?Lab Results  ?Component Value Date  ? ETH <10 11/21/2021  ? ETH <10 10/23/2021  ? ? ?Metabolic Disorder Labs: ?Lab Results  ?Component Value Date  ? HGBA1C 4.8 11/23/2021  ? MPG 91.06 11/23/2021  ? MPG 97 02/09/2018  ? ?No results found for: PROLACTIN ?Lab Results  ?Component Value Date  ? CHOL 95 11/23/2021  ? TRIG 107 11/23/2021  ? HDL 29 (L) 11/23/2021  ? CHOLHDL 3.3 11/23/2021  ? VLDL 21 11/23/2021  ? LDLCALC 45 11/23/2021  ? LDLCALC 95 02/09/2018  ? ? ?Physical Findings: ?AIMS:  , ,  ,  ,    ?CIWA:    ?COWS:    ? ?Musculoskeletal: ?Strength & Muscle Tone: within normal limits ?Gait & Station: normal ?Patient leans: Right ? ?Psychiatric Specialty Exam: ? ?Presentation  ?General Appearance: Appropriate for Environment ? ?Eye Contact:Good ? ?Speech:Clear and Coherent ? ?Speech Volume:Normal ? ?Handedness:Right ? ? ?Mood and Affect  ?Mood:mad ?Affect:restricted ? ?Thought Process  ?Thought Processes:Coherent ? ?Descriptions of Associations:Intact ? ?Orientation:Full (Time, Place and Person) ? ?Thought Content:Logical; WDL ? ?History of Schizophrenia/Schizoaffective disorder:Yes ? ?Duration of Psychotic Symptoms:N/A ? ?Sensorium  ?Memory:Immediate Good; Remote Good; Recent  Good ? ?Judgment:poor ?Insight:poor ? ?Executive Functions  ?Concentration:Fair ? ?Attention Span:Fair ? ?Recall:Fair ? ?Fund of Knowledge:Fair ? ?Language:Fair ? ? ?Psychomotor Activity  ?Psychomotor Activity:No data recorded ? ?Assets  ?Assets:Co

## 2021-11-26 ENCOUNTER — Inpatient Hospital Stay: Payer: 59 | Admitting: Anesthesiology

## 2021-11-26 ENCOUNTER — Encounter: Payer: Self-pay | Admitting: Psychiatry

## 2021-11-26 ENCOUNTER — Other Ambulatory Visit: Payer: Self-pay | Admitting: Psychiatry

## 2021-11-26 DIAGNOSIS — F250 Schizoaffective disorder, bipolar type: Secondary | ICD-10-CM | POA: Diagnosis not present

## 2021-11-26 LAB — GLUCOSE, CAPILLARY: Glucose-Capillary: 104 mg/dL — ABNORMAL HIGH (ref 70–99)

## 2021-11-26 MED ORDER — LABETALOL HCL 5 MG/ML IV SOLN
INTRAVENOUS | Status: DC | PRN
Start: 2021-11-26 — End: 2021-11-26
  Administered 2021-11-26: 10 mg via INTRAVENOUS

## 2021-11-26 MED ORDER — SUCCINYLCHOLINE CHLORIDE 200 MG/10ML IV SOSY
PREFILLED_SYRINGE | INTRAVENOUS | Status: DC | PRN
  Administered 2021-11-26: 100 mg via INTRAVENOUS

## 2021-11-26 MED ORDER — ONDANSETRON HCL 4 MG/2ML IJ SOLN: 4.0000 mg | Freq: Once | INTRAMUSCULAR | Status: DC | PRN

## 2021-11-26 MED ORDER — MIDAZOLAM HCL 2 MG/2ML IJ SOLN
INTRAMUSCULAR | Status: DC | PRN
  Administered 2021-11-26: 2 mg via INTRAVENOUS

## 2021-11-26 MED ORDER — METHOHEXITAL SODIUM 100 MG/10ML IV SOSY
PREFILLED_SYRINGE | INTRAVENOUS | Status: DC | PRN
  Administered 2021-11-26: 70 mg via INTRAVENOUS

## 2021-11-26 MED ORDER — SODIUM CHLORIDE 0.9 % IV SOLN: INTRAVENOUS | Status: DC | PRN

## 2021-11-26 MED ORDER — MIDAZOLAM HCL 2 MG/2ML IJ SOLN
INTRAMUSCULAR | Status: AC
  Filled 2021-11-26: qty 2

## 2021-11-26 NOTE — Group Note (Signed)
[facility] LCSW Group Therapy Note ? ? ? ?Group Date: 11/26/2021 ?Start Time: 1300 ?End Time: 1400 ? ?Type of Therapy and Topic:  Group Therapy:  Overcoming Obstacles ? ?Participation Level:  [facility] PARTICIPATION LEVEL: Did Not Attend ? ?Mood: ? ?Description of Group:   ?In this group patients will be encouraged to explore what they see as obstacles to their own wellness and recovery. They will be guided to discuss their thoughts, feelings, and behaviors related to these obstacles. The group will process together ways to cope with barriers, with attention given to specific choices patients can make. Each patient will be challenged to identify changes they are motivated to make in order to overcome their obstacles. This group will be process-oriented, with patients participating in exploration of their own experiences as well as giving and receiving support and challenge from other group members. ? ?Therapeutic Goals: ?1. Patient will identify personal and current obstacles as they relate to admission. ?2. Patient will identify barriers that currently interfere with their wellness or overcoming obstacles.  ?3. Patient will identify feelings, thought process and behaviors related to these barriers. ?4. Patient will identify two changes they are willing to make to overcome these obstacles:  ? ? ?Summary of Patient Progress ? ? ?Patient off unit for ECT. Not able to participate in group therapy.  ? ? ?Therapeutic Modalities:   ?Cognitive Behavioral Therapy ?Solution Focused Therapy ?Motivational Interviewing ?Relapse Prevention Therapy ? ? ?[name], LCSWA ?

## 2021-11-26 NOTE — Anesthesia Preprocedure Evaluation (Signed)
Anesthesia Evaluation  ?Patient identified by MRN, date of birth, ID band ?Patient awake ? ? ? ?Reviewed: ?Allergy & Precautions, NPO status , Patient's Chart, lab work & pertinent test results, reviewed documented beta blocker date and time  ? ?History of Anesthesia Complications ?Negative for: history of anesthetic complications ? ?Airway ?Mallampati: II ? ?TM Distance: >3 FB ? ? ? ? Dental ? ?(+) Chipped ?  ?Pulmonary ?neg sleep apnea, neg COPD, Current Smoker and Patient abstained from smoking., former smoker,  ?  ?Pulmonary exam normal ? ? ? ? ? ? ? Cardiovascular ?Exercise Tolerance: Good ?hypertension, (-) CAD, (-) Past MI and (-) Cardiac Stents Normal cardiovascular exam ?- Systolic murmurs ? ?  ?Neuro/Psych ?PSYCHIATRIC DISORDERS Bipolar Disorder Schizophrenia negative neurological ROS ?   ? GI/Hepatic ?negative GI ROS, Neg liver ROS, neg GERD  ,  ?Endo/Other  ?diabetes, Oral Hypoglycemic Agents ? Renal/GU ?negative Renal ROS  ? ?  ?Musculoskeletal ?negative musculoskeletal ROS ?(+)  ? Abdominal ?(+) + obese,   ?Peds ? Hematology ?negative hematology ROS ?(+)   ?Anesthesia Other Findings ?Past Medical History: ?No date: Schizophrenia [facility]) ?No date: Tachycardia ? ? ? Reproductive/Obstetrics ? ?  ? ? ? ? ? ? ? ? ? ? ? ? ? ?  ?  ? ? ? ? ? ? ? ? ?Anesthesia Physical ? ?Anesthesia Plan ? ?ASA: 3 ? ?Anesthesia Plan: General  ? ?Post-op Pain Management:   ? ?Induction: Intravenous ? ?PONV Risk Score and Plan: 2 and Ondansetron ? ?Airway Management Planned: Mask ? ?Additional Equipment:  ? ?Intra-op Plan:  ? ?Post-operative Plan:  ? ?Informed Consent: I have reviewed the patients History and Physical, chart, labs and discussed the procedure including the risks, benefits and alternatives for the proposed anesthesia with the patient or authorized representative who has indicated his/her understanding and acceptance.  ? ? ? ?Dental Advisory Given ? ?Plan Discussed with: CRNA and  Anesthesiologist ? ?Anesthesia Plan Comments: (Patient consented for risks of anesthesia including but not limited to:  ?- adverse reactions to medications ?- risk of airway placement if required ?- damage to eyes, teeth, lips or other oral mucosa ?- nerve damage due to positioning  ?- sore throat or hoarseness ?- Damage to heart, brain, nerves, lungs, other parts of body or loss of life ? ?Patient voiced understanding. ? ?Also obtained official consent from legal guardian [name] from [employer] given that patient was involuntarily admitted. He agreed with all of the above.)  ? ? ? ? ? ? ?Anesthesia Quick Evaluation ? ?

## 2021-11-26 NOTE — Progress Notes (Signed)
[facility] MD Progress Note ? ?11/26/2021 10:59 AM ?Jonathan Alexander.  ?MRN:  [id number] ?Subjective: Follow-up for this 36 year old man with schizoaffective disorder.  Over the weekend he has continued with some of his hypersexual behavior but has not been violent or threatening.  Continues to be odd and disorganized in his thinking.  This morning has been basically cooperative but still showing some weird behaviors at times.  Has been compliant with medicine.  Plan is still in place for ECT today. ?Principal Problem: Schizoaffective disorder (HCC) ?Diagnosis: Principal Problem: ?  Schizoaffective disorder (HCC) ?Active Problems: ?  Hypertension ? ?Total Time spent with patient: 30 minutes ? ?Past Psychiatric History: Past history of schizoaffective disorder with lengthy hospitalizations but good response to maintenance ECT ? ?Past Medical History:  ?Past Medical History:  ?Diagnosis Date  ? Schizophrenia (HCC)   ? Tachycardia   ? History reviewed. No pertinent surgical history. ?Family History:  ?Family History  ?Family history unknown: Yes  ? ?Family Psychiatric  History: See previous ?Social History:  ?Social History  ? ?Substance and Sexual Activity  ?Alcohol Use No  ?   ?Social History  ? ?Substance and Sexual Activity  ?Drug Use No  ?  ?Social History  ? ?Socioeconomic History  ? Marital status: Single  ?  Spouse name: Not on file  ? Number of children: Not on file  ? Years of education: Not on file  ? Highest education level: Not on file  ?Occupational History  ? Not on file  ?Tobacco Use  ? Smoking status: Every Day  ?  Packs/day: 1.00  ?  Years: 4.00  ?  Pack years: 4.00  ?  Types: Cigarettes  ? Smokeless tobacco: Never  ?Substance and Sexual Activity  ? Alcohol use: No  ? Drug use: No  ? Sexual activity: Never  ?Other Topics Concern  ? Not on file  ?Social History Narrative  ? Not on file  ? ?Social Determinants of Health  ? ?Financial Resource Strain: Not on file  ?Food Insecurity: Not on file  ?Transportation  Needs: Not on file  ?Physical Activity: Not on file  ?Stress: Not on file  ?Social Connections: Not on file  ? ?Additional Social History:  ?  ?  ?  ?  ?  ?  ?  ?  ?  ?  ?  ? ?Sleep: Fair ? ?Appetite:  Fair ? ?Current Medications: ?Current Facility-Administered Medications  ?Medication Dose Route Frequency Provider Last Rate Last Admin  ? acetaminophen (TYLENOL) tablet 650 mg  650 mg Oral Q6H PRN Vanetta Mulders, NP      ? albuterol (VENTOLIN HFA) 108 (90 Base) MCG/ACT inhaler 2 puff  2 puff Inhalation Q6H PRN Jourdyn Ferrin, Jackquline Denmark, MD      ? alum & mag hydroxide-simeth (MAALOX/MYLANTA) 200-200-20 MG/5ML suspension 30 mL  30 mL Oral Q4H PRN Gabriel Cirri F, NP      ? atorvastatin (LIPITOR) tablet 10 mg  10 mg Oral QHS Brizeyda Holtmeyer, Jackquline Denmark, MD   10 mg at 11/25/21 2100  ? clonazePAM (KLONOPIN) tablet 0.5 mg  0.5 mg Oral TID Verlin Duke, Jackquline Denmark, MD   0.5 mg at 11/25/21 1623  ? cloZAPine (CLOZARIL) tablet 400 mg  400 mg Oral QHS Yazmeen Woolf, Jackquline Denmark, MD   400 mg at 11/25/21 2116  ? desmopressin (DDAVP) tablet 0.2 mg  0.2 mg Oral QHS Travus Oren T, MD   0.2 mg at 11/25/21 2100  ? imipramine (TOFRANIL) tablet 100 mg  100 mg Oral QHS Ciena Sampley T, MD   100 mg at 11/25/21 2100  ? ketotifen (ZADITOR) 0.025 % ophthalmic solution 2 drop  2 drop Left Eye Q4H PRN Reggie Pile, MD   2 drop at 11/25/21 1624  ? lithium carbonate (ESKALITH) CR tablet 450 mg  450 mg Oral Q12H Orene Abbasi T, MD   450 mg at 11/25/21 2100  ? loratadine (CLARITIN) tablet 10 mg  10 mg Oral Daily Azeez Dunker T, MD   10 mg at 11/25/21 7169  ? magnesium hydroxide (MILK OF MAGNESIA) suspension 30 mL  30 mL Oral Daily PRN Gabriel Cirri F, NP      ? metFORMIN (GLUCOPHAGE) tablet 500 mg  500 mg Oral BID WC Tonishia Steffy, Jackquline Denmark, MD   500 mg at 11/25/21 1623  ? metoprolol succinate (TOPROL-XL) 24 hr tablet 100 mg  100 mg Oral Daily Gabriel Cirri F, NP   100 mg at 11/26/21 0900  ? OLANZapine (ZYPREXA) tablet 20 mg  20 mg Oral QHS Addysin Porco T, MD   20 mg at 11/25/21  2116  ? venlafaxine XR (EFFEXOR-XR) 24 hr capsule 75 mg  75 mg Oral Q breakfast Athel Merriweather, Jackquline Denmark, MD   75 mg at 11/25/21 6789  ? ? ?Lab Results:  ?Results for orders placed or performed during the hospital encounter of 11/22/21 (from the past 48 hour(s))  ?CBC with Differential/Platelet     Status: None  ? Collection Time: 11/24/21 11:52 AM  ?Result Value Ref Range  ? WBC 9.6 4.0 - 10.5 K/uL  ? RBC 4.43 4.22 - 5.81 MIL/uL  ? Hemoglobin 13.3 13.0 - 17.0 g/dL  ? HCT 41.8 39.0 - 52.0 %  ? MCV 94.4 80.0 - 100.0 fL  ? MCH 30.0 26.0 - 34.0 pg  ? MCHC 31.8 30.0 - 36.0 g/dL  ? RDW 13.1 11.5 - 15.5 %  ? Platelets 306 150 - 400 K/uL  ? nRBC 0.0 0.0 - 0.2 %  ? Neutrophils Relative % 63 %  ? Neutro Abs 6.0 1.7 - 7.7 K/uL  ? Lymphocytes Relative 28 %  ? Lymphs Abs 2.7 0.7 - 4.0 K/uL  ? Monocytes Relative 9 %  ? Monocytes Absolute 0.9 0.1 - 1.0 K/uL  ? Eosinophils Relative 0 %  ? Eosinophils Absolute 0.0 0.0 - 0.5 K/uL  ? Basophils Relative 0 %  ? Basophils Absolute 0.0 0.0 - 0.1 K/uL  ? Immature Granulocytes 0 %  ? Abs Immature Granulocytes 0.04 0.00 - 0.07 K/uL  ?  Comment: Performed at [facility], [address], [place], [place] [address]  ?Glucose, capillary     Status: Abnormal  ? Collection Time: 11/26/21  6:44 AM  ?Result Value Ref Range  ? Glucose-Capillary 104 (H) 70 - 99 mg/dL  ?  Comment: Glucose reference range applies only to samples taken after fasting for at least 8 hours.  ? ? ?Blood Alcohol level:  ?Lab Results  ?Component Value Date  ? ETH <10 11/21/2021  ? ETH <10 10/23/2021  ? ? ?Metabolic Disorder Labs: ?Lab Results  ?Component Value Date  ? HGBA1C 4.8 11/23/2021  ? MPG 91.06 11/23/2021  ? MPG 97 02/09/2018  ? ?No results found for: PROLACTIN ?Lab Results  ?Component Value Date  ? CHOL 95 11/23/2021  ? TRIG 107 11/23/2021  ? HDL 29 (L) 11/23/2021  ? CHOLHDL 3.3 11/23/2021  ? VLDL 21 11/23/2021  ? LDLCALC 45 11/23/2021  ? LDLCALC 95 02/09/2018  ? ? ?  Physical Findings: ?AIMS:  , ,  ,  ,    ?CIWA:     ?COWS:    ? ?Musculoskeletal: ?Strength & Muscle Tone: within normal limits ?Gait & Station: normal ?Patient leans: N/A ? ?Psychiatric Specialty Exam: ? ?Presentation  ?General Appearance: Appropriate for Environment ? ?Eye Contact:Good ? ?Speech:Clear and Coherent ? ?Speech Volume:Normal ? ?Handedness:Right ? ? ?Mood and Affect  ?Mood:Euthymic ? ?Affect:Appropriate; Congruent ? ? ?Thought Process  ?Thought Processes:Coherent ? ?Descriptions of Associations:Intact ? ?Orientation:Full (Time, Place and Person) ? ?Thought Content:Logical; WDL ? ?History of Schizophrenia/Schizoaffective disorder:Yes ? ?Duration of Psychotic Symptoms:N/A ? ?Hallucinations:No data recorded ?Ideas of Reference:None ? ?Suicidal Thoughts:No data recorded ?Homicidal Thoughts:No data recorded ? ?Sensorium  ?Memory:Immediate Good; Remote Good; Recent Good ? ?Judgment:Fair ? ?Insight:Fair ? ? ?Executive Functions  ?Concentration:Fair ? ?Attention Span:Fair ? ?Recall:Fair ? ?Fund of Knowledge:Fair ? ?Language:Fair ? ? ?Psychomotor Activity  ?Psychomotor Activity:No data recorded ? ?Assets  ?Assets:Communication Skills; Desire for Improvement; Financial Resources/Insurance; Leisure Time; Social Support; Talents/Skills; Vocational/Educational ? ? ?Sleep  ?Sleep:No data recorded ? ? ?Physical Exam: ?Physical Exam ?Vitals and nursing note reviewed.  ?Constitutional:   ?   Appearance: Normal appearance.  ?HENT:  ?   Head: Normocephalic and atraumatic.  ?   Mouth/Throat:  ?   Pharynx: Oropharynx is clear.  ?Eyes:  ?   Pupils: Pupils are equal, round, and reactive to light.  ?Cardiovascular:  ?   Rate and Rhythm: Normal rate and regular rhythm.  ?Pulmonary:  ?   Effort: Pulmonary effort is normal.  ?   Breath sounds: Normal breath sounds.  ?Abdominal:  ?   General: Abdomen is flat.  ?   Palpations: Abdomen is soft.  ?Musculoskeletal:     ?   General: Normal range of motion.  ?Skin: ?   General: Skin is warm and dry.  ?Neurological:  ?   General: No  focal deficit present.  ?   Mental Status: He is alert. Mental status is at baseline.  ?Psychiatric:     ?   Attention and Perception: He is inattentive.     ?   Mood and Affect: Affect is blunt and in

## 2021-11-26 NOTE — H&P (Signed)
Jonathan Alexander. is an 36 y.o. male.   ?Chief Complaint: Patient overall irritable and disorganized today.  No physical complaints. ?HPI: History of schizoaffective disorder with partial response to ECT as part of his long-term treatment ? ?Past Medical History:  ?Diagnosis Date  ? Schizophrenia (HCC)   ? Tachycardia   ? ? ?History reviewed. No pertinent surgical history. ? ?Family History  ?Family history unknown: Yes  ? ?Social History:  reports that he has been smoking cigarettes. He has a 4.00 pack-year smoking history. He has never used smokeless tobacco. He reports that he does not drink alcohol and does not use drugs. ? ?Allergies:  ?Allergies  ?Allergen Reactions  ? Divalproex Sodium   ? Shellfish-Derived Products   ? ? ?Medications Prior to Admission  ?Medication Sig Dispense Refill  ? atorvastatin (LIPITOR) 10 MG tablet Take 10 mg by mouth at bedtime.    ? clozapine (CLOZARIL) 200 MG tablet Take 400 mg by mouth at bedtime.    ? desmopressin (DDAVP) 0.2 MG tablet Take 1 tablet (0.2 mg total) by mouth at bedtime. 30 tablet 1  ? imipramine (TOFRANIL) 50 MG tablet Take 2 tablets (100 mg total) by mouth at bedtime. 30 tablet 1  ? lithium carbonate (ESKALITH) 450 MG CR tablet Take 2 tablets (900 mg total) by mouth at bedtime. 60 tablet 1  ? loratadine (CLARITIN) 10 MG tablet Take 1 tablet (10 mg total) by mouth daily. 30 tablet 1  ? metFORMIN (GLUCOPHAGE) 500 MG tablet Take 1 tablet (500 mg total) by mouth 2 (two) times daily with a meal. 60 tablet 1  ? metoprolol succinate (TOPROL-XL) 100 MG 24 hr tablet Take 1 tablet (100 mg total) by mouth daily. 30 tablet 0  ? OLANZapine (ZYPREXA) 20 MG tablet Take 1 tablet (20 mg total) by mouth at bedtime. 30 tablet 1  ? venlafaxine XR (EFFEXOR-XR) 75 MG 24 hr capsule Take 1 capsule (75 mg total) by mouth daily with breakfast. 30 capsule 1  ? albuterol (VENTOLIN HFA) 108 (90 Base) MCG/ACT inhaler Inhale 2 puffs into the lungs every 6 (six) hours as needed for wheezing  or shortness of breath. 8 g 0  ? chlorhexidine (PERIDEX) 0.12 % solution Use as directed 15 mLs in the mouth or throat 2 (two) times daily.    ? clonazePAM (KLONOPIN) 1 MG tablet Take 1 tablet (1 mg total) by mouth 3 (three) times daily. (Patient not taking: Reported on 11/22/2021) 90 tablet 1  ? cloZAPine (CLOZARIL) 100 MG tablet TAKE 3 AND 1/2 TABLETS (350MG ) BY MOUTH AT BEDTIME (Patient not taking: Reported on 11/22/2021) 25 tablet 10  ? EPINEPHrine 0.3 mg/0.3 mL IJ SOAJ injection Inject 0.3 mg into the muscle as needed for anaphylaxis.    ? ferrous sulfate 325 (65 FE) MG tablet Take 1 tablet (325 mg total) by mouth daily with breakfast. (Patient not taking: Reported on 11/22/2021) 30 tablet 1  ? ? ?Results for orders placed or performed during the hospital encounter of 11/22/21 (from the past 48 hour(s))  ?Glucose, capillary     Status: Abnormal  ? Collection Time: 11/26/21  6:44 AM  ?Result Value Ref Range  ? Glucose-Capillary 104 (H) 70 - 99 mg/dL  ?  Comment: Glucose reference range applies only to samples taken after fasting for at least 8 hours.  ? ?No results found. ? ?Review of Systems  ?Constitutional: Negative.   ?HENT: Negative.    ?Eyes: Negative.   ?Respiratory: Negative.    ?Cardiovascular:  Negative.   ?Gastrointestinal: Negative.   ?Musculoskeletal: Negative.   ?Skin: Negative.   ?Neurological: Negative.   ?Psychiatric/Behavioral:  Positive for agitation and dysphoric mood. The patient is nervous/anxious.   ?All other systems reviewed and are negative. ? ?Blood pressure 115/72, pulse 100, temperature 97.9 ?F (36.6 ?C), resp. rate 16, height 5\' 9"  (1.753 m), weight 92.5 kg, SpO2 100 %. ?Physical Exam ?Vitals and nursing note reviewed.  ?Constitutional:   ?   Appearance: He is well-developed.  ?HENT:  ?   Head: Normocephalic and atraumatic.  ?Eyes:  ?   Conjunctiva/sclera: Conjunctivae normal.  ?   Pupils: Pupils are equal, round, and reactive to light.  ?Cardiovascular:  ?   Heart sounds: Normal heart  sounds.  ?Pulmonary:  ?   Effort: Pulmonary effort is normal.  ?Abdominal:  ?   Palpations: Abdomen is soft.  ?Musculoskeletal:     ?   General: Normal range of motion.  ?   Cervical back: Normal range of motion.  ?Skin: ?   General: Skin is warm and dry.  ?Neurological:  ?   General: No focal deficit present.  ?   Mental Status: He is alert.  ?Psychiatric:     ?   Attention and Perception: He is inattentive.     ?   Mood and Affect: Affect is blunt.     ?   Speech: Speech is delayed.     ?   Behavior: Behavior is slowed.     ?   Thought Content: Thought content is paranoid.     ?   Cognition and Memory: Cognition is impaired.     ?   Judgment: Judgment is impulsive.  ?  ? ?Assessment/Plan ?Admitted to the psychiatric unit.  Plan is to do ECT at least twice during this hospitalization while continuing medication in the hope that we can stabilize him again. ? ? , MD ?11/26/2021, 3:01 PM ? ? ? ?

## 2021-11-26 NOTE — Progress Notes (Signed)
Recreation Therapy Notes ? ? ?Date: 11/26/2021 ? ?Time: 10:45 am   ? ?Location: Courtyard     ? ?Behavioral response: N/A ?  ?Intervention Topic: Leisure    ? ?Discussion/Intervention: ?Patient refused to attend group.  ? ?Clinical Observations/Feedback:  ?Patient refused to attend group.  ?  ?[name] LRT/CTRS ? ? ? ? ? ? ? ?Jonathan Alexander ?11/26/2021 12:40 PM ?

## 2021-11-26 NOTE — Progress Notes (Signed)
Patient alert and oriented x 4 affect is flat but brightens upon approach, he denies SI/HI/AVH he is interacting appropriately with staff and peers and complaint with medicine. 15 minutes safety checks maintained will continue to monitor. ?

## 2021-11-26 NOTE — Procedures (Signed)
ECT SERVICES ?Physician?s Interval Evaluation & Treatment Note ? ?Patient Identification: Jonathan Alexander. ?MRN:  [id number] ?Date of Evaluation:  11/26/2021 ?TX #: [id number] ? ?MADRS:  ? ?MMSE:  ? ?P.E. Findings: ? ?No change to physical exam ? ?Psychiatric Interval Note: ? ?Disorganized irritable and paranoid ? ?Subjective: ? ?Patient is a 36 y.o. male seen for evaluation for Electroconvulsive Therapy. ?Feeling worse having more anger and irritability ? ?Treatment Summary: ? ? []   Right Unilateral             [x]  Bilateral ?  ?% Energy : 1.0 ms 100% ? ? Impedance: 1340 ohms ? ?Seizure Energy Index: 18,776 ?V squared ? ?Postictal Suppression Index: 81% ? ?Seizure Concordance Index: 98% ? ?Medications ? ?Pre Shock: Robinul 0.3 mg Toradol 30 mg Brevital 70 mg succinylcholine 100 mg ? ?Post Shock: Versed 2 mg ? ?Seizure Duration: 31 seconds EMG 49 seconds EEG ? ? ?Comments: ?Return for treatment on Wednesday ? ?Lungs: ? ?[x]   Clear to auscultation              ? ?[]  Other:  ? ?Heart: ?  ? [x]   Regular rhythm             []  irregular rhythm ? ? ? [x]   Previous H&P reviewed, patient examined and there are NO CHANGES              ?  ? []   Previous H&P reviewed, patient examined and there are changes noted. ? ? ? , MD ?4/24/20233:02 PM ? ?  ?

## 2021-11-26 NOTE — Transfer of Care (Signed)
Immediate Anesthesia Transfer of Care Note ? ?Patient: Jonathan Alexander. ? ?Procedure(s) Performed: ECT TX ? ?Patient Location: PACU ? ?Anesthesia Type:General ? ?Level of Consciousness: drowsy ? ?Airway & Oxygen Therapy: Patient Spontanous Breathing and Patient connected to face mask oxygen ? ?Post-op Assessment: Report given to RN and Post -op Vital signs reviewed and stable ? ?Post vital signs: Reviewed and stable ? ?Last Vitals:  ?Vitals Value Taken Time  ?BP 108/68 11/26/21 1212  ?Temp    ?Pulse 96 11/26/21 1214  ?Resp 18 11/26/21 1214  ?SpO2 100 % 11/26/21 1214  ?Vitals shown include unvalidated device data. ? ?Last Pain:  ?Vitals:  ? 11/26/21 1156  ?TempSrc: Tympanic  ?PainSc: 0-No pain  ?   ? ?  ? ?Complications: No notable events documented. ?

## 2021-11-26 NOTE — Anesthesia Procedure Notes (Signed)
Procedure Name: General with mask airway ?Date/Time: 11/26/2021 12:00 PM ?Performed by: Joanette Gula, Ronney Honeywell, CRNA ?Pre-anesthesia Checklist: Patient identified, Emergency Drugs available, Suction available, Patient being monitored and Timeout performed ?Patient Re-evaluated:Patient Re-evaluated prior to induction ?Oxygen Delivery Method: Circle system utilized ?Preoxygenation: Pre-oxygenation with 100% oxygen ?Induction Type: IV induction ?Ventilation: Mask ventilation throughout procedure ? ? ? ? ?

## 2021-11-26 NOTE — Plan of Care (Signed)
Patient calm and cooperative on approach. Patient tolerated ECT. No issues verbalized after ECT. Patient visible in the milieu and tried to engage in conversation with staff. No inaPatient asked staff " what's your opinion about angels? " Patient denies SI,HI and AVH. Appetite and energy level good. ADLs maintained. Support and encouragement given. ?

## 2021-11-27 DIAGNOSIS — F250 Schizoaffective disorder, bipolar type: Secondary | ICD-10-CM | POA: Diagnosis not present

## 2021-11-27 NOTE — Plan of Care (Signed)
D- Patient alert and oriented. Patient presented in a sullen, but pleasant mood on assessment stating that he slept good last night and had no complaints to voice to this Clinical research associate. Patient denied anxiety and endorsed "very little" depression, but he did not go into detail as to why he feels slightly depressed. Patient also denied SI, HI, AVH, and pain at this time. Overall, patient stated that he is doing good, reporting "I'm fine". Patient had no stated goals for today. ? ?A- Scheduled medications administered to patient, per MD orders. Support and encouragement provided.  Routine safety checks conducted every 15 minutes.  Patient informed to notify staff with problems or concerns. ? ?R- No adverse drug reactions noted. Patient contracts for safety at this time. Patient compliant with medications and treatment plan. Patient receptive, calm, and cooperative. Patient remains safe at this time. ? ?Problem: Education: ?Goal: Knowledge of [facility] Education information/materials will improve ?Outcome: Progressing ?Goal: Emotional status will improve ?Outcome: Progressing ?Goal: Mental status will improve ?Outcome: Progressing ?Goal: Verbalization of understanding the information provided will improve ?Outcome: Progressing ?  ?Problem: Safety: ?Goal: Periods of time without injury will increase ?Outcome: Progressing ?  ?Problem: Education: ?Goal: Will be free of psychotic symptoms ?Outcome: Progressing ?Goal: Knowledge of the prescribed therapeutic regimen will improve ?Outcome: Progressing ?  ?Problem: Safety: ?Goal: Ability to redirect hostility and anger into socially appropriate behaviors will improve ?Outcome: Progressing ?Goal: Ability to remain free from injury will improve ?Outcome: Progressing ?  ?

## 2021-11-27 NOTE — Progress Notes (Signed)
Patient has been active in the dayroom coloring and watching TV. Gets along with well with his peers.   Denies si hi avh. Is med compliant and received meds without issue.  Encouraged him to seek staff with any concerns. ? ? ? ? ?C Butler-Nicholson, LPN ?

## 2021-11-27 NOTE — Anesthesia Postprocedure Evaluation (Signed)
Anesthesia Post Note ? ?Patient: Stepfon Aja Bolander. ? ?Procedure(s) Performed: ECT TX ? ?Patient location during evaluation: PACU ?Anesthesia Type: General ?Level of consciousness: awake and alert ?Pain management: pain level controlled ?Vital Signs Assessment: post-procedure vital signs reviewed and stable ?Respiratory status: spontaneous breathing, nonlabored ventilation, respiratory function stable and patient connected to nasal cannula oxygen ?Cardiovascular status: blood pressure returned to baseline and stable ?Postop Assessment: no apparent nausea or vomiting ?Anesthetic complications: no ? ? ?No notable events documented. ? ? ?Last Vitals:  ?Vitals:  ? 11/27/21 0621 11/27/21 0827  ?BP: 97/76 109/60  ?Pulse: (!) 105 (!) 109  ?Resp: 18   ?Temp: 36.8 ?C   ?SpO2: 99%   ?  ?Last Pain:  ?Vitals:  ? 11/27/21 0900  ?TempSrc:   ?PainSc: 0-No pain  ? ? ?  ?  ?  ?  ?  ?  ? ?Corinda Gubler ? ? ? ? ?

## 2021-11-27 NOTE — Group Note (Signed)
[facility] LCSW Group Therapy Note ? ? ?Group Date: 11/27/2021 ?Start Time: 1400 ?End Time: 1500 ? ?Type of Therapy/Topic:  Group Therapy:  Feelings about Diagnosis ? ?Participation Level:  Did Not Attend  ? ?Description of Group:   ? This group will allow patients to explore their thoughts and feelings about diagnoses they have received. Patients will be guided to explore their level of understanding and acceptance of these diagnoses. Facilitator will encourage patients to process their thoughts and feelings about the reactions of others to their diagnosis, and will guide patients in identifying ways to discuss their diagnosis with significant others in their lives. This group will be process-oriented, with patients participating in exploration of their own experiences as well as giving and receiving support and challenge from other group members. ? ? ?Therapeutic Goals: ?1. Patient will demonstrate understanding of diagnosis as evidence by identifying two or more symptoms of the disorder:  ?2. Patient will be able to express two feelings regarding the diagnosis ?3. Patient will demonstrate ability to communicate their needs through discussion and/or role plays ? ?Summary of Patient Progress: ?Patient participated minimally in group. ? ?Therapeutic Modalities:   ?Cognitive Behavioral Therapy ?Brief Therapy ?Feelings Identification  ? ? ?Harden Mo, LCSW ?

## 2021-11-27 NOTE — Progress Notes (Signed)
Patient came up to nurses station asking if staff could make a long distance phone call for him. Patient has been quietly on the phone, without any issues thus far. Patient remains safe on the unit. ?

## 2021-11-27 NOTE — Progress Notes (Signed)
Patient has been isolative to room, except for meals and medication. Patient remains safe on the unit. ?

## 2021-11-27 NOTE — Progress Notes (Signed)
[facility] MD Progress Note ? ?11/27/2021 12:02 PM ?Jonathan Alexander.  ?MRN:  [id number] ?Subjective: Follow-up 36 year old man with schizoaffective disorder.  Patient was asleep at 11:00 when we went to see him but was arousable.  Still sleepy during the interview.  No new complaints.  Only wants to know when he can be discharged.  Has not shown any dangerous or inappropriate behavior that I am seeing logged in the last day.  It sounds like his flirtatiousness has calm down.  ECT yesterday tolerated without difficulty ?Principal Problem: Schizoaffective disorder (HCC) ?Diagnosis: Principal Problem: ?  Schizoaffective disorder (HCC) ?Active Problems: ?  Hypertension ? ?Total Time spent with patient: 30 minutes ? ?Past Psychiatric History: Past history of schizoaffective disorder ? ?Past Medical History:  ?Past Medical History:  ?Diagnosis Date  ? Schizophrenia (HCC)   ? Tachycardia   ? History reviewed. No pertinent surgical history. ?Family History:  ?Family History  ?Family history unknown: Yes  ? ?Family Psychiatric  History: None known ?Social History:  ?Social History  ? ?Substance and Sexual Activity  ?Alcohol Use No  ?   ?Social History  ? ?Substance and Sexual Activity  ?Drug Use No  ?  ?Social History  ? ?Socioeconomic History  ? Marital status: Single  ?  Spouse name: Not on file  ? Number of children: Not on file  ? Years of education: Not on file  ? Highest education level: Not on file  ?Occupational History  ? Not on file  ?Tobacco Use  ? Smoking status: Every Day  ?  Packs/day: 1.00  ?  Years: 4.00  ?  Pack years: 4.00  ?  Types: Cigarettes  ? Smokeless tobacco: Never  ?Substance and Sexual Activity  ? Alcohol use: No  ? Drug use: No  ? Sexual activity: Never  ?Other Topics Concern  ? Not on file  ?Social History Narrative  ? Not on file  ? ?Social Determinants of Health  ? ?Financial Resource Strain: Not on file  ?Food Insecurity: Not on file  ?Transportation Needs: Not on file  ?Physical Activity: Not on  file  ?Stress: Not on file  ?Social Connections: Not on file  ? ?Additional Social History:  ?  ?  ?  ?  ?  ?  ?  ?  ?  ?  ?  ? ?Sleep: Fair ? ?Appetite:  Fair ? ?Current Medications: ?Current Facility-Administered Medications  ?Medication Dose Route Frequency Provider Last Rate Last Admin  ? acetaminophen (TYLENOL) tablet 650 mg  650 mg Oral Q6H PRN Vanetta Mulders, NP      ? albuterol (VENTOLIN HFA) 108 (90 Base) MCG/ACT inhaler 2 puff  2 puff Inhalation Q6H PRN Ayeshia Coppin, Jackquline Denmark, MD      ? alum & mag hydroxide-simeth (MAALOX/MYLANTA) 200-200-20 MG/5ML suspension 30 mL  30 mL Oral Q4H PRN Gabriel Cirri F, NP      ? atorvastatin (LIPITOR) tablet 10 mg  10 mg Oral QHS Darin Redmann, Jackquline Denmark, MD   10 mg at 11/26/21 2121  ? clonazePAM (KLONOPIN) tablet 0.5 mg  0.5 mg Oral TID Jahmeir Geisen, Jackquline Denmark, MD   0.5 mg at 11/27/21 0827  ? cloZAPine (CLOZARIL) tablet 400 mg  400 mg Oral QHS Jasira Robinson, Jackquline Denmark, MD   400 mg at 11/26/21 2120  ? desmopressin (DDAVP) tablet 0.2 mg  0.2 mg Oral QHS Zeev Deakins T, MD   0.2 mg at 11/26/21 2122  ? imipramine (TOFRANIL) tablet 100 mg  100 mg Oral  QHS Janeya Deyo, Jackquline Denmark, MD   100 mg at 11/26/21 2122  ? ketotifen (ZADITOR) 0.025 % ophthalmic solution 2 drop  2 drop Left Eye Q4H PRN Reggie Pile, MD   2 drop at 11/25/21 1624  ? lithium carbonate (ESKALITH) CR tablet 450 mg  450 mg Oral Q12H Yarelli Decelles, Jackquline Denmark, MD   450 mg at 11/27/21 0827  ? loratadine (CLARITIN) tablet 10 mg  10 mg Oral Daily Danae Oland T, MD   10 mg at 11/27/21 0827  ? magnesium hydroxide (MILK OF MAGNESIA) suspension 30 mL  30 mL Oral Daily PRN Gabriel Cirri F, NP      ? metFORMIN (GLUCOPHAGE) tablet 500 mg  500 mg Oral BID WC Victoriya Pol, Jackquline Denmark, MD   500 mg at 11/27/21 0827  ? metoprolol succinate (TOPROL-XL) 24 hr tablet 100 mg  100 mg Oral Daily Gabriel Cirri F, NP   100 mg at 11/27/21 4166  ? OLANZapine (ZYPREXA) tablet 20 mg  20 mg Oral QHS Levii Hairfield, Jackquline Denmark, MD   20 mg at 11/26/21 2121  ? ondansetron (ZOFRAN) injection 4 mg  4  mg Intravenous Once PRN Corinda Gubler, MD      ? venlafaxine XR (EFFEXOR-XR) 24 hr capsule 75 mg  75 mg Oral Q breakfast Dakoda Bassette, Jackquline Denmark, MD   75 mg at 11/27/21 0827  ? ? ?Lab Results:  ?Results for orders placed or performed during the hospital encounter of 11/22/21 (from the past 48 hour(s))  ?Glucose, capillary     Status: Abnormal  ? Collection Time: 11/26/21  6:44 AM  ?Result Value Ref Range  ? Glucose-Capillary 104 (H) 70 - 99 mg/dL  ?  Comment: Glucose reference range applies only to samples taken after fasting for at least 8 hours.  ? ? ?Blood Alcohol level:  ?Lab Results  ?Component Value Date  ? ETH <10 11/21/2021  ? ETH <10 10/23/2021  ? ? ?Metabolic Disorder Labs: ?Lab Results  ?Component Value Date  ? HGBA1C 4.8 11/23/2021  ? MPG 91.06 11/23/2021  ? MPG 97 02/09/2018  ? ?No results found for: PROLACTIN ?Lab Results  ?Component Value Date  ? CHOL 95 11/23/2021  ? TRIG 107 11/23/2021  ? HDL 29 (L) 11/23/2021  ? CHOLHDL 3.3 11/23/2021  ? VLDL 21 11/23/2021  ? LDLCALC 45 11/23/2021  ? LDLCALC 95 02/09/2018  ? ? ?Physical Findings: ?AIMS:  , ,  ,  ,    ?CIWA:    ?COWS:    ? ?Musculoskeletal: ?Strength & Muscle Tone: within normal limits ?Gait & Station: normal ?Patient leans: N/A ? ?Psychiatric Specialty Exam: ? ?Presentation  ?General Appearance: Appropriate for Environment ? ?Eye Contact:Good ? ?Speech:Clear and Coherent ? ?Speech Volume:Normal ? ?Handedness:Right ? ? ?Mood and Affect  ?Mood:Euthymic ? ?Affect:Appropriate; Congruent ? ? ?Thought Process  ?Thought Processes:Coherent ? ?Descriptions of Associations:Intact ? ?Orientation:Full (Time, Place and Person) ? ?Thought Content:Logical; WDL ? ?History of Schizophrenia/Schizoaffective disorder:Yes ? ?Duration of Psychotic Symptoms:N/A ? ?Hallucinations:No data recorded ?Ideas of Reference:None ? ?Suicidal Thoughts:No data recorded ?Homicidal Thoughts:No data recorded ? ?Sensorium  ?Memory:Immediate Good; Remote Good; Recent  Good ? ?Judgment:Fair ? ?Insight:Fair ? ? ?Executive Functions  ?Concentration:Fair ? ?Attention Span:Fair ? ?Recall:Fair ? ?Fund of Knowledge:Fair ? ?Language:Fair ? ? ?Psychomotor Activity  ?Psychomotor Activity:No data recorded ? ?Assets  ?Assets:Communication Skills; Desire for Improvement; Financial Resources/Insurance; Leisure Time; Social Support; Talents/Skills; Vocational/Educational ? ? ?Sleep  ?Sleep:No data recorded ? ? ?Physical Exam: ?Physical Exam ?Vitals and nursing note reviewed.  ?  Constitutional:   ?   Appearance: Normal appearance.  ?HENT:  ?   Head: Normocephalic and atraumatic.  ?   Mouth/Throat:  ?   Pharynx: Oropharynx is clear.  ?Eyes:  ?   Pupils: Pupils are equal, round, and reactive to light.  ?Cardiovascular:  ?   Rate and Rhythm: Normal rate and regular rhythm.  ?Pulmonary:  ?   Effort: Pulmonary effort is normal.  ?   Breath sounds: Normal breath sounds.  ?Abdominal:  ?   General: Abdomen is flat.  ?   Palpations: Abdomen is soft.  ?Musculoskeletal:     ?   General: Normal range of motion.  ?Skin: ?   General: Skin is warm and dry.  ?Neurological:  ?   General: No focal deficit present.  ?   Mental Status: He is alert. Mental status is at baseline.  ?Psychiatric:     ?   Attention and Perception: He is inattentive.     ?   Mood and Affect: Mood normal. Affect is blunt.     ?   Speech: Speech is delayed.     ?   Behavior: Behavior is slowed.     ?   Thought Content: Thought content normal.     ?   Cognition and Memory: Cognition is impaired.  ? ?Review of Systems  ?Constitutional: Negative.   ?HENT: Negative.    ?Eyes: Negative.   ?Respiratory: Negative.    ?Cardiovascular: Negative.   ?Gastrointestinal: Negative.   ?Musculoskeletal: Negative.   ?Skin: Negative.   ?Neurological: Negative.   ?Psychiatric/Behavioral: Negative.    ?Blood pressure 109/60, pulse (!) 109, temperature 98.3 ?F (36.8 ?C), temperature source Oral, resp. rate 18, height  (1.753 m), weight 92.5 kg, SpO2 99 %.  Body mass index is 30.13 kg/m?. ? ? ?Treatment Plan Summary: ?Medication management and Plan continue current medication management.  He is on the schedule to have ECT again tomorrow at which point I am anticipating that we may be

## 2021-11-27 NOTE — Plan of Care (Signed)
?  Problem: Education: °Goal: Knowledge of [facility] Education information/materials will improve °Outcome: Progressing °Goal: Emotional status will improve °Outcome: Progressing °Goal: Mental status will improve °Outcome: Progressing °Goal: Verbalization of understanding the information provided will improve °Outcome: Progressing °  °Problem: Safety: °Goal: Periods of time without injury will increase °Outcome: Progressing °  °Problem: Education: °Goal: Will be free of psychotic symptoms °Outcome: Progressing °Goal: Knowledge of the prescribed therapeutic regimen will improve °Outcome: Progressing °  °

## 2021-11-27 NOTE — Progress Notes (Signed)
Patient asked this writer if I knew when he was going home. This Clinical research associate explained to patient that what the doctor stated earlier, it could possibly be Thursday or Friday. This Clinical research associate also stated to patient that it would be best for him to ask the doctor himself. Patient verbalized understanding. ?

## 2021-11-28 ENCOUNTER — Other Ambulatory Visit: Payer: Self-pay | Admitting: Psychiatry

## 2021-11-28 ENCOUNTER — Inpatient Hospital Stay: Payer: 59 | Admitting: Anesthesiology

## 2021-11-28 ENCOUNTER — Encounter: Payer: Self-pay | Admitting: Psychiatry

## 2021-11-28 ENCOUNTER — Ambulatory Visit: Payer: 59 | Attending: Psychiatry

## 2021-11-28 LAB — GLUCOSE, CAPILLARY: Glucose-Capillary: 98 mg/dL (ref 70–99)

## 2021-11-28 MED ORDER — SODIUM CHLORIDE 0.9 % IV SOLN: INTRAVENOUS | Status: DC | PRN

## 2021-11-28 MED ORDER — MIDAZOLAM HCL 2 MG/2ML IJ SOLN: 2.0000 mg | Freq: Once | INTRAMUSCULAR | Status: DC

## 2021-11-28 MED ORDER — KETOROLAC TROMETHAMINE 30 MG/ML IJ SOLN: 30.0000 mg | Freq: Once | INTRAMUSCULAR | Status: DC

## 2021-11-28 MED ORDER — GLYCOPYRROLATE 0.2 MG/ML IJ SOLN: 0.3000 mg | Freq: Once | INTRAMUSCULAR | Status: DC

## 2021-11-28 MED ORDER — SUCCINYLCHOLINE CHLORIDE 200 MG/10ML IV SOSY
PREFILLED_SYRINGE | INTRAVENOUS | Status: DC | PRN
Start: 2021-11-28 — End: 2021-11-28
  Administered 2021-11-28: 100 mg via INTRAVENOUS

## 2021-11-28 MED ORDER — METHOHEXITAL SODIUM 0.5 G IJ SOLR
INTRAMUSCULAR | Status: AC
  Filled 2021-11-28: qty 500

## 2021-11-28 MED ORDER — ONDANSETRON HCL 4 MG/2ML IJ SOLN: 4.0000 mg | Freq: Once | INTRAMUSCULAR | Status: DC | PRN

## 2021-11-28 MED ORDER — SODIUM CHLORIDE 0.9 % IV SOLN
500.0000 mL | Freq: Once | INTRAVENOUS | Status: AC
  Administered 2021-11-28: 500 mL via INTRAVENOUS

## 2021-11-28 MED ORDER — MIDAZOLAM HCL 2 MG/2ML IJ SOLN
INTRAMUSCULAR | Status: DC | PRN
  Administered 2021-11-28: 2 mg via INTRAVENOUS

## 2021-11-28 MED ORDER — METHOHEXITAL SODIUM 100 MG/10ML IV SOSY
PREFILLED_SYRINGE | INTRAVENOUS | Status: DC | PRN
  Administered 2021-11-28: 70 mg via INTRAVENOUS

## 2021-11-28 MED ORDER — MIDAZOLAM HCL 2 MG/2ML IJ SOLN
INTRAMUSCULAR | Status: AC
  Filled 2021-11-28: qty 2

## 2021-11-28 MED ORDER — LABETALOL HCL 5 MG/ML IV SOLN
INTRAVENOUS | Status: AC
  Filled 2021-11-28: qty 8

## 2021-11-28 NOTE — Anesthesia Preprocedure Evaluation (Signed)
Anesthesia Evaluation  ?Patient identified by MRN, date of birth, ID band ?Patient awake ? ?General Assessment Comment:Flat affect ? ?Reviewed: ?Allergy & Precautions, NPO status , Patient's Chart, lab work & pertinent test results, reviewed documented beta blocker date and time  ? ?History of Anesthesia Complications ?Negative for: history of anesthetic complications ? ?Airway ?Mallampati: II ? ?TM Distance: >3 FB ? ? ? ? Dental ? ?(+) Chipped ?  ?Pulmonary ?neg sleep apnea, neg COPD, Current Smoker and Patient abstained from smoking., former smoker,  ?  ?Pulmonary exam normal ? ? ? ? ? ? ? Cardiovascular ?Exercise Tolerance: Good ?hypertension, (-) CAD, (-) Past MI and (-) Cardiac Stents Normal cardiovascular exam ?- Systolic murmurs ? ?  ?Neuro/Psych ?PSYCHIATRIC DISORDERS Bipolar Disorder Schizophrenia negative neurological ROS ?   ? GI/Hepatic ?negative GI ROS, Neg liver ROS, neg GERD  ,  ?Endo/Other  ?diabetes, Oral Hypoglycemic Agents ? Renal/GU ?negative Renal ROS  ? ?  ?Musculoskeletal ?negative musculoskeletal ROS ?(+)  ? Abdominal ?(+) + obese,   ?Peds ? Hematology ?negative hematology ROS ?(+)   ?Anesthesia Other Findings ?Past Medical History: ?No date: Schizophrenia [facility]) ?No date: Tachycardia ? ? ? Reproductive/Obstetrics ? ?  ? ? ? ? ? ? ? ? ? ? ? ? ? ?  ?  ? ? ? ? ? ? ? ? ?Anesthesia Physical ? ?Anesthesia Plan ? ?ASA: 3 ? ?Anesthesia Plan: General  ? ?Post-op Pain Management:   ? ?Induction: Intravenous ? ?PONV Risk Score and Plan: 2 and Ondansetron ? ?Airway Management Planned: Mask ? ?Additional Equipment:  ? ?Intra-op Plan:  ? ?Post-operative Plan:  ? ?Informed Consent: I have reviewed the patients History and Physical, chart, labs and discussed the procedure including the risks, benefits and alternatives for the proposed anesthesia with the patient or authorized representative who has indicated his/her understanding and acceptance.  ? ? ? ?Dental Advisory  Given ? ?Plan Discussed with: CRNA and Anesthesiologist ? ?Anesthesia Plan Comments: (Patient consented for risks of anesthesia including but not limited to:  ?- adverse reactions to medications ?- risk of airway placement if required ?- damage to eyes, teeth, lips or other oral mucosa ?- nerve damage due to positioning  ?- sore throat or hoarseness ?- Damage to heart, brain, nerves, lungs, other parts of body or loss of life ? ?Patient voiced understanding. ? ?11/26/21 Serial official consent from legal guardian [name] from [employer] given that patient was involuntarily admitted. He agreed with all of the above.)  ? ? ? ? ? ? ?Anesthesia Quick Evaluation ? ?

## 2021-11-28 NOTE — Procedures (Signed)
[facility] ?Physician?s Interval Evaluation & Treatment Note ? ?Patient Identification: Jonathan Alexander. ?MRN:  [id number] ?Date of Evaluation:  11/28/2021 ?TX #: [id number] ? ?MADRS:  ? ?MMSE:  ? ?P.E. Findings: ? ?No change to physical exam ? ?Psychiatric Interval Note: ? ?He has been calmer and more appropriate with his behavior and not as intrusive since Monday. ? ?Subjective: ? ?Patient is a 36 y.o. male seen for evaluation for Electroconvulsive Therapy. ?Has no complaint except looking forward to discharge ? ?Treatment Summary: ? ? []   Right Unilateral             [x]  Bilateral ?  ?% Energy : 1.0 ms 100% ? ? Impedance: 1450 ohms ? ?Seizure Energy Index: 11,308 ?V squared ? ?Postictal Suppression Index: 86% ? ?Seizure Concordance Index: 96% ? ?Medications ? ?Pre Shock: Robinul 0.2 mg ketamine 80 mg Brevital 70 mg succinylcholine 100 mg ? ?Post Shock: Versed 2 mg ? ?Seizure Duration: 19 seconds EMG 38 seconds EEG ? ? ?Comments: ?Stabilized and I anticipate we will be discharging him back to his group home tomorrow and then see him back for regular treatment in 4 weeks ? ?Lungs: ? ?[x]   Clear to auscultation              ? ?[]  Other:  ? ?Heart: ?  ? [x]   Regular rhythm             []  irregular rhythm ? ? ? [x]   Previous H&P reviewed, patient examined and there are NO CHANGES              ?  ? []   Previous H&P reviewed, patient examined and there are changes noted. ? ? ? , MD ?4/26/20233:23 PM ? ?  ?

## 2021-11-28 NOTE — Group Note (Signed)
[facility] LCSW Group Therapy Note ? ? ?Group Date: 11/28/2021 ?Start Time: 1300 ?End Time: 1400 ? ? ?Type of Therapy/Topic:  Group Therapy:  Emotion Regulation ? ?Participation Level:  Did Not Attend  ? ?Mood: ? ?Description of Group:   ? The purpose of this group is to assist patients in learning to regulate negative emotions and experience positive emotions. Patients will be guided to discuss ways in which they have been vulnerable to their negative emotions. These vulnerabilities will be juxtaposed with experiences of positive emotions or situations, and patients challenged to use positive emotions to combat negative ones. Special emphasis will be placed on coping with negative emotions in conflict situations, and patients will process healthy conflict resolution skills. ? ?Therapeutic Goals: ?Patient will identify two positive emotions or experiences to reflect on in order to balance out negative emotions:  ?Patient will label two or more emotions that they find the most difficult to experience:  ?Patient will be able to demonstrate positive conflict resolution skills through discussion or role plays:  ? ?Summary of Patient Progress: ?Patient was off unit for ECT during time of group.  ? ? ? ?Therapeutic Modalities:   ?Cognitive Behavioral Therapy ?Feelings Identification ?Dialectical Behavioral Therapy ? ? ?[name], LCSWA ?

## 2021-11-28 NOTE — Progress Notes (Signed)
[facility] MD Progress Note ? ?11/28/2021 2:21 PM ?Jonathan Alexander.  ?MRN:  [id number] ?Subjective: Follow-up 36 year old man with schizoaffective disorder with a recent decompensation into a manic like condition.  He is greatly improved.  He is no longer hypersexual or intrusive with women on the unit.  He has no new complaints.  Not showing bizarre affect or behavior.  Patient had his second ECT treatment of this hospitalization today without any complication.  Tolerating medicine adequately.  No physical complaints ?Principal Problem: Schizoaffective disorder (HCC) ?Diagnosis: Principal Problem: ?  Schizoaffective disorder (HCC) ?Active Problems: ?  Hypertension ? ?Total Time spent with patient: 30 minutes ? ?Past Psychiatric History: Past history of longstanding chronic mental illness with a previous lengthy hospitalization ? ?Past Medical History:  ?Past Medical History:  ?Diagnosis Date  ? Schizophrenia (HCC)   ? Tachycardia   ? History reviewed. No pertinent surgical history. ?Family History:  ?Family History  ?Family history unknown: Yes  ? ?Family Psychiatric  History: See previous.  Really no information known. ?Social History:  ?Social History  ? ?Substance and Sexual Activity  ?Alcohol Use No  ?   ?Social History  ? ?Substance and Sexual Activity  ?Drug Use No  ?  ?Social History  ? ?Socioeconomic History  ? Marital status: Single  ?  Spouse name: Not on file  ? Number of children: Not on file  ? Years of education: Not on file  ? Highest education level: Not on file  ?Occupational History  ? Not on file  ?Tobacco Use  ? Smoking status: Every Day  ?  Packs/day: 1.00  ?  Years: 4.00  ?  Pack years: 4.00  ?  Types: Cigarettes  ? Smokeless tobacco: Never  ?Substance and Sexual Activity  ? Alcohol use: No  ? Drug use: No  ? Sexual activity: Never  ?Other Topics Concern  ? Not on file  ?Social History Narrative  ? Not on file  ? ?Social Determinants of Health  ? ?Financial Resource Strain: Not on file  ?Food  Insecurity: Not on file  ?Transportation Needs: Not on file  ?Physical Activity: Not on file  ?Stress: Not on file  ?Social Connections: Not on file  ? ?Additional Social History:  ?  ?  ?  ?  ?  ?  ?  ?  ?  ?  ?  ? ?Sleep: Fair ? ?Appetite:  Fair ? ?Current Medications: ?Current Facility-Administered Medications  ?Medication Dose Route Frequency Provider Last Rate Last Admin  ? acetaminophen (TYLENOL) tablet 650 mg  650 mg Oral Q6H PRN Vanetta Mulders, NP      ? albuterol (VENTOLIN HFA) 108 (90 Base) MCG/ACT inhaler 2 puff  2 puff Inhalation Q6H PRN Samari Bittinger, Jackquline Denmark, MD      ? alum & mag hydroxide-simeth (MAALOX/MYLANTA) 200-200-20 MG/5ML suspension 30 mL  30 mL Oral Q4H PRN Gabriel Cirri F, NP      ? atorvastatin (LIPITOR) tablet 10 mg  10 mg Oral QHS Amarea Macdowell, Jackquline Denmark, MD   10 mg at 11/27/21 2059  ? clonazePAM (KLONOPIN) tablet 0.5 mg  0.5 mg Oral TID Kitti Mcclish, Jackquline Denmark, MD   0.5 mg at 11/27/21 1645  ? cloZAPine (CLOZARIL) tablet 400 mg  400 mg Oral QHS Vernal Rutan, Jackquline Denmark, MD   400 mg at 11/27/21 2059  ? desmopressin (DDAVP) tablet 0.2 mg  0.2 mg Oral QHS Kimberlyn Quiocho T, MD   0.2 mg at 11/27/21 2100  ? glycopyrrolate (ROBINUL) injection  0.3 mg  0.3 mg Intravenous Once Puneet Selden T, MD      ? imipramine (TOFRANIL) tablet 100 mg  100 mg Oral QHS Terald Jump, Jackquline Denmark, MD   100 mg at 11/27/21 2059  ? ketorolac (TORADOL) 30 MG/ML injection 30 mg  30 mg Intravenous Once Lether Tesch T, MD      ? ketotifen (ZADITOR) 0.025 % ophthalmic solution 2 drop  2 drop Left Eye Q4H PRN Reggie Pile, MD   2 drop at 11/25/21 1624  ? lithium carbonate (ESKALITH) CR tablet 450 mg  450 mg Oral Q12H Tesla Bochicchio, Jackquline Denmark, MD   450 mg at 11/27/21 2059  ? loratadine (CLARITIN) tablet 10 mg  10 mg Oral Daily Jenaveve Fenstermaker T, MD   10 mg at 11/27/21 0827  ? magnesium hydroxide (MILK OF MAGNESIA) suspension 30 mL  30 mL Oral Daily PRN Gabriel Cirri F, NP      ? metFORMIN (GLUCOPHAGE) tablet 500 mg  500 mg Oral BID WC Staley Lunz, Jackquline Denmark, MD   500 mg at  11/27/21 1645  ? metoprolol succinate (TOPROL-XL) 24 hr tablet 100 mg  100 mg Oral Daily Gabriel Cirri F, NP   100 mg at 11/28/21 0900  ? midazolam (VERSED) injection 2 mg  2 mg Intravenous Once Versie Fleener T, MD      ? OLANZapine (ZYPREXA) tablet 20 mg  20 mg Oral QHS Aarushi Hemric, Jackquline Denmark, MD   20 mg at 11/27/21 2059  ? ondansetron (ZOFRAN) injection 4 mg  4 mg Intravenous Once PRN Corinda Gubler, MD      ? venlafaxine XR (EFFEXOR-XR) 24 hr capsule 75 mg  75 mg Oral Q breakfast Edyn Qazi, Jackquline Denmark, MD   75 mg at 11/27/21 0827  ? ? ?Lab Results:  ?Results for orders placed or performed during the hospital encounter of 11/22/21 (from the past 48 hour(s))  ?Glucose, capillary     Status: None  ? Collection Time: 11/28/21  6:35 AM  ?Result Value Ref Range  ? Glucose-Capillary 98 70 - 99 mg/dL  ?  Comment: Glucose reference range applies only to samples taken after fasting for at least 8 hours.  ? ? ?Blood Alcohol level:  ?Lab Results  ?Component Value Date  ? ETH <10 11/21/2021  ? ETH <10 10/23/2021  ? ? ?Metabolic Disorder Labs: ?Lab Results  ?Component Value Date  ? HGBA1C 4.8 11/23/2021  ? MPG 91.06 11/23/2021  ? MPG 97 02/09/2018  ? ?No results found for: PROLACTIN ?Lab Results  ?Component Value Date  ? CHOL 95 11/23/2021  ? TRIG 107 11/23/2021  ? HDL 29 (L) 11/23/2021  ? CHOLHDL 3.3 11/23/2021  ? VLDL 21 11/23/2021  ? LDLCALC 45 11/23/2021  ? LDLCALC 95 02/09/2018  ? ? ?Physical Findings: ?AIMS:  , ,  ,  ,    ?CIWA:    ?COWS:    ? ?Musculoskeletal: ?Strength & Muscle Tone: within normal limits ?Gait & Station: normal ?Patient leans: N/A ? ?Psychiatric Specialty Exam: ? ?Presentation  ?General Appearance: Appropriate for Environment ? ?Eye Contact:Good ? ?Speech:Clear and Coherent ? ?Speech Volume:Normal ? ?Handedness:Right ? ? ?Mood and Affect  ?Mood:Euthymic ? ?Affect:Appropriate; Congruent ? ? ?Thought Process  ?Thought Processes:Coherent ? ?Descriptions of Associations:Intact ? ?Orientation:Full (Time, Place and  Person) ? ?Thought Content:Logical; WDL ? ?History of Schizophrenia/Schizoaffective disorder:Yes ? ?Duration of Psychotic Symptoms:N/A ? ?Hallucinations:No data recorded ?Ideas of Reference:None ? ?Suicidal Thoughts:No data recorded ?Homicidal Thoughts:No data recorded ? ?Sensorium  ?Memory:Immediate Good; Remote Good;  Recent Good ? ?Judgment:Fair ? ?Insight:Fair ? ? ?Executive Functions  ?Concentration:Fair ? ?Attention Span:Fair ? ?Recall:Fair ? ?Fund of Knowledge:Fair ? ?Language:Fair ? ? ?Psychomotor Activity  ?Psychomotor Activity:No data recorded ? ?Assets  ?Assets:Communication Skills; Desire for Improvement; Financial Resources/Insurance; Leisure Time; Social Support; Talents/Skills; Vocational/Educational ? ? ?Sleep  ?Sleep:No data recorded ? ? ?Physical Exam: ?Physical Exam ?Vitals and nursing note reviewed.  ?Constitutional:   ?   Appearance: Normal appearance.  ?HENT:  ?   Head: Normocephalic and atraumatic.  ?   Mouth/Throat:  ?   Pharynx: Oropharynx is clear.  ?Eyes:  ?   Pupils: Pupils are equal, round, and reactive to light.  ?Cardiovascular:  ?   Rate and Rhythm: Normal rate and regular rhythm.  ?Pulmonary:  ?   Effort: Pulmonary effort is normal.  ?   Breath sounds: Normal breath sounds.  ?Abdominal:  ?   General: Abdomen is flat.  ?   Palpations: Abdomen is soft.  ?Musculoskeletal:     ?   General: Normal range of motion.  ?Skin: ?   General: Skin is warm and dry.  ?Neurological:  ?   General: No focal deficit present.  ?   Mental Status: He is alert. Mental status is at baseline.  ?Psychiatric:     ?   Attention and Perception: He is inattentive.     ?   Mood and Affect: Mood normal. Affect is blunt.     ?   Speech: Speech is delayed.     ?   Behavior: Behavior is slowed.     ?   Thought Content: Thought content normal.     ?   Cognition and Memory: Memory is impaired.  ? ?Review of Systems  ?Constitutional: Negative.   ?HENT: Negative.    ?Eyes: Negative.   ?Respiratory: Negative.     ?Cardiovascular: Negative.   ?Gastrointestinal: Negative.   ?Musculoskeletal: Negative.   ?Skin: Negative.   ?Neurological: Negative.   ?Psychiatric/Behavioral: Negative.    ?Blood pressure 118/77, pulse 95, temperature

## 2021-11-28 NOTE — H&P (Signed)
Jonathan Delvis Kau. is an 36 y.o. male.   ?Chief Complaint: better. calmer ?HPI: schizoaffective ? ?Past Medical History:  ?Diagnosis Date  ? Schizophrenia (HCC)   ? Tachycardia   ? ? ?History reviewed. No pertinent surgical history. ? ?Family History  ?Family history unknown: Yes  ? ?Social History:  reports that he has been smoking cigarettes. He has a 4.00 pack-year smoking history. He has never used smokeless tobacco. He reports that he does not drink alcohol and does not use drugs. ? ?Allergies:  ?Allergies  ?Allergen Reactions  ? Divalproex Sodium   ? Shellfish-Derived Products   ? ? ?Medications Prior to Admission  ?Medication Sig Dispense Refill  ? atorvastatin (LIPITOR) 10 MG tablet Take 10 mg by mouth at bedtime.    ? clozapine (CLOZARIL) 200 MG tablet Take 400 mg by mouth at bedtime.    ? desmopressin (DDAVP) 0.2 MG tablet Take 1 tablet (0.2 mg total) by mouth at bedtime. 30 tablet 1  ? imipramine (TOFRANIL) 50 MG tablet Take 2 tablets (100 mg total) by mouth at bedtime. 30 tablet 1  ? lithium carbonate (ESKALITH) 450 MG CR tablet Take 2 tablets (900 mg total) by mouth at bedtime. 60 tablet 1  ? loratadine (CLARITIN) 10 MG tablet Take 1 tablet (10 mg total) by mouth daily. 30 tablet 1  ? metFORMIN (GLUCOPHAGE) 500 MG tablet Take 1 tablet (500 mg total) by mouth 2 (two) times daily with a meal. 60 tablet 1  ? metoprolol succinate (TOPROL-XL) 100 MG 24 hr tablet Take 1 tablet (100 mg total) by mouth daily. 30 tablet 0  ? OLANZapine (ZYPREXA) 20 MG tablet Take 1 tablet (20 mg total) by mouth at bedtime. 30 tablet 1  ? venlafaxine XR (EFFEXOR-XR) 75 MG 24 hr capsule Take 1 capsule (75 mg total) by mouth daily with breakfast. 30 capsule 1  ? albuterol (VENTOLIN HFA) 108 (90 Base) MCG/ACT inhaler Inhale 2 puffs into the lungs every 6 (six) hours as needed for wheezing or shortness of breath. 8 g 0  ? chlorhexidine (PERIDEX) 0.12 % solution Use as directed 15 mLs in the mouth or throat 2 (two) times daily.    ?  clonazePAM (KLONOPIN) 1 MG tablet Take 1 tablet (1 mg total) by mouth 3 (three) times daily. (Patient not taking: Reported on 11/22/2021) 90 tablet 1  ? cloZAPine (CLOZARIL) 100 MG tablet TAKE 3 AND 1/2 TABLETS (350MG ) BY MOUTH AT BEDTIME (Patient not taking: Reported on 11/22/2021) 25 tablet 10  ? EPINEPHrine 0.3 mg/0.3 mL IJ SOAJ injection Inject 0.3 mg into the muscle as needed for anaphylaxis.    ? ferrous sulfate 325 (65 FE) MG tablet Take 1 tablet (325 mg total) by mouth daily with breakfast. (Patient not taking: Reported on 11/22/2021) 30 tablet 1  ? ? ?Results for orders placed or performed during the hospital encounter of 11/22/21 (from the past 48 hour(s))  ?Glucose, capillary     Status: None  ? Collection Time: 11/28/21  6:35 AM  ?Result Value Ref Range  ? Glucose-Capillary 98 70 - 99 mg/dL  ?  Comment: Glucose reference range applies only to samples taken after fasting for at least 8 hours.  ? ?No results found. ? ?Review of Systems  ?Constitutional: Negative.   ?HENT: Negative.    ?Eyes: Negative.   ?Respiratory: Negative.    ?Cardiovascular: Negative.   ?Gastrointestinal: Negative.   ?Musculoskeletal: Negative.   ?Skin: Negative.   ?Neurological: Negative.   ?Psychiatric/Behavioral: Negative.    ?  All other systems reviewed and are negative. ? ?Blood pressure (!) 123/100, pulse (!) 101, temperature 98.8 ?F (37.1 ?C), temperature source Oral, resp. rate 18, height 5\' 9"  (1.753 m), weight 92.5 kg, SpO2 100 %. ?Physical Exam ?Vitals and nursing note reviewed.  ?Constitutional:   ?   Appearance: He is well-developed.  ?HENT:  ?   Head: Normocephalic and atraumatic.  ?Eyes:  ?   Conjunctiva/sclera: Conjunctivae normal.  ?   Pupils: Pupils are equal, round, and reactive to light.  ?Cardiovascular:  ?   Heart sounds: Normal heart sounds.  ?Pulmonary:  ?   Effort: Pulmonary effort is normal.  ?Abdominal:  ?   Palpations: Abdomen is soft.  ?Musculoskeletal:     ?   General: Normal range of motion.  ?   Cervical  back: Normal range of motion.  ?Skin: ?   General: Skin is warm and dry.  ?Neurological:  ?   General: No focal deficit present.  ?   Mental Status: He is alert.  ?Psychiatric:     ?   Mood and Affect: Mood normal.     ?   Thought Content: Thought content normal.     ?   Judgment: Judgment normal.  ?  ? ?Assessment/Plan ?Likely DC today or tomorrow F/U 4 weeks ? ? , MD ?11/28/2021, 11:23 AM ? ? ? ?

## 2021-11-28 NOTE — Transfer of Care (Signed)
Immediate Anesthesia Transfer of Care Note ? ?Patient: Jonathan Alexander. ? ?Procedure(s) Performed: ECT TX ? ?Patient Location: PACU ? ?Anesthesia Type:MAC ? ?Level of Consciousness: drowsy ? ?Airway & Oxygen Therapy: Patient Spontanous Breathing and Patient connected to face mask oxygen ? ?Post-op Assessment: Report given to RN, Post -op Vital signs reviewed and stable and Patient moving all extremities ? ?Post vital signs: Reviewed and stable ? ?Last Vitals:  ?Vitals Value Taken Time  ?BP 129/82 11/28/21 1330  ?Temp 36.3 ?C 11/28/21 1330  ?Pulse 95 11/28/21 1335  ?Resp 17 11/28/21 1335  ?SpO2 100 % 11/28/21 1335  ? ? ?Last Pain:  ?Vitals:  ? 11/28/21 1330  ?TempSrc:   ?PainSc: Asleep  ?   ? ?  ? ?Complications: No notable events documented. ?

## 2021-11-28 NOTE — Plan of Care (Signed)
?  Problem: Education: ?Goal: Emotional status will improve ?Outcome: Progressing ?Goal: Mental status will improve ?Outcome: Progressing ?Goal: Verbalization of understanding the information provided will improve ?Outcome: Progressing ?  ?Problem: Safety: ?Goal: Periods of time without injury will increase ?Outcome: Progressing ?  ?Problem: Safety: ?Goal: Ability to redirect hostility and anger into socially appropriate behaviors will improve ?Outcome: Progressing ?Goal: Ability to remain free from injury will improve ?Outcome: Progressing ?  ?

## 2021-11-28 NOTE — Progress Notes (Signed)
Patient at the nurses station requesting medication for sleep.  He is pleasant and easy to engage in conversation.  He is med compliant and received his medication without incident.  He has been isolative to his room except for snack and his QHS meds.  He denies si  hi  avh and anxiety atthis encounter.  Continues to endorse some mild depression. Willl continue to monitor with q 15 mnute safety rounds. ? ? ? ?C Butler-Nicholson, LPN   ?

## 2021-11-28 NOTE — Plan of Care (Signed)
Patient tolerated ECT well. No issues verbalized. Patient talked to his mother. Patient states " she said I sound better." Denies SI,HI and AVH. Appropriate with staff & peers. Appetite and energy level good.Visible in the milieu. Looking forward for discharge tomorrow. Support and encouragement given. ?

## 2021-11-28 NOTE — BH IP Treatment Plan (Signed)
Interdisciplinary Treatment and Diagnostic Plan Update ? ?11/28/2021 ?Time of Session: 2536 ?Saketh Mike Gip. ?MRN: [id number] ? ?Principal Diagnosis: Schizoaffective disorder ([place]) ? ?Secondary Diagnoses: Principal Problem: ?  Schizoaffective disorder ([place]) ?Active Problems: ?  Hypertension ? ? ?Current Medications:  ?Current Facility-Administered Medications  ?Medication Dose Route Frequency Provider Last Rate Last Admin  ? acetaminophen (TYLENOL) tablet 650 mg  650 mg Oral Q6H PRN Sherlon Handing, NP      ? albuterol (VENTOLIN HFA) 108 (90 Base) MCG/ACT inhaler 2 puff  2 puff Inhalation Q6H PRN Clapacs, Madie Reno, MD      ? alum & mag hydroxide-simeth (MAALOX/MYLANTA) 200-200-20 MG/5ML suspension 30 mL  30 mL Oral Q4H PRN Waldon Merl F, NP      ? atorvastatin (LIPITOR) tablet 10 mg  10 mg Oral QHS Clapacs, Madie Reno, MD   10 mg at 11/27/21 2059  ? clonazePAM (KLONOPIN) tablet 0.5 mg  0.5 mg Oral TID Clapacs, Madie Reno, MD   0.5 mg at 11/27/21 1645  ? cloZAPine (CLOZARIL) tablet 400 mg  400 mg Oral QHS Clapacs, Madie Reno, MD   400 mg at 11/27/21 2059  ? desmopressin (DDAVP) tablet 0.2 mg  0.2 mg Oral QHS Clapacs, John T, MD   0.2 mg at 11/27/21 2100  ? imipramine (TOFRANIL) tablet 100 mg  100 mg Oral QHS Clapacs, Madie Reno, MD   100 mg at 11/27/21 2059  ? ketotifen (ZADITOR) 0.025 % ophthalmic solution 2 drop  2 drop Left Eye Q4H PRN Rulon Sera, MD   2 drop at 11/25/21 1624  ? lithium carbonate (ESKALITH) CR tablet 450 mg  450 mg Oral Q12H Clapacs, Madie Reno, MD   450 mg at 11/27/21 2059  ? loratadine (CLARITIN) tablet 10 mg  10 mg Oral Daily Clapacs, John T, MD   10 mg at 11/27/21 0827  ? magnesium hydroxide (MILK OF MAGNESIA) suspension 30 mL  30 mL Oral Daily PRN Waldon Merl F, NP      ? metFORMIN (GLUCOPHAGE) tablet 500 mg  500 mg Oral BID WC Clapacs, Madie Reno, MD   500 mg at 11/27/21 1645  ? metoprolol succinate (TOPROL-XL) 24 hr tablet 100 mg  100 mg Oral Daily Waldon Merl F, NP   100 mg at 11/27/21 5956   ? OLANZapine (ZYPREXA) tablet 20 mg  20 mg Oral QHS Clapacs, Madie Reno, MD   20 mg at 11/27/21 2059  ? ondansetron (ZOFRAN) injection 4 mg  4 mg Intravenous Once PRN Arita Miss, MD      ? venlafaxine XR (EFFEXOR-XR) 24 hr capsule 75 mg  75 mg Oral Q breakfast Clapacs, Madie Reno, MD   75 mg at 11/27/21 0827  ? ?PTA Medications: ?Medications Prior to Admission  ?Medication Sig Dispense Refill Last Dose  ? atorvastatin (LIPITOR) 10 MG tablet Take 10 mg by mouth at bedtime.     ? clozapine (CLOZARIL) 200 MG tablet Take 400 mg by mouth at bedtime.     ? desmopressin (DDAVP) 0.2 MG tablet Take 1 tablet (0.2 mg total) by mouth at bedtime. 30 tablet 1   ? imipramine (TOFRANIL) 50 MG tablet Take 2 tablets (100 mg total) by mouth at bedtime. 30 tablet 1   ? lithium carbonate (ESKALITH) 450 MG CR tablet Take 2 tablets (900 mg total) by mouth at bedtime. 60 tablet 1   ? loratadine (CLARITIN) 10 MG tablet Take 1 tablet (10 mg total) by mouth daily. 30 tablet 1   ?  metFORMIN (GLUCOPHAGE) 500 MG tablet Take 1 tablet (500 mg total) by mouth 2 (two) times daily with a meal. 60 tablet 1   ? metoprolol succinate (TOPROL-XL) 100 MG 24 hr tablet Take 1 tablet (100 mg total) by mouth daily. 30 tablet 0   ? OLANZapine (ZYPREXA) 20 MG tablet Take 1 tablet (20 mg total) by mouth at bedtime. 30 tablet 1   ? venlafaxine XR (EFFEXOR-XR) 75 MG 24 hr capsule Take 1 capsule (75 mg total) by mouth daily with breakfast. 30 capsule 1   ? albuterol (VENTOLIN HFA) 108 (90 Base) MCG/ACT inhaler Inhale 2 puffs into the lungs every 6 (six) hours as needed for wheezing or shortness of breath. 8 g 0   ? chlorhexidine (PERIDEX) 0.12 % solution Use as directed 15 mLs in the mouth or throat 2 (two) times daily.     ? clonazePAM (KLONOPIN) 1 MG tablet Take 1 tablet (1 mg total) by mouth 3 (three) times daily. (Patient not taking: Reported on 11/22/2021) 90 tablet 1   ? cloZAPine (CLOZARIL) 100 MG tablet TAKE 3 AND 1/2 TABLETS (350MG) BY MOUTH AT BEDTIME (Patient  not taking: Reported on 11/22/2021) 25 tablet 10   ? EPINEPHrine 0.3 mg/0.3 mL IJ SOAJ injection Inject 0.3 mg into the muscle as needed for anaphylaxis.     ? ferrous sulfate 325 (65 FE) MG tablet Take 1 tablet (325 mg total) by mouth daily with breakfast. (Patient not taking: Reported on 11/22/2021) 30 tablet 1   ? ? ?Patient Stressors: Educational concerns   ?Medication change or noncompliance   ? ?Patient Strengths: Motivation for treatment/growth  ?Religious Affiliation  ? ?Treatment Modalities: Medication Management, Group therapy, Case management,  ?1 to 1 session with clinician, Psychoeducation, Recreational therapy. ? ? ?Physician Treatment Plan for Primary Diagnosis: Schizoaffective disorder ([place]) ?Long Term Goal(s): Improvement in symptoms so as ready for discharge  ? ?Short Term Goals: Ability to maintain clinical measurements within normal limits will improve ?Compliance with prescribed medications will improve ?Ability to verbalize feelings will improve ?Ability to demonstrate self-control will improve ?Ability to identify and develop effective coping behaviors will improve ? ?Medication Management: Evaluate patient's response, side effects, and tolerance of medication regimen. ? ?Therapeutic Interventions: 1 to 1 sessions, Unit Group sessions and Medication administration. ? ?Evaluation of Outcomes: Not Met ? ?Physician Treatment Plan for Secondary Diagnosis: Principal Problem: ?  Schizoaffective disorder ([place]) ?Active Problems: ?  Hypertension ? ?Long Term Goal(s): Improvement in symptoms so as ready for discharge  ? ?Short Term Goals: Ability to maintain clinical measurements within normal limits will improve ?Compliance with prescribed medications will improve ?Ability to verbalize feelings will improve ?Ability to demonstrate self-control will improve ?Ability to identify and develop effective coping behaviors will improve    ? ?Medication Management: Evaluate patient's response, side effects, and  tolerance of medication regimen. ? ?Therapeutic Interventions: 1 to 1 sessions, Unit Group sessions and Medication administration. ? ?Evaluation of Outcomes: Not Met ? ? ?RN Treatment Plan for Primary Diagnosis: Schizoaffective disorder ([place]) ?Long Term Goal(s): Knowledge of disease and therapeutic regimen to maintain health will improve ? ?Short Term Goals: Ability to remain free from injury will improve, Ability to verbalize frustration and anger appropriately will improve, Ability to demonstrate self-control, Ability to participate in decision making will improve, Ability to verbalize feelings will improve, Ability to disclose and discuss suicidal ideas, Ability to identify and develop effective coping behaviors will improve, and Compliance with prescribed medications will improve ? ?Medication Management:  RN will administer medications as ordered by provider, will assess and evaluate patient's response and provide education to patient for prescribed medication. RN will report any adverse and/or side effects to prescribing provider. ? ?Therapeutic Interventions: 1 on 1 counseling sessions, Psychoeducation, Medication administration, Evaluate responses to treatment, Monitor vital signs and CBGs as ordered, Perform/monitor CIWA, COWS, AIMS and Fall Risk screenings as ordered, Perform wound care treatments as ordered. ? ?Evaluation of Outcomes: Not Met ? ? ?LCSW Treatment Plan for Primary Diagnosis: Schizoaffective disorder ([place]) ?Long Term Goal(s): Safe transition to appropriate next level of care at discharge, Engage patient in therapeutic group addressing interpersonal concerns. ? ?Short Term Goals: Engage patient in aftercare planning with referrals and resources, Increase social support, Increase ability to appropriately verbalize feelings, Increase emotional regulation, Facilitate acceptance of mental health diagnosis and concerns, Facilitate patient progression through stages of change regarding substance use  diagnoses and concerns, Identify triggers associated with mental health/substance abuse issues, and Increase skills for wellness and recovery ? ?Therapeutic Interventions: Assess for all discharge needs,

## 2021-11-28 NOTE — Progress Notes (Signed)
Recreation Therapy Notes ? ?Date: 11/28/2021 ?  ?Time: 11:00 am  ?  ?Location: Craft room   ?  ?Behavioral response: Appropriate ?  ?Intervention Topic: Time Management   ?  ?Discussion/Intervention:  ?Patient refused to attend group. ? ?Clinical Observations/Feedback: ?Patient refused to attend group.  ?Annali Lybrand LRT/CTRS ? ? ? ? ? ? ? ?Darius Fillingim ?11/28/2021 11:17 AM ?

## 2021-11-29 MED ORDER — DESMOPRESSIN ACETATE 0.2 MG PO TABS
0.2000 mg | ORAL_TABLET | Freq: Every day | ORAL | 0 refills | Status: DC
Start: 2021-11-29 — End: 2021-11-29

## 2021-11-29 MED ORDER — ALBUTEROL SULFATE HFA 108 (90 BASE) MCG/ACT IN AERS: 2.0000 | INHALATION_SPRAY | Freq: Four times a day (QID) | RESPIRATORY_TRACT | 0 refills | Status: DC | PRN

## 2021-11-29 MED ORDER — CLONAZEPAM 0.5 MG PO TABS: 0.5000 mg | ORAL_TABLET | Freq: Three times a day (TID) | ORAL | 0 refills | Status: DC

## 2021-11-29 MED ORDER — OLANZAPINE 20 MG PO TABS: 20.0000 mg | ORAL_TABLET | Freq: Every day | ORAL | 0 refills | Status: DC

## 2021-11-29 MED ORDER — VENLAFAXINE HCL ER 75 MG PO CP24: 75.0000 mg | ORAL_CAPSULE | Freq: Every day | ORAL | 0 refills | Status: DC

## 2021-11-29 MED ORDER — KETOTIFEN FUMARATE 0.025 % OP SOLN: 2.0000 [drp] | OPHTHALMIC | 0 refills | Status: DC | PRN

## 2021-11-29 MED ORDER — ATORVASTATIN CALCIUM 10 MG PO TABS: 10.0000 mg | ORAL_TABLET | Freq: Every day | ORAL | 0 refills | Status: DC

## 2021-11-29 MED ORDER — CLOZAPINE 200 MG PO TABS: 400.0000 mg | ORAL_TABLET | Freq: Every day | ORAL | 0 refills | Status: DC

## 2021-11-29 MED ORDER — LITHIUM CARBONATE ER 450 MG PO TBCR: 450.0000 mg | EXTENDED_RELEASE_TABLET | Freq: Two times a day (BID) | ORAL | 0 refills | Status: DC

## 2021-11-29 MED ORDER — IMIPRAMINE HCL 50 MG PO TABS
100.0000 mg | ORAL_TABLET | Freq: Every day | ORAL | 0 refills | Status: DC
Start: 2021-11-29 — End: 2021-11-29

## 2021-11-29 MED ORDER — KETOTIFEN FUMARATE 0.025 % OP SOLN: 2.0000 [drp] | OPHTHALMIC | 0 refills | Status: AC | PRN

## 2021-11-29 MED ORDER — METFORMIN HCL 500 MG PO TABS: 500.0000 mg | ORAL_TABLET | Freq: Two times a day (BID) | ORAL | 0 refills | Status: DC

## 2021-11-29 MED ORDER — IMIPRAMINE HCL 50 MG PO TABS: 100.0000 mg | ORAL_TABLET | Freq: Every day | ORAL | 0 refills | Status: DC

## 2021-11-29 MED ORDER — METOPROLOL SUCCINATE ER 100 MG PO TB24: 100.0000 mg | ORAL_TABLET | Freq: Every day | ORAL | 0 refills | Status: DC

## 2021-11-29 MED ORDER — LORATADINE 10 MG PO TABS: 10.0000 mg | ORAL_TABLET | Freq: Every day | ORAL | 0 refills | Status: DC

## 2021-11-29 MED ORDER — DESMOPRESSIN ACETATE 0.2 MG PO TABS: 0.2000 mg | ORAL_TABLET | Freq: Every day | ORAL | 0 refills | Status: DC

## 2021-11-29 MED ORDER — LITHIUM CARBONATE ER 450 MG PO TBCR: 450.0000 mg | EXTENDED_RELEASE_TABLET | Freq: Two times a day (BID) | ORAL | 0 refills | Status: AC

## 2021-11-29 MED ORDER — EPINEPHRINE 0.3 MG/0.3ML IJ SOAJ: 0.3000 mg | INTRAMUSCULAR | 0 refills | Status: DC | PRN

## 2021-11-29 MED ORDER — IMIPRAMINE HCL 50 MG PO TABS
100.0000 mg | ORAL_TABLET | Freq: Every day | ORAL | 0 refills | Status: AC
Start: 2021-11-29 — End: ?

## 2021-11-29 MED ORDER — CLOZAPINE 200 MG PO TABS: 400.0000 mg | ORAL_TABLET | Freq: Every day | ORAL | 0 refills | Status: AC

## 2021-11-29 MED ORDER — VENLAFAXINE HCL ER 75 MG PO CP24
75.0000 mg | ORAL_CAPSULE | Freq: Every day | ORAL | 0 refills | Status: AC
Start: 2021-11-29 — End: ?

## 2021-11-29 MED ORDER — FERROUS SULFATE 325 (65 FE) MG PO TABS: 325.0000 mg | ORAL_TABLET | Freq: Every day | ORAL | 0 refills | Status: DC

## 2021-11-29 MED ORDER — OLANZAPINE 20 MG PO TABS: 20.0000 mg | ORAL_TABLET | Freq: Every day | ORAL | 0 refills | Status: AC

## 2021-11-29 MED ORDER — METFORMIN HCL 500 MG PO TABS
500.0000 mg | ORAL_TABLET | Freq: Two times a day (BID) | ORAL | 0 refills | Status: DC
Start: 2021-11-29 — End: 2022-12-16

## 2021-11-29 NOTE — Progress Notes (Signed)
Recreation Therapy Notes ? ?INPATIENT RECREATION TR PLAN ? ?Patient Details ?Name: Charlene Sadao Weyer. ?MRN: [id number] ?DOB: 01/14/1986 ?Today's Date: 11/29/2021 ? ?Rec Therapy Plan ?Is patient appropriate for Therapeutic Recreation?: Yes ?Treatment times per week: at least 3 ?Estimated Length of Stay: 5-7 days ?TR Treatment/Interventions: Group participation (Comment) ? ?Discharge Criteria ?Pt will be discharged from therapy if:: Discharged ?Treatment plan/goals/alternatives discussed and agreed upon by:: Patient/family ? ?Discharge Summary ?Short term goals set: Patient will focus on task/topic with 2 prompts from staff within 5 recreation therapy group sessions ?Short term goals met: Adequate for discharge ?Progress toward goals comments: Groups attended ?Which groups?: Social skills ?Reason goals not met: N/A ?Therapeutic equipment acquired: N/A ?Reason patient discharged from therapy: Discharge from hospital ?Pt/family agrees with progress & goals achieved: Yes ?Date patient discharged from therapy: 11/29/21 ? ? ?Jonetta Dagley ?11/29/2021, 3:37 PM ?

## 2021-11-29 NOTE — Progress Notes (Signed)
?  [facility] Adult Case Management Discharge Plan : ? ?Will you be returning to the same living situation after discharge:  Yes,  pt is returning to his group home. ?At discharge, do you have transportation home?: Yes,  Group home will provide transportation. ?Do you have the ability to pay for your medications: Yes,  [facility] Medicare ? ?Release of information consent forms completed and in the chart;  Patient's signature needed at discharge. ? ?Patient to Follow up at: ? Follow-up Information   ? ? [facility] Follow up.   ?Why: ACTT team will see you tomorrow 11/30/2021 between 11AM and 3PM. They will follow up with you everyday for 7 days after that. ?Contact information: ?[address] ?[address] ?[place] [address] ?[phone] ? ? ?  ?  ? ?  ?  ? ?  ? ? ?Next level of care provider has access to [place] ? ?Safety Planning and Suicide Prevention discussed: Yes,  SPE completed with the patient's group home ? ?  ? ?Has patient been referred to the Quitline?: Patient refused referral ? ?Patient has been referred for addiction treatment: Pt. refused referral ? ?Rozann Lesches, LCSW ?11/29/2021, 9:54 AM ?

## 2021-11-29 NOTE — Progress Notes (Signed)
D- Patient alert and oriented. Patient presents in a pleasant mood on assessment stating that he slept "pretty good" last night and had no complaints to voice to this Clinical research associate. When asked about any signs/symptoms of depression/anxiety, patient stated "we all live with that". Patient did not go into any detail about why he stated this, however, he did state that overall, he is "feeling good".. Patient denies SI, HI, AVH, and pain at this time. Patient had no stated goals for today, he's just ready to go home. ? ?A- Scheduled medications administered to patient, per MD orders. Support and encouragement provided.  Routine safety checks conducted every 15 minutes.  Patient informed to notify staff with problems or concerns. ? ?R- No adverse drug reactions noted. Patient contracts for safety at this time. Patient compliant with medications and treatment plan. Patient receptive, calm, and cooperative. Patient interacts well with others on the unit.  Patient remains safe at this time. ? ?

## 2021-11-29 NOTE — BHH Counselor (Signed)
CSW spoke with pt's guardian, [name], [place] [facility] [place], [phone] to make aware of upcoming discharge.  He is aligned with the group home.  ? ?CSW reviewed the Medicare rights form with guardian and provided him the [facility] appeal line.  He reports no plans to appeal discharge. ? ?CSW asked for group home information.  CSW was provided verbal permission to contact Nilda Calamity, [phone].  CSW called and she reports that she will pick patient up around 2 or 3 PM. ? ?Guardian reports that patient follows up with [facility].  CSW was provided verbal permission to speak with [facility] to get follow up scheduled.  CSW left HIPAA compliant voicemail. ? ?Penni Homans, MSW, LCSW ?11/29/2021 9:13 AM  ?

## 2021-11-29 NOTE — Plan of Care (Signed)
?  Problem: Group Participation ?Goal: STG - Patient will focus on task/topic with 2 prompts from staff within 5 recreation therapy group sessions ?Description: STG - Patient will focus on task/topic with 2 prompts from staff within 5 recreation therapy group sessions ?11/29/2021 1536 by Alveria Apley, LRT ?Outcome: Adequate for Discharge ?11/29/2021 1536 by Alveria Apley, LRT ?Outcome: Adequate for Discharge ?  ?

## 2021-11-29 NOTE — BHH Suicide Risk Assessment (Signed)
[facility] Discharge Suicide Risk Assessment ? ? ?Principal Problem: Schizoaffective disorder (HCC) ?Discharge Diagnoses: Principal Problem: ?  Schizoaffective disorder (HCC) ?Active Problems: ?  Hypertension ? ? ?Total Time spent with patient: 30 minutes ? ?Musculoskeletal: ?Strength & Muscle Tone: within normal limits ?Gait & Station: normal ?Patient leans: N/A ? ?Psychiatric Specialty Exam ? ?Presentation  ?General Appearance: Appropriate for Environment ? ?Eye Contact:Good ? ?Speech:Clear and Coherent ? ?Speech Volume:Normal ? ?Handedness:Right ? ? ?Mood and Affect  ?Mood:Euthymic ? ?Duration of Depression Symptoms: Less than two weeks ? ?Affect:Appropriate; Congruent ? ? ?Thought Process  ?Thought Processes:Coherent ? ?Descriptions of Associations:Intact ? ?Orientation:Full (Time, Place and Person) ? ?Thought Content:Logical; WDL ? ?History of Schizophrenia/Schizoaffective disorder:Yes ? ?Duration of Psychotic Symptoms:N/A ? ?Hallucinations:No data recorded ?Ideas of Reference:None ? ?Suicidal Thoughts:No data recorded ?Homicidal Thoughts:No data recorded ? ?Sensorium  ?Memory:Immediate Good; Remote Good; Recent Good ? ?Judgment:Fair ? ?Insight:Fair ? ? ?Executive Functions  ?Concentration:Fair ? ?Attention Span:Fair ? ?Recall:Fair ? ?Fund of Knowledge:Fair ? ?Language:Fair ? ? ?Psychomotor Activity  ?Psychomotor Activity:No data recorded ? ?Assets  ?Assets:Communication Skills; Desire for Improvement; Financial Resources/Insurance; Leisure Time; Social Support; Talents/Skills; Vocational/Educational ? ? ?Sleep  ?Sleep:No data recorded ? ?Physical Exam: ?Physical Exam ?Vitals and nursing note reviewed.  ?Constitutional:   ?   Appearance: Normal appearance.  ?HENT:  ?   Head: Normocephalic and atraumatic.  ?   Mouth/Throat:  ?   Pharynx: Oropharynx is clear.  ?Eyes:  ?   Pupils: Pupils are equal, round, and reactive to light.  ?Cardiovascular:  ?   Rate and Rhythm: Normal rate and regular rhythm.  ?Pulmonary:  ?    Effort: Pulmonary effort is normal.  ?   Breath sounds: Normal breath sounds.  ?Abdominal:  ?   General: Abdomen is flat.  ?   Palpations: Abdomen is soft.  ?Musculoskeletal:     ?   General: Normal range of motion.  ?Skin: ?   General: Skin is warm and dry.  ?Neurological:  ?   General: No focal deficit present.  ?   Mental Status: He is alert. Mental status is at baseline.  ?Psychiatric:     ?   Attention and Perception: Attention normal.     ?   Mood and Affect: Mood normal. Affect is blunt.     ?   Speech: Speech is delayed.     ?   Behavior: Behavior is cooperative.     ?   Thought Content: Thought content normal.     ?   Cognition and Memory: Cognition is impaired.     ?   Judgment: Judgment normal.  ? ?Review of Systems  ?Constitutional: Negative.   ?HENT: Negative.    ?Eyes: Negative.   ?Respiratory: Negative.    ?Cardiovascular: Negative.   ?Gastrointestinal: Negative.   ?Musculoskeletal: Negative.   ?Skin: Negative.   ?Neurological: Negative.   ?Psychiatric/Behavioral: Negative.    ?Blood pressure 100/75, pulse 100, temperature 98.5 ?F (36.9 ?C), temperature source Oral, resp. rate 18, height 5\' 9"  (1.753 m), weight 92.5 kg, SpO2 97 %. Body mass index is 30.13 kg/m?. ? ?Mental Status Per Nursing Assessment::   ?On Admission:  NA ? ?Demographic Factors:  ?Male ? ?Loss Factors: ?NA ? ?Historical Factors: ?Impulsivity ? ?Risk Reduction Factors:   ?Religious beliefs about death, Positive social support, and Positive therapeutic relationship ? ?Continued Clinical Symptoms:  ?Schizophrenia:   Paranoid or undifferentiated type ? ?Cognitive Features That Contribute To Risk:  ?Polarized thinking   ? ?Suicide  Risk:  ?Minimal: No identifiable suicidal ideation.  Patients presenting with no risk factors but with morbid ruminations; may be classified as minimal risk based on the severity of the depressive symptoms ? ? Follow-up Information   ? ? [facility] Follow up.   ?Why: ACTT team will see you  tomorrow 11/30/2021 between 11AM and 3PM. They will follow up with you everyday for 7 days after that. ?Contact information: ?[address] ?[address] ?[place] [address] ?[phone] ? ? ?  ?  ? ?  ?  ? ?  ? ? ?Plan Of Care/Follow-up recommendations:  ?Patient appears to be returning towards his baseline.  Denies suicidal ideation.  No longer aggressive hypersexual or overly intrusive.  Cooperative with medicine.  Does not appear to be at elevated risk of self-harm.  He will be discharged back to his group home and regular outpatient follow-up ? ?[name], MD ?11/29/2021, 10:02 AM ?

## 2021-11-29 NOTE — Progress Notes (Signed)
Isolative to his room except for meals and snack.  He denies si hi avh.  He is med compliant and received his QHS meds without incident. Has  no residual affects from ECT treatment. Reports feeling better and ready for discharge. Will continue to monitor with q 15 minute safety rounds. ? ? ?C Butler-Nicholson, LPN ?

## 2021-11-29 NOTE — Care Management Important Message (Signed)
Important Message ? ?Patient Details  ?Name: Jonathan Alexander. ?MRN: [id number] ?Date of Birth: April 21, 1986 ? ? ?Medicare Important Message Given:  Yes ? ? ? ? ?Rozann Lesches, LCSW ?11/29/2021, 10:14 AM ?

## 2021-11-29 NOTE — Discharge Summary (Signed)
Physician Discharge Summary Note ? ?Patient:  Jonathan Horton MarshallSantana Jr. is an 36 y.o., male ?MRN:  [id number] ?DOB:  12/10/1985 ?Patient phone:  [phone] (home)  ?Patient [place]   [facility] ?[place] [id number],  ?Total Time spent with patient: 30 minutes ? ?Date of Admission:  11/22/2021 ?Date of Discharge: 11/29/2021 ? ?Reason for Admission: Patient was admitted through the emergency room where he was brought by his group home because of increasing agitation with hypersexual behaviors and behaviors thought to possibly indicate dangerousness. ? ?Principal Problem: Schizoaffective disorder (HCC) ?Discharge Diagnoses: Principal Problem: ?  Schizoaffective disorder (HCC) ?Active Problems: ?  Hypertension ? ? ?Past Psychiatric History: Long history of schizoaffective disorder.  Past history of multiple hospitalizations.  For several years has been stable on combination of medication and maintenance ECT ? ?Past Medical History:  ?Past Medical History:  ?Diagnosis Date  ? Schizophrenia (HCC)   ? Tachycardia   ? History reviewed. No pertinent surgical history. ?Family History:  ?Family History  ?Family history unknown: Yes  ? ?Family Psychiatric  History: None reported ?Social History:  ?Social History  ? ?Substance and Sexual Activity  ?Alcohol Use No  ?   ?Social History  ? ?Substance and Sexual Activity  ?Drug Use No  ?  ?Social History  ? ?Socioeconomic History  ? Marital status: Single  ?  Spouse name: Not on file  ? Number of children: Not on file  ? Years of education: Not on file  ? Highest education level: Not on file  ?Occupational History  ? Not on file  ?Tobacco Use  ? Smoking status: Every Day  ?  Packs/day: 1.00  ?  Years: 4.00  ?  Pack years: 4.00  ?  Types: Cigarettes  ? Smokeless tobacco: Never  ?Substance and Sexual Activity  ? Alcohol use: No  ? Drug use: No  ? Sexual activity: Never  ?Other Topics Concern  ? Not on file  ?Social History Narrative  ? Not on file  ? ?Social  Determinants of Health  ? ?Financial Resource Strain: Not on file  ?Food Insecurity: Not on file  ?Transportation Needs: Not on file  ?Physical Activity: Not on file  ?Stress: Not on file  ?Social Connections: Not on file  ? ? ?Hospital Course: Patient admitted to psychiatric ward.  15-minute checks continued.  Did not display dangerous behavior but did display some inappropriate hypersexual comments disorganized thinking labile and inappropriate affect.  Patient was continued on outpatient medication and also scheduled to start ECT.  He had 2 ECT treatments Monday and Wednesday of this week.  His guardian had been contacted and gave consent for those.  Tolerated them well without any complication.  Patient has shown predictable improvement in his symptoms.  He is now calm and compliant and not aggressive.  He is agreeable to going back to his group home.  Plan will be for outpatient treatment to continue with his ACT team for medication and management and ECT we will see him again in 4 weeks.  I promised him that if he was doing well at that point we could again try stretching it out to 6 weeks. ? ?Physical Findings: ?AIMS:  , ,  ,  ,    ?CIWA:    ?COWS:    ? ?Musculoskeletal: ?Strength & Muscle Tone: within normal limits ?Gait & Station: normal ?Patient leans: N/A ? ? ?Psychiatric Specialty Exam: ? ?Presentation  ?General Appearance: Appropriate for Environment ? ?Eye Contact:Good ? ?  Speech:Clear and Coherent ? ?Speech Volume:Normal ? ?Handedness:Right ? ? ?Mood and Affect  ?Mood:Euthymic ? ?Affect:Appropriate; Congruent ? ? ?Thought Process  ?Thought Processes:Coherent ? ?Descriptions of Associations:Intact ? ?Orientation:Full (Time, Place and Person) ? ?Thought Content:Logical; WDL ? ?History of Schizophrenia/Schizoaffective disorder:Yes ? ?Duration of Psychotic Symptoms:N/A ? ?Hallucinations:No data recorded ?Ideas of Reference:None ? ?Suicidal Thoughts:No data recorded ?Homicidal Thoughts:No data  recorded ? ?Sensorium  ?Memory:Immediate Good; Remote Good; Recent Good ? ?Judgment:Fair ? ?Insight:Fair ? ? ?Executive Functions  ?Concentration:Fair ? ?Attention Span:Fair ? ?Recall:Fair ? ?Fund of Knowledge:Fair ? ?Language:Fair ? ? ?Psychomotor Activity  ?Psychomotor Activity:No data recorded ? ?Assets  ?Assets:Communication Skills; Desire for Improvement; Financial Resources/Insurance; Leisure Time; Social Support; Talents/Skills; Vocational/Educational ? ? ?Sleep  ?Sleep:No data recorded ? ? ?Physical Exam: ?Physical Exam ?Vitals and nursing note reviewed.  ?Constitutional:   ?   Appearance: Normal appearance.  ?HENT:  ?   Head: Normocephalic and atraumatic.  ?   Mouth/Throat:  ?   Pharynx: Oropharynx is clear.  ?Eyes:  ?   Pupils: Pupils are equal, round, and reactive to light.  ?Cardiovascular:  ?   Rate and Rhythm: Normal rate and regular rhythm.  ?Pulmonary:  ?   Effort: Pulmonary effort is normal.  ?   Breath sounds: Normal breath sounds.  ?Abdominal:  ?   General: Abdomen is flat.  ?   Palpations: Abdomen is soft.  ?Musculoskeletal:     ?   General: Normal range of motion.  ?Skin: ?   General: Skin is warm and dry.  ?Neurological:  ?   General: No focal deficit present.  ?   Mental Status: He is alert. Mental status is at baseline.  ?Psychiatric:     ?   Attention and Perception: He is inattentive.     ?   Mood and Affect: Mood normal. Affect is blunt.     ?   Speech: Speech is delayed.     ?   Behavior: Behavior is slowed.     ?   Thought Content: Thought content normal.     ?   Cognition and Memory: Cognition is impaired.     ?   Judgment: Judgment is impulsive.  ? ?Review of Systems  ?Constitutional: Negative.   ?HENT: Negative.    ?Eyes: Negative.   ?Respiratory: Negative.    ?Cardiovascular: Negative.   ?Gastrointestinal: Negative.   ?Musculoskeletal: Negative.   ?Skin: Negative.   ?Neurological: Negative.   ?Psychiatric/Behavioral: Negative.    ?Blood pressure 100/75, pulse 100, temperature 98.5 ?F  (36.9 ?C), temperature source Oral, resp. rate 18, height 5\' 9"  (1.753 m), weight 92.5 kg, SpO2 97 %. Body mass index is 30.13 kg/m?. ? ? ?Social History  ? ?Tobacco Use  ?Smoking Status Every Day  ? Packs/day: 1.00  ? Years: 4.00  ? Pack years: 4.00  ? Types: Cigarettes  ?Smokeless Tobacco Never  ? ?Tobacco Cessation:  A prescription for an FDA-approved tobacco cessation medication was offered at discharge and the patient refused ? ? ?Blood Alcohol level:  ?Lab Results  ?Component Value Date  ? ETH <10 11/21/2021  ? ETH <10 10/23/2021  ? ? ?Metabolic Disorder Labs:  ?Lab Results  ?Component Value Date  ? HGBA1C 4.8 11/23/2021  ? MPG 91.06 11/23/2021  ? MPG 97 02/09/2018  ? ?No results found for: PROLACTIN ?Lab Results  ?Component Value Date  ? CHOL 95 11/23/2021  ? TRIG 107 11/23/2021  ? HDL 29 (L) 11/23/2021  ? CHOLHDL 3.3 11/23/2021  ?  VLDL 21 11/23/2021  ? LDLCALC 45 11/23/2021  ? LDLCALC 95 02/09/2018  ? ? ?See Psychiatric Specialty Exam and Suicide Risk Assessment completed by Attending Physician prior to discharge. ? ?Discharge destination:  Home ? ?Is patient on multiple antipsychotic therapies at discharge:  Yes,   ?Do you recommend tapering to monotherapy for antipsychotics?  No   ?Has Patient had three or more failed trials of antipsychotic monotherapy by history:  Yes,   Antipsychotic medications that previously failed include:   1.  Haloperidol., 2.  Risperdal., and 3.  Zyprexa. ? ?Recommended Plan for Multiple Antipsychotic Therapies: ?Second antipsychotic is Clozapine.  Reason for adding Clozapine insufficient response to single medication ? ?Discharge Instructions   ? ? Diet - low sodium heart healthy   Complete by: As directed ?  ? Increase activity slowly   Complete by: As directed ?  ? ?  ? ?Allergies as of 11/29/2021   ? ?   Reactions  ? Divalproex Sodium   ? Shellfish-derived Products   ? ?  ? ?  ?Medication List  ?  ? ?STOP taking these medications   ? ?chlorhexidine 0.12 % solution ?Commonly  known as: PERIDEX ?  ?EPINEPHrine 0.3 mg/0.3 mL Soaj injection ?Commonly known as: EPI-PEN ?  ? ?  ? ?TAKE these medications   ? ?  Indication  ?albuterol 108 (90 Base) MCG/ACT inhaler ?Commonly known as: VENTOLIN HFA

## 2021-11-29 NOTE — Anesthesia Postprocedure Evaluation (Signed)
Anesthesia Post Note ? ?Patient: Jonathan Alexander. ? ?Procedure(s) Performed: ECT TX ? ?Patient location during evaluation: PACU ?Anesthesia Type: MAC ?Level of consciousness: awake and alert ?Pain management: pain level controlled ?Vital Signs Assessment: post-procedure vital signs reviewed and stable ?Respiratory status: spontaneous breathing, nonlabored ventilation, respiratory function stable and patient connected to nasal cannula oxygen ?Cardiovascular status: blood pressure returned to baseline and stable ?Postop Assessment: no apparent nausea or vomiting ?Anesthetic complications: no ? ? ?No notable events documented. ? ? ?Last Vitals:  ?Vitals:  ? 11/29/21 9201 11/29/21 0824  ?BP: 97/67 100/75  ?Pulse: 96 100  ?Resp: 18   ?Temp: 36.9 ?C   ?SpO2: 97%   ?  ?Last Pain:  ?Vitals:  ? 11/29/21 0824  ?TempSrc:   ?PainSc: 0-No pain  ? ? ?  ?  ?  ?  ?  ?  ? ?Arita Miss ? ? ? ? ?

## 2021-11-29 NOTE — Plan of Care (Signed)
?  Problem: Education: Goal: Knowledge of [facility] Education information/materials will improve Outcome: Progressing Goal: Emotional status will improve Outcome: Progressing Goal: Mental status will improve Outcome: Progressing Goal: Verbalization of understanding the information provided will improve Outcome: Progressing   Problem: Safety: Goal: Periods of time without injury will increase Outcome: Progressing   Problem: Safety: Goal: Ability to redirect hostility and anger into socially appropriate behaviors will improve Outcome: Progressing Goal: Ability to remain free from injury will improve Outcome: Progressing   

## 2021-11-29 NOTE — [place] FL2 (Signed)
?[place] MEDICAID FL2 LEVEL OF CARE SCREENING TOOL  ?  ? ?IDENTIFICATION  ?Patient Name: ?Jonathan Alexander. Birthdate: 02-12-1986 Sex: male Admission Date (Current Location): ?11/22/2021  ?[place] Number: ? [place] ?  Facility and Address:  ?[facility], [address], [place], [place] [address] ?     Provider Number: ?[id number]  ?Attending Physician Name and Address:  ?Clapacs, Jackquline Denmark, MD ? Relative Name and Phone Number:  ?  ?   ?Current Level of Care: ?Hospital Recommended Level of Care: ?Family Care Home, Other (Comment) (Group Home) Prior Approval Number: ?  ? ?Date Approved/Denied: ?  PASRR Number: ?  ? ?Discharge Plan: ?Other (Comment) (Family Care Home, Group Home) ?  ? ?Current Diagnoses: ?Patient Active Problem List  ? Diagnosis Date Noted  ? Schizoaffective disorder (HCC) 11/22/2021  ? Schizophrenia (HCC) 01/13/2019  ? Schizoaffective disorder, bipolar type (HCC) 02/09/2018  ? Hypertension 02/09/2018  ? ? ?Orientation RESPIRATION BLADDER Height & Weight   ?  ?Self, Time, Situation, Place ? Normal Continent Weight: 204 lb (92.5 kg) ?Height:  5\' 9"  (175.3 cm)  ?BEHAVIORAL SYMPTOMS/MOOD NEUROLOGICAL BOWEL NUTRITION STATUS  ?   (NA) Continent    ?AMBULATORY STATUS COMMUNICATION OF NEEDS Skin   ?Independent Verbally Normal ?  ?  ?  ?    ?     ?     ? ? ?Personal Care Assistance Level of Assistance  ? (NA)   ?  ?  ?   ? ?Functional Limitations Info  ?Sight Sight Info: Impaired ?  ?   ? ? ?SPECIAL CARE FACTORS FREQUENCY  ?    ?  ?  ?  ?  ?  ?  ?   ? ? ?Contractures Contractures Info: Not present  ? ? ?Additional Factors Info  ?Code Status, Allergies Code Status Info: Full ?Allergies Info: Divalproex Sodium, Shellfish-derived prodducts ?  ?  ?  ?   ? ?Current Medications (11/29/2021):  This is the current hospital active medication list ?Current Facility-Administered Medications  ?Medication Dose Route Frequency Provider Last Rate Last Admin  ? acetaminophen (TYLENOL)  tablet 650 mg  650 mg Oral Q6H PRN 12/01/2021, NP      ? albuterol (VENTOLIN HFA) 108 (90 Base) MCG/ACT inhaler 2 puff  2 puff Inhalation Q6H PRN Clapacs, Vanetta Mulders, MD      ? alum & mag hydroxide-simeth (MAALOX/MYLANTA) 200-200-20 MG/5ML suspension 30 mL  30 mL Oral Q4H PRN 02-11-2001 F, NP      ? atorvastatin (LIPITOR) tablet 10 mg  10 mg Oral QHS Clapacs, Gabriel Cirri, MD   10 mg at 11/28/21 2125  ? clonazePAM (KLONOPIN) tablet 0.5 mg  0.5 mg Oral TID Clapacs, 2126, MD   0.5 mg at 11/29/21 0825  ? cloZAPine (CLOZARIL) tablet 400 mg  400 mg Oral QHS Clapacs, 12/01/21, MD   400 mg at 11/28/21 2125  ? desmopressin (DDAVP) tablet 0.2 mg  0.2 mg Oral QHS Clapacs, John T, MD   0.2 mg at 11/28/21 2128  ? glycopyrrolate (ROBINUL) injection 0.3 mg  0.3 mg Intravenous Once Clapacs, John T, MD      ? imipramine (TOFRANIL) tablet 100 mg  100 mg Oral QHS Clapacs, John T, MD   100 mg at 11/28/21 2129  ? ketorolac (TORADOL) 30 MG/ML injection 30 mg  30 mg Intravenous Once Clapacs, John T, MD      ? ketotifen (ZADITOR) 0.025 % ophthalmic solution  2 drop  2 drop Left Eye Q4H PRN Reggie Pile, MD   2 drop at 11/25/21 1624  ? lithium carbonate (ESKALITH) CR tablet 450 mg  450 mg Oral Q12H Clapacs, Jackquline Denmark, MD   450 mg at 11/29/21 0825  ? loratadine (CLARITIN) tablet 10 mg  10 mg Oral Daily Clapacs, John T, MD   10 mg at 11/29/21 8416  ? magnesium hydroxide (MILK OF MAGNESIA) suspension 30 mL  30 mL Oral Daily PRN Gabriel Cirri F, NP      ? metFORMIN (GLUCOPHAGE) tablet 500 mg  500 mg Oral BID WC Clapacs, Jackquline Denmark, MD   500 mg at 11/29/21 0825  ? metoprolol succinate (TOPROL-XL) 24 hr tablet 100 mg  100 mg Oral Daily Gabriel Cirri F, NP   100 mg at 11/29/21 0825  ? midazolam (VERSED) injection 2 mg  2 mg Intravenous Once Clapacs, John T, MD      ? OLANZapine (ZYPREXA) tablet 20 mg  20 mg Oral QHS Clapacs, Jackquline Denmark, MD   20 mg at 11/28/21 2125  ? ondansetron (ZOFRAN) injection 4 mg  4 mg Intravenous Once PRN Corinda Gubler, MD       ? venlafaxine XR (EFFEXOR-XR) 24 hr capsule 75 mg  75 mg Oral Q breakfast Clapacs, Jackquline Denmark, MD   75 mg at 11/29/21 0825  ? ? ? ?Discharge Medications: ?Please see discharge summary for a list of discharge medications. ? ?Relevant Imaging Results: ? ?Relevant Lab Results: ? ? ?Additional Information ?[address] [place], [place], [phone] ? ?Harden Mo, LCSW ? ? ? ? ?

## 2021-11-29 NOTE — Group Note (Signed)
[facility] LCSW Group Therapy Note ? ? ?Group Date: 11/29/2021 ?Start Time: 1300 ?End Time: 1400 ? ? ?Type of Therapy/Topic:  Group Therapy:  Balance in Life ? ?Participation Level:  Active  ? ?Description of Group:   ? This group will address the concept of balance and how it feels and looks when one is unbalanced. Patients will be encouraged to process areas in their lives that are out of balance, and identify reasons for remaining unbalanced. Facilitators will guide patients utilizing problem- solving interventions to address and correct the stressor making their life unbalanced. Understanding and applying boundaries will be explored and addressed for obtaining  and maintaining a balanced life. Patients will be encouraged to explore ways to assertively make their unbalanced needs known to significant others in their lives, using other group members and facilitator for support and feedback. ? ?Therapeutic Goals: ?Patient will identify two or more emotions or situations they have that consume much of in their lives. ?Patient will identify signs/triggers that life has become out of balance:  ?Patient will identify two ways to set boundaries in order to achieve balance in their lives:  ?Patient will demonstrate ability to communicate their needs through discussion and/or role plays ? ?Summary of Patient Progress: ?Patient was present in group. Patient shared that when he thinks of balance he thinks "measure, scale"  Patient struggled with remaining on topic.  Patient appeared to begin to get focused on religion during group.  CSW was able to redirect the patient to topic.   ? ?Therapeutic Modalities:   ?Cognitive Behavioral Therapy ?Solution-Focused Therapy ?Assertiveness Training ? ? ?Harden Mo, LCSW ?

## 2021-11-29 NOTE — Progress Notes (Signed)
Patient ID: Jonathan Ar., male   DOB: 1985-12-01, 36 y.o.   MRN: [id number]  Discharge Note:  Patient denies SI/HI/AVH at this time. Discharge instructions, AVS, prescriptions, and transition record gone over with patient. Patient agrees to comply with medication management, follow-up visit, and outpatient therapy. Patient belongings returned to patient. Patient questions and concerns addressed and answered. Patient ambulatory off unit. Patient discharged back to Group Home with group home staff.

## 2021-11-29 NOTE — Progress Notes (Signed)
Recreation Therapy Notes ? ? ?Date: 11/29/2021 ? ?Time: 10:00 am   ? ?Location: Court yard ? ?Behavioral response: N/A ?  ?Intervention Topic: Wellness  ? ?Discussion/Intervention: ?Patient refused to attend group.  ? ?Clinical Observations/Feedback:  ?Patient refused to attend group.  ?  ?Jaynee Winters LRT/CTRS ? ? ? ? ? ? ? ?Glennda Weatherholtz ?11/29/2021 11:58 AM ?

## 2021-12-27 ENCOUNTER — Other Ambulatory Visit: Payer: Self-pay | Admitting: Psychiatry

## 2021-12-28 ENCOUNTER — Ambulatory Visit: Payer: Self-pay | Admitting: Anesthesiology

## 2021-12-28 ENCOUNTER — Encounter
Admission: RE | Admit: 2021-12-28 | Discharge: 2021-12-28 | Disposition: A | Discharge status: Home / Self Care | Payer: 59 | Source: Ambulatory Visit | Attending: Psychiatry | Admitting: Psychiatry

## 2021-12-28 ENCOUNTER — Encounter: Payer: Self-pay | Admitting: Anesthesiology

## 2021-12-28 DIAGNOSIS — F259 Schizoaffective disorder, unspecified: Secondary | ICD-10-CM | POA: Insufficient documentation

## 2021-12-28 DIAGNOSIS — F17210 Nicotine dependence, cigarettes, uncomplicated: Secondary | ICD-10-CM | POA: Diagnosis not present

## 2021-12-28 DIAGNOSIS — F250 Schizoaffective disorder, bipolar type: Secondary | ICD-10-CM

## 2021-12-28 MED ORDER — ONDANSETRON HCL 4 MG/2ML IJ SOLN: 4.0000 mg | Freq: Once | INTRAMUSCULAR | Status: DC | PRN

## 2021-12-28 MED ORDER — SODIUM CHLORIDE 0.9 % IV SOLN: 500.0000 mL | Freq: Once | INTRAVENOUS | Status: DC

## 2021-12-28 MED ORDER — ACETAMINOPHEN 160 MG/5ML PO SOLN
325.0000 mg | ORAL | Status: DC | PRN
  Filled 2021-12-28: qty 20.3

## 2021-12-28 MED ORDER — MIDAZOLAM HCL 2 MG/2ML IJ SOLN
INTRAMUSCULAR | Status: DC | PRN
  Administered 2021-12-28: 2 mg via INTRAVENOUS

## 2021-12-28 MED ORDER — KETOROLAC TROMETHAMINE 30 MG/ML IJ SOLN: 30.0000 mg | Freq: Once | INTRAMUSCULAR | Status: DC

## 2021-12-28 MED ORDER — LABETALOL HCL 5 MG/ML IV SOLN
INTRAVENOUS | Status: DC | PRN
  Administered 2021-12-28: 10 mg via INTRAVENOUS

## 2021-12-28 MED ORDER — SODIUM CHLORIDE 0.9 % IV SOLN: INTRAVENOUS | Status: DC | PRN

## 2021-12-28 MED ORDER — GLYCOPYRROLATE 0.2 MG/ML IJ SOLN
INTRAMUSCULAR | Status: AC
  Filled 2021-12-28: qty 1

## 2021-12-28 MED ORDER — SODIUM CHLORIDE 0.9 % IV SOLN
500.0000 mL | Freq: Once | INTRAVENOUS | Status: AC
  Administered 2021-12-28: 500 mL via INTRAVENOUS

## 2021-12-28 MED ORDER — METHOHEXITAL SODIUM 100 MG/10ML IV SOSY
PREFILLED_SYRINGE | INTRAVENOUS | Status: DC | PRN
  Administered 2021-12-28: 70 mg via INTRAVENOUS

## 2021-12-28 MED ORDER — GLYCOPYRROLATE 0.2 MG/ML IJ SOLN: 0.3000 mg | Freq: Once | INTRAMUSCULAR | Status: DC

## 2021-12-28 MED ORDER — GLYCOPYRROLATE 0.2 MG/ML IJ SOLN
0.2000 mg | Freq: Once | INTRAMUSCULAR | Status: AC
  Administered 2021-12-28: 0.2 mg via INTRAVENOUS

## 2021-12-28 MED ORDER — MIDAZOLAM HCL 2 MG/2ML IJ SOLN
INTRAMUSCULAR | Status: AC
  Filled 2021-12-28: qty 2

## 2021-12-28 MED ORDER — MIDAZOLAM HCL 2 MG/2ML IJ SOLN: 2.0000 mg | Freq: Once | INTRAMUSCULAR | Status: DC

## 2021-12-28 MED ORDER — ACETAMINOPHEN 325 MG PO TABS: 650.0000 mg | ORAL_TABLET | Freq: Once | ORAL | Status: DC | PRN

## 2021-12-28 MED ORDER — SUCCINYLCHOLINE CHLORIDE 200 MG/10ML IV SOSY
PREFILLED_SYRINGE | INTRAVENOUS | Status: DC | PRN
  Administered 2021-12-28: 100 mg via INTRAVENOUS

## 2021-12-28 NOTE — Anesthesia Postprocedure Evaluation (Signed)
Anesthesia Post Note  Patient: Jonathan Alexander.  Procedure(s) Performed: ECT TX  Patient location during evaluation: PACU Anesthesia Type: General Level of consciousness: awake and alert, oriented and patient cooperative Pain management: pain level controlled Vital Signs Assessment: post-procedure vital signs reviewed and stable Respiratory status: spontaneous breathing, nonlabored ventilation and respiratory function stable Cardiovascular status: blood pressure returned to baseline and stable Postop Assessment: adequate PO intake Anesthetic complications: no   No notable events documented.   Last Vitals:  Vitals:   12/28/21 1250 12/28/21 1300  BP: 106/71 110/75  Pulse: 100 98  Resp: 15 19  Temp:    SpO2: 98% 97%    Last Pain:  Vitals:   12/28/21 1233  TempSrc:   PainSc: Asleep                 Reed Breech

## 2021-12-28 NOTE — Transfer of Care (Signed)
Immediate Anesthesia Transfer of Care Note  Patient: Jonathan Alexander.  Procedure(s) Performed: ECT TX  Patient Location: PACU  Anesthesia Type:General  Level of Consciousness: drowsy  Airway & Oxygen Therapy: Patient Spontanous Breathing and Patient connected to face mask oxygen  Post-op Assessment: Report given to RN and Post -op Vital signs reviewed and stable  Post vital signs: Reviewed and stable  Last Vitals:  Vitals Value Taken Time  BP 103/65 12/28/21 1233  Temp    Pulse 96 12/28/21 1235  Resp 20 12/28/21 1235  SpO2 100 % 12/28/21 1235  Vitals shown include unvalidated device data.  Last Pain:  Vitals:   12/28/21 1118  TempSrc: Tympanic  PainSc: 0-No pain         Complications: No notable events documented.

## 2021-12-28 NOTE — Anesthesia Preprocedure Evaluation (Signed)
Anesthesia Evaluation  Patient identified by MRN, date of birth, ID band Patient awake    Reviewed: Allergy & Precautions, NPO status , Patient's Chart, lab work & pertinent test results  History of Anesthesia Complications Negative for: history of anesthetic complications  Airway Mallampati: III   Neck ROM: Full    Dental  (+)    Pulmonary Current Smoker and Patient abstained from smoking.,    Pulmonary exam normal breath sounds clear to auscultation       Cardiovascular hypertension, Normal cardiovascular exam Rhythm:Regular Rate:Normal  ECG 12/05/20:  Sinus tachycardia Nonspecific T wave abnormality   Neuro/Psych PSYCHIATRIC DISORDERS Bipolar Disorder Schizophrenia negative neurological ROS     GI/Hepatic negative GI ROS,   Endo/Other  Obesity; prediabetes  Renal/GU negative Renal ROS     Musculoskeletal   Abdominal   Peds  Hematology negative hematology ROS (+)   Anesthesia Other Findings   Reproductive/Obstetrics                             Anesthesia Physical  Anesthesia Plan  ASA: 2  Anesthesia Plan: General   Post-op Pain Management:    Induction: Intravenous  PONV Risk Score and Plan: 1 and TIVA and Treatment may vary due to age or medical condition  Airway Management Planned: Natural Airway  Additional Equipment:   Intra-op Plan:   Post-operative Plan:   Informed Consent: I have reviewed the patients History and Physical, chart, labs and discussed the procedure including the risks, benefits and alternatives for the proposed anesthesia with the patient or authorized representative who has indicated his/her understanding and acceptance.       Plan Discussed with: CRNA  Anesthesia Plan Comments: (Serial consent on chart.  LMA/GETA backup discussed.  Patient consented for risks of anesthesia including but not limited to:  - adverse reactions to medications -  damage to eyes, teeth, lips or other oral mucosa - nerve damage due to positioning  - sore throat or hoarseness - damage to heart, brain, nerves, lungs, other parts of body or loss of life  Informed patient about role of CRNA in peri- and intra-operative care.  Patient voiced understanding.)        Anesthesia Quick Evaluation  

## 2021-12-28 NOTE — H&P (Signed)
Jonathan Alexander. is an 36 y.o. male.   Chief Complaint: No complaint.  Says his mood is feeling fine.  Denies any agitation or paranoia HPI: Schizoaffective disorder with occasional bouts of agitation but generally pretty well controlled on ECT and medicine  Past Medical History:  Diagnosis Date   Schizophrenia (HCC)    Tachycardia     History reviewed. No pertinent surgical history.  Family History  Family history unknown: Yes   Social History:  reports that he has been smoking cigarettes. He has a 4.00 pack-year smoking history. He has never used smokeless tobacco. He reports that he does not drink alcohol and does not use drugs.  Allergies:  Allergies  Allergen Reactions   Divalproex Sodium    Shellfish-Derived Products     (Not in a hospital admission)   No results found for this or any previous visit (from the past 48 hour(s)). No results found.  Review of Systems  Constitutional: Negative.   HENT: Negative.    Eyes: Negative.   Respiratory: Negative.    Cardiovascular: Negative.   Gastrointestinal: Negative.   Musculoskeletal: Negative.   Skin: Negative.   Neurological: Negative.   Psychiatric/Behavioral: Negative.     Blood pressure 125/78, pulse 97, temperature 98.1 F (36.7 C), resp. rate 14, height 5\' 9"  (1.753 m), weight 98.2 kg, SpO2 100 %. Physical Exam Vitals and nursing note reviewed.  Constitutional:      Appearance: He is well-developed.  HENT:     Head: Normocephalic and atraumatic.  Eyes:     Conjunctiva/sclera: Conjunctivae normal.     Pupils: Pupils are equal, round, and reactive to light.  Cardiovascular:     Heart sounds: Normal heart sounds.  Pulmonary:     Effort: Pulmonary effort is normal.  Abdominal:     Palpations: Abdomen is soft.  Musculoskeletal:        General: Normal range of motion.     Cervical back: Normal range of motion.  Skin:    General: Skin is warm and dry.  Neurological:     General: No focal deficit  present.     Mental Status: He is alert.  Psychiatric:        Mood and Affect: Mood normal.     Assessment/Plan Next treatment in 4 weeks.  , MD 12/28/2021, 2:34 PM

## 2021-12-28 NOTE — Procedures (Signed)
ECT SERVICES Physician's Interval Evaluation & Treatment Note  Patient Identification: Jonathan Alexander. MRN:  [id number] Date of Evaluation:  12/28/2021 TX #: [id number]  MADRS:   MMSE:   P.E. Findings:  No change to physical exam  Psychiatric Interval Note:  Mood pretty stable no agitation and no evidence of psychosis  Subjective:  Patient is a 36 y.o. male seen for evaluation for Electroconvulsive Therapy. No complaint  Treatment Summary:   []   Right Unilateral             [x]  Bilateral   % Energy : 1.0 ms 60%   Impedance: 1100 ohms  Seizure Energy Index: 11,060 V squared  Postictal Suppression Index: 95%  Seizure Concordance Index: 98%  Medications  Pre Shock: Robinul 0.2 mg ketamine 80 mg Brevital 70 mg succinylcholine 100 mg  Post Shock: Versed 2 mg  Seizure Duration: 20 seconds EMG 46 seconds EEG   Comments: Follow-up 4 weeks  Lungs:  [x]   Clear to auscultation               []  Other:   Heart:    [x]   Regular rhythm             []  irregular rhythm    [x]   Previous H&P reviewed, patient examined and there are NO CHANGES                 []   Previous H&P reviewed, patient examined and there are changes noted.   , MD 5/26/20232:35 PM

## 2022-01-29 ENCOUNTER — Other Ambulatory Visit: Payer: Self-pay | Admitting: Psychiatry

## 2022-01-30 ENCOUNTER — Encounter: Payer: Self-pay | Admitting: Anesthesiology

## 2022-01-30 ENCOUNTER — Encounter
Admission: RE | Admit: 2022-01-30 | Discharge: 2022-01-30 | Disposition: A | Discharge status: Home / Self Care | Payer: 59 | Source: Ambulatory Visit | Attending: Psychiatry | Admitting: Psychiatry

## 2022-01-30 ENCOUNTER — Ambulatory Visit: Payer: Self-pay | Admitting: Anesthesiology

## 2022-01-30 DIAGNOSIS — F259 Schizoaffective disorder, unspecified: Secondary | ICD-10-CM | POA: Insufficient documentation

## 2022-01-30 DIAGNOSIS — F17210 Nicotine dependence, cigarettes, uncomplicated: Secondary | ICD-10-CM | POA: Diagnosis not present

## 2022-01-30 DIAGNOSIS — F250 Schizoaffective disorder, bipolar type: Secondary | ICD-10-CM

## 2022-01-30 MED ORDER — LABETALOL HCL 5 MG/ML IV SOLN
INTRAVENOUS | Status: DC | PRN
  Administered 2022-01-30: 10 mg via INTRAVENOUS

## 2022-01-30 MED ORDER — MIDAZOLAM HCL 2 MG/2ML IJ SOLN: 4.0000 mg | Freq: Once | INTRAMUSCULAR | Status: DC

## 2022-01-30 MED ORDER — MIDAZOLAM HCL 2 MG/2ML IJ SOLN
INTRAMUSCULAR | Status: DC | PRN
  Administered 2022-01-30: 2 mg via INTRAVENOUS

## 2022-01-30 MED ORDER — KETOROLAC TROMETHAMINE 30 MG/ML IJ SOLN
30.0000 mg | Freq: Once | INTRAMUSCULAR | Status: AC
  Administered 2022-01-30: 30 mg via INTRAVENOUS

## 2022-01-30 MED ORDER — MIDAZOLAM HCL 2 MG/2ML IJ SOLN
INTRAMUSCULAR | Status: AC
  Filled 2022-01-30: qty 2

## 2022-01-30 MED ORDER — METHOHEXITAL SODIUM 0.5 G IJ SOLR
INTRAMUSCULAR | Status: AC
  Filled 2022-01-30: qty 500

## 2022-01-30 MED ORDER — KETOROLAC TROMETHAMINE 30 MG/ML IJ SOLN
INTRAMUSCULAR | Status: AC
  Filled 2022-01-30: qty 1

## 2022-01-30 MED ORDER — GLYCOPYRROLATE 0.2 MG/ML IJ SOLN
INTRAMUSCULAR | Status: AC
  Filled 2022-01-30: qty 2

## 2022-01-30 MED ORDER — METHOHEXITAL SODIUM 100 MG/10ML IV SOSY
PREFILLED_SYRINGE | INTRAVENOUS | Status: DC | PRN
  Administered 2022-01-30: 70 mg via INTRAVENOUS

## 2022-01-30 MED ORDER — SODIUM CHLORIDE 0.9 % IV SOLN: INTRAVENOUS | Status: DC | PRN

## 2022-01-30 MED ORDER — GLYCOPYRROLATE 0.2 MG/ML IJ SOLN
0.3000 mg | Freq: Once | INTRAMUSCULAR | Status: AC
  Administered 2022-01-30: 0.3 mg via INTRAVENOUS

## 2022-01-30 MED ORDER — SODIUM CHLORIDE 0.9 % IV SOLN
500.0000 mL | Freq: Once | INTRAVENOUS | Status: AC
  Administered 2022-01-30: 500 mL via INTRAVENOUS

## 2022-01-30 MED ORDER — SUCCINYLCHOLINE CHLORIDE 200 MG/10ML IV SOSY
PREFILLED_SYRINGE | INTRAVENOUS | Status: DC | PRN
  Administered 2022-01-30: 100 mg via INTRAVENOUS

## 2022-01-30 NOTE — H&P (Signed)
Jonathan Alexander. is an 36 y.o. male.   Chief Complaint: No complaints.  Mood stable.  No report of psychotic symptoms HPI: Schizoaffective disorder.  Recent mania but now better controlled.  Stable last couple months  Past Medical History:  Diagnosis Date   Schizophrenia (HCC)    Tachycardia     History reviewed. No pertinent surgical history.  Family History  Family history unknown: Yes   Social History:  reports that he has been smoking cigarettes. He has a 4.00 pack-year smoking history. He has never used smokeless tobacco. He reports that he does not drink alcohol and does not use drugs.  Allergies:  Allergies  Allergen Reactions   Divalproex Sodium    Shellfish-Derived Products     (Not in a hospital admission)   No results found for this or any previous visit (from the past 48 hour(s)). No results found.  Review of Systems  Constitutional: Negative.   HENT: Negative.    Eyes: Negative.   Respiratory: Negative.    Cardiovascular: Negative.   Gastrointestinal: Negative.   Musculoskeletal: Negative.   Skin: Negative.   Neurological: Negative.   Psychiatric/Behavioral: Negative.      Blood pressure 112/77, pulse 93, temperature 98.4 F (36.9 C), temperature source Oral, resp. rate 19, height 5\' 9"  (1.753 m), weight 99.2 kg, SpO2 96 %. Physical Exam Vitals reviewed.  Constitutional:      Appearance: He is well-developed.  HENT:     Head: Normocephalic and atraumatic.  Eyes:     Conjunctiva/sclera: Conjunctivae normal.     Pupils: Pupils are equal, round, and reactive to light.  Cardiovascular:     Heart sounds: Normal heart sounds.  Pulmonary:     Effort: Pulmonary effort is normal.  Abdominal:     Palpations: Abdomen is soft.  Musculoskeletal:        General: Normal range of motion.     Cervical back: Normal range of motion.  Skin:    General: Skin is warm and dry.  Neurological:     General: No focal deficit present.     Mental Status: He is  alert.  Psychiatric:        Mood and Affect: Mood normal.      Assessment/Plan ECT today and I think we can go back to a 6-week schedule for next treatment  , MD 01/30/2022, 5:12 PM

## 2022-01-30 NOTE — Anesthesia Preprocedure Evaluation (Signed)
Anesthesia Evaluation  Patient identified by MRN, date of birth, ID band Patient awake    Reviewed: Allergy & Precautions, NPO status , Patient's Chart, lab work & pertinent test results  History of Anesthesia Complications Negative for: history of anesthetic complications  Airway Mallampati: III   Neck ROM: Full    Dental  (+)    Pulmonary Current Smoker and Patient abstained from smoking.,    Pulmonary exam normal breath sounds clear to auscultation       Cardiovascular hypertension, Normal cardiovascular exam Rhythm:Regular Rate:Normal  ECG 12/05/20:  Sinus tachycardia Nonspecific T wave abnormality   Neuro/Psych PSYCHIATRIC DISORDERS Bipolar Disorder Schizophrenia negative neurological ROS     GI/Hepatic negative GI ROS,   Endo/Other  Obesity; prediabetes  Renal/GU negative Renal ROS     Musculoskeletal   Abdominal   Peds  Hematology negative hematology ROS (+)   Anesthesia Other Findings   Reproductive/Obstetrics                             Anesthesia Physical  Anesthesia Plan  ASA: 2  Anesthesia Plan: General   Post-op Pain Management:    Induction: Intravenous  PONV Risk Score and Plan: 1 and TIVA and Treatment may vary due to age or medical condition  Airway Management Planned: Natural Airway  Additional Equipment:   Intra-op Plan:   Post-operative Plan:   Informed Consent: I have reviewed the patients History and Physical, chart, labs and discussed the procedure including the risks, benefits and alternatives for the proposed anesthesia with the patient or authorized representative who has indicated his/her understanding and acceptance.       Plan Discussed with: CRNA  Anesthesia Plan Comments: (Serial consent on chart.  LMA/GETA backup discussed.  Patient consented for risks of anesthesia including but not limited to:  - adverse reactions to medications -  damage to eyes, teeth, lips or other oral mucosa - nerve damage due to positioning  - sore throat or hoarseness - damage to heart, brain, nerves, lungs, other parts of body or loss of life  Informed patient about role of CRNA in peri- and intra-operative care.  Patient voiced understanding.)        Anesthesia Quick Evaluation

## 2022-01-30 NOTE — Procedures (Signed)
ECT SERVICES Physician's Interval Evaluation & Treatment Note  Patient Identification: Jonathan Alexander. MRN:  [id number] Date of Evaluation:  01/30/2022 TX #: 63  MADRS:   MMSE:   P.E. Findings:  No change to physical exam  Psychiatric Interval Note:  Mood and behavior stable without any sign of psychosis  Subjective:  Patient is a 36 y.o. male seen for evaluation for Electroconvulsive Therapy. No complaint  Treatment Summary:   []   Right Unilateral             [x]  Bilateral   % Energy : 1.0 ms 60%   Impedance: 1470 ohms  Seizure Energy Index: 12,937 V squared  Postictal Suppression Index: 90%  Seizure Concordance Index: 93%  Medications  Pre Shock: Robinul 0.2 mg Brevital 70 mg succinylcholine 100 mg  Post Shock: Versed 2 mg  Seizure Duration: 15 seconds EMG 41 seconds EEG   Comments: Seems that he can do fine without the ketamine we will stick with this combination for now.  Follow-up 6 weeks  Lungs:  [x]   Clear to auscultation               []  Other:   Heart:    [x]   Regular rhythm             []  irregular rhythm    [x]   Previous H&P reviewed, patient examined and there are NO CHANGES                 []   Previous H&P reviewed, patient examined and there are changes noted.   , MD 6/28/20235:13 PM

## 2022-01-30 NOTE — Transfer of Care (Signed)
Immediate Anesthesia Transfer of Care Note  Patient: Jonathan Alexander.  Procedure(s) Performed: ECT TX  Patient Location: PACU  Anesthesia Type:General  Level of Consciousness: drowsy  Airway & Oxygen Therapy: Patient Spontanous Breathing and Patient connected to nasal cannula oxygen  Post-op Assessment: Report given to RN, Post -op Vital signs reviewed and stable and Patient moving all extremities  Post vital signs: Reviewed and stable  Last Vitals:  Vitals Value Taken Time  BP 99/51 01/30/22 1315  Temp    Pulse 107 01/30/22 1318  Resp 17 01/30/22 1318  SpO2 98 % 01/30/22 1318  Vitals shown include unvalidated device data.  Last Pain:  Vitals:   01/30/22 1129  TempSrc:   PainSc: 0-No pain         Complications: No notable events documented.

## 2022-02-02 IMAGING — CR DG ABDOMEN ACUTE W/ 1V CHEST
1 series · 5 of 5 positions shown · non-contrast
Comparison: 05/17/2019 chest radiograph

CLINICAL DATA: Acute vomiting.

EXAM:
DG ABDOMEN ACUTE W/ 1V CHEST

[Series 1: view not recorded · 0.14mm/px · 5 of 5 slices shown]
[im 1/5]
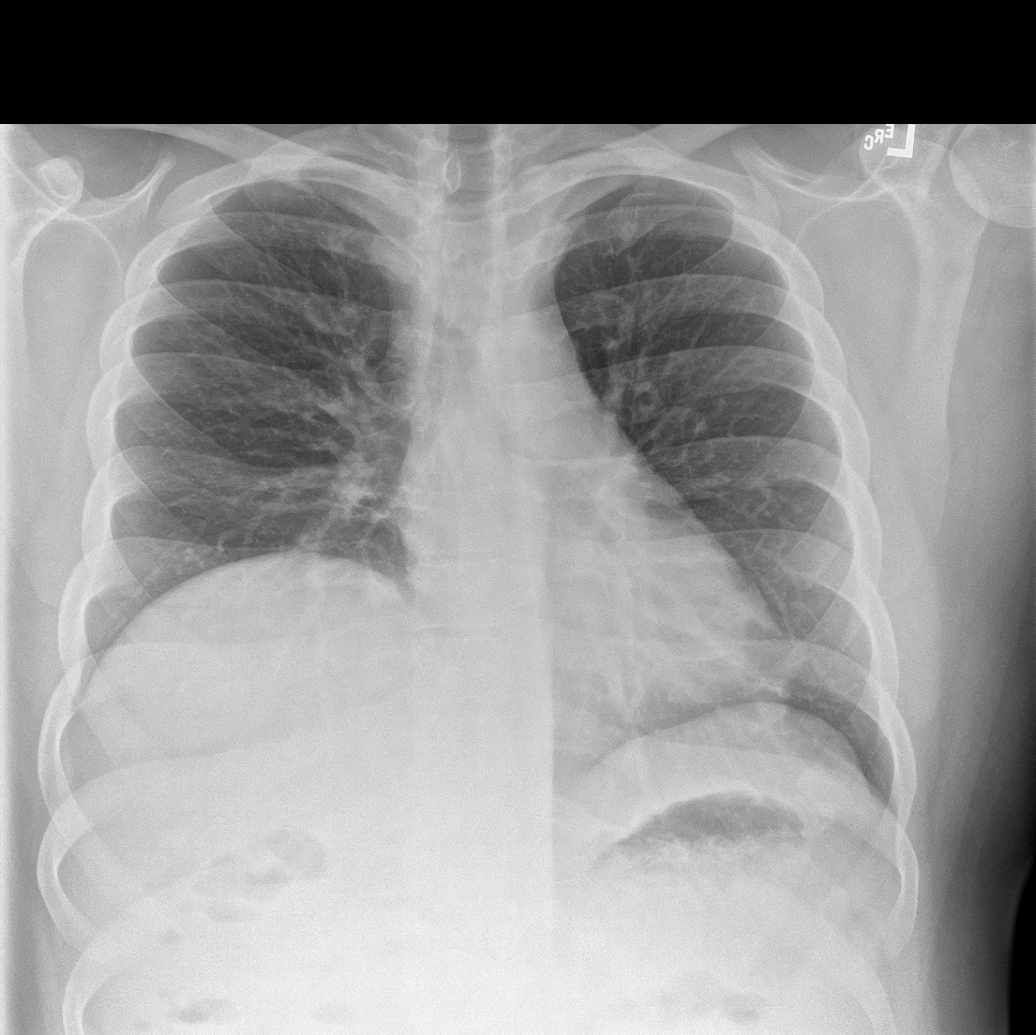
[im 2/5]
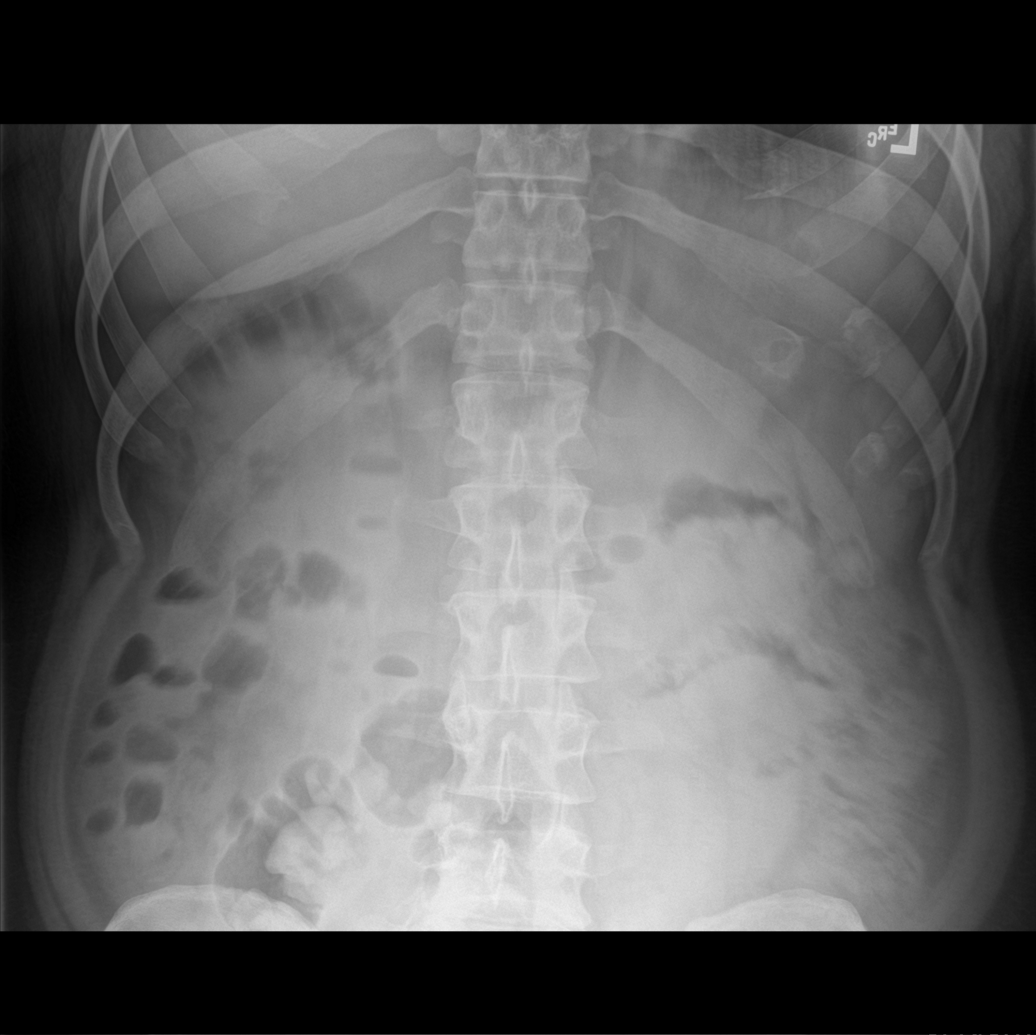
[im 3/5]
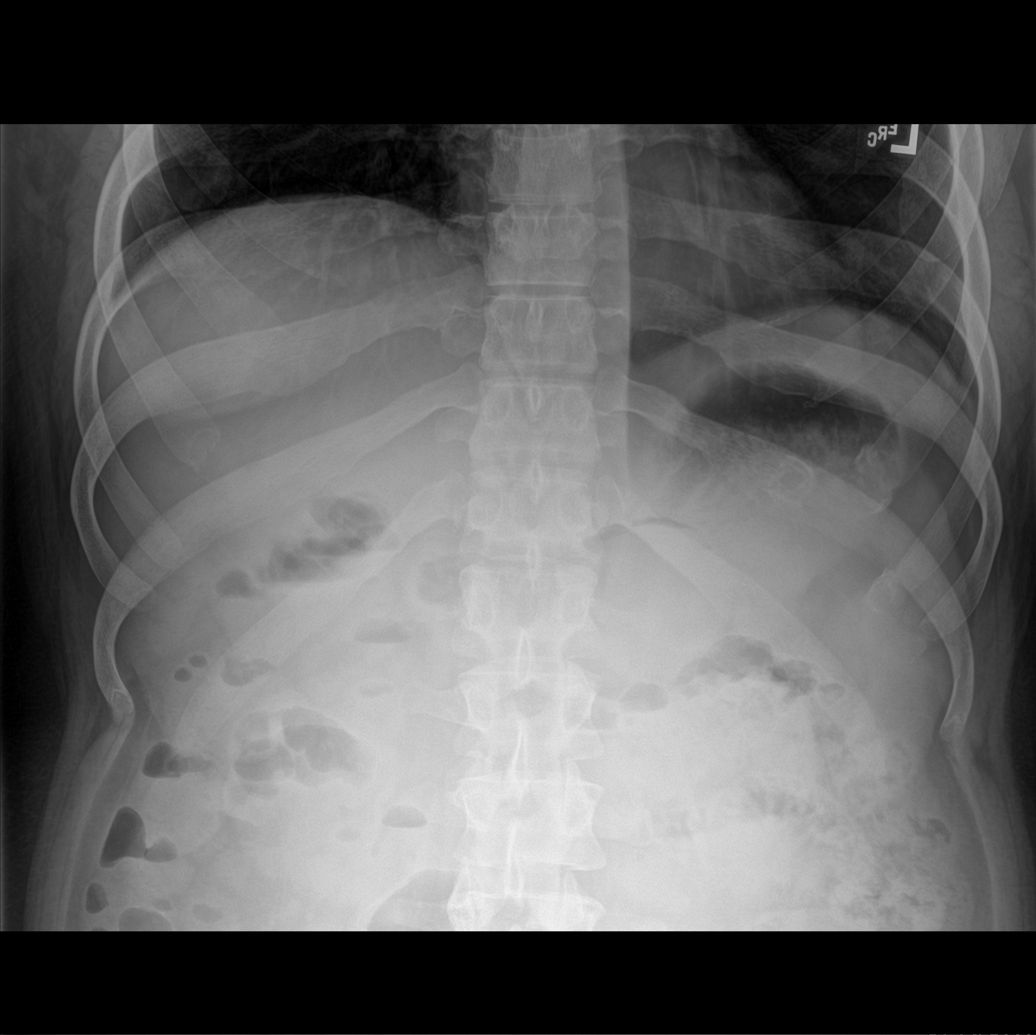
[im 4/5]
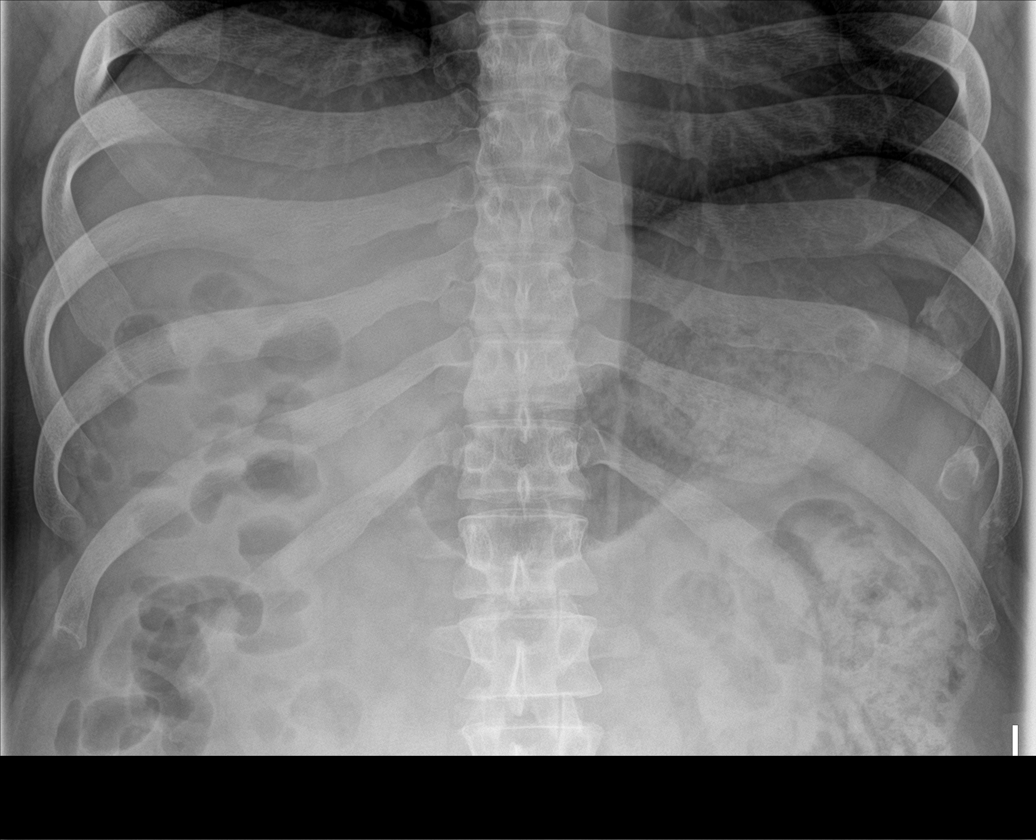
[im 5/5]
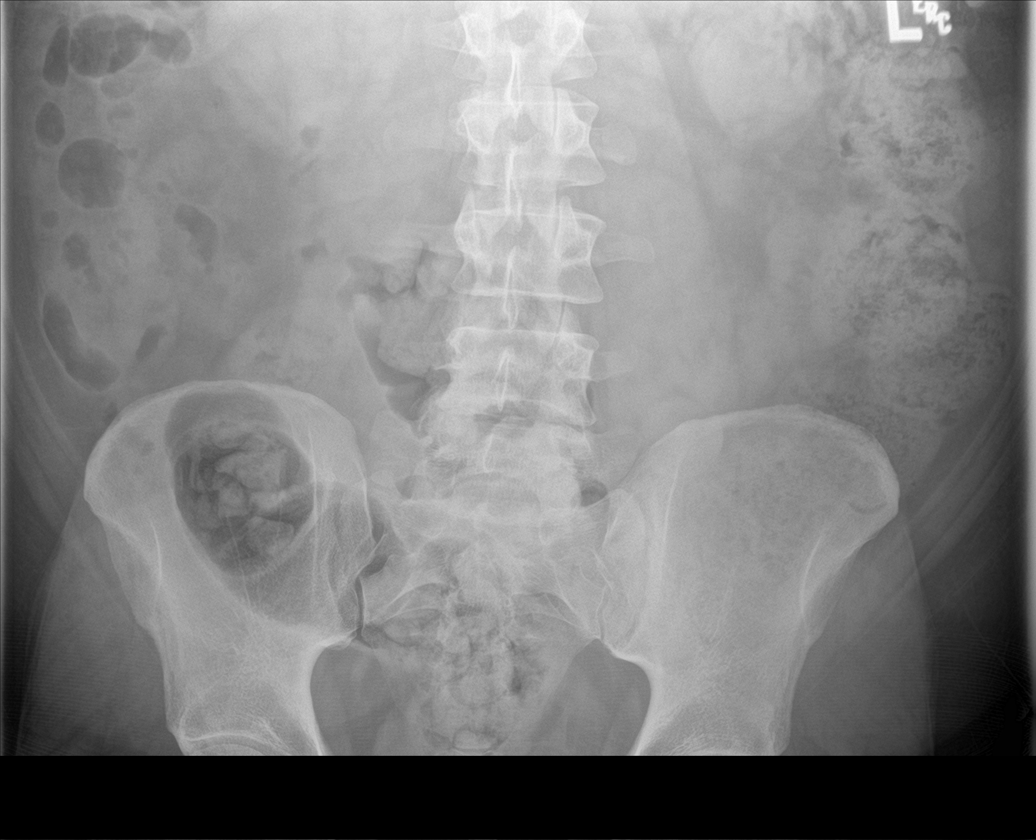

[5 of 5 positions shown; findings below may reference images not displayed]

FINDINGS: Cardiomediastinal silhouette is unremarkable.

Elevation of the RIGHT hemidiaphragm is again noted.

The lungs are clear.

Few scattered nondistended gas and fluid-filled loops of small bowel
are noted.

No dilated bowel loops are present.

A small to moderate amount of stool within the colon is noted.

There is no evidence of pneumoperitoneum or suspicious
calcifications.

No acute bony abnormalities are identified.
IMPRESSION: 1. Nonspecific nonobstructive bowel gas pattern. No evidence of
pneumoperitoneum.
2. No evidence of acute cardiopulmonary disease.

## 2022-02-05 NOTE — Anesthesia Postprocedure Evaluation (Signed)
Anesthesia Post Note  Patient: Jonathan Alexander.  Procedure(s) Performed: ECT TX  Patient location during evaluation: PACU Anesthesia Type: General Level of consciousness: awake and alert Pain management: pain level controlled Vital Signs Assessment: post-procedure vital signs reviewed and stable Respiratory status: spontaneous breathing, nonlabored ventilation, respiratory function stable and patient connected to nasal cannula oxygen Cardiovascular status: blood pressure returned to baseline and stable Postop Assessment: no apparent nausea or vomiting Anesthetic complications: no   No notable events documented.   Last Vitals:  Vitals:   01/30/22 1440 01/30/22 1450  BP: 113/79 112/77  Pulse: 97 93  Resp: 19 19  Temp:  36.9 C  SpO2: 96%     Last Pain:  Vitals:   01/30/22 1450  TempSrc: Oral  PainSc: 0-No pain                 Lenard Simmer

## 2022-02-09 ENCOUNTER — Other Ambulatory Visit: Payer: Self-pay

## 2022-02-09 ENCOUNTER — Emergency Department
Admission: EM | Admit: 2022-02-09 | Discharge: 2022-02-10 | Disposition: A | Discharge status: Home / Self Care | Payer: 59 | Attending: Emergency Medicine | Admitting: Emergency Medicine

## 2022-02-09 ENCOUNTER — Encounter: Payer: Self-pay | Admitting: Emergency Medicine

## 2022-02-09 DIAGNOSIS — F209 Schizophrenia, unspecified: Secondary | ICD-10-CM

## 2022-02-09 DIAGNOSIS — Z046 Encounter for general psychiatric examination, requested by authority: Secondary | ICD-10-CM | POA: Diagnosis present

## 2022-02-09 DIAGNOSIS — I10 Essential (primary) hypertension: Secondary | ICD-10-CM | POA: Diagnosis not present

## 2022-02-09 DIAGNOSIS — F250 Schizoaffective disorder, bipolar type: Secondary | ICD-10-CM | POA: Insufficient documentation

## 2022-02-09 NOTE — ED Notes (Signed)
Psych NP speaking with patient 

## 2022-02-09 NOTE — ED Notes (Signed)
Snack given.

## 2022-02-09 NOTE — ED Triage Notes (Signed)
Pt presents to ER from an adult home brought in by [facility], pt reports he is confused he has been taking things too hard. Per Carole Civil pt called 911 saying he wants to hurt himself. Per deputy he talked to staff at group home and was informed he recently had some medication adjustments Jethro Bolus [phone]. ) Pt denies any SI or HI at present denies any visual or auditory hallucinations.  Pt says" I might  be hearing stuff, may be is nothing"  Per deputy to call C-com when pt is ready to be discharged so someone can take him back to [facility].

## 2022-02-09 NOTE — ED Notes (Signed)
Patient states he is here for spiritual reasons and having bad thoughts (pt will not provide further).  Patient calm and cooperative at this time.

## 2022-02-09 NOTE — ED Notes (Addendum)
Pair of black slides, black shorts, gray shirt, gray watch, gray necklace, silver and white cell phone

## 2022-02-09 NOTE — ED Provider Notes (Signed)
[facility] Provider Note    Event Date/Time   First MD Initiated Contact with Patient 02/09/22 1959     (approximate)   History   Chief Complaint: Psychiatric Evaluation   HPI  Jonathan Travor Royce. is a 36 y.o. male with a history of hypertension and schizoaffective disorder who was brought to the ED from his adult care facility due to patient feeling anxious and confused.  Patient called 911 reportedly stating that he wanted to hurt himself.  To me the patient denies SI HI or any hallucinations.  Patient states "I just think about God all day, and that is not good because it is like taking advantage of him."  Patient request anxiety medication.  Denies any fever vomiting or pain.  No other acute symptom     Physical Exam   Triage Vital Signs: ED Triage Vitals  Enc Vitals Group     BP 02/09/22 1845 133/90     Pulse Rate 02/09/22 1845 98     Resp 02/09/22 1845 16     Temp 02/09/22 1845 98 F (36.7 C)     Temp Source 02/09/22 1845 Oral     SpO2 02/09/22 1845 100 %     Weight 02/09/22 1846 211 lb (95.7 kg)     Height 02/09/22 1846 5\' 11"  (1.803 m)     Head Circumference --      Peak Flow --      Pain Score 02/09/22 1846 0     Pain Loc --      Pain Edu? --      Excl. in GC? --     Most recent vital signs: Vitals:   02/09/22 1845  BP: 133/90  Pulse: 98  Resp: 16  Temp: 98 F (36.7 C)  SpO2: 100%    General: Awake, no distress.  CV:  Good peripheral perfusion.  Resp:  Normal effort.  Abd:  No distention.  Other:  Normal speech, cranial nerves intact.  No evidence of trauma   ED Results / Procedures / Treatments   Labs (all labs ordered are listed, but only abnormal results are displayed) Labs Reviewed  ACETAMINOPHEN LEVEL  COMPREHENSIVE METABOLIC PANEL  ETHANOL  SALICYLATE LEVEL  CBC WITH DIFFERENTIAL/PLATELET  URINE DRUG SCREEN, QUALITATIVE ([facility] ONLY)      EKG    RADIOLOGY    PROCEDURES:  Procedures   MEDICATIONS ORDERED IN ED: Medications - No data to display   IMPRESSION / MDM / ASSESSMENT AND PLAN / ED COURSE  I reviewed the triage vital signs and the nursing notes.                              Differential diagnosis includes, but is not limited to, intoxication, electrolyte abnormality, psychosis, chronic baseline schizophrenia, anxiety  Patient's presentation is most consistent with exacerbation of chronic illness.  Patient presents with complaint of confusion.  Expresses odd religious ideas.  He is medically stable.  We will request behavioral medicine evaluation.  He is not an imminent danger to himself or others, not committable at this time.  The patient has been placed in psychiatric observation due to the need to provide a safe environment for the patient while obtaining psychiatric consultation and evaluation, as well as ongoing medical and medication management to treat the patient's condition.  The patient has not been placed under full IVC at this time.  FINAL CLINICAL IMPRESSION(S) / ED DIAGNOSES   Final diagnoses:  Schizophrenia, unspecified type (HCC)     Rx / DC Orders   ED Discharge Orders     None        Note:  This document was prepared using Dragon voice recognition software and may include unintentional dictation errors.   Sharman Cheek, MD 02/09/22 2154

## 2022-02-10 DIAGNOSIS — F250 Schizoaffective disorder, bipolar type: Secondary | ICD-10-CM

## 2022-02-10 LAB — COMPREHENSIVE METABOLIC PANEL
ALT: 13 U/L (ref 0–44)
AST: 17 U/L (ref 15–41)
Albumin: 4.4 g/dL (ref 3.5–5.0)
Alkaline Phosphatase: 83 U/L (ref 38–126)
Anion gap: 6 (ref 5–15)
BUN: 10 mg/dL (ref 6–20)
CO2: 28 mmol/L (ref 22–32)
Calcium: 9.9 mg/dL (ref 8.9–10.3)
Chloride: 106 mmol/L (ref 98–111)
Creatinine, Ser: 0.85 mg/dL (ref 0.61–1.24)
GFR, Estimated: >60 mL/min (ref >60)
Glucose, Bld: 117 mg/dL — ABNORMAL HIGH (ref 70–99)
Potassium: 4.1 mmol/L (ref 3.5–5.1)
Sodium: 140 mmol/L (ref 135–145)
Total Bilirubin: 0.4 mg/dL (ref 0.3–1.2)
Total Protein: 7.3 g/dL (ref 6.5–8.1)

## 2022-02-10 LAB — CBC WITH DIFFERENTIAL/PLATELET
Abs Immature Granulocytes: 0.05 10*3/uL (ref 0.00–0.07)
Basophils Absolute: 0 10*3/uL (ref 0.0–0.1)
Basophils Relative: 0 %
Eosinophils Absolute: 0 10*3/uL (ref 0.0–0.5)
Eosinophils Relative: 0 %
HCT: 40.7 % (ref 39.0–52.0)
Hemoglobin: 12.5 g/dL — ABNORMAL LOW (ref 13.0–17.0)
Immature Granulocytes: 1 %
Lymphocytes Relative: 23 %
Lymphs Abs: 2.3 10*3/uL (ref 0.7–4.0)
MCH: 30.3 pg (ref 26.0–34.0)
MCHC: 30.7 g/dL (ref 30.0–36.0)
MCV: 98.8 fL (ref 80.0–100.0)
Monocytes Absolute: 0.8 10*3/uL (ref 0.1–1.0)
Monocytes Relative: 8 %
Neutro Abs: 6.9 10*3/uL (ref 1.7–7.7)
Neutrophils Relative %: 68 %
Platelets: 351 10*3/uL (ref 150–400)
RBC: 4.12 MIL/uL — ABNORMAL LOW (ref 4.22–5.81)
RDW: 12.7 % (ref 11.5–15.5)
WBC: 10.2 10*3/uL (ref 4.0–10.5)
nRBC: 0 % (ref 0.0–0.2)

## 2022-02-10 MED ORDER — OLANZAPINE 10 MG PO TABS: 20.0000 mg | ORAL_TABLET | Freq: Every day | ORAL | Status: DC

## 2022-02-10 MED ORDER — METFORMIN HCL 500 MG PO TABS
500.0000 mg | ORAL_TABLET | Freq: Two times a day (BID) | ORAL | Status: DC
  Administered 2022-02-10: 500 mg via ORAL
  Filled 2022-02-10: qty 1

## 2022-02-10 MED ORDER — METOPROLOL SUCCINATE ER 50 MG PO TB24
100.0000 mg | ORAL_TABLET | Freq: Every day | ORAL | Status: DC
  Administered 2022-02-10: 100 mg via ORAL
  Filled 2022-02-10: qty 2

## 2022-02-10 MED ORDER — CLOZAPINE 100 MG PO TABS: 400.0000 mg | ORAL_TABLET | Freq: Every day | ORAL | Status: DC

## 2022-02-10 MED ORDER — ONDANSETRON 4 MG PO TBDP
8.0000 mg | ORAL_TABLET | Freq: Once | ORAL | Status: AC
  Administered 2022-02-10: 8 mg via ORAL
  Filled 2022-02-10: qty 2

## 2022-02-10 MED ORDER — LITHIUM CARBONATE ER 450 MG PO TBCR
450.0000 mg | EXTENDED_RELEASE_TABLET | Freq: Two times a day (BID) | ORAL | Status: DC
  Administered 2022-02-10: 450 mg via ORAL
  Filled 2022-02-10: qty 1

## 2022-02-10 MED ORDER — CLONAZEPAM 0.5 MG PO TABS
0.5000 mg | ORAL_TABLET | Freq: Three times a day (TID) | ORAL | Status: DC
  Administered 2022-02-10: 0.5 mg via ORAL
  Filled 2022-02-10: qty 1

## 2022-02-10 MED ORDER — CLONAZEPAM 0.5 MG PO TABS
0.5000 mg | ORAL_TABLET | Freq: Three times a day (TID) | ORAL | 0 refills | Status: DC
Start: 2022-02-10 — End: 2022-02-10

## 2022-02-10 MED ORDER — ATORVASTATIN CALCIUM 20 MG PO TABS: 10.0000 mg | ORAL_TABLET | Freq: Every day | ORAL | Status: DC

## 2022-02-10 MED ORDER — CLONAZEPAM 0.5 MG PO TABS: 0.5000 mg | ORAL_TABLET | Freq: Three times a day (TID) | ORAL | 1 refills | Status: AC

## 2022-02-10 MED ORDER — VENLAFAXINE HCL ER 75 MG PO CP24
75.0000 mg | ORAL_CAPSULE | Freq: Every day | ORAL | Status: DC
  Administered 2022-02-10: 75 mg via ORAL
  Filled 2022-02-10: qty 1

## 2022-02-10 NOTE — Consult Note (Signed)
Client continues to deny suicidal/homicidal ideations, hallucinations, and substance use.  His group home was contacted and the owner reports he has been stable on Klonopin 0.5 mg TID and his new provider moved it to daily.  Restarted this medication, Rx written, and psych cleared to return to his group home.  Nanine Means, PMHNP

## 2022-02-10 NOTE — Consult Note (Signed)
PHARMACIST - PHYSICIAN ORDER COMMUNICATION  Jonathan Chirag Krueger. is a 36 y.o. year old male with a history of schizoaffective disorder on Clozapine PTA. Continuing this medication order as an inpatient requires that monitoring parameters per REMS requirements must be met.   Clozapine REMS Dispense Authorization was obtained, and will dispense inpatient.  RDA code [id number] .  Verified Clozapine dose: Clozapine 472m qhs  Last ANC value and date reported on the Clozapine REMS website: 02/09/22(ANC 6.9) ANC monitoring frequency: weekly Next ANC reporting is due on (date) 02/16/22.  Jonathan Alexander A Shayana Hornstein 02/10/2022, 1:43 PM

## 2022-02-10 NOTE — ED Notes (Signed)
Pt taking shower.  

## 2022-02-10 NOTE — ED Notes (Signed)
Ms. Aundria Rud of group home will pick up patient after lunch.

## 2022-02-10 NOTE — BH Assessment (Signed)
Comprehensive Clinical Assessment (CCA) Note  02/10/2022 Jonathan Alexander [id number]  Chief Complaint:  Chief Complaint  Patient presents with   Psychiatric Evaluation   Schizophrenia   Visit Diagnosis: Schizophrenia   Jonathan Alexander. is a 36 y.o. male with a history of hypertension and schizoaffective disorder.Documentation below is as excerpted from the admission H&P note:  Pt presents to ER from an adult home brought in by [facility], pt reports he is confused he has been taking things too hard. Per Carole Civil pt called 911 saying he wants to hurt himself. Per deputy he talked to staff at group home and was informed he recently had some medication adjustments. He has maintained one stable mood throughout the duration of this evaluation. The patient reports " experiencing emotional pain" Pt. denies any suicidal ideation, plan or intent. Pt. denies the presence of any auditory or visual hallucinations at this time. Patient denies any other medical complaints.He report no issues with is group home or group home staff and compliance with medications.Clt state he has resided within this particular home for five years. He states that he felt unsafe on today, believing that he was involved in "spiritual warfare". He denies reduced sleep or appetite. Client was able to answer most question appropriately. He has contracted for safety outside of the facility but states that he feel confused.     CCA Screening, Triage and Referral (STR)  Patient Reported Information How did you hear about [place]? Mudlogger)   What Is the Reason for Your Visit/Call Today? Pt reports experiencing spiritual pain  How Long Has This Been Causing You Problems? <Week  What Do You Feel Would Help You the Most Today? Stress Management   Have You Recently Been in Any Inpatient Treatment (Hospital/Detox/Crisis Center/28-Day Program)? No data recorded Name/Location of Program/Hospital:No data  recorded How Long Were You There? No data recorded When Were You Discharged? No data recorded  Have You Ever Received Services From [facility] Before? No data recorded Who Do You See at [facility]? No data recorded  Have You Recently Had Any Thoughts About Hurting Yourself? No  Are You Planning to Commit Suicide/Harm Yourself At This time? No   Have you Recently Had Thoughts About Hurting Someone Karolee Ohs? No  Explanation: No data recorded  Have You Used Any Alcohol or Drugs in the Past 24 Hours? No  How Long Ago Did You Use Drugs or Alcohol? No data recorded What Did You Use and How Much? No data recorded  Do You Currently Have a Therapist/Psychiatrist? Yes  Name of Therapist/Psychiatrist: Group home   Have You Been Recently Discharged From Any Office Practice or Programs? No  Explanation of Discharge From Practice/Program: No data recorded    CCA Screening Triage Referral Assessment Type of Contact: Face-to-Face  Is this Initial or Reassessment? No data recorded Date Telepsych consult ordered in [facility]:  No data recorded Time Telepsych consult ordered in [facility]:  No data recorded  Patient Reported Information Reviewed? No data recorded Patient Left Without Being Seen? No data recorded Reason for Not Completing Assessment: No data recorded  Collateral Involvement: McKnight,Vince (Legal Guardian)   [phone]   Does Patient Have a Court Appointed Legal Guardian? No data recorded Name and Contact of Legal Guardian: No data recorded If Minor and Not Living with Parent(s), Who has Custody? n/a  Is CPS involved or ever been involved? Never  Is APS involved or ever been involved? Never   Patient Determined To Be At Risk  for Harm To Self or Others Based on Review of Patient Reported Information or Presenting Complaint? No  Method: No data recorded Availability of Means: No data recorded Intent: No data recorded Notification Required: No data recorded Additional  Information for Danger to Others Potential: No data recorded Additional Comments for Danger to Others Potential: No data recorded Are There Guns or Other Weapons in Your Home? No data recorded Types of Guns/Weapons: No data recorded Are These Weapons Safely Secured?                            No data recorded Who Could Verify You Are Able To Have These Secured: No data recorded Do You Have any Outstanding Charges, Pending Court Dates, Parole/Probation? No data recorded Contacted To Inform of Risk of Harm To Self or Others: No data recorded  Location of Assessment: [facility] ED   Does Patient Present under Involuntary Commitment? Yes  IVC Papers Initial File Date: 02/09/22   [place] of Residence: [place]   Patient Currently Receiving the Following Services: Medication Management; Group Home   Determination of Need: Emergent (2 hours)   Options For Referral: Other: Comment (Pt is psych cleared and can be discharged.)     CCA Biopsychosocial Intake/Chief Complaint:  No data recorded Current Symptoms/Problems: No data recorded  Patient Reported Schizophrenia/Schizoaffective Diagnosis in Past: Yes   Strengths: Pt has stable housing, a supportive family, and is able to ask for help.  Preferences: No data recorded Abilities: No data recorded  Type of Services Patient Feels are Needed: No data recorded  Initial Clinical Notes/Concerns: No data recorded  Mental Health Symptoms Depression:   Hopelessness; Difficulty Concentrating   Duration of Depressive symptoms:  Less than two weeks   Mania:   None   Anxiety:    Difficulty concentrating; Worrying; Tension   Psychosis:   None   Duration of Psychotic symptoms:  N/A   Trauma:   N/A   Obsessions:   Attempts to suppress/neutralize; Recurrent & persistent thoughts/impulses/images   Compulsions:   None   Inattention:   None   Hyperactivity/Impulsivity:   None   Oppositional/Defiant Behaviors:   None    Emotional Irregularity:   None   Other Mood/Personality Symptoms:  No data recorded   Mental Status Exam Appearance and self-care  Stature:   Average   Weight:   Average weight   Clothing:   -- (In scrubs)   Grooming:   Normal   Cosmetic use:   None   Posture/gait:   Normal   Motor activity:   Tremor   Sensorium  Attention:   Persistent   Concentration:   Anxiety interferes; Focuses on irrelevancies   Orientation:   X5   Recall/memory:   Normal   Affect and Mood  Affect:   Anxious   Mood:   Anxious   Relating  Eye contact:   Fleeting   Facial expression:   Anxious   Attitude toward examiner:   Cooperative   Thought and Language  Speech flow:  Blocked   Thought content:   Appropriate to Mood and Circumstances   Preoccupation:   None   Hallucinations:   None   Organization:  No data recorded  [employer] of Knowledge:   Fair   Intelligence:   Average   Abstraction:   Functional   Judgement:   Fair   Reality Testing:   Variable   Insight:   Lacking  Decision Making:   Only simple   Social Functioning  Social Maturity:   Responsible   Social Judgement:   Normal   Stress  Stressors:   Other (Comment)   Coping Ability:   Overwhelmed   Skill Deficits:   Decision making; Self-control   Supports:   Friends/Service system; Family     Religion: Religion/Spirituality Are You A Religious Person?: Yes What is Your Religious Affiliation?: Christian  Leisure/Recreation: Leisure / Recreation Do You Have Hobbies?: No  Exercise/Diet: Exercise/Diet Do You Exercise?: No Have You Gained or Lost A Significant Amount of Weight in the Past Six Months?: No Do You Follow a Special Diet?: No Do You Have Any Trouble Sleeping?: Yes Explanation of Sleeping Difficulties: Nightmares   CCA Employment/Education Employment/Work Situation: Employment / Work Situation Employment Situation: On  disability Why is Patient on Disability: Mental Health How Long has Patient Been on Disability: since 36 y/o Patient's Job has Been Impacted by Current Illness: No Has Patient ever Been in the [employer]?: No  Education: Education Is Patient Currently Attending School?: No Did Theme park manager?: No Did You Have An Individualized Education Program (IIEP): No Did You Have Any Difficulty At School?: No Patient's Education Has Been Impacted by Current Illness: No   CCA Family/Childhood History Family and Relationship History:    Childhood History:     Child/Adolescent Assessment:     CCA Substance Use Alcohol/Drug Use: Alcohol / Drug Use Pain Medications: See PTA Prescriptions: See PTA Over the Counter: See PTA History of alcohol / drug use?: No history of alcohol / drug abuse Longest period of sobriety (when/how long): n/a Negative Consequences of Use: Personal relationships Withdrawal Symptoms: Other (Comment)                         ASAM's:  Six Dimensions of Multidimensional Assessment  Dimension 1:  Acute Intoxication and/or Withdrawal Potential:      Dimension 2:  Biomedical Conditions and Complications:      Dimension 3:  Emotional, Behavioral, or Cognitive Conditions and Complications:     Dimension 4:  Readiness to Change:     Dimension 5:  Relapse, Continued use, or Continued Problem Potential:     Dimension 6:  Recovery/Living Environment:     ASAM Severity Score:    ASAM Recommended Level of Treatment:     Substance use Disorder (SUD)    Recommendations for Services/Supports/Treatments:    DSM5 Diagnoses: Patient Active Problem List   Diagnosis Date Noted   Schizoaffective disorder (HCC) 11/22/2021   Schizophrenia (HCC) 01/13/2019   Schizoaffective disorder, bipolar type (HCC) 02/09/2018   Hypertension 02/09/2018    Patient Centered Plan: Patient is on the following Treatment Plan(s):  Anxiety and Impulse Control   Referrals to  Alternative Service(s): Referred to Alternative Service(s):   Place:   Date:   Time:    Referred to Alternative Service(s):   Place:   Date:   Time:    Referred to Alternative Service(s):   Place:   Date:   Time:    Referred to Alternative Service(s):   Place:   Date:   Time:      @BHCOLLABOFCARE @  

## 2022-02-10 NOTE — Consult Note (Addendum)
[facility] Consult   Reason for Consult:  psychiatric evaluation Referring Physician:  edp Patient Identification: Jonathan Alexander. MRN:  [id number] Principal Diagnosis: <principal problem not specified> Diagnosis:  Active Problems:   Schizoaffective disorder, bipolar type (Jonathan Alexander)   Total Time spent with patient: 45 minutes  Subjective:     HPI: Jonathan Alexander., 36 y.o., male patient seen  by this provider; chart reviewed and consulted with Dr. Dwyane Alexander on 02/10/22.  On evaluation [name]. reports  that he has been having trouble controlling his thoughts. Per triage note, pt presents to ER from an adult home brought in by [facility], pt reports he is confused he has been taking things too hard. Per Jonathan Alexander pt called 911 saying he wants to hurt himself. Per deputy he talked to staff at group home and was informed he recently had some medication adjustments Jonathan Alexander [phone]. ) Pt denies any SI or HI at present denies any visual or auditory hallucinations.  Pt says" I might  be hearing stuff, may be is nothing"  Per deputy to call C-com when pt is ready to be discharged so someone can take him back to [facility]. Per chart review, patient recently undergone ect.    During evaluation Jonathan Alexander. is laying in bed; he is alert/oriented x 2; appears to be preoccupied .  He remains calm/cooperative; but apparent he was not engaged in assessment. Most questions asked were not answered and was met with a blank stare.  His mood is somber with flat and blunted affect.  His thought process is incoherent.    Patient has denied suicidal/self-harm and homicidal ideations with this provider.    Recommendation:  Reassess in the AM.   Past Psychiatric History: schizoaffective disorder  Risk to Self:   Risk to Others:   Prior Inpatient Therapy:   Prior Outpatient Therapy:    Past Medical History:  Past Medical History:  Diagnosis Date    Schizophrenia ([place])    Tachycardia    History reviewed. No pertinent surgical history. Family History:  Family History  Family history unknown: Yes   Family Psychiatric  History: unknowm Social History:  Social History   Substance and Sexual Activity  Alcohol Use No     Social History   Substance and Sexual Activity  Drug Use No    Social History   Socioeconomic History   Marital status: Single    Spouse name: Not on file   Number of children: Not on file   Years of education: Not on file   Highest education level: Not on file  Occupational History   Not on file  Tobacco Use   Smoking status: Every Day    Packs/day: 1.00    Years: 4.00    Total pack years: 4.00    Types: Cigarettes   Smokeless tobacco: Never  Substance and Sexual Activity   Alcohol use: No   Drug use: No   Sexual activity: Never  Other Topics Concern   Not on file  Social History Narrative   Not on file   Social Determinants of Health   Financial Resource Strain: Not on file  Food Insecurity: Not on file  Transportation Needs: Not on file  Physical Activity: Not on file  Stress: Not on file  Social Connections: Not on file   Additional Social History:    Allergies:   Allergies  Allergen Reactions   Divalproex Sodium    Shellfish-Derived Products  Labs: No results found for this or any previous visit (from the past 48 hour(s)).  No current facility-administered medications for this encounter.   Current Outpatient Medications  Medication Sig Dispense Refill   albuterol (VENTOLIN HFA) 108 (90 Base) MCG/ACT inhaler Inhale 2 puffs into the lungs every 6 (six) hours as needed for wheezing or shortness of breath. 8 g 0   atorvastatin (LIPITOR) 10 MG tablet Take 1 tablet (10 mg total) by mouth at bedtime. 30 tablet 0   clonazePAM (KLONOPIN) 0.5 MG tablet Take 1 tablet (0.5 mg total) by mouth 3 (three) times daily. 90 tablet 0   clozapine (CLOZARIL) 200 MG tablet Take 2 tablets (400  mg total) by mouth at bedtime. 60 tablet 0   desmopressin (DDAVP) 0.2 MG tablet Take 1 tablet (0.2 mg total) by mouth at bedtime. 30 tablet 0   EPINEPHrine 0.3 mg/0.3 mL IJ SOAJ injection Inject 0.3 mg into the muscle as needed for anaphylaxis. 1 each 0   ferrous sulfate 325 (65 FE) MG tablet Take 1 tablet (325 mg total) by mouth daily with breakfast. 30 tablet 0   imipramine (TOFRANIL) 50 MG tablet Take 2 tablets (100 mg total) by mouth at bedtime. 60 tablet 0   ketotifen (ZADITOR) 0.025 % ophthalmic solution Place 2 drops into the left eye every 4 (four) hours as needed (for irritation of left eye). 5 mL 0   lithium carbonate (ESKALITH) 450 MG CR tablet Take 1 tablet (450 mg total) by mouth every 12 (twelve) hours. 60 tablet 0   loratadine (CLARITIN) 10 MG tablet Take 1 tablet (10 mg total) by mouth daily. 30 tablet 0   metFORMIN (GLUCOPHAGE) 500 MG tablet Take 1 tablet (500 mg total) by mouth 2 (two) times daily with a meal. 60 tablet 0   metoprolol succinate (TOPROL-XL) 100 MG 24 hr tablet Take 1 tablet (100 mg total) by mouth daily. 30 tablet 0   OLANZapine (ZYPREXA) 20 MG tablet Take 1 tablet (20 mg total) by mouth at bedtime. 30 tablet 0   venlafaxine XR (EFFEXOR-XR) 75 MG 24 hr capsule Take 1 capsule (75 mg total) by mouth daily with breakfast. 30 capsule 0    Musculoskeletal: Strength & Muscle Tone: within normal limits Gait & Station: normal Patient leans: N/A            Psychiatric Specialty Exam:  Presentation  General Appearance: Bizarre  Eye Contact:Fleeting  Speech:Blocked  Speech Volume:Decreased  Handedness:Right   Mood and Affect  Mood:Dysphoric  Affect:Flat; Restricted   Thought Process  Thought Processes:Coherent  Descriptions of Associations:Loose  Orientation:Partial  Thought Content:Logical; WDL  History of Schizophrenia/Schizoaffective disorder:Yes  Duration of Psychotic Symptoms:N/A  Hallucinations:Hallucinations: None  Ideas of  Reference:None  Suicidal Thoughts:Suicidal Thoughts: No  Homicidal Thoughts:Homicidal Thoughts: No   Sensorium  Memory:Other (comment)  Judgment:Impaired  Insight:Lacking   Executive Functions  Concentration:Poor  Attention Span:Poor  Recall:Poor  Fund of Knowledge:Poor  Language:Fair   Psychomotor Activity  Psychomotor Activity:No data recorded  Assets  Assets:Communication Skills; Housing   Sleep  Sleep:Sleep: Fair   Physical Exam: Physical Exam ROS Blood pressure 133/90, pulse 98, temperature 98 F (36.7 C), temperature source Oral, resp. rate 16, height 5' 11"  (1.803 m), weight 95.7 kg, SpO2 100 %. Body mass index is 29.43 kg/m.  Disposition:  Reassess in the AM  Deloria Lair, NP 02/10/2022 3:10 AM

## 2022-02-10 NOTE — ED Notes (Signed)
Pt given blue scrubs to wear for discharge.

## 2022-02-10 NOTE — ED Notes (Signed)
Pt given breakfast tray

## 2022-02-10 NOTE — ED Notes (Signed)
Pt discharged back to group home in care of group home staff.  VS stable.  1 belonging bag returned to patient. Pt denies SI/HI.

## 2022-02-10 NOTE — ED Notes (Addendum)
Jethro Bolus called wanting an update on the patient's status. RN explained the pt needs to be re evaluated this morning.  Ms. Aundria Rud then stated the patient's outpatient provider has changed his Klonopin to where he only takes it at bedtime.  Ms. Aundria Rud states this has caused the patient to have difficulties (calling 911).  The patient had been taking Klonopin 3x's a day.   [phone]

## 2022-02-10 NOTE — ED Notes (Signed)
RN spoke with pt's legal guardian Jonathan Alexander) about patient discharging back to the group home.  Verbal consent given my Mr. Jonathan Alexander.

## 2022-02-10 NOTE — ED Notes (Signed)
Pt out of shower back in bed

## 2022-02-10 NOTE — ED Notes (Signed)
pt given lunch tray 

## 2022-02-10 NOTE — BH Assessment (Signed)
Psych Team met with patient for reassessment. Patient denies current SI/HI/AH/VH. Patient presented calm and cooperative. Spoke with group home owner, Skeet Simmer who states since adjustments to patient's meds, patient's behavior has been "off". Provider will restart meds and patient will return to his group home.   Per Roselyn Reef, NP, patient does not meet criteria for inpatient psychiatric admission.

## 2022-02-10 NOTE — ED Notes (Signed)
VOL/pending reassessment in the AM 

## 2022-03-10 ENCOUNTER — Other Ambulatory Visit: Payer: Self-pay | Admitting: Psychiatry

## 2022-03-11 ENCOUNTER — Encounter: Payer: Self-pay | Admitting: Certified Registered Nurse Anesthetist

## 2022-03-11 ENCOUNTER — Encounter
Admission: RE | Admit: 2022-03-11 | Discharge: 2022-03-11 | Disposition: A | Discharge status: Home / Self Care | Payer: 59 | Source: Ambulatory Visit | Attending: Psychiatry | Admitting: Psychiatry

## 2022-03-11 DIAGNOSIS — F259 Schizoaffective disorder, unspecified: Secondary | ICD-10-CM | POA: Insufficient documentation

## 2022-03-11 DIAGNOSIS — F17210 Nicotine dependence, cigarettes, uncomplicated: Secondary | ICD-10-CM | POA: Diagnosis not present

## 2022-03-11 DIAGNOSIS — R000 Tachycardia, unspecified: Secondary | ICD-10-CM | POA: Insufficient documentation

## 2022-03-11 DIAGNOSIS — F250 Schizoaffective disorder, bipolar type: Secondary | ICD-10-CM | POA: Diagnosis not present

## 2022-03-11 MED ORDER — KETAMINE HCL 10 MG/ML IJ SOLN
INTRAMUSCULAR | Status: DC | PRN
  Administered 2022-03-11: 80 mg via INTRAVENOUS

## 2022-03-11 MED ORDER — KETAMINE HCL 50 MG/5ML IJ SOSY
PREFILLED_SYRINGE | INTRAMUSCULAR | Status: AC
  Filled 2022-03-11: qty 5

## 2022-03-11 MED ORDER — MIDAZOLAM HCL 2 MG/2ML IJ SOLN
INTRAMUSCULAR | Status: DC | PRN
  Administered 2022-03-11: 2 mg via INTRAVENOUS

## 2022-03-11 MED ORDER — METHOHEXITAL SODIUM 100 MG/10ML IV SOSY
PREFILLED_SYRINGE | INTRAVENOUS | Status: DC | PRN
  Administered 2022-03-11: 70 mg via INTRAVENOUS

## 2022-03-11 MED ORDER — SUCCINYLCHOLINE CHLORIDE 200 MG/10ML IV SOSY
PREFILLED_SYRINGE | INTRAVENOUS | Status: AC
  Filled 2022-03-11: qty 10

## 2022-03-11 MED ORDER — SODIUM CHLORIDE 0.9 % IV SOLN: INTRAVENOUS | Status: DC | PRN

## 2022-03-11 MED ORDER — LABETALOL HCL 5 MG/ML IV SOLN
INTRAVENOUS | Status: AC
  Filled 2022-03-11: qty 4

## 2022-03-11 MED ORDER — SUCCINYLCHOLINE CHLORIDE 200 MG/10ML IV SOSY
PREFILLED_SYRINGE | INTRAVENOUS | Status: DC | PRN
  Administered 2022-03-11: 100 mg via INTRAVENOUS
  Administered 2022-03-11: 50 mg via INTRAVENOUS

## 2022-03-11 MED ORDER — MIDAZOLAM HCL 2 MG/2ML IJ SOLN
INTRAMUSCULAR | Status: AC
  Filled 2022-03-11: qty 2

## 2022-03-11 NOTE — Anesthesia Postprocedure Evaluation (Signed)
Anesthesia Post Note  Patient: Jonathan Alexander.  Procedure(s) Performed: ECT TX  Patient location during evaluation: PACU Anesthesia Type: General Level of consciousness: awake and alert Pain management: pain level controlled Vital Signs Assessment: post-procedure vital signs reviewed and stable Respiratory status: spontaneous breathing, nonlabored ventilation, respiratory function stable and patient connected to nasal cannula oxygen Cardiovascular status: blood pressure returned to baseline and stable Postop Assessment: no apparent nausea or vomiting Anesthetic complications: no Comments: Patient had an apneic episode upon arrival to PACU the required mask ventilation, this resolved with jaw thrust   No notable events documented.   Last Vitals:  Vitals:   03/11/22 1416 03/11/22 1430  BP: 117/73 121/64  Pulse: (!) 103 98  Resp: (!) 22 16  Temp:  (!) 36.4 C  SpO2: 100% 100%    Last Pain:  Vitals:   03/11/22 1430  TempSrc:   PainSc: 0-No pain                 Cleda Mccreedy Dang Mathison

## 2022-03-11 NOTE — Transfer of Care (Signed)
Immediate Anesthesia Transfer of Care Note  Patient: Jonathan Alexander.  Procedure(s) Performed: ECT TX  Patient Location: PACU  Anesthesia Type:General  Level of Consciousness: awake and alert   Airway & Oxygen Therapy: Patient Spontanous Breathing and Patient connected to face mask oxygen  Post-op Assessment: Report given to RN and Post -op Vital signs reviewed and stable  Post vital signs: Reviewed and stable  Last Vitals:  Vitals Value Taken Time  BP 118/82 03/11/22 1356  Temp    Pulse 106 03/11/22 1356  Resp 17 03/11/22 1350  SpO2 100 % 03/11/22 1356  Vitals shown include unvalidated device data.  Last Pain:  Vitals:   03/11/22 1302  TempSrc:   PainSc: 0-No pain         Complications: No notable events documented.

## 2022-03-11 NOTE — Anesthesia Procedure Notes (Signed)
Date/Time: 03/11/2022 1:30 PM  Performed by: Ginger Carne, CRNAPre-anesthesia Checklist: Patient identified, Emergency Drugs available, Suction available, Patient being monitored and Timeout performed Patient Re-evaluated:Patient Re-evaluated prior to induction Oxygen Delivery Method: Circle system utilized Preoxygenation: Pre-oxygenation with 100% oxygen Induction Type: IV induction

## 2022-03-11 NOTE — Anesthesia Preprocedure Evaluation (Signed)
Anesthesia Evaluation  Patient identified by MRN, date of birth, ID band Patient awake    Reviewed: Allergy & Precautions, NPO status , Patient's Chart, lab work & pertinent test results  History of Anesthesia Complications Negative for: history of anesthetic complications  Airway Mallampati: III   Neck ROM: Full    Dental  (+)    Pulmonary Current Smoker and Patient abstained from smoking.,    Pulmonary exam normal breath sounds clear to auscultation       Cardiovascular hypertension, Normal cardiovascular exam Rhythm:Regular Rate:Normal  ECG 12/05/20:  Sinus tachycardia Nonspecific T wave abnormality   Neuro/Psych PSYCHIATRIC DISORDERS Bipolar Disorder Schizophrenia negative neurological ROS     GI/Hepatic negative GI ROS,   Endo/Other  Obesity; prediabetes  Renal/GU negative Renal ROS     Musculoskeletal   Abdominal   Peds  Hematology negative hematology ROS (+)   Anesthesia Other Findings   Reproductive/Obstetrics                             Anesthesia Physical  Anesthesia Plan  ASA: 2  Anesthesia Plan: General   Post-op Pain Management:    Induction: Intravenous  PONV Risk Score and Plan: 1 and TIVA and Treatment may vary due to age or medical condition  Airway Management Planned: Natural Airway  Additional Equipment:   Intra-op Plan:   Post-operative Plan:   Informed Consent: I have reviewed the patients History and Physical, chart, labs and discussed the procedure including the risks, benefits and alternatives for the proposed anesthesia with the patient or authorized representative who has indicated his/her understanding and acceptance.       Plan Discussed with: CRNA  Anesthesia Plan Comments: (Serial consent on chart.  LMA/GETA backup discussed.  Patient consented for risks of anesthesia including but not limited to:  - adverse reactions to medications -  damage to eyes, teeth, lips or other oral mucosa - nerve damage due to positioning  - sore throat or hoarseness - damage to heart, brain, nerves, lungs, other parts of body or loss of life  Informed patient about role of CRNA in peri- and intra-operative care.  Patient voiced understanding.)        Anesthesia Quick Evaluation

## 2022-03-11 NOTE — H&P (Signed)
Jonathan Alexander. is an 36 y.o. male.   Chief Complaint: Patient has no chief complaint reports his mood is good HPI: Schizoaffective disorder  Past Medical History:  Diagnosis Date   Schizophrenia (HCC)    Tachycardia     History reviewed. No pertinent surgical history.  Family History  Family history unknown: Yes   Social History:  reports that he has been smoking cigarettes. He has a 4.00 pack-year smoking history. He has never used smokeless tobacco. He reports that he does not drink alcohol and does not use drugs.  Allergies:  Allergies  Allergen Reactions   Divalproex Sodium    Shellfish-Derived Products     (Not in a hospital admission)   No results found for this or any previous visit (from the past 48 hour(s)). No results found.  Review of Systems  Constitutional: Negative.   HENT: Negative.    Eyes: Negative.   Respiratory: Negative.    Cardiovascular: Negative.   Gastrointestinal: Negative.   Musculoskeletal: Negative.   Skin: Negative.   Neurological: Negative.   Psychiatric/Behavioral: Negative.    All other systems reviewed and are negative.   Blood pressure 120/62, pulse 95, temperature (!) 97.5 F (36.4 C), temperature source Oral, resp. rate 18, height 5\' 9"  (1.753 m), weight 99.2 kg, SpO2 100 %. Physical Exam Vitals and nursing note reviewed.  Constitutional:      Appearance: He is well-developed.  HENT:     Head: Normocephalic and atraumatic.  Eyes:     Conjunctiva/sclera: Conjunctivae normal.     Pupils: Pupils are equal, round, and reactive to light.  Cardiovascular:     Heart sounds: Normal heart sounds.  Pulmonary:     Effort: Pulmonary effort is normal.  Abdominal:     Palpations: Abdomen is soft.  Musculoskeletal:        General: Normal range of motion.     Cervical back: Normal range of motion.  Skin:    General: Skin is warm and dry.  Neurological:     General: No focal deficit present.     Mental Status: He is alert.   Psychiatric:        Mood and Affect: Mood normal.      Assessment/Plan Continue ECT as part of maintenance program  , MD 03/11/2022, 6:22 PM

## 2022-03-11 NOTE — Progress Notes (Signed)
Education provided to the patient regarding the possibility of incontinence due to the administration of anesthesia. Recommendation was made for the patient to apply an incontinence pad to prevent soiling clothing. Patient verbalized understanding of the education and stated they choose not to apply and incontinence pad for this visit.   

## 2022-03-11 NOTE — Procedures (Signed)
ECT SERVICES Physician's Interval Evaluation & Treatment Note  Patient Identification: Jonathan Alexander. MRN:  [id number] Date of Evaluation:  03/11/2022 TX #: 64  MADRS:   MMSE:   P.E. Findings:  No change.  Psychiatric Interval Note:  Doing well  Subjective:  Patient is a 36 y.o. male seen for evaluation for Electroconvulsive Therapy. No complaints  Treatment Summary:   []   Right Unilateral             [x]  Bilateral   % Energy : 1.0 ms 60%   Impedance: 1440 ohms  Seizure Energy Index: 7728 V squared  Postictal Suppression Index: No reading  Seizure Concordance Index: 95%  Medications  Pre Shock: Robinul 0.2 mg ketamine 80 mg Brevital 70 mg succinylcholine 100 mg  Post Shock: Versed 2 mg  Seizure Duration: 30 seconds EMG 34 seconds EEG   Comments: Follow-up 6 weeks  Lungs:  [x]   Clear to auscultation               []  Other:   Heart:    [x]   Regular rhythm             []  irregular rhythm    [x]   Previous H&P reviewed, patient examined and there are NO CHANGES                 []   Previous H&P reviewed, patient examined and there are changes noted.   , MD 8/7/20236:26 PM

## 2022-04-16 ENCOUNTER — Other Ambulatory Visit (HOSPITAL_COMMUNITY): Payer: Self-pay | Admitting: Psychiatry

## 2022-04-19 ENCOUNTER — Other Ambulatory Visit: Payer: Self-pay | Admitting: Psychiatry

## 2022-04-22 ENCOUNTER — Encounter: Payer: Self-pay | Admitting: Anesthesiology

## 2022-04-22 ENCOUNTER — Ambulatory Visit
Admission: RE | Admit: 2022-04-22 | Discharge: 2022-04-22 | Disposition: A | Discharge status: Home / Self Care | Payer: 59 | Source: Ambulatory Visit | Attending: Psychiatry | Admitting: Psychiatry

## 2022-04-22 DIAGNOSIS — F17210 Nicotine dependence, cigarettes, uncomplicated: Secondary | ICD-10-CM | POA: Diagnosis not present

## 2022-04-22 DIAGNOSIS — F259 Schizoaffective disorder, unspecified: Secondary | ICD-10-CM | POA: Insufficient documentation

## 2022-04-22 DIAGNOSIS — F319 Bipolar disorder, unspecified: Secondary | ICD-10-CM | POA: Insufficient documentation

## 2022-04-22 DIAGNOSIS — F250 Schizoaffective disorder, bipolar type: Secondary | ICD-10-CM | POA: Diagnosis not present

## 2022-04-22 MED ORDER — FENTANYL CITRATE (PF) 100 MCG/2ML IJ SOLN: 25.0000 ug | INTRAMUSCULAR | Status: DC | PRN

## 2022-04-22 MED ORDER — KETAMINE HCL 50 MG/ML IJ SOLN
INTRAMUSCULAR | Status: AC
  Filled 2022-04-22: qty 1

## 2022-04-22 MED ORDER — SUCCINYLCHOLINE CHLORIDE 200 MG/10ML IV SOSY
PREFILLED_SYRINGE | INTRAVENOUS | Status: DC | PRN
  Administered 2022-04-22: 150 mg via INTRAVENOUS

## 2022-04-22 MED ORDER — METHOHEXITAL SODIUM 100 MG/10ML IV SOSY
PREFILLED_SYRINGE | INTRAVENOUS | Status: DC | PRN
  Administered 2022-04-22: 70 mg via INTRAVENOUS

## 2022-04-22 MED ORDER — KETAMINE HCL 10 MG/ML IJ SOLN
INTRAMUSCULAR | Status: DC | PRN
  Administered 2022-04-22: 80 mg via INTRAVENOUS

## 2022-04-22 MED ORDER — MIDAZOLAM HCL 2 MG/2ML IJ SOLN
2.0000 mg | Freq: Once | INTRAMUSCULAR | Status: AC
  Administered 2022-04-22: 2 mg via INTRAVENOUS

## 2022-04-22 MED ORDER — SODIUM CHLORIDE 0.9 % IV SOLN: 500.0000 mL | Freq: Once | INTRAVENOUS | Status: AC

## 2022-04-22 MED ORDER — ONDANSETRON HCL 4 MG/2ML IJ SOLN: 4.0000 mg | Freq: Once | INTRAMUSCULAR | Status: DC | PRN

## 2022-04-22 MED ORDER — GLYCOPYRROLATE 0.2 MG/ML IJ SOLN: 0.3000 mg | Freq: Once | INTRAMUSCULAR | Status: DC

## 2022-04-22 MED ORDER — SUCCINYLCHOLINE CHLORIDE 200 MG/10ML IV SOSY
PREFILLED_SYRINGE | INTRAVENOUS | Status: AC
  Filled 2022-04-22: qty 10

## 2022-04-22 MED ORDER — PROPOFOL 10 MG/ML IV BOLUS
INTRAVENOUS | Status: AC
  Filled 2022-04-22: qty 20

## 2022-04-22 NOTE — Anesthesia Postprocedure Evaluation (Signed)
Anesthesia Post Note  Patient: Jonathan Alexander.  Procedure(s) Performed: ECT TX  Patient location during evaluation: PACU Anesthesia Type: General Level of consciousness: awake and oriented Pain management: pain level controlled Vital Signs Assessment: post-procedure vital signs reviewed and stable Respiratory status: spontaneous breathing and respiratory function stable Cardiovascular status: stable Anesthetic complications: no   No notable events documented.   Last Vitals:  Vitals:   04/22/22 1130 04/22/22 1339  BP: 136/80 116/69  Pulse: (!) 109 (!) 110  Resp: 18 17  Temp: 36.8 C 36.6 C  SpO2: 98% 98%    Last Pain:  Vitals:   04/22/22 1339  TempSrc:   PainSc: Asleep                 VAN STAVEREN,Darchelle Nunes

## 2022-04-22 NOTE — H&P (Signed)
Jonathan Alexander. is an 36 y.o. male.   Chief Complaint: No complaints.  He denies any psychiatric symptoms HPI: Schizoaffective disorder  Past Medical History:  Diagnosis Date   Schizophrenia ([place])    Tachycardia     History reviewed. No pertinent surgical history.  Family History  Family history unknown: Yes   Social History:  reports that he has been smoking cigarettes. He has a 4.00 pack-year smoking history. He has never used smokeless tobacco. He reports that he does not drink alcohol and does not use drugs.  Allergies:  Allergies  Allergen Reactions   Divalproex Sodium    Shellfish-Derived Products     (Not in a hospital admission)   No results found for this or any previous visit (from the past 48 hour(s)). No results found.  Review of Systems  Constitutional: Negative.   HENT: Negative.    Eyes: Negative.   Respiratory: Negative.    Cardiovascular: Negative.   Gastrointestinal: Negative.   Musculoskeletal: Negative.   Skin: Negative.   Neurological: Negative.   Psychiatric/Behavioral: Negative.      Blood pressure 118/78, pulse (!) 108, temperature 97.8 F (36.6 C), resp. rate (!) 21, height 5\' 6"  (1.676 m), weight 101.2 kg, SpO2 100 %. Physical Exam Vitals and nursing note reviewed.  Constitutional:      Appearance: He is well-developed.  HENT:     Head: Normocephalic and atraumatic.  Eyes:     Conjunctiva/sclera: Conjunctivae normal.     Pupils: Pupils are equal, round, and reactive to light.  Cardiovascular:     Heart sounds: Normal heart sounds.  Pulmonary:     Effort: Pulmonary effort is normal.  Abdominal:     Palpations: Abdomen is soft.  Musculoskeletal:        General: Normal range of motion.     Cervical back: Normal range of motion.  Skin:    General: Skin is warm and dry.  Neurological:     Mental Status: He is alert.  Psychiatric:        Mood and Affect: Mood normal.      Assessment/Plan Maintenance ECT doing well  great.  Currently remains stable.  Treat today back in 6 weeks  Alethia Berthold, MD 04/22/2022, 2:44 PM

## 2022-04-22 NOTE — Transfer of Care (Signed)
Immediate Anesthesia Transfer of Care Note  Patient: Jonathan Alexander.  Procedure(s) Performed: ECT TX  Patient Location: PACU  Anesthesia Type:General  Level of Consciousness: drowsy  Airway & Oxygen Therapy: Patient Spontanous Breathing and Patient connected to face mask oxygen  Post-op Assessment: Report given to RN and Post -op Vital signs reviewed and stable  Post vital signs: Reviewed and stable  Last Vitals:  Vitals Value Taken Time  BP 116/69 04/22/22 1339  Temp 36.6 C 04/22/22 1339  Pulse 107 04/22/22 1344  Resp 22 04/22/22 1344  SpO2 100 % 04/22/22 1344  Vitals shown include unvalidated device data.  Last Pain:  Vitals:   04/22/22 1339  TempSrc:   PainSc: Asleep         Complications: No notable events documented.

## 2022-04-22 NOTE — Anesthesia Procedure Notes (Signed)
Date/Time: 04/22/2022 1:22 PM  Performed by: Nelda Marseille, CRNAPre-anesthesia Checklist: Patient identified, Emergency Drugs available, Suction available and Timeout performed Ventilation: Mask ventilation throughout procedure

## 2022-04-22 NOTE — Anesthesia Preprocedure Evaluation (Addendum)
Anesthesia Evaluation  Patient identified by MRN, date of birth, ID band Patient awake    Airway Mallampati: II       Dental  (+) Teeth Intact   Pulmonary neg pulmonary ROS, Current Smoker and Patient abstained from smoking.,    breath sounds clear to auscultation       Cardiovascular hypertension,  Rhythm:Regular     Neuro/Psych Bipolar Disorder Schizophrenia    GI/Hepatic negative GI ROS, Neg liver ROS,   Endo/Other  negative endocrine ROS  Renal/GU negative Renal ROS     Musculoskeletal   Abdominal Normal abdominal exam  (+)   Peds negative pediatric ROS (+)  Hematology negative hematology ROS (+)   Anesthesia Other Findings   Reproductive/Obstetrics                             Anesthesia Physical Anesthesia Plan  ASA: 2  Anesthesia Plan: General   Post-op Pain Management:    Induction: Intravenous  PONV Risk Score and Plan:   Airway Management Planned: Natural Airway and Nasal Cannula  Additional Equipment:   Intra-op Plan:   Post-operative Plan:   Informed Consent: I have reviewed the patients History and Physical, chart, labs and discussed the procedure including the risks, benefits and alternatives for the proposed anesthesia with the patient or authorized representative who has indicated his/her understanding and acceptance.     Dental Advisory Given  Plan Discussed with: Anesthesiologist, CRNA and Surgeon  Anesthesia Plan Comments: (Patient consented for risks of anesthesia including but not limited to:  - adverse reactions to medications - risk of airway placement if required - damage to eyes, teeth, lips or other oral mucosa - nerve damage due to positioning  - sore throat or hoarseness - Damage to heart, brain, nerves, lungs, other parts of body or loss of life  Patient voiced understanding.)       Anesthesia Quick Evaluation

## 2022-04-22 NOTE — Procedures (Signed)
ECT SERVICES Physician's Interval Evaluation & Treatment Note  Patient Identification: Jonathan Alexander. MRN:  [id number] Date of Evaluation:  04/22/2022 TX #: 65  MADRS:   MMSE:   P.E. Findings:  No change to physical exam  Psychiatric Interval Note:  Stable with no complaints no strange behavior  Subjective:  Patient is a 36 y.o. male seen for evaluation for Electroconvulsive Therapy. No complaints  Treatment Summary:   []   Right Unilateral             [x]  Bilateral   % Energy : 1.0 ms 60%   Impedance: 1860 ohms  Seizure Energy Index: 10,854 V squared  Postictal Suppression Index: 90%  Seizure Concordance Index: 93%  Medications  Pre Shock: Robinul 0.2 mg ketamine 80 mg Brevital 70 mg succinylcholine 100 mg  Post Shock: Versed 2 mg  Seizure Duration: 20 seconds EMG 20 seconds EEG   Comments: Follow-up 6 weeks  Lungs:  [x]   Clear to auscultation               []  Other:   Heart:    [x]   Regular rhythm             []  irregular rhythm    [x]   Previous H&P reviewed, patient examined and there are NO CHANGES                 []   Previous H&P reviewed, patient examined and there are changes noted.   Alethia Berthold, MD 9/18/20232:45 PM

## 2022-04-22 NOTE — Addendum Note (Signed)
Addendum  created 04/22/22 1420 by Nelda Marseille, CRNA   Flowsheet accepted, Intraprocedure Meds edited

## 2022-06-08 ENCOUNTER — Other Ambulatory Visit: Payer: Self-pay | Admitting: Psychiatry

## 2022-06-10 ENCOUNTER — Inpatient Hospital Stay: Admission: RE | Admit: 2022-06-10 | Payer: 59 | Source: Ambulatory Visit

## 2022-06-10 ENCOUNTER — Encounter: Payer: Self-pay | Admitting: Certified Registered Nurse Anesthetist

## 2022-06-10 MED ORDER — KETAMINE HCL 50 MG/5ML IJ SOSY
PREFILLED_SYRINGE | INTRAMUSCULAR | Status: AC
  Filled 2022-06-10: qty 5

## 2022-06-10 MED ORDER — SUCCINYLCHOLINE CHLORIDE 200 MG/10ML IV SOSY
PREFILLED_SYRINGE | INTRAVENOUS | Status: AC
  Filled 2022-06-10: qty 10

## 2022-06-10 MED ORDER — MIDAZOLAM HCL 2 MG/2ML IJ SOLN
INTRAMUSCULAR | Status: AC
  Filled 2022-06-10: qty 2

## 2022-06-10 MED ORDER — METHOHEXITAL SODIUM 0.5 G IJ SOLR
INTRAMUSCULAR | Status: AC
  Filled 2022-06-10: qty 500

## 2022-06-11 ENCOUNTER — Other Ambulatory Visit: Payer: Self-pay | Admitting: Psychiatry

## 2022-06-12 ENCOUNTER — Ambulatory Visit: Payer: Self-pay | Admitting: Certified Registered Nurse Anesthetist

## 2022-06-12 ENCOUNTER — Ambulatory Visit
Admission: RE | Admit: 2022-06-12 | Discharge: 2022-06-12 | Disposition: A | Discharge status: Home / Self Care | Payer: 59 | Source: Ambulatory Visit | Attending: Psychiatry | Admitting: Psychiatry

## 2022-06-12 ENCOUNTER — Encounter: Payer: Self-pay | Admitting: Certified Registered Nurse Anesthetist

## 2022-06-12 DIAGNOSIS — F17210 Nicotine dependence, cigarettes, uncomplicated: Secondary | ICD-10-CM | POA: Insufficient documentation

## 2022-06-12 DIAGNOSIS — I10 Essential (primary) hypertension: Secondary | ICD-10-CM | POA: Insufficient documentation

## 2022-06-12 DIAGNOSIS — F319 Bipolar disorder, unspecified: Secondary | ICD-10-CM | POA: Diagnosis not present

## 2022-06-12 DIAGNOSIS — F250 Schizoaffective disorder, bipolar type: Secondary | ICD-10-CM | POA: Diagnosis not present

## 2022-06-12 DIAGNOSIS — F259 Schizoaffective disorder, unspecified: Secondary | ICD-10-CM | POA: Insufficient documentation

## 2022-06-12 MED ORDER — KETAMINE HCL 50 MG/5ML IJ SOSY
PREFILLED_SYRINGE | INTRAMUSCULAR | Status: AC
  Filled 2022-06-12: qty 5

## 2022-06-12 MED ORDER — SODIUM CHLORIDE 0.9 % IV SOLN: INTRAVENOUS | Status: DC | PRN

## 2022-06-12 MED ORDER — SODIUM CHLORIDE 0.9 % IV SOLN
500.0000 mL | Freq: Once | INTRAVENOUS | Status: AC
  Administered 2022-06-12: 500 mL via INTRAVENOUS

## 2022-06-12 MED ORDER — GLYCOPYRROLATE 0.2 MG/ML IJ SOLN
INTRAMUSCULAR | Status: AC
  Filled 2022-06-12: qty 1

## 2022-06-12 MED ORDER — GLYCOPYRROLATE 0.2 MG/ML IJ SOLN
0.2000 mg | Freq: Once | INTRAMUSCULAR | Status: AC
  Administered 2022-06-12: 0.2 mg via INTRAVENOUS

## 2022-06-12 MED ORDER — KETAMINE HCL 10 MG/ML IJ SOLN
INTRAMUSCULAR | Status: DC | PRN
  Administered 2022-06-12: 70 mg via INTRAVENOUS

## 2022-06-12 MED ORDER — MIDAZOLAM HCL 2 MG/2ML IJ SOLN
INTRAMUSCULAR | Status: DC | PRN
  Administered 2022-06-12: 2 mg via INTRAVENOUS

## 2022-06-12 MED ORDER — MIDAZOLAM HCL 2 MG/2ML IJ SOLN: 2.0000 mg | Freq: Once | INTRAMUSCULAR | Status: DC

## 2022-06-12 MED ORDER — SUCCINYLCHOLINE CHLORIDE 200 MG/10ML IV SOSY
PREFILLED_SYRINGE | INTRAVENOUS | Status: AC
  Filled 2022-06-12: qty 10

## 2022-06-12 MED ORDER — METHOHEXITAL SODIUM 100 MG/10ML IV SOSY
PREFILLED_SYRINGE | INTRAVENOUS | Status: DC | PRN
  Administered 2022-06-12: 70 mg via INTRAVENOUS

## 2022-06-12 MED ORDER — LABETALOL HCL 5 MG/ML IV SOLN
INTRAVENOUS | Status: DC | PRN
  Administered 2022-06-12: 10 mg via INTRAVENOUS

## 2022-06-12 MED ORDER — SUCCINYLCHOLINE CHLORIDE 200 MG/10ML IV SOSY
PREFILLED_SYRINGE | INTRAVENOUS | Status: DC | PRN
  Administered 2022-06-12: 150 mg via INTRAVENOUS

## 2022-06-12 MED ORDER — MIDAZOLAM HCL 2 MG/2ML IJ SOLN
INTRAMUSCULAR | Status: AC
  Filled 2022-06-12: qty 2

## 2022-06-12 NOTE — Transfer of Care (Signed)
Immediate Anesthesia Transfer of Care Note  Patient: Jonathan Alexander.  Procedure(s) Performed: ECT TX  Patient Location: PACU  Anesthesia Type:General  Level of Consciousness: drowsy  Airway & Oxygen Therapy: Patient Spontanous Breathing  Post-op Assessment: Report given to RN and Post -op Vital signs reviewed and stable  Post vital signs: Reviewed and stable  Last Vitals:  Vitals Value Taken Time  BP 121/80 06/12/22 1309  Temp    Pulse 102 06/12/22 1310  Resp 27 06/12/22 1311  SpO2 92 % 06/12/22 1310  Vitals shown include unvalidated device data.  Last Pain:  Vitals:   06/12/22 1153  TempSrc:   PainSc: 0-No pain         Complications: No notable events documented.

## 2022-06-12 NOTE — Anesthesia Preprocedure Evaluation (Signed)
Anesthesia Evaluation  Patient identified by MRN, date of birth, ID band Patient awake    Airway Mallampati: II       Dental  (+) Teeth Intact   Pulmonary neg pulmonary ROS, Current Smoker and Patient abstained from smoking.   breath sounds clear to auscultation       Cardiovascular hypertension,  Rhythm:Regular     Neuro/Psych     Bipolar Disorder Schizophrenia     GI/Hepatic negative GI ROS, Neg liver ROS,,,  Endo/Other  negative endocrine ROS    Renal/GU negative Renal ROS     Musculoskeletal   Abdominal Normal abdominal exam  (+)   Peds negative pediatric ROS (+)  Hematology negative hematology ROS (+)   Anesthesia Other Findings   Reproductive/Obstetrics                              Anesthesia Physical Anesthesia Plan  ASA: 2  Anesthesia Plan: General   Post-op Pain Management:    Induction: Intravenous  PONV Risk Score and Plan:   Airway Management Planned: Natural Airway and Nasal Cannula  Additional Equipment:   Intra-op Plan:   Post-operative Plan:   Informed Consent: I have reviewed the patients History and Physical, chart, labs and discussed the procedure including the risks, benefits and alternatives for the proposed anesthesia with the patient or authorized representative who has indicated his/her understanding and acceptance.     Dental Advisory Given  Plan Discussed with: Anesthesiologist, CRNA and Surgeon  Anesthesia Plan Comments: (Patient consented for risks of anesthesia including but not limited to:  - adverse reactions to medications - risk of airway placement if required - damage to eyes, teeth, lips or other oral mucosa - nerve damage due to positioning  - sore throat or hoarseness - Damage to heart, brain, nerves, lungs, other parts of body or loss of life  Patient voiced understanding.)         Anesthesia Quick Evaluation

## 2022-06-12 NOTE — Anesthesia Procedure Notes (Signed)
Procedure Name: General with mask airway Date/Time: 06/12/2022 12:55 PM  Performed by: Hezzie Bump, CRNAPre-anesthesia Checklist: Patient identified, Emergency Drugs available, Suction available and Patient being monitored Patient Re-evaluated:Patient Re-evaluated prior to induction Oxygen Delivery Method: Circle system utilized Preoxygenation: Pre-oxygenation with 100% oxygen Induction Type: IV induction Ventilation: Mask ventilation without difficulty and Mask ventilation throughout procedure Airway Equipment and Method: Bite block Placement Confirmation: positive ETCO2 Dental Injury: Teeth and Oropharynx as per pre-operative assessment

## 2022-06-12 NOTE — Procedures (Signed)
ECT SERVICES Physician's Interval Evaluation & Treatment Note  Patient Identification: Jonathan Alexander. MRN:  [id number] Date of Evaluation:  06/12/2022 TX #: 66  MADRS:   MMSE:   P.E. Findings:  No change to physical exam  Psychiatric Interval Note:  No complaints affect euthymic.  Subjective:  Patient is a 36 y.o. male seen for evaluation for Electroconvulsive Therapy. No suicidal thoughts and no obvious psychosis  Treatment Summary:   []   Right Unilateral             [x]  Bilateral   % Energy : 1.0 ms 60%   Impedance: 1680 ohms  Seizure Energy Index: 12,824 V squared  Postictal Suppression Index: 81%  Seizure Concordance Index: 98%  Medications  Pre Shock: Robinul 0.2 mg ketamine 80 mg Brevital 70 mg succinylcholine 100 mg  Post Shock: Versed 2 mg  Seizure Duration: EMG 23 seconds EEG 35 seconds   Comments: Follow-up 6 weeks  Lungs:  [x]   Clear to auscultation               []  Other:   Heart:    [x]   Regular rhythm             []  irregular rhythm    [x]   Previous H&P reviewed, patient examined and there are NO CHANGES                 []   Previous H&P reviewed, patient examined and there are changes noted.   , MD 11/8/20234:30 PM

## 2022-06-12 NOTE — H&P (Signed)
Jonathan Alexander. is an 36 y.o. male.   Chief Complaint: No complaint.  No complaint of psychotic symptoms not agitated not depressed HPI: Schizoaffective disorder  Past Medical History:  Diagnosis Date   Schizophrenia (HCC)    Tachycardia     History reviewed. No pertinent surgical history.  Family History  Family history unknown: Yes   Social History:  reports that he has been smoking cigarettes. He has a 4.00 pack-year smoking history. He has never used smokeless tobacco. He reports that he does not drink alcohol and does not use drugs.  Allergies:  Allergies  Allergen Reactions   Divalproex Sodium    Shellfish-Derived Products     (Not in a hospital admission)   No results found for this or any previous visit (from the past 48 hour(s)). No results found.  Review of Systems  Constitutional: Negative.   HENT: Negative.    Eyes: Negative.   Respiratory: Negative.    Cardiovascular: Negative.   Gastrointestinal: Negative.   Musculoskeletal: Negative.   Skin: Negative.   Neurological: Negative.   Psychiatric/Behavioral: Negative.      Blood pressure 138/80, pulse 100, temperature 98.2 F (36.8 C), temperature source Oral, resp. rate 18, SpO2 100 %. Physical Exam Vitals and nursing note reviewed.  Constitutional:      Appearance: He is well-developed.  HENT:     Head: Normocephalic and atraumatic.  Eyes:     Conjunctiva/sclera: Conjunctivae normal.     Pupils: Pupils are equal, round, and reactive to light.  Cardiovascular:     Heart sounds: Normal heart sounds.  Pulmonary:     Effort: Pulmonary effort is normal.  Abdominal:     Palpations: Abdomen is soft.  Musculoskeletal:        General: Normal range of motion.     Cervical back: Normal range of motion.  Skin:    General: Skin is warm and dry.  Neurological:     General: No focal deficit present.     Mental Status: He is alert.  Psychiatric:        Mood and Affect: Mood normal.        Thought  Content: Thought content normal.      Assessment/Plan Treatment today follow-up 6 weeks  [name], MD 06/12/2022, 4:29 PM

## 2022-06-24 NOTE — Anesthesia Postprocedure Evaluation (Signed)
Anesthesia Post Note  Patient: Jonathan Alexander.  Procedure(s) Performed: ECT TX  Patient location during evaluation: PACU Anesthesia Type: General Level of consciousness: awake and alert Pain management: pain level controlled Vital Signs Assessment: post-procedure vital signs reviewed and stable Respiratory status: spontaneous breathing, nonlabored ventilation, respiratory function stable and patient connected to nasal cannula oxygen Cardiovascular status: blood pressure returned to baseline and stable Postop Assessment: no apparent nausea or vomiting Anesthetic complications: no   No notable events documented.   Last Vitals:  Vitals:   06/12/22 1330 06/12/22 1348  BP: 139/84 138/80  Pulse: (!) 103 100  Resp: 18 18  Temp:  36.8 C  SpO2: 100%     Last Pain:  Vitals:   06/12/22 1348  TempSrc: Oral  PainSc: 0-No pain                 Lenard Simmer

## 2022-07-11 ENCOUNTER — Other Ambulatory Visit: Payer: Self-pay | Admitting: Psychiatry

## 2022-07-12 ENCOUNTER — Encounter: Payer: Self-pay | Admitting: Anesthesiology

## 2022-07-12 ENCOUNTER — Ambulatory Visit: Payer: Self-pay | Admitting: Anesthesiology

## 2022-07-12 ENCOUNTER — Ambulatory Visit
Admission: RE | Admit: 2022-07-12 | Discharge: 2022-07-12 | Disposition: A | Discharge status: Home / Self Care | Payer: 59 | Source: Ambulatory Visit | Attending: Psychiatry | Admitting: Psychiatry

## 2022-07-12 DIAGNOSIS — F259 Schizoaffective disorder, unspecified: Secondary | ICD-10-CM | POA: Insufficient documentation

## 2022-07-12 DIAGNOSIS — F250 Schizoaffective disorder, bipolar type: Secondary | ICD-10-CM | POA: Diagnosis not present

## 2022-07-12 MED ORDER — KETOROLAC TROMETHAMINE 30 MG/ML IJ SOLN
30.0000 mg | Freq: Once | INTRAMUSCULAR | Status: AC
  Administered 2022-07-12: 30 mg via INTRAVENOUS

## 2022-07-12 MED ORDER — GLYCOPYRROLATE 0.2 MG/ML IJ SOLN
0.3000 mg | Freq: Once | INTRAMUSCULAR | Status: AC
  Administered 2022-07-12: 0.3 mg via INTRAVENOUS

## 2022-07-12 MED ORDER — KETAMINE HCL 50 MG/5ML IJ SOSY
PREFILLED_SYRINGE | INTRAMUSCULAR | Status: AC
  Filled 2022-07-12: qty 5

## 2022-07-12 MED ORDER — OXYCODONE HCL 5 MG PO TABS: 5.0000 mg | ORAL_TABLET | Freq: Once | ORAL | Status: DC | PRN

## 2022-07-12 MED ORDER — FENTANYL CITRATE (PF) 100 MCG/2ML IJ SOLN: 25.0000 ug | INTRAMUSCULAR | Status: DC | PRN

## 2022-07-12 MED ORDER — OXYCODONE HCL 5 MG/5ML PO SOLN: 5.0000 mg | Freq: Once | ORAL | Status: DC | PRN

## 2022-07-12 MED ORDER — SUCCINYLCHOLINE CHLORIDE 200 MG/10ML IV SOSY
PREFILLED_SYRINGE | INTRAVENOUS | Status: DC | PRN
  Administered 2022-07-12: 150 mg via INTRAVENOUS

## 2022-07-12 MED ORDER — KETAMINE HCL 10 MG/ML IJ SOLN
INTRAMUSCULAR | Status: DC | PRN
  Administered 2022-07-12: 80 mg via INTRAVENOUS

## 2022-07-12 MED ORDER — ACETAMINOPHEN 10 MG/ML IV SOLN: 1000.0000 mg | Freq: Once | INTRAVENOUS | Status: DC | PRN

## 2022-07-12 MED ORDER — GLYCOPYRROLATE 0.2 MG/ML IJ SOLN
INTRAMUSCULAR | Status: AC
  Filled 2022-07-12: qty 2

## 2022-07-12 MED ORDER — SODIUM CHLORIDE 0.9 % IV SOLN
500.0000 mL | Freq: Once | INTRAVENOUS | Status: AC
  Administered 2022-07-12: 500 mL via INTRAVENOUS

## 2022-07-12 MED ORDER — PROPOFOL 10 MG/ML IV BOLUS
INTRAVENOUS | Status: DC | PRN
  Administered 2022-07-12: 60 mg via INTRAVENOUS

## 2022-07-12 MED ORDER — LACTATED RINGERS IV SOLN: INTRAVENOUS | Status: DC

## 2022-07-12 MED ORDER — MIDAZOLAM HCL 2 MG/2ML IJ SOLN
INTRAMUSCULAR | Status: AC
  Filled 2022-07-12: qty 2

## 2022-07-12 MED ORDER — MIDAZOLAM HCL 2 MG/2ML IJ SOLN: 2.0000 mg | Freq: Once | INTRAMUSCULAR | Status: DC

## 2022-07-12 MED ORDER — LABETALOL HCL 5 MG/ML IV SOLN
INTRAVENOUS | Status: AC
  Filled 2022-07-12: qty 8

## 2022-07-12 MED ORDER — LABETALOL HCL 5 MG/ML IV SOLN
INTRAVENOUS | Status: DC | PRN
  Administered 2022-07-12: 10 mg via INTRAVENOUS

## 2022-07-12 MED ORDER — ONDANSETRON HCL 4 MG/2ML IJ SOLN: 4.0000 mg | Freq: Once | INTRAMUSCULAR | Status: DC | PRN

## 2022-07-12 MED ORDER — KETOROLAC TROMETHAMINE 30 MG/ML IJ SOLN
INTRAMUSCULAR | Status: AC
  Filled 2022-07-12: qty 1

## 2022-07-12 NOTE — Transfer of Care (Signed)
Immediate Anesthesia Transfer of Care Note  Patient: Jonathan Alexander.  Procedure(s) Performed: ECT TX  Patient Location: PACU  Anesthesia Type:General  Level of Consciousness: drowsy  Airway & Oxygen Therapy: Patient Spontanous Breathing  Post-op Assessment: Report given to RN and Post -op Vital signs reviewed and stable  Post vital signs: Reviewed  Last Vitals:  Vitals Value Taken Time  BP    Temp    Pulse    Resp    SpO2      Last Pain:  Vitals:   07/12/22 1114  TempSrc: Oral  PainSc: 0-No pain         Complications: No notable events documented.

## 2022-07-12 NOTE — Anesthesia Preprocedure Evaluation (Addendum)
Anesthesia Evaluation  Patient identified by MRN, date of birth, ID band Patient awake    Reviewed: Allergy & Precautions, NPO status , Patient's Chart, lab work & pertinent test results  History of Anesthesia Complications Negative for: history of anesthetic complications  Airway Mallampati: III   Neck ROM: Full    Dental  (+) Missing, Loose,    Pulmonary Current Smoker and Patient abstained from smoking.   Pulmonary exam normal breath sounds clear to auscultation       Cardiovascular hypertension, Normal cardiovascular exam Rhythm:Regular Rate:Normal  ECG 12/05/20:  Sinus tachycardia Nonspecific T wave abnormality   Neuro/Psych  PSYCHIATRIC DISORDERS   Bipolar Disorder Schizophrenia  negative neurological ROS     GI/Hepatic negative GI ROS,,,  Endo/Other  Obesity; prediabetes  Renal/GU negative Renal ROS     Musculoskeletal   Abdominal   Peds  Hematology negative hematology ROS (+)   Anesthesia Other Findings   Reproductive/Obstetrics                             Anesthesia Physical Anesthesia Plan  ASA: 2  Anesthesia Plan: General   Post-op Pain Management:    Induction: Intravenous  PONV Risk Score and Plan: 1 and TIVA and Treatment may vary due to age or medical condition  Airway Management Planned: Natural Airway  Additional Equipment:   Intra-op Plan:   Post-operative Plan:   Informed Consent: I have reviewed the patients History and Physical, chart, labs and discussed the procedure including the risks, benefits and alternatives for the proposed anesthesia with the patient or authorized representative who has indicated his/her understanding and acceptance.       Plan Discussed with: CRNA  Anesthesia Plan Comments: (Serial consent on chart.  LMA/GETA backup discussed.  Patient consented for risks of anesthesia including but not limited to:  - adverse reactions to  medications - damage to eyes, teeth, lips or other oral mucosa - nerve damage due to positioning  - sore throat or hoarseness - damage to heart, brain, nerves, lungs, other parts of body or loss of life  Informed patient about role of CRNA in peri- and intra-operative care.  Patient voiced understanding.)        Anesthesia Quick Evaluation

## 2022-07-12 NOTE — Anesthesia Postprocedure Evaluation (Signed)
Anesthesia Post Note  Patient: Jonathan Alexander.  Procedure(s) Performed: ECT TX  Patient location during evaluation: PACU Anesthesia Type: General Level of consciousness: awake and alert, oriented and patient cooperative Pain management: pain level controlled Vital Signs Assessment: post-procedure vital signs reviewed and stable Respiratory status: spontaneous breathing, nonlabored ventilation and respiratory function stable Cardiovascular status: blood pressure returned to baseline and stable Postop Assessment: adequate PO intake Anesthetic complications: no   No notable events documented.   Last Vitals:  Vitals:   07/12/22 1400 07/12/22 1414  BP: 104/77   Pulse: 95 97  Resp: 19 (!) 21  Temp:  (!) 36.1 C  SpO2: 94% 99%    Last Pain:  Vitals:   07/12/22 1414  TempSrc:   PainSc: 0-No pain                 Reed Breech

## 2022-07-12 NOTE — Procedures (Signed)
ECT SERVICES Physician's Interval Evaluation & Treatment Note  Patient Identification: Jonathan Alexander. MRN:  [id number] Date of Evaluation:  07/12/2022 TX #: 67  MADRS:   MMSE:   P.E. Findings:  No change to physical exam  Psychiatric Interval Note:  Stable  Subjective:  Patient is a 36 y.o. male seen for evaluation for Electroconvulsive Therapy. No complaint  Treatment Summary:   []   Right Unilateral             [x]  Bilateral   % Energy : 1.0 ms 60%   Impedance: 850 ohms  Seizure Energy Index: 13,809 V squared  Postictal Suppression Index: 90%  Seizure Concordance Index: 98%  Medications  Pre Shock: Robinul 0.2 mg ketamine 80 mg propofol 60 mg succinylcholine 100 mg  Post Shock: Versed 2 mg  Seizure Duration: 21 seconds EMG 37 seconds EEG   Comments: Follow-up 6 weeks  Lungs:  [x]   Clear to auscultation               []  Other:   Heart:    [x]   Regular rhythm             []  irregular rhythm    [x]   Previous H&P reviewed, patient examined and there are NO CHANGES                 []   Previous H&P reviewed, patient examined and there are changes noted.   , MD 12/8/20235:22 PM

## 2022-07-12 NOTE — H&P (Signed)
Jonathan Alexander. is an 36 y.o. male.   Chief Complaint: No new complaint HPI: Bipolar disorder or schizoaffective disorder  Past Medical History:  Diagnosis Date   Schizophrenia (HCC)    Tachycardia     History reviewed. No pertinent surgical history.  Family History  Family history unknown: Yes   Social History:  reports that he has been smoking cigarettes. He has a 4.00 pack-year smoking history. He has never used smokeless tobacco. He reports that he does not drink alcohol and does not use drugs.  Allergies:  Allergies  Allergen Reactions   Divalproex Sodium    Shellfish-Derived Products     (Not in a hospital admission)   No results found for this or any previous visit (from the past 48 hour(s)). No results found.  Review of Systems  Constitutional: Negative.   HENT: Negative.    Eyes: Negative.   Respiratory: Negative.    Cardiovascular: Negative.   Gastrointestinal: Negative.   Musculoskeletal: Negative.   Skin: Negative.   Neurological: Negative.   Psychiatric/Behavioral: Negative.      Blood pressure 105/77, pulse 96, temperature (!) 97 F (36.1 C), temperature source Oral, resp. rate 20, height 5\' 6"  (1.676 m), weight 99.3 kg, SpO2 99 %. Physical Exam Vitals reviewed.  Constitutional:      Appearance: He is well-developed.  HENT:     Head: Normocephalic and atraumatic.  Eyes:     Conjunctiva/sclera: Conjunctivae normal.     Pupils: Pupils are equal, round, and reactive to light.  Cardiovascular:     Heart sounds: Normal heart sounds.  Pulmonary:     Effort: Pulmonary effort is normal.  Abdominal:     Palpations: Abdomen is soft.  Musculoskeletal:        General: Normal range of motion.     Cervical back: Normal range of motion.  Skin:    General: Skin is warm and dry.  Neurological:     General: No focal deficit present.     Mental Status: He is alert.  Psychiatric:        Mood and Affect: Mood normal.      Assessment/Plan Ongoing  maintenance treatment  , MD 07/12/2022, 5:22 PM

## 2022-08-10 ENCOUNTER — Other Ambulatory Visit: Payer: Self-pay | Admitting: Psychiatry

## 2022-08-12 ENCOUNTER — Inpatient Hospital Stay: Admission: RE | Admit: 2022-08-12 | Payer: 59 | Source: Ambulatory Visit

## 2022-08-12 ENCOUNTER — Encounter: Payer: Self-pay | Admitting: Registered Nurse

## 2022-08-25 ENCOUNTER — Other Ambulatory Visit: Payer: Self-pay | Admitting: Psychiatry

## 2022-08-26 ENCOUNTER — Encounter: Payer: Self-pay | Admitting: Anesthesiology

## 2022-08-26 ENCOUNTER — Encounter
Admission: RE | Admit: 2022-08-26 | Discharge: 2022-08-26 | Disposition: A | Discharge status: Home / Self Care | Payer: 59 | Source: Ambulatory Visit | Attending: Psychiatry | Admitting: Psychiatry

## 2022-08-26 DIAGNOSIS — F17210 Nicotine dependence, cigarettes, uncomplicated: Secondary | ICD-10-CM | POA: Insufficient documentation

## 2022-08-26 DIAGNOSIS — F209 Schizophrenia, unspecified: Secondary | ICD-10-CM | POA: Diagnosis present

## 2022-08-26 DIAGNOSIS — F250 Schizoaffective disorder, bipolar type: Secondary | ICD-10-CM

## 2022-08-26 DIAGNOSIS — R569 Unspecified convulsions: Secondary | ICD-10-CM | POA: Diagnosis not present

## 2022-08-26 MED ORDER — PROPOFOL 10 MG/ML IV BOLUS
INTRAVENOUS | Status: DC | PRN
  Administered 2022-08-26: 60 mg via INTRAVENOUS

## 2022-08-26 MED ORDER — MIDAZOLAM HCL 2 MG/2ML IJ SOLN
INTRAMUSCULAR | Status: AC
  Filled 2022-08-26: qty 2

## 2022-08-26 MED ORDER — KETAMINE HCL 50 MG/5ML IJ SOSY
PREFILLED_SYRINGE | INTRAMUSCULAR | Status: AC
  Filled 2022-08-26: qty 5

## 2022-08-26 MED ORDER — SODIUM CHLORIDE 0.9 % IV SOLN
500.0000 mL | Freq: Once | INTRAVENOUS | Status: AC
  Administered 2022-08-26: 500 mL via INTRAVENOUS

## 2022-08-26 MED ORDER — GLYCOPYRROLATE 0.2 MG/ML IJ SOLN
INTRAMUSCULAR | Status: AC
  Filled 2022-08-26: qty 2

## 2022-08-26 MED ORDER — ONDANSETRON HCL 4 MG/2ML IJ SOLN: 4.0000 mg | Freq: Once | INTRAMUSCULAR | Status: DC | PRN

## 2022-08-26 MED ORDER — FENTANYL CITRATE (PF) 100 MCG/2ML IJ SOLN: 25.0000 ug | INTRAMUSCULAR | Status: DC | PRN

## 2022-08-26 MED ORDER — GLYCOPYRROLATE 0.2 MG/ML IJ SOLN
0.3000 mg | Freq: Once | INTRAMUSCULAR | Status: AC
  Administered 2022-08-26: 0.3 mg via INTRAVENOUS

## 2022-08-26 MED ORDER — KETOROLAC TROMETHAMINE 30 MG/ML IJ SOLN
INTRAMUSCULAR | Status: AC
  Filled 2022-08-26: qty 1

## 2022-08-26 MED ORDER — SUCCINYLCHOLINE CHLORIDE 200 MG/10ML IV SOSY
PREFILLED_SYRINGE | INTRAVENOUS | Status: DC | PRN
  Administered 2022-08-26: 150 mg via INTRAVENOUS

## 2022-08-26 MED ORDER — KETOROLAC TROMETHAMINE 30 MG/ML IJ SOLN
30.0000 mg | Freq: Once | INTRAMUSCULAR | Status: AC
  Administered 2022-08-26: 30 mg via INTRAVENOUS

## 2022-08-26 MED ORDER — MIDAZOLAM HCL 2 MG/2ML IJ SOLN
INTRAMUSCULAR | Status: DC | PRN
  Administered 2022-08-26: 2 mg via INTRAVENOUS

## 2022-08-26 MED ORDER — SODIUM CHLORIDE 0.9 % IV SOLN: INTRAVENOUS | Status: DC | PRN

## 2022-08-26 MED ORDER — KETAMINE HCL 10 MG/ML IJ SOLN
INTRAMUSCULAR | Status: DC | PRN
  Administered 2022-08-26: 80 mg via INTRAVENOUS

## 2022-08-26 NOTE — Transfer of Care (Signed)
Immediate Anesthesia Transfer of Care Note  Patient: Kou Gucciardo.  Procedure(s) Performed: ECT TX  Patient Location: PACU  Anesthesia Type:General  Level of Consciousness: awake  Airway & Oxygen Therapy: Patient Spontanous Breathing and Patient connected to face mask oxygen  Post-op Assessment: Report given to RN and Post -op Vital signs reviewed and stable  Post vital signs: Reviewed and stable  Last Vitals:  Vitals Value Taken Time  BP 120/74 08/26/22 1315  Temp 36.9 C 08/26/22 1315  Pulse 106 08/26/22 1317  Resp 18 08/26/22 1317  SpO2 100 % 08/26/22 1317  Vitals shown include unvalidated device data.  Last Pain:  Vitals:   08/26/22 1315  TempSrc:   PainSc: Asleep         Complications: No notable events documented.

## 2022-08-26 NOTE — Anesthesia Postprocedure Evaluation (Signed)
Anesthesia Post Note  Patient: Jonathan Alexander.  Procedure(s) Performed: ECT TX  Patient location during evaluation: PACU Anesthesia Type: General Level of consciousness: awake Pain management: satisfactory to patient Vital Signs Assessment: post-procedure vital signs reviewed and stable Respiratory status: spontaneous breathing and nonlabored ventilation Cardiovascular status: stable Anesthetic complications: no  No notable events documented.   Last Vitals:  Vitals:   08/26/22 1315 08/26/22 1320  BP: 120/74 (!) 124/91  Pulse: (!) 108 (!) 109  Resp: 16 18  Temp: 36.9 C   SpO2: 100% 100%    Last Pain:  Vitals:   08/26/22 1320  TempSrc:   PainSc: Asleep                 VAN STAVEREN,Chayce Rullo

## 2022-08-26 NOTE — Anesthesia Preprocedure Evaluation (Signed)
Anesthesia Evaluation  Patient identified by MRN, date of birth, ID band Patient awake    Reviewed: Allergy & Precautions, NPO status , Patient's Chart, lab work & pertinent test results  Airway Mallampati: II  TM Distance: >3 FB Neck ROM: full    Dental  (+) Teeth Intact   Pulmonary neg pulmonary ROS, COPD, Current Smoker   Pulmonary exam normal  + decreased breath sounds      Cardiovascular Exercise Tolerance: Good hypertension, negative cardio ROS Normal cardiovascular exam Rhythm:Regular     Neuro/Psych     Bipolar Disorder Schizophrenia  negative neurological ROS  negative psych ROS   GI/Hepatic negative GI ROS, Neg liver ROS,,,  Endo/Other  negative endocrine ROS  Morbid obesity  Renal/GU negative Renal ROS  negative genitourinary   Musculoskeletal negative musculoskeletal ROS (+)    Abdominal  (+) + obese  Peds negative pediatric ROS (+)  Hematology negative hematology ROS (+)   Anesthesia Other Findings Past Medical History: No date: Schizophrenia (HCC) No date: Tachycardia  History reviewed. No pertinent surgical history.     Reproductive/Obstetrics negative OB ROS                              Anesthesia Physical Anesthesia Plan  ASA: 3  Anesthesia Plan: General   Post-op Pain Management:    Induction: Intravenous  PONV Risk Score and Plan: Propofol infusion and TIVA  Airway Management Planned: Natural Airway and Nasal Cannula  Additional Equipment:   Intra-op Plan:   Post-operative Plan:   Informed Consent: I have reviewed the patients History and Physical, chart, labs and discussed the procedure including the risks, benefits and alternatives for the proposed anesthesia with the patient or authorized representative who has indicated his/her understanding and acceptance.     Dental Advisory Given  Plan Discussed with: CRNA and Surgeon  Anesthesia Plan  Comments:         Anesthesia Quick Evaluation  

## 2022-08-26 NOTE — Procedures (Signed)
ECT SERVICES Physician's Interval Evaluation & Treatment Note  Patient Identification: Jonathan Alexander. MRN:  [id number] Date of Evaluation:  08/26/2022 TX #: 68  MADRS:   MMSE:   P.E. Findings:  No change to physical exam  Psychiatric Interval Note:  Stable mood stable behavior  Subjective:  Patient is a 37 y.o. male seen for evaluation for Electroconvulsive Therapy. No new complaints  Treatment Summary:   []   Right Unilateral             [x]  Bilateral   % Energy : 1.0 ms 60%   Impedance: 1390 ohms  Seizure Energy Index: 36,355 V squared  Postictal Suppression Index: No reading  Seizure Concordance Index: 53%  Medications  Pre Shock: Robinul 0.2 mg ketamine 80 mg propofol 60 mg succinylcholine 100 mg  Post Shock: Versed 2 mg  Seizure Duration: 18 seconds EMG 34 seconds EEG   Comments: Follow-up 6 weeks  Lungs:  [x]   Clear to auscultation               []  Other:   Heart:    [x]   Regular rhythm             []  irregular rhythm    [x]   Previous H&P reviewed, patient examined and there are NO CHANGES                 []   Previous H&P reviewed, patient examined and there are changes noted.   Alethia Berthold, MD 1/22/20242:45 PM

## 2022-08-26 NOTE — H&P (Signed)
Jonathan Alexander. is an 37 y.o. male.   Chief Complaint: no complaint HPI: mood stable  Past Medical History:  Diagnosis Date   Schizophrenia ([place])    Tachycardia     History reviewed. No pertinent surgical history.  Family History  Family history unknown: Yes   Social History:  reports that he has been smoking cigarettes. He has a 4.00 pack-year smoking history. He has never used smokeless tobacco. He reports that he does not drink alcohol and does not use drugs.  Allergies:  Allergies  Allergen Reactions   Divalproex Sodium    Shellfish-Derived Products     (Not in a hospital admission)   No results found for this or any previous visit (from the past 48 hour(s)). No results found.  Review of Systems  Constitutional: Negative.   HENT: Negative.    Eyes: Negative.   Respiratory: Negative.    Cardiovascular: Negative.   Gastrointestinal: Negative.   Musculoskeletal: Negative.   Skin: Negative.   Neurological: Negative.   Psychiatric/Behavioral: Negative.      Blood pressure 137/87, pulse 99, temperature 98.3 F (36.8 C), temperature source Oral, resp. rate 18, SpO2 99 %. Physical Exam Vitals and nursing note reviewed.  Constitutional:      Appearance: He is well-developed.  HENT:     Head: Normocephalic and atraumatic.  Eyes:     Conjunctiva/sclera: Conjunctivae normal.     Pupils: Pupils are equal, round, and reactive to light.  Cardiovascular:     Heart sounds: Normal heart sounds.  Pulmonary:     Effort: Pulmonary effort is normal.  Abdominal:     Palpations: Abdomen is soft.  Musculoskeletal:        General: Normal range of motion.     Cervical back: Normal range of motion.  Skin:    General: Skin is warm and dry.  Neurological:     General: No focal deficit present.     Mental Status: He is alert.  Psychiatric:        Mood and Affect: Mood normal.      Assessment/Plan Continue maintainence  Alethia Berthold, MD 08/26/2022, 11:49  AM

## 2022-08-26 NOTE — Anesthesia Procedure Notes (Signed)
Procedure Name: General with mask airway Date/Time: 08/26/2022 1:01 PM  Performed by: Biagio Borg, CRNAPre-anesthesia Checklist: Patient identified, Emergency Drugs available, Suction available, Patient being monitored and Timeout performed Patient Re-evaluated:Patient Re-evaluated prior to induction Oxygen Delivery Method: Nasal cannula Induction Type: IV induction Ventilation: Mask ventilation without difficulty Placement Confirmation: positive ETCO2 and CO2 detector

## 2022-09-13 ENCOUNTER — Other Ambulatory Visit: Payer: 59

## 2022-09-13 ENCOUNTER — Other Ambulatory Visit: Payer: Self-pay | Admitting: Internal Medicine

## 2022-09-14 LAB — COMPREHENSIVE METABOLIC PANEL
ALT: 13 IU/L (ref 0–44)
AST: 11 IU/L (ref 0–40)
Albumin/Globulin Ratio: 2.3 — ABNORMAL HIGH (ref 1.2–2.2)
Albumin: 4.6 g/dL (ref 4.1–5.1)
Alkaline Phosphatase: 117 IU/L (ref 44–121)
BUN/Creatinine Ratio: 7 — ABNORMAL LOW (ref 9–20)
BUN: 7 mg/dL (ref 6–20)
Bilirubin Total: 0.3 mg/dL (ref 0.0–1.2)
CO2: 25 mmol/L (ref 20–29)
Calcium: 9.7 mg/dL (ref 8.7–10.2)
Chloride: 103 mmol/L (ref 96–106)
Creatinine, Ser: 1.05 mg/dL (ref 0.76–1.27)
Globulin, Total: 2 g/dL (ref 1.5–4.5)
Glucose: 94 mg/dL (ref 70–99)
Potassium: 4 mmol/L (ref 3.5–5.2)
Sodium: 140 mmol/L (ref 134–144)
Total Protein: 6.6 g/dL (ref 6.0–8.5)
eGFR: 93 mL/min/{1.73_m2} (ref >59)

## 2022-09-14 LAB — CBC WITH DIFFERENTIAL/PLATELET
Basophils Absolute: 0 10*3/uL (ref 0.0–0.2)
Basos: 0 %
EOS (ABSOLUTE): 0 10*3/uL (ref 0.0–0.4)
Eos: 0 %
Hematocrit: 36.6 % — ABNORMAL LOW (ref 37.5–51.0)
Hemoglobin: 12.2 g/dL — ABNORMAL LOW (ref 13.0–17.7)
Immature Grans (Abs): 0 10*3/uL (ref 0.0–0.1)
Immature Granulocytes: 0 %
Lymphocytes Absolute: 2.1 10*3/uL (ref 0.7–3.1)
Lymphs: 22 %
MCH: 30.4 pg (ref 26.6–33.0)
MCHC: 33.3 g/dL (ref 31.5–35.7)
MCV: 91 fL (ref 79–97)
Monocytes Absolute: 0.6 10*3/uL (ref 0.1–0.9)
Monocytes: 7 %
Neutrophils Absolute: 6.7 10*3/uL (ref 1.4–7.0)
Neutrophils: 71 %
Platelets: 286 10*3/uL (ref 150–450)
RBC: 4.01 x10E6/uL — ABNORMAL LOW (ref 4.14–5.80)
RDW: 12.3 % (ref 11.6–15.4)
WBC: 9.5 10*3/uL (ref 3.4–10.8)

## 2022-09-14 LAB — LIPID PANEL W/O CHOL/HDL RATIO
Cholesterol, Total: 96 mg/dL — ABNORMAL LOW (ref 100–199)
HDL: 32 mg/dL — ABNORMAL LOW (ref >39)
LDL Chol Calc (NIH): 41 mg/dL (ref 0–99)
Triglycerides: 126 mg/dL (ref 0–149)
VLDL Cholesterol Cal: 23 mg/dL (ref 5–40)

## 2022-09-14 LAB — HGB A1C W/O EAG: Hgb A1c MFr Bld: 5.3 % (ref 4.8–5.6)

## 2022-09-14 LAB — TSH: TSH: 1.4 u[IU]/mL (ref 0.450–4.500)

## 2022-09-16 ENCOUNTER — Ambulatory Visit (INDEPENDENT_AMBULATORY_CARE_PROVIDER_SITE_OTHER): Payer: 59 | Admitting: Internal Medicine

## 2022-09-16 ENCOUNTER — Encounter: Payer: Self-pay | Admitting: Internal Medicine

## 2022-09-16 VITALS — BP 130/82 | HR 110 | Ht 71.0 in | Wt 233.4 lb

## 2022-09-16 DIAGNOSIS — E8881 Metabolic syndrome: Secondary | ICD-10-CM | POA: Diagnosis not present

## 2022-09-16 DIAGNOSIS — F250 Schizoaffective disorder, bipolar type: Secondary | ICD-10-CM

## 2022-09-16 DIAGNOSIS — E6609 Other obesity due to excess calories: Secondary | ICD-10-CM | POA: Diagnosis not present

## 2022-09-16 DIAGNOSIS — E669 Obesity, unspecified: Secondary | ICD-10-CM

## 2022-09-16 DIAGNOSIS — I10 Essential (primary) hypertension: Secondary | ICD-10-CM | POA: Diagnosis not present

## 2022-09-16 DIAGNOSIS — Z6832 Body mass index (BMI) 32.0-32.9, adult: Secondary | ICD-10-CM

## 2022-09-16 NOTE — Progress Notes (Signed)
Established Patient Office Visit  Subjective:  Patient ID: Jonathan Alexander., male    DOB: 15-Dec-1985  Age: 37 y.o. MRN: [id number]  Chief Complaint  Patient presents with   Follow-up    Lab results    No new complaints, here for lab review and medication refills. Labs reviewed and notable for well controlled lipids and A1c in the normal range with normal TSH. Mood stable on current Rx and still undergoes ECT every month.      Past Medical History:  Diagnosis Date   Schizophrenia ([place])    Tachycardia     Social History   Socioeconomic History   Marital status: Single    Spouse name: Not on file   Number of children: Not on file   Years of education: Not on file   Highest education level: Not on file  Occupational History   Not on file  Tobacco Use   Smoking status: Every Day    Packs/day: 1.00    Years: 4.00    Total pack years: 4.00    Types: Cigarettes   Smokeless tobacco: Never  Substance and Sexual Activity   Alcohol use: No   Drug use: No   Sexual activity: Never  Other Topics Concern   Not on file  Social History Narrative   Not on file   Social Determinants of Health   Financial Resource Strain: Not on file  Food Insecurity: Not on file  Transportation Needs: Not on file  Physical Activity: Not on file  Stress: Not on file  Social Connections: Not on file  Intimate Partner Violence: Not on file    Family History  Family history unknown: Yes    Allergies  Allergen Reactions   Divalproex Sodium    Shellfish-Derived Products     Review of Systems  Constitutional: Negative.   HENT: Negative.    Eyes: Negative.   Respiratory: Negative.    Cardiovascular: Negative.   Gastrointestinal: Negative.   Genitourinary: Negative.   Skin: Negative.   Neurological: Negative.   Endo/Heme/Allergies: Negative.        Objective:   BP 130/82   Pulse (!) 110   Ht 5' 11"$  (1.803 m)   Wt 233 lb 6.4 oz (105.9 kg)   SpO2 99%   BMI 32.55 kg/m    Vitals:   09/16/22 1316  BP: 130/82  Pulse: (!) 110  Height: 5' 11"$  (1.803 m)  Weight: 233 lb 6.4 oz (105.9 kg)  SpO2: 99%  BMI (Calculated): 32.57    Physical Exam Vitals reviewed.  Constitutional:      Appearance: Normal appearance. He is obese.  HENT:     Head: Normocephalic and atraumatic.     Left Ear: There is no impacted cerumen.     Nose: Nose normal.     Mouth/Throat:     Mouth: Mucous membranes are moist.     Pharynx: No posterior oropharyngeal erythema.  Eyes:     Extraocular Movements: Extraocular movements intact.     Pupils: Pupils are equal, round, and reactive to light.  Cardiovascular:     Rate and Rhythm: Regular rhythm.     Chest Wall: PMI is not displaced.     Pulses: Normal pulses.     Heart sounds: Normal heart sounds. No murmur heard. Pulmonary:     Effort: Pulmonary effort is normal.     Breath sounds: Normal air entry. No rhonchi or rales.  Abdominal:     General: Abdomen is flat. Bowel  sounds are normal. There is no distension.     Palpations: Abdomen is soft. There is no hepatomegaly, splenomegaly or mass.     Tenderness: There is no abdominal tenderness.  Musculoskeletal:        General: Normal range of motion.     Cervical back: Normal range of motion and neck supple.     Right lower leg: No edema.     Left lower leg: No edema.  Skin:    General: Skin is warm and dry.  Neurological:     General: No focal deficit present.     Mental Status: He is alert and oriented to person, place, and time.     Cranial Nerves: No cranial nerve deficit.     Motor: No weakness.  Psychiatric:        Mood and Affect: Mood normal.        Behavior: Behavior normal.      No results found for any visits on 09/16/22.  Recent Results (from the past 2160 hour([facility]))  CBC with Differential/Platelet     Status: Abnormal   Collection Time: 09/13/22 12:18 PM  Result Value Ref Range   WBC 9.5 3.4 - 10.8 x10E3/uL   RBC 4.01 (L) 4.14 - 5.80 x10E6/uL    Hemoglobin 12.2 (L) 13.0 - 17.7 g/dL   Hematocrit 36.6 (L) 37.5 - 51.0 %   MCV 91 79 - 97 fL   MCH 30.4 26.6 - 33.0 pg   MCHC 33.3 31.5 - 35.7 g/dL   RDW 12.3 11.6 - 15.4 %   Platelets 286 150 - 450 x10E3/uL   Neutrophils 71 Not Estab. %   Lymphs 22 Not Estab. %   Monocytes 7 Not Estab. %   Eos 0 Not Estab. %   Basos 0 Not Estab. %   Neutrophils Absolute 6.7 1.4 - 7.0 x10E3/uL   Lymphocytes Absolute 2.1 0.7 - 3.1 x10E3/uL   Monocytes Absolute 0.6 0.1 - 0.9 x10E3/uL   EOS (ABSOLUTE) 0.0 0.0 - 0.4 x10E3/uL   Basophils Absolute 0.0 0.0 - 0.2 x10E3/uL   Immature Granulocytes 0 Not Estab. %   Immature Grans (Abs) 0.0 0.0 - 0.1 x10E3/uL  Comprehensive metabolic panel     Status: Abnormal   Collection Time: 09/13/22 12:18 PM  Result Value Ref Range   Glucose 94 70 - 99 mg/dL   BUN 7 6 - 20 mg/dL   Creatinine, Ser 1.05 0.76 - 1.27 mg/dL   eGFR 93 >59 mL/min/1.73   BUN/Creatinine Ratio 7 (L) 9 - 20   Sodium 140 134 - 144 mmol/L   Potassium 4.0 3.5 - 5.2 mmol/L   Chloride 103 96 - 106 mmol/L   CO2 25 20 - 29 mmol/L   Calcium 9.7 8.7 - 10.2 mg/dL   Total Protein 6.6 6.0 - 8.5 g/dL   Albumin 4.6 4.1 - 5.1 g/dL   Globulin, Total 2.0 1.5 - 4.5 g/dL   Albumin/Globulin Ratio 2.3 (H) 1.2 - 2.2   Bilirubin Total 0.3 0.0 - 1.2 mg/dL   Alkaline Phosphatase 117 44 - 121 IU/L   AST 11 0 - 40 IU/L   ALT 13 0 - 44 IU/L  Lipid Panel w/o Chol/HDL Ratio     Status: Abnormal   Collection Time: 09/13/22 12:18 PM  Result Value Ref Range   Cholesterol, Total 96 (L) 100 - 199 mg/dL   Triglycerides 126 0 - 149 mg/dL   HDL 32 (L) >39 mg/dL   VLDL Cholesterol Cal 23 5 -  40 mg/dL   LDL Chol Calc (NIH) 41 0 - 99 mg/dL  Hgb A1c w/o eAG     Status: None   Collection Time: 09/13/22 12:18 PM  Result Value Ref Range   Hgb A1c MFr Bld 5.3 4.8 - 5.6 %    Comment:          Prediabetes: 5.7 - 6.4          Diabetes: >6.4          Glycemic control for adults with diabetes: <7.0   TSH     Status: None    Collection Time: 09/13/22 12:18 PM  Result Value Ref Range   TSH 1.400 0.450 - 4.500 uIU/mL      Assessment & Plan:   Problem List Items Addressed This Visit   None 1. Primary hypertension  2. Schizoaffective disorder, bipolar type ([place]) - CBC With Diff/Platelet  3. Class 1 obesity due to excess calories without serious comorbidity with body mass index (BMI) of 32.0 to 32.9 in adult  4. Abdominal obesity and metabolic syndrome - Comprehensive metabolic panel - Lipid panel - Hemoglobin A1c    @VISPATINSTR$ @  No follow-ups on file.   Total time spent: 20 minutes  Volanda Napoleon, MD  09/16/2022

## 2022-10-02 ENCOUNTER — Encounter
Admission: RE | Admit: 2022-10-02 | Discharge: 2022-10-02 | Disposition: A | Discharge status: Home / Self Care | Payer: 59 | Source: Ambulatory Visit | Attending: Psychiatry | Admitting: Psychiatry

## 2022-10-02 ENCOUNTER — Ambulatory Visit: Payer: Self-pay | Admitting: General Practice

## 2022-10-02 ENCOUNTER — Encounter: Payer: Self-pay | Admitting: General Practice

## 2022-10-02 DIAGNOSIS — F17210 Nicotine dependence, cigarettes, uncomplicated: Secondary | ICD-10-CM | POA: Diagnosis not present

## 2022-10-02 DIAGNOSIS — F3162 Bipolar disorder, current episode mixed, moderate: Secondary | ICD-10-CM | POA: Diagnosis not present

## 2022-10-02 DIAGNOSIS — F209 Schizophrenia, unspecified: Secondary | ICD-10-CM | POA: Diagnosis present

## 2022-10-02 DIAGNOSIS — R569 Unspecified convulsions: Secondary | ICD-10-CM | POA: Diagnosis not present

## 2022-10-02 MED ORDER — OXYCODONE HCL 5 MG/5ML PO SOLN: 5.0000 mg | Freq: Once | ORAL | Status: DC | PRN

## 2022-10-02 MED ORDER — FENTANYL CITRATE (PF) 100 MCG/2ML IJ SOLN: 25.0000 ug | INTRAMUSCULAR | Status: DC | PRN

## 2022-10-02 MED ORDER — KETAMINE HCL 50 MG/5ML IJ SOSY
PREFILLED_SYRINGE | INTRAMUSCULAR | Status: AC
  Filled 2022-10-02: qty 5

## 2022-10-02 MED ORDER — PROPOFOL 10 MG/ML IV BOLUS
INTRAVENOUS | Status: DC | PRN
  Administered 2022-10-02: 60 mg via INTRAVENOUS

## 2022-10-02 MED ORDER — KETAMINE HCL 10 MG/ML IJ SOLN
INTRAMUSCULAR | Status: DC | PRN
  Administered 2022-10-02: 80 mg via INTRAVENOUS

## 2022-10-02 MED ORDER — SUCCINYLCHOLINE CHLORIDE 200 MG/10ML IV SOSY
PREFILLED_SYRINGE | INTRAVENOUS | Status: DC | PRN
  Administered 2022-10-02: 150 mg via INTRAVENOUS

## 2022-10-02 MED ORDER — MIDAZOLAM HCL 2 MG/2ML IJ SOLN
INTRAMUSCULAR | Status: DC | PRN
  Administered 2022-10-02: 2 mg via INTRAVENOUS

## 2022-10-02 MED ORDER — MIDAZOLAM HCL 2 MG/2ML IJ SOLN
INTRAMUSCULAR | Status: AC
  Filled 2022-10-02: qty 2

## 2022-10-02 MED ORDER — OXYCODONE HCL 5 MG PO TABS: 5.0000 mg | ORAL_TABLET | Freq: Once | ORAL | Status: DC | PRN

## 2022-10-02 MED ORDER — SODIUM CHLORIDE 0.9 % IV SOLN: INTRAVENOUS | Status: DC | PRN

## 2022-10-02 NOTE — Anesthesia Preprocedure Evaluation (Signed)
Anesthesia Evaluation  Patient identified by MRN, date of birth, ID band Patient awake    Reviewed: Allergy & Precautions, NPO status , Patient's Chart, lab work & pertinent test results  Airway Mallampati: II  TM Distance: >3 FB Neck ROM: full    Dental  (+) Teeth Intact   Pulmonary neg pulmonary ROS, COPD, Current Smoker   Pulmonary exam normal  + decreased breath sounds      Cardiovascular Exercise Tolerance: Good hypertension, negative cardio ROS Normal cardiovascular exam Rhythm:Regular     Neuro/Psych     Bipolar Disorder Schizophrenia  negative neurological ROS  negative psych ROS   GI/Hepatic negative GI ROS, Neg liver ROS,,,  Endo/Other  negative endocrine ROS  Morbid obesity  Renal/GU negative Renal ROS  negative genitourinary   Musculoskeletal negative musculoskeletal ROS (+)    Abdominal  (+) + obese  Peds negative pediatric ROS (+)  Hematology negative hematology ROS (+)   Anesthesia Other Findings Past Medical History: No date: Schizophrenia ([place]) No date: Tachycardia  History reviewed. No pertinent surgical history.     Reproductive/Obstetrics negative OB ROS                              Anesthesia Physical Anesthesia Plan  ASA: 3  Anesthesia Plan: General   Post-op Pain Management:    Induction: Intravenous  PONV Risk Score and Plan: Propofol infusion and TIVA  Airway Management Planned: Natural Airway and Nasal Cannula  Additional Equipment:   Intra-op Plan:   Post-operative Plan:   Informed Consent: I have reviewed the patients History and Physical, chart, labs and discussed the procedure including the risks, benefits and alternatives for the proposed anesthesia with the patient or authorized representative who has indicated his/her understanding and acceptance.     Dental Advisory Given  Plan Discussed with: CRNA and Surgeon  Anesthesia Plan  Comments:         Anesthesia Quick Evaluation

## 2022-10-02 NOTE — Anesthesia Postprocedure Evaluation (Signed)
Anesthesia Post Note  Patient: Jimel Bransford.  Procedure(s) Performed: ECT TX  Patient location during evaluation: PACU Anesthesia Type: General Level of consciousness: awake and alert Pain management: pain level controlled Vital Signs Assessment: post-procedure vital signs reviewed and stable Respiratory status: spontaneous breathing, nonlabored ventilation, respiratory function stable and patient connected to nasal cannula oxygen Cardiovascular status: blood pressure returned to baseline and stable Postop Assessment: no apparent nausea or vomiting Anesthetic complications: no  No notable events documented.   Last Vitals:  Vitals:   10/02/22 1156 10/02/22 1237  BP: 136/74 114/77  Pulse: (!) 103 100  Resp: 18 (!) 24  Temp: 36.8 C 36.8 C  SpO2: 98% 99%    Last Pain:  Vitals:   10/02/22 1158  TempSrc:   PainSc: 0-No pain                 Dimas Millin

## 2022-10-02 NOTE — Procedures (Signed)
ECT SERVICES Physician's Interval Evaluation & Treatment Note  Patient Identification: Jonathan Alexander. MRN:  [id number] Date of Evaluation:  10/02/2022 TX #: 69  MADRS:   MMSE:   P.E. Findings:  No change to physical exam  Psychiatric Interval Note:  Slightly euphoric but not agitated hostile or threatening.  Group home reports some euphoria and hyper religious speaking  Subjective:  Patient is a 37 y.o. male seen for evaluation for Electroconvulsive Therapy. Patient has no complaints  Treatment Summary:   '[]'$   Right Unilateral             '[x]'$  Bilateral   % Energy : 1.0 ms 60%   Impedance: 1340 ohms  Seizure Energy Index: 15,577 V squared  Postictal Suppression Index: 85%  Seizure Concordance Index: 97%  Medications  Pre Shock: Robinul 0.2 mg ketamine 80 mg propofol 60 mg succinylcholine 100 mg  Post Shock: Versed 2 mg  Seizure Duration: 23 seconds EMG 37 seconds EEG   Comments: Follow-up 4 weeks  Lungs:  '[x]'$   Clear to auscultation               '[]'$  Other:   Heart:    '[x]'$   Regular rhythm             '[]'$  irregular rhythm    '[x]'$   Previous H&P reviewed, patient examined and there are NO CHANGES                 '[]'$   Previous H&P reviewed, patient examined and there are changes noted.   Alethia Berthold, MD 2/28/20242:47 PM

## 2022-10-02 NOTE — Transfer of Care (Signed)
Immediate Anesthesia Transfer of Care Note  Patient: Jonathan Alexander.  Procedure(s) Performed: ECT TX  Patient Location: PACU  Anesthesia Type:General  Level of Consciousness: drowsy  Airway & Oxygen Therapy: Patient Spontanous Breathing and Patient connected to nasal cannula oxygen  Post-op Assessment: Report given to RN  Post vital signs: stable  Last Vitals:  Vitals Value Taken Time  BP    Temp    Pulse    Resp    SpO2      Last Pain:  Vitals:   10/02/22 1158  TempSrc:   PainSc: 0-No pain         Complications: No notable events documented.

## 2022-10-02 NOTE — H&P (Signed)
Jonathan Aniello Servi. is an 37 y.o. male.   Chief Complaint: Patient does not voice any complaints to me but describes himself as doing very well.  Staff had reported some hyperactivity HPI: Schizoaffective disorder with good response to ECT and maintenance treatment  Past Medical History:  Diagnosis Date   Schizophrenia ([place])    Tachycardia     History reviewed. No pertinent surgical history.  Family History  Family history unknown: Yes   Social History:  reports that he has been smoking cigarettes. He has a 4.00 pack-year smoking history. He has never used smokeless tobacco. He reports that he does not drink alcohol and does not use drugs.  Allergies:  Allergies  Allergen Reactions   Divalproex Sodium    Shellfish-Derived Products     (Not in a hospital admission)   No results found for this or any previous visit (from the past 48 hour(s)). No results found.  Review of Systems  Constitutional: Negative.   HENT: Negative.    Eyes: Negative.   Respiratory: Negative.    Cardiovascular: Negative.   Gastrointestinal: Negative.   Musculoskeletal: Negative.   Skin: Negative.   Neurological: Negative.   Psychiatric/Behavioral: Negative.      Blood pressure 136/74, pulse (!) 103, temperature 98.2 F (36.8 C), temperature source Oral, resp. rate 18, SpO2 98 %. Physical Exam Vitals and nursing note reviewed.  Constitutional:      Appearance: He is well-developed.  HENT:     Head: Normocephalic and atraumatic.  Eyes:     Conjunctiva/sclera: Conjunctivae normal.     Pupils: Pupils are equal, round, and reactive to light.  Cardiovascular:     Heart sounds: Normal heart sounds.  Pulmonary:     Effort: Pulmonary effort is normal.  Abdominal:     Palpations: Abdomen is soft.  Musculoskeletal:        General: Normal range of motion.     Cervical back: Normal range of motion.  Skin:    General: Skin is warm and dry.  Neurological:     General: No focal deficit present.      Mental Status: He is alert.  Psychiatric:        Mood and Affect: Mood normal.        Thought Content: Thought content normal.      Assessment/Plan Maintenance treatment bilateral ECT today plan likely follow-up in about 4 weeks.  Alethia Berthold, MD 10/02/2022, 12:16 PM

## 2022-10-19 ENCOUNTER — Other Ambulatory Visit: Payer: Self-pay | Admitting: Internal Medicine

## 2022-10-30 ENCOUNTER — Encounter: Payer: Self-pay | Admitting: Certified Registered"

## 2022-10-30 ENCOUNTER — Other Ambulatory Visit: Payer: Self-pay | Admitting: Psychiatry

## 2022-10-30 ENCOUNTER — Inpatient Hospital Stay: Admission: RE | Admit: 2022-10-30 | Payer: 59 | Source: Ambulatory Visit

## 2022-10-30 MED ORDER — KETAMINE HCL 50 MG/5ML IJ SOSY
PREFILLED_SYRINGE | INTRAMUSCULAR | Status: AC
  Filled 2022-10-30: qty 5

## 2022-10-30 MED ORDER — MIDAZOLAM HCL 2 MG/2ML IJ SOLN
INTRAMUSCULAR | Status: AC
  Filled 2022-10-30: qty 2

## 2022-11-04 ENCOUNTER — Encounter: Payer: Self-pay | Admitting: Certified Registered Nurse Anesthetist

## 2022-11-04 ENCOUNTER — Encounter
Admission: RE | Admit: 2022-11-04 | Discharge: 2022-11-04 | Disposition: A | Discharge status: Home / Self Care | Payer: 59 | Source: Ambulatory Visit | Attending: Psychiatry | Admitting: Psychiatry

## 2022-11-04 DIAGNOSIS — F209 Schizophrenia, unspecified: Secondary | ICD-10-CM | POA: Insufficient documentation

## 2022-11-04 DIAGNOSIS — F250 Schizoaffective disorder, bipolar type: Secondary | ICD-10-CM | POA: Diagnosis not present

## 2022-11-04 DIAGNOSIS — F17210 Nicotine dependence, cigarettes, uncomplicated: Secondary | ICD-10-CM | POA: Insufficient documentation

## 2022-11-04 DIAGNOSIS — R569 Unspecified convulsions: Secondary | ICD-10-CM | POA: Diagnosis not present

## 2022-11-04 MED ORDER — SODIUM CHLORIDE 0.9 % IV SOLN: INTRAVENOUS | Status: DC | PRN

## 2022-11-04 MED ORDER — FENTANYL CITRATE (PF) 100 MCG/2ML IJ SOLN: 25.0000 ug | INTRAMUSCULAR | Status: DC | PRN

## 2022-11-04 MED ORDER — OXYCODONE HCL 5 MG/5ML PO SOLN: 5.0000 mg | Freq: Once | ORAL | Status: DC | PRN

## 2022-11-04 MED ORDER — MIDAZOLAM HCL 2 MG/2ML IJ SOLN
INTRAMUSCULAR | Status: AC
  Filled 2022-11-04: qty 2

## 2022-11-04 MED ORDER — DROPERIDOL 2.5 MG/ML IJ SOLN: 0.6250 mg | Freq: Once | INTRAMUSCULAR | Status: DC | PRN

## 2022-11-04 MED ORDER — ACETAMINOPHEN 10 MG/ML IV SOLN: 1000.0000 mg | Freq: Once | INTRAVENOUS | Status: DC | PRN

## 2022-11-04 MED ORDER — SUCCINYLCHOLINE CHLORIDE 200 MG/10ML IV SOSY
PREFILLED_SYRINGE | INTRAVENOUS | Status: DC | PRN
  Administered 2022-11-04: 150 mg via INTRAVENOUS

## 2022-11-04 MED ORDER — KETAMINE HCL 10 MG/ML IJ SOLN
INTRAMUSCULAR | Status: DC | PRN
  Administered 2022-11-04: 80 mg via INTRAVENOUS

## 2022-11-04 MED ORDER — PROMETHAZINE HCL 25 MG/ML IJ SOLN: 6.2500 mg | INTRAMUSCULAR | Status: DC | PRN

## 2022-11-04 MED ORDER — PROPOFOL 10 MG/ML IV BOLUS
INTRAVENOUS | Status: DC | PRN
  Administered 2022-11-04: 60 mg via INTRAVENOUS

## 2022-11-04 MED ORDER — KETAMINE HCL 50 MG/5ML IJ SOSY
PREFILLED_SYRINGE | INTRAMUSCULAR | Status: AC
  Filled 2022-11-04: qty 5

## 2022-11-04 MED ORDER — LABETALOL HCL 5 MG/ML IV SOLN
INTRAVENOUS | Status: DC | PRN
  Administered 2022-11-04: 10 mg via INTRAVENOUS

## 2022-11-04 MED ORDER — OXYCODONE HCL 5 MG PO TABS: 5.0000 mg | ORAL_TABLET | Freq: Once | ORAL | Status: DC | PRN

## 2022-11-04 MED ORDER — MIDAZOLAM HCL 2 MG/2ML IJ SOLN
INTRAMUSCULAR | Status: DC | PRN
  Administered 2022-11-04: 2 mg via INTRAVENOUS

## 2022-11-04 NOTE — Transfer of Care (Signed)
Immediate Anesthesia Transfer of Care Note  Patient: Jonathan Alexander.  Procedure(s) Performed: ECT TX  Patient Location: PACU  Anesthesia Type:General  Level of Consciousness: drowsy  Airway & Oxygen Therapy: Patient Spontanous Breathing and Patient connected to face mask oxygen  Post-op Assessment: Report given to RN and Post -op Vital signs reviewed and stable  Post vital signs: Reviewed and stable  Last Vitals:  Vitals Value Taken Time  BP 113/73 11/04/22 1320  Temp    Pulse 97 11/04/22 1320  Resp 18 11/04/22 1320  SpO2 99 % 11/04/22 1320    Last Pain:  Vitals:   11/04/22 1209  TempSrc:   PainSc: 0-No pain         Complications: No notable events documented.

## 2022-11-04 NOTE — Anesthesia Preprocedure Evaluation (Signed)
Anesthesia Evaluation  Patient identified by MRN, date of birth, ID band Patient awake    Reviewed: Allergy & Precautions, H&P , NPO status , Patient's Chart, lab work & pertinent test results, reviewed documented beta blocker date and time   Airway Mallampati: II   Neck ROM: full    Dental  (+) Poor Dentition   Pulmonary neg pulmonary ROS, Current Smoker   Pulmonary exam normal        Cardiovascular Exercise Tolerance: Good hypertension, Normal cardiovascular exam Rhythm:regular Rate:Normal     Neuro/Psych  PSYCHIATRIC DISORDERS   Bipolar Disorder Schizophrenia  negative neurological ROS     GI/Hepatic negative GI ROS, Neg liver ROS,,,  Endo/Other  negative endocrine ROS    Renal/GU negative Renal ROS  negative genitourinary   Musculoskeletal   Abdominal   Peds  Hematology negative hematology ROS (+)   Anesthesia Other Findings Past Medical History: No date: Schizophrenia No date: Tachycardia History reviewed. No pertinent surgical history.   Reproductive/Obstetrics negative OB ROS                             Anesthesia Physical Anesthesia Plan  ASA: 2  Anesthesia Plan: General   Post-op Pain Management:    Induction:   PONV Risk Score and Plan:   Airway Management Planned:   Additional Equipment:   Intra-op Plan:   Post-operative Plan:   Informed Consent: I have reviewed the patients History and Physical, chart, labs and discussed the procedure including the risks, benefits and alternatives for the proposed anesthesia with the patient or authorized representative who has indicated his/her understanding and acceptance.     Dental Advisory Given  Plan Discussed with: CRNA  Anesthesia Plan Comments:        Anesthesia Quick Evaluation

## 2022-11-04 NOTE — H&P (Signed)
Jonathan Alexander. is an 37 y.o. male.   Chief Complaint: No new complaint. HPI: Stable schizoaffective disorder  Past Medical History:  Diagnosis Date   Schizophrenia    Tachycardia     History reviewed. No pertinent surgical history.  Family History  Family history unknown: Yes   Social History:  reports that he has been smoking cigarettes. He has a 4.00 pack-year smoking history. He has never used smokeless tobacco. He reports that he does not drink alcohol and does not use drugs.  Allergies:  Allergies  Allergen Reactions   Divalproex Sodium    Shellfish-Derived Products     (Not in a hospital admission)   No results found for this or any previous visit (from the past 48 hour(s)). No results found.  Review of Systems  Constitutional: Negative.   HENT: Negative.    Eyes: Negative.   Respiratory: Negative.    Cardiovascular: Negative.   Gastrointestinal: Negative.   Musculoskeletal: Negative.   Skin: Negative.   Neurological: Negative.   Psychiatric/Behavioral: Negative.      Blood pressure 114/71, pulse (!) 101, temperature 98.2 F (36.8 C), resp. rate 18, SpO2 100 %. Physical Exam Vitals and nursing note reviewed.  Constitutional:      Appearance: He is well-developed.  HENT:     Head: Normocephalic and atraumatic.  Eyes:     Conjunctiva/sclera: Conjunctivae normal.     Pupils: Pupils are equal, round, and reactive to light.  Cardiovascular:     Heart sounds: Normal heart sounds.  Pulmonary:     Effort: Pulmonary effort is normal.  Abdominal:     Palpations: Abdomen is soft.  Musculoskeletal:        General: Normal range of motion.     Cervical back: Normal range of motion.  Skin:    General: Skin is warm and dry.  Neurological:     General: No focal deficit present.     Mental Status: He is alert.  Psychiatric:        Mood and Affect: Mood normal.        Thought Content: Thought content normal.      Assessment/Plan ECT today follow-up  about 4 weeks  Alethia Berthold, MD 11/04/2022, 4:42 PM

## 2022-11-04 NOTE — Anesthesia Procedure Notes (Signed)
Procedure Name: General with mask airway Date/Time: 11/04/2022 1:22 PM  Performed by: Lily Peer, Hagop Mccollam, CRNAPre-anesthesia Checklist: Patient identified, Emergency Drugs available, Suction available, Patient being monitored and Timeout performed Patient Re-evaluated:Patient Re-evaluated prior to induction Oxygen Delivery Method: Circle system utilized Preoxygenation: Pre-oxygenation with 100% oxygen Induction Type: IV induction Ventilation: Mask ventilation throughout procedure

## 2022-11-04 NOTE — Procedures (Signed)
ECT SERVICES Physician's Interval Evaluation & Treatment Note  Patient Identification: Jonathan Alexander. MRN:  [id number] Date of Evaluation:  11/04/2022 TX #: 70  MADRS:   MMSE:   P.E. Findings:  No change to physical exam  Psychiatric Interval Note:  Stable.  Subjective:  Patient is a 37 y.o. male seen for evaluation for Electroconvulsive Therapy. No complaints  Treatment Summary:   []   Right Unilateral             [x]  Bilateral   % Energy : 1.0 ms 60%   Impedance: 910 ohms  Seizure Energy Index: 8534 V squared  Postictal Suppression Index: 22%  Seizure Concordance Index: 98%  Medications  Pre Shock: Robinul 0.2 mg ketamine 80 mg propofol 60 mg succinylcholine 100 mg  Post Shock: Versed 2 mg  Seizure Duration: EMG 23 seconds EEG 38 seconds   Comments: Follow-up in 4 weeks  Lungs:  [x]   Clear to auscultation               []  Other:   Heart:    [x]   Regular rhythm             []  irregular rhythm    [x]   Previous H&P reviewed, patient examined and there are NO CHANGES                 []   Previous H&P reviewed, patient examined and there are changes noted.   Alethia Berthold, MD 4/1/20244:43 PM

## 2022-11-06 NOTE — Anesthesia Postprocedure Evaluation (Signed)
Anesthesia Post Note  Patient: Jonathan Alexander.  Procedure(s) Performed: ECT TX  Patient location during evaluation: PACU Anesthesia Type: General Level of consciousness: awake and alert Pain management: pain level controlled Vital Signs Assessment: post-procedure vital signs reviewed and stable Respiratory status: spontaneous breathing, nonlabored ventilation, respiratory function stable and patient connected to nasal cannula oxygen Cardiovascular status: blood pressure returned to baseline and stable Postop Assessment: no apparent nausea or vomiting Anesthetic complications: no   No notable events documented.   Last Vitals:  Vitals:   11/04/22 1405 11/04/22 1410  BP:    Pulse: (!) 101 (!) 101  Resp: 16 18  Temp: 36.8 C   SpO2: 100% 100%    Last Pain:  Vitals:   11/04/22 1405  TempSrc:   PainSc: 0-No pain                 Molli Barrows

## 2022-11-15 ENCOUNTER — Other Ambulatory Visit: Payer: Self-pay | Admitting: Internal Medicine

## 2022-12-01 ENCOUNTER — Other Ambulatory Visit: Payer: Self-pay | Admitting: Psychiatry

## 2022-12-02 ENCOUNTER — Ambulatory Visit
Admission: RE | Admit: 2022-12-02 | Discharge: 2022-12-02 | Disposition: A | Discharge status: Home / Self Care | Payer: 59 | Source: Ambulatory Visit | Attending: Psychiatry | Admitting: Psychiatry

## 2022-12-02 ENCOUNTER — Ambulatory Visit: Payer: Self-pay | Admitting: Anesthesiology

## 2022-12-02 ENCOUNTER — Encounter: Payer: Self-pay | Admitting: Anesthesiology

## 2022-12-02 DIAGNOSIS — F250 Schizoaffective disorder, bipolar type: Secondary | ICD-10-CM | POA: Diagnosis not present

## 2022-12-02 DIAGNOSIS — F17210 Nicotine dependence, cigarettes, uncomplicated: Secondary | ICD-10-CM | POA: Diagnosis not present

## 2022-12-02 DIAGNOSIS — F259 Schizoaffective disorder, unspecified: Secondary | ICD-10-CM | POA: Diagnosis present

## 2022-12-02 DIAGNOSIS — R569 Unspecified convulsions: Secondary | ICD-10-CM | POA: Diagnosis not present

## 2022-12-02 MED ORDER — GLYCOPYRROLATE 0.2 MG/ML IJ SOLN
INTRAMUSCULAR | Status: AC
  Filled 2022-12-02: qty 2

## 2022-12-02 MED ORDER — SODIUM CHLORIDE 0.9 % IV SOLN
500.0000 mL | Freq: Once | INTRAVENOUS | Status: AC
  Administered 2022-12-02: 500 mL via INTRAVENOUS

## 2022-12-02 MED ORDER — MIDAZOLAM HCL 2 MG/2ML IJ SOLN
INTRAMUSCULAR | Status: AC
  Filled 2022-12-02: qty 2

## 2022-12-02 MED ORDER — PROPOFOL 10 MG/ML IV BOLUS
INTRAVENOUS | Status: AC
  Filled 2022-12-02: qty 20

## 2022-12-02 MED ORDER — LABETALOL HCL 5 MG/ML IV SOLN
INTRAVENOUS | Status: DC | PRN
  Administered 2022-12-02: 10 mg via INTRAVENOUS

## 2022-12-02 MED ORDER — PROPOFOL 10 MG/ML IV BOLUS
INTRAVENOUS | Status: DC | PRN
  Administered 2022-12-02: 60 mg via INTRAVENOUS

## 2022-12-02 MED ORDER — SUCCINYLCHOLINE CHLORIDE 200 MG/10ML IV SOSY
PREFILLED_SYRINGE | INTRAVENOUS | Status: DC | PRN
  Administered 2022-12-02: 150 mg via INTRAVENOUS

## 2022-12-02 MED ORDER — GLYCOPYRROLATE 0.2 MG/ML IJ SOLN
0.3000 mg | Freq: Once | INTRAMUSCULAR | Status: AC
  Administered 2022-12-02: 0.3 mg via INTRAVENOUS

## 2022-12-02 MED ORDER — KETAMINE HCL 50 MG/5ML IJ SOSY
PREFILLED_SYRINGE | INTRAMUSCULAR | Status: AC
  Filled 2022-12-02: qty 5

## 2022-12-02 MED ORDER — SODIUM CHLORIDE 0.9 % IV SOLN: INTRAVENOUS | Status: DC | PRN

## 2022-12-02 MED ORDER — MIDAZOLAM HCL 2 MG/2ML IJ SOLN
2.0000 mg | Freq: Once | INTRAMUSCULAR | Status: AC
  Administered 2022-12-02: 2 mg via INTRAVENOUS

## 2022-12-02 MED ORDER — KETAMINE HCL 10 MG/ML IJ SOLN
INTRAMUSCULAR | Status: DC | PRN
  Administered 2022-12-02: 80 mg via INTRAVENOUS

## 2022-12-02 NOTE — Anesthesia Preprocedure Evaluation (Addendum)
Anesthesia Evaluation  Patient identified by MRN, date of birth, ID band Patient awake    Reviewed: Allergy & Precautions, NPO status , Patient's Chart, lab work & pertinent test results  History of Anesthesia Complications Negative for: history of anesthetic complications  Airway Mallampati: III   Neck ROM: Full    Dental  (+) Missing, Loose   Pulmonary Current Smoker and Patient abstained from smoking.   Pulmonary exam normal breath sounds clear to auscultation       Cardiovascular hypertension, Normal cardiovascular exam Rhythm:Regular Rate:Normal     Neuro/Psych  PSYCHIATRIC DISORDERS   Bipolar Disorder Schizophrenia  negative neurological ROS     GI/Hepatic negative GI ROS,,,  Endo/Other  Obesity, prediabetes  Renal/GU negative Renal ROS     Musculoskeletal   Abdominal   Peds  Hematology negative hematology ROS (+)   Anesthesia Other Findings   Reproductive/Obstetrics                             Anesthesia Physical Anesthesia Plan  ASA: 2  Anesthesia Plan: General   Post-op Pain Management:    Induction: Intravenous  PONV Risk Score and Plan: 1 and TIVA and Treatment may vary due to age or medical condition  Airway Management Planned: Natural Airway  Additional Equipment:   Intra-op Plan:   Post-operative Plan:   Informed Consent: I have reviewed the patients History and Physical, chart, labs and discussed the procedure including the risks, benefits and alternatives for the proposed anesthesia with the patient or authorized representative who has indicated his/her understanding and acceptance.       Plan Discussed with: CRNA  Anesthesia Plan Comments: (Serial consent on chart. LMA/GETA backup discussed.  Patient consented for risks of anesthesia including but not limited to:  - adverse reactions to medications - damage to eyes, teeth, lips or other oral mucosa -  nerve damage due to positioning  - sore throat or hoarseness - damage to heart, brain, nerves, lungs, other parts of body or loss of life  Informed patient about role of CRNA in peri- and intra-operative care.  Patient voiced understanding.)       Anesthesia Quick Evaluation

## 2022-12-02 NOTE — Transfer of Care (Signed)
Immediate Anesthesia Transfer of Care Note  Patient: Jonathan Alexander.  Procedure(s) Performed: ECT TX  Patient Location: PACU  Anesthesia Type:General  Level of Consciousness: drowsy  Airway & Oxygen Therapy: Patient Spontanous Breathing and Patient connected to nasal cannula oxygen  Post-op Assessment: Report given to RN  Post vital signs: stable  Last Vitals:  Vitals Value Taken Time  BP    Temp    Pulse 97 12/02/22 1251  Resp 23 12/02/22 1251  SpO2 98 % 12/02/22 1251  Vitals shown include unvalidated device data.  Last Pain:  Vitals:   12/02/22 1149  TempSrc:   PainSc: 0-No pain         Complications: No notable events documented.

## 2022-12-02 NOTE — Anesthesia Postprocedure Evaluation (Signed)
Anesthesia Post Note  Patient: Jonathan Alexander.  Procedure(s) Performed: ECT TX  Patient location during evaluation: PACU Anesthesia Type: General Level of consciousness: awake and alert, oriented and patient cooperative Pain management: pain level controlled Vital Signs Assessment: post-procedure vital signs reviewed and stable Respiratory status: spontaneous breathing, nonlabored ventilation and respiratory function stable Cardiovascular status: blood pressure returned to baseline and stable Postop Assessment: adequate PO intake Anesthetic complications: no   No notable events documented.   Last Vitals:  Vitals:   12/02/22 1315 12/02/22 1319  BP: 122/82   Pulse: 98 98  Resp: 13 13  Temp:    SpO2: 99% 98%    Last Pain:  Vitals:   12/02/22 1319  TempSrc:   PainSc: 0-No pain                 Reed Breech

## 2022-12-02 NOTE — Procedures (Signed)
ECT SERVICES Physician's Interval Evaluation & Treatment Note  Patient Identification: Jurgen Horton Marshall. MRN:  [id number] Date of Evaluation:  12/02/2022 TX #: 71  MADRS:   MMSE:   P.E. Findings:  Unremarkable physical exam  Psychiatric Interval Note:  No report of any symptoms.  No report from the group home  Subjective:  Patient is a 37 y.o. male seen for evaluation for Electroconvulsive Therapy. No complaints  Treatment Summary:   []   Right Unilateral             [x]  Bilateral   % Energy : 1.0 ms 60%   Impedance: 1690 ohms  Seizure Energy Index: 23,011 V squared  Postictal Suppression Index: 49%  Seizure Concordance Index: 98%  Medications  Pre Shock: Robinul 0.2 mg ketamine 80 mg propofol 60 mg succinylcholine 100 mg  Post Shock: Versed 2 mg  Seizure Duration: EMG 21 seconds EEG 40 seconds   Comments: Well-tolerated.  Follow-up in 4 to 5 weeks.  Lungs:  [x]   Clear to auscultation               []  Other:   Heart:    [x]   Regular rhythm             []  irregular rhythm    [x]   Previous H&P reviewed, patient examined and there are NO CHANGES                 []   Previous H&P reviewed, patient examined and there are changes noted.   [name], MD 4/29/20242:24 PM

## 2022-12-02 NOTE — H&P (Signed)
Jonathan Alexander. is an 37 y.o. male.   Chief Complaint: No specific complaint HPI: Patient with schizoaffective disorder.  Stable with medication and maintenance ECT.  Past Medical History:  Diagnosis Date   Schizophrenia (HCC)    Tachycardia     History reviewed. No pertinent surgical history.  Family History  Family history unknown: Yes   Social History:  reports that he has been smoking cigarettes. He has a 4.00 pack-year smoking history. He has never used smokeless tobacco. He reports that he does not drink alcohol and does not use drugs.  Allergies:  Allergies  Allergen Reactions   Divalproex Sodium    Shellfish-Derived Products     (Not in a hospital admission)   No results found for this or any previous visit (from the past 48 hour(s)). No results found.  Review of Systems  Constitutional: Negative.   HENT: Negative.    Eyes: Negative.   Respiratory: Negative.    Cardiovascular: Negative.   Gastrointestinal: Negative.   Musculoskeletal: Negative.   Skin: Negative.   Neurological: Negative.   Psychiatric/Behavioral: Negative.      Blood pressure 126/79, pulse (!) 102, temperature 98.2 F (36.8 C), temperature source Oral, resp. rate 18, height 6' (1.829 m), weight 103.9 kg, SpO2 100 %. Physical Exam Vitals and nursing note reviewed.  Constitutional:      Appearance: He is well-developed.  HENT:     Head: Normocephalic and atraumatic.  Eyes:     Conjunctiva/sclera: Conjunctivae normal.     Pupils: Pupils are equal, round, and reactive to light.  Cardiovascular:     Heart sounds: Normal heart sounds.  Pulmonary:     Effort: Pulmonary effort is normal.  Abdominal:     Palpations: Abdomen is soft.  Musculoskeletal:        General: Normal range of motion.     Cervical back: Normal range of motion.  Skin:    General: Skin is warm and dry.  Neurological:     General: No focal deficit present.     Mental Status: He is alert.  Psychiatric:         Mood and Affect: Mood normal.        Behavior: Behavior normal.        Thought Content: Thought content normal.        Judgment: Judgment normal.      Assessment/Plan Treatment today.  Anticipate return probably in about 5 weeks.  [name], MD 12/02/2022, 11:57 AM

## 2022-12-09 NOTE — Addendum Note (Signed)
Encounter addended by: Rosalita Chessman, RN on: 12/09/2022 11:56 AM  Actions taken: Charge Capture section accepted

## 2022-12-13 ENCOUNTER — Other Ambulatory Visit: Payer: Self-pay | Admitting: Internal Medicine

## 2022-12-16 ENCOUNTER — Ambulatory Visit: Payer: 59 | Admitting: Internal Medicine

## 2022-12-24 ENCOUNTER — Other Ambulatory Visit: Payer: Self-pay | Admitting: Psychiatry

## 2022-12-25 ENCOUNTER — Ambulatory Visit
Admission: RE | Admit: 2022-12-25 | Discharge: 2022-12-25 | Disposition: A | Discharge status: Home / Self Care | Payer: 59 | Source: Ambulatory Visit | Attending: Psychiatry | Admitting: Psychiatry

## 2022-12-25 ENCOUNTER — Encounter: Payer: Self-pay | Admitting: Registered Nurse

## 2022-12-25 DIAGNOSIS — F250 Schizoaffective disorder, bipolar type: Secondary | ICD-10-CM

## 2022-12-25 DIAGNOSIS — F259 Schizoaffective disorder, unspecified: Secondary | ICD-10-CM | POA: Diagnosis present

## 2022-12-25 MED ORDER — PROPOFOL 500 MG/50ML IV EMUL
INTRAVENOUS | Status: DC | PRN
  Administered 2022-12-25: 60 mg via INTRAVENOUS

## 2022-12-25 MED ORDER — KETOROLAC TROMETHAMINE 30 MG/ML IJ SOLN: 30.0000 mg | Freq: Once | INTRAMUSCULAR | Status: DC

## 2022-12-25 MED ORDER — SODIUM CHLORIDE 0.9 % IV SOLN: 500.0000 mL | Freq: Once | INTRAVENOUS | Status: DC

## 2022-12-25 MED ORDER — MIDAZOLAM HCL 2 MG/2ML IJ SOLN
INTRAMUSCULAR | Status: AC
  Filled 2022-12-25: qty 2

## 2022-12-25 MED ORDER — SODIUM CHLORIDE 0.9 % IV SOLN: INTRAVENOUS | Status: DC | PRN

## 2022-12-25 MED ORDER — MIDAZOLAM HCL 2 MG/2ML IJ SOLN
INTRAMUSCULAR | Status: DC | PRN
  Administered 2022-12-25 (×2): 2 mg via INTRAVENOUS

## 2022-12-25 MED ORDER — LABETALOL HCL 5 MG/ML IV SOLN
INTRAVENOUS | Status: AC
  Filled 2022-12-25: qty 4

## 2022-12-25 MED ORDER — GLYCOPYRROLATE 0.2 MG/ML IJ SOLN: 0.3000 mg | Freq: Once | INTRAMUSCULAR | Status: DC

## 2022-12-25 MED ORDER — SUCCINYLCHOLINE CHLORIDE 200 MG/10ML IV SOSY
PREFILLED_SYRINGE | INTRAVENOUS | Status: DC | PRN
  Administered 2022-12-25: 150 mg via INTRAVENOUS

## 2022-12-25 MED ORDER — KETAMINE HCL 50 MG/5ML IJ SOSY
PREFILLED_SYRINGE | INTRAMUSCULAR | Status: AC
  Filled 2022-12-25: qty 5

## 2022-12-25 MED ORDER — KETAMINE HCL 10 MG/ML IJ SOLN
INTRAMUSCULAR | Status: DC | PRN
  Administered 2022-12-25: 80 mg via INTRAVENOUS

## 2022-12-25 MED ORDER — LABETALOL HCL 5 MG/ML IV SOLN
INTRAVENOUS | Status: DC | PRN
  Administered 2022-12-25: 10 mg via INTRAVENOUS

## 2022-12-25 MED ORDER — MIDAZOLAM HCL 2 MG/2ML IJ SOLN: 2.0000 mg | Freq: Once | INTRAMUSCULAR | Status: DC

## 2022-12-25 NOTE — Anesthesia Preprocedure Evaluation (Signed)
Anesthesia Evaluation  Patient identified by MRN, date of birth, ID band Patient awake    Reviewed: Allergy & Precautions, NPO status , Patient's Chart, lab work & pertinent test results  History of Anesthesia Complications Negative for: history of anesthetic complications  Airway Mallampati: III   Neck ROM: Full    Dental  (+) Missing, Loose, Poor Dentition, Chipped   Pulmonary Current Smoker and Patient abstained from smoking.   Pulmonary exam normal breath sounds clear to auscultation       Cardiovascular hypertension, Normal cardiovascular exam Rhythm:Regular Rate:Normal     Neuro/Psych  PSYCHIATRIC DISORDERS   Bipolar Disorder Schizophrenia  negative neurological ROS     GI/Hepatic negative GI ROS,,,  Endo/Other  Obesity, prediabetes  Renal/GU negative Renal ROS     Musculoskeletal   Abdominal   Peds  Hematology negative hematology ROS (+)   Anesthesia Other Findings Past Medical History: No date: Schizophrenia (HCC) No date: Tachycardia   Reproductive/Obstetrics                              Anesthesia Physical Anesthesia Plan  ASA: 2  Anesthesia Plan: General   Post-op Pain Management:    Induction: Intravenous  PONV Risk Score and Plan: 1 and TIVA and Treatment may vary due to age or medical condition  Airway Management Planned: Mask  Additional Equipment:   Intra-op Plan:   Post-operative Plan:   Informed Consent: I have reviewed the patients History and Physical, chart, labs and discussed the procedure including the risks, benefits and alternatives for the proposed anesthesia with the patient or authorized representative who has indicated his/her understanding and acceptance.       Plan Discussed with: CRNA and Surgeon  Anesthesia Plan Comments: (Serial consent on chart. LMA/GETA backup discussed.  Patient consented for risks of anesthesia including but not  limited to:  - adverse reactions to medications - damage to eyes, teeth, lips or other oral mucosa - nerve damage due to positioning  - sore throat or hoarseness - damage to heart, brain, nerves, lungs, other parts of body or loss of life  Informed patient about role of CRNA in peri- and intra-operative care.  Patient voiced understanding.)        Anesthesia Quick Evaluation

## 2022-12-25 NOTE — Procedures (Signed)
ECT SERVICES Physician's Interval Evaluation & Treatment Note  Patient Identification: Jonathan Alexander. MRN:  [id number] Date of Evaluation:  12/25/2022 TX #: 72  MADRS:   MMSE:   P.E. Findings:  No change to physical exam  Psychiatric Interval Note:  Calm not agitated no complaints  Subjective:  Patient is a 37 y.o. male seen for evaluation for Electroconvulsive Therapy. No complaints mood fine.  Treatment Summary:   []   Right Unilateral             [x]  Bilateral   % Energy : 1.0 ms 60%   Impedance: 1440 ohms  Seizure Energy Index: 11,522 V squared  Postictal Suppression Index: 59%  Seizure Concordance Index: 98%  Medications  Pre Shock: Robinul 0.2 mg ketamine 80 mg propofol 60 mg succinylcholine 100 mg  Post Shock: Versed 2 mg  Seizure Duration: 26 seconds EMG 36 seconds EEG   Comments: Doing well stable anticipate seeing him one more time prior to my leaving the service.  Lungs:  [x]   Clear to auscultation               []  Other:   Heart:    [x]   Regular rhythm             []  irregular rhythm    [x]   Previous H&P reviewed, patient examined and there are NO CHANGES                 []   Previous H&P reviewed, patient examined and there are changes noted.   Jonathan Rasmussen, MD 5/22/20244:36 PM

## 2022-12-25 NOTE — Anesthesia Procedure Notes (Signed)
Date/Time: 12/25/2022 12:13 PM  Performed by: Stormy Fabian, CRNAPre-anesthesia Checklist: Patient identified, Emergency Drugs available, Suction available and Patient being monitored Patient Re-evaluated:Patient Re-evaluated prior to induction Oxygen Delivery Method: Circle system utilized Preoxygenation: Pre-oxygenation with 100% oxygen Induction Type: IV induction Ventilation: Mask ventilation without difficulty and Mask ventilation throughout procedure Airway Equipment and Method: Bite block Placement Confirmation: positive ETCO2 Dental Injury: Teeth and Oropharynx as per pre-operative assessment

## 2022-12-25 NOTE — Anesthesia Postprocedure Evaluation (Signed)
Anesthesia Post Note  Patient: Jonathan Alexander.  Procedure(s) Performed: ECT TX  Patient location during evaluation: PACU Anesthesia Type: General Level of consciousness: awake and alert Pain management: pain level controlled Vital Signs Assessment: post-procedure vital signs reviewed and stable Respiratory status: spontaneous breathing, nonlabored ventilation, respiratory function stable and patient connected to nasal cannula oxygen Cardiovascular status: blood pressure returned to baseline and stable Postop Assessment: no apparent nausea or vomiting Anesthetic complications: no   No notable events documented.   Last Vitals:  Vitals:   12/25/22 1300 12/25/22 1310  BP: 132/89   Pulse: 96 96  Resp: 17 (!) 21  Temp: 36.9 C   SpO2: 98% 99%    Last Pain:  Vitals:   12/25/22 1300  TempSrc:   PainSc: 0-No pain                 Cleda Mccreedy Jorey Dollard

## 2022-12-25 NOTE — Transfer of Care (Signed)
Immediate Anesthesia Transfer of Care Note  Patient: Jonathan Alexander.  Procedure(s) Performed: * No procedures listed *  Patient Location: PACU  Anesthesia Type:General  Level of Consciousness: sedated  Airway & Oxygen Therapy: Patient Spontanous Breathing and Patient connected to face mask oxygen  Post-op Assessment: Report given to RN and Post -op Vital signs reviewed and stable  Post vital signs: Reviewed and stable  Last Vitals:  Vitals:   12/25/22 1227 12/25/22 1230  BP: 110/73 104/74  Pulse: 96 93  Resp: (!) 21 19  Temp: 36.9 C   SpO2: 99% 99%    Complications: No apparent anesthesia complications

## 2022-12-25 NOTE — H&P (Signed)
Jonathan Alexander. is an 37 y.o. male.   Chief Complaint: none HPI: schizoaffective disorder  Past Medical History:  Diagnosis Date   Schizophrenia (HCC)    Tachycardia     No past surgical history on file.  Family History  Family history unknown: Yes   Social History:  reports that Jonathan Alexander has been smoking cigarettes. Jonathan Alexander has a 4.00 pack-year smoking history. Jonathan Alexander has never used smokeless tobacco. Jonathan Alexander reports that Jonathan Alexander does not drink alcohol and does not use drugs.  Allergies:  Allergies  Allergen Reactions   Divalproex Sodium    Shellfish-Derived Products     (Not in a hospital admission)   No results found for this or any previous visit (from the past 48 hour(s)). No results found.  Review of Systems  Constitutional: Negative.   HENT: Negative.    Eyes: Negative.   Respiratory: Negative.    Cardiovascular: Negative.   Gastrointestinal: Negative.   Musculoskeletal: Negative.   Skin: Negative.   Neurological: Negative.   Psychiatric/Behavioral: Negative.      There were no vitals taken for this visit. Physical Exam Vitals and nursing note reviewed.  Constitutional:      Appearance: Jonathan Alexander is well-developed.  HENT:     Head: Normocephalic and atraumatic.  Eyes:     Conjunctiva/sclera: Conjunctivae normal.     Pupils: Pupils are equal, round, and reactive to light.  Cardiovascular:     Heart sounds: Normal heart sounds.  Pulmonary:     Effort: Pulmonary effort is normal.  Abdominal:     Palpations: Abdomen is soft.  Musculoskeletal:        General: Normal range of motion.     Cervical back: Normal range of motion.  Skin:    General: Skin is warm and dry.  Neurological:     Mental Status: Jonathan Alexander is alert.  Psychiatric:        Mood and Affect: Mood normal.        Thought Content: Thought content normal.        Judgment: Judgment normal.      Assessment/Plan Continue bilateral maintenance  [name], MD 12/25/2022, 11:20 AM

## 2023-01-03 ENCOUNTER — Other Ambulatory Visit: Payer: 59

## 2023-01-04 LAB — COMPREHENSIVE METABOLIC PANEL
ALT: 9 IU/L (ref 0–44)
AST: 12 IU/L (ref 0–40)
Albumin/Globulin Ratio: 2 (ref 1.2–2.2)
Albumin: 4.3 g/dL (ref 4.1–5.1)
Alkaline Phosphatase: 111 IU/L (ref 44–121)
BUN/Creatinine Ratio: 9 (ref 9–20)
BUN: 10 mg/dL (ref 6–20)
Bilirubin Total: 0.2 mg/dL (ref 0.0–1.2)
CO2: 24 mmol/L (ref 20–29)
Calcium: 9.8 mg/dL (ref 8.7–10.2)
Chloride: 104 mmol/L (ref 96–106)
Creatinine, Ser: 1.08 mg/dL (ref 0.76–1.27)
Globulin, Total: 2.2 g/dL (ref 1.5–4.5)
Glucose: 100 mg/dL — ABNORMAL HIGH (ref 70–99)
Potassium: 4.4 mmol/L (ref 3.5–5.2)
Sodium: 143 mmol/L (ref 134–144)
Total Protein: 6.5 g/dL (ref 6.0–8.5)
eGFR: 90 mL/min/{1.73_m2} (ref >59)

## 2023-01-04 LAB — LIPID PANEL
Chol/HDL Ratio: 3.5 ratio (ref 0.0–5.0)
Cholesterol, Total: 106 mg/dL (ref 100–199)
HDL: 30 mg/dL — ABNORMAL LOW (ref >39)
LDL Chol Calc (NIH): 43 mg/dL (ref 0–99)
Triglycerides: 203 mg/dL — ABNORMAL HIGH (ref 0–149)
VLDL Cholesterol Cal: 33 mg/dL (ref 5–40)

## 2023-01-04 LAB — HEMOGLOBIN A1C
Est. average glucose Bld gHb Est-mCnc: 103 mg/dL
Hgb A1c MFr Bld: 5.2 % (ref 4.8–5.6)

## 2023-01-04 LAB — CBC WITH DIFF/PLATELET
Basophils Absolute: 0 10*3/uL (ref 0.0–0.2)
Basos: 0 %
EOS (ABSOLUTE): 0 10*3/uL (ref 0.0–0.4)
Eos: 0 %
Hematocrit: 38.5 % (ref 37.5–51.0)
Hemoglobin: 12.6 g/dL — ABNORMAL LOW (ref 13.0–17.7)
Immature Grans (Abs): 0 10*3/uL (ref 0.0–0.1)
Immature Granulocytes: 0 %
Lymphocytes Absolute: 2.5 10*3/uL (ref 0.7–3.1)
Lymphs: 23 %
MCH: 30.1 pg (ref 26.6–33.0)
MCHC: 32.7 g/dL (ref 31.5–35.7)
MCV: 92 fL (ref 79–97)
Monocytes Absolute: 0.8 10*3/uL (ref 0.1–0.9)
Monocytes: 7 %
Neutrophils Absolute: 7.2 10*3/uL — ABNORMAL HIGH (ref 1.4–7.0)
Neutrophils: 70 %
Platelets: 323 10*3/uL (ref 150–450)
RBC: 4.18 x10E6/uL (ref 4.14–5.80)
RDW: 12.4 % (ref 11.6–15.4)
WBC: 10.5 10*3/uL (ref 3.4–10.8)

## 2023-01-07 ENCOUNTER — Ambulatory Visit: Payer: 59 | Admitting: Internal Medicine

## 2023-01-10 ENCOUNTER — Ambulatory Visit: Payer: Self-pay | Admitting: Anesthesiology

## 2023-01-10 ENCOUNTER — Other Ambulatory Visit: Payer: Self-pay | Admitting: Psychiatry

## 2023-01-10 ENCOUNTER — Ambulatory Visit
Admission: RE | Admit: 2023-01-10 | Discharge: 2023-01-10 | Disposition: A | Discharge status: Home / Self Care | Payer: 59 | Source: Ambulatory Visit | Attending: Psychiatry | Admitting: Psychiatry

## 2023-01-10 ENCOUNTER — Encounter: Payer: Self-pay | Admitting: Anesthesiology

## 2023-01-10 DIAGNOSIS — F259 Schizoaffective disorder, unspecified: Secondary | ICD-10-CM | POA: Diagnosis not present

## 2023-01-10 DIAGNOSIS — R569 Unspecified convulsions: Secondary | ICD-10-CM | POA: Diagnosis not present

## 2023-01-10 MED ORDER — PROPOFOL 10 MG/ML IV BOLUS
INTRAVENOUS | Status: AC
  Filled 2023-01-10: qty 20

## 2023-01-10 MED ORDER — MIDAZOLAM HCL 2 MG/2ML IJ SOLN
INTRAMUSCULAR | Status: AC
  Filled 2023-01-10: qty 2

## 2023-01-10 MED ORDER — KETOROLAC TROMETHAMINE 30 MG/ML IJ SOLN: 30.0000 mg | Freq: Once | INTRAMUSCULAR | Status: DC

## 2023-01-10 MED ORDER — KETAMINE HCL 50 MG/5ML IJ SOSY
PREFILLED_SYRINGE | INTRAMUSCULAR | Status: AC
  Filled 2023-01-10: qty 5

## 2023-01-10 MED ORDER — ONDANSETRON HCL 4 MG/2ML IJ SOLN: 4.0000 mg | Freq: Once | INTRAMUSCULAR | Status: DC | PRN

## 2023-01-10 MED ORDER — KETAMINE HCL 10 MG/ML IJ SOLN
INTRAMUSCULAR | Status: DC | PRN
  Administered 2023-01-10: 80 mg via INTRAVENOUS

## 2023-01-10 MED ORDER — SUCCINYLCHOLINE CHLORIDE 200 MG/10ML IV SOSY
PREFILLED_SYRINGE | INTRAVENOUS | Status: DC | PRN
  Administered 2023-01-10: 100 mg via INTRAVENOUS

## 2023-01-10 MED ORDER — MIDAZOLAM HCL 2 MG/2ML IJ SOLN
INTRAMUSCULAR | Status: DC | PRN
  Administered 2023-01-10: 2 mg via INTRAVENOUS

## 2023-01-10 MED ORDER — GLYCOPYRROLATE 0.2 MG/ML IJ SOLN: 0.3000 mg | Freq: Once | INTRAMUSCULAR | Status: DC

## 2023-01-10 MED ORDER — ACETAMINOPHEN 325 MG PO TABS: 650.0000 mg | ORAL_TABLET | Freq: Once | ORAL | Status: DC | PRN

## 2023-01-10 MED ORDER — PROPOFOL 10 MG/ML IV BOLUS
INTRAVENOUS | Status: DC | PRN
  Administered 2023-01-10: 60 mg via INTRAVENOUS

## 2023-01-10 MED ORDER — SODIUM CHLORIDE 0.9 % IV SOLN: 500.0000 mL | Freq: Once | INTRAVENOUS | Status: DC

## 2023-01-10 MED ORDER — ACETAMINOPHEN 160 MG/5ML PO SOLN: 325.0000 mg | ORAL | Status: DC | PRN

## 2023-01-10 NOTE — H&P (Signed)
Jonathan Alexander. is an 37 y.o. male.   Chief Complaint: Patient seen and chart reviewed.  37 year old man with schizoaffective disorder well-known to the ECT service here for scheduled maintenance treatment.  Patient has no complaints. HPI: History of schizoaffective disorder with good response to ECT.  Managed regularly with maintenance ECT.  Currently stable.  Patient was pleasant and calm.  Able to discuss with [place] that he is looking forward to a visit with his family soon.  Past Medical History:  Diagnosis Date   Schizophrenia (HCC)    Tachycardia     History reviewed. No pertinent surgical history.  Family History  Family history unknown: Yes   Social History:  reports that he has been smoking cigarettes. He has a 4.00 pack-year smoking history. He has never used smokeless tobacco. He reports that he does not drink alcohol and does not use drugs.  Allergies:  Allergies  Allergen Reactions   Divalproex Sodium    Shellfish-Derived Products     (Not in a hospital admission)   No results found for this or any previous visit (from the past 48 hour(s)). No results found.  Review of Systems  Constitutional: Negative.   HENT: Negative.    Eyes: Negative.   Respiratory: Negative.    Cardiovascular: Negative.   Gastrointestinal: Negative.   Musculoskeletal: Negative.   Skin: Negative.   Neurological: Negative.   Psychiatric/Behavioral: Negative.  Negative for agitation.     Blood pressure 126/81, pulse 89, temperature 98.4 F (36.9 C), temperature source Oral, resp. rate 18, SpO2 98 %. Physical Exam Vitals reviewed.  Constitutional:      Appearance: He is well-developed.  HENT:     Head: Normocephalic and atraumatic.  Eyes:     Conjunctiva/sclera: Conjunctivae normal.     Pupils: Pupils are equal, round, and reactive to light.  Cardiovascular:     Heart sounds: Normal heart sounds.  Pulmonary:     Effort: Pulmonary effort is normal.  Abdominal:     Palpations:  Abdomen is soft.  Musculoskeletal:        General: Normal range of motion.     Cervical back: Normal range of motion.  Skin:    General: Skin is warm and dry.  Neurological:     Mental Status: He is alert.  Psychiatric:        Attention and Perception: Attention normal.        Mood and Affect: Mood normal.        Speech: Speech normal.        Behavior: Behavior normal.        Thought Content: Thought content normal.        Cognition and Memory: Cognition normal.        Judgment: Judgment normal.      Assessment/Plan We are doing this maintenance treatment a little bit sooner than normal because of the time limitations.  Our ECT service will be stopping operation 1 week from today and we want to make sure the patient is doing well out into the future.  Treatment today.  We are working on referral to follow-up at alternant services as well.  [name], MD 01/10/2023, 12:21 PM

## 2023-01-10 NOTE — Procedures (Signed)
ECT SERVICES Physician's Interval Evaluation & Treatment Note  Patient Identification: Jonathan Alexander. MRN:  [id number] Date of Evaluation:  01/10/2023 TX #: [id number]  MADRS:   MMSE:   P.E. Findings:  Unremarkable no change to physical exam  Psychiatric Interval Note:  No complaints  Subjective:  Patient is a 37 y.o. male seen for evaluation for Electroconvulsive Therapy. No complaint  Treatment Summary:   []   Right Unilateral             [x]  Bilateral   % Energy : 1.0 ms 60%   Impedance: 1410 ohms  Seizure Energy Index: 4465 V squared  Postictal Suppression Index: 81%  Seizure Concordance Index: 84%  Medications  Pre Shock: Robinul 0.2 mg ketamine 80 mg propofol 60 mg succinylcholine 100 mg  Post Shock: Versed 2 mg  Seizure Duration: 18 seconds EMG 44 seconds EEG   Comments: Last treatment here at our facility.  We will work on pursuing referral to other facilities as well.  Lungs:  [x]   Clear to auscultation               []  Other:   Heart:    [x]   Regular rhythm             []  irregular rhythm    [x]   Previous H&P reviewed, patient examined and there are NO CHANGES                 []   Previous H&P reviewed, patient examined and there are changes noted.   [name], MD 6/7/20243:46 PM

## 2023-01-10 NOTE — Anesthesia Preprocedure Evaluation (Signed)
Anesthesia Evaluation  Patient identified by MRN, date of birth, ID band Patient awake    Reviewed: Allergy & Precautions, NPO status , Patient's Chart, lab work & pertinent test results  History of Anesthesia Complications Negative for: history of anesthetic complications  Airway Mallampati: III   Neck ROM: Full    Dental  (+) Missing, Loose   Pulmonary Current Smoker and Patient abstained from smoking.   Pulmonary exam normal breath sounds clear to auscultation       Cardiovascular hypertension, Normal cardiovascular exam Rhythm:Regular Rate:Normal     Neuro/Psych  PSYCHIATRIC DISORDERS   Bipolar Disorder Schizophrenia  negative neurological ROS     GI/Hepatic negative GI ROS,,,  Endo/Other  Obesity, prediabetes  Renal/GU negative Renal ROS     Musculoskeletal   Abdominal   Peds  Hematology negative hematology ROS (+)   Anesthesia Other Findings   Reproductive/Obstetrics                             Anesthesia Physical Anesthesia Plan  ASA: 2  Anesthesia Plan: General   Post-op Pain Management:    Induction: Intravenous  PONV Risk Score and Plan: 1 and TIVA and Treatment may vary due to age or medical condition  Airway Management Planned: Natural Airway  Additional Equipment:   Intra-op Plan:   Post-operative Plan:   Informed Consent: I have reviewed the patients History and Physical, chart, labs and discussed the procedure including the risks, benefits and alternatives for the proposed anesthesia with the patient or authorized representative who has indicated his/her understanding and acceptance.       Plan Discussed with: CRNA  Anesthesia Plan Comments: (Serial consent on chart. LMA/GETA backup discussed.  Patient consented for risks of anesthesia including but not limited to:  - adverse reactions to medications - damage to eyes, teeth, lips or other oral mucosa -  nerve damage due to positioning  - sore throat or hoarseness - damage to heart, brain, nerves, lungs, other parts of body or loss of life  Informed patient about role of CRNA in peri- and intra-operative care.  Patient voiced understanding.)       Anesthesia Quick Evaluation  

## 2023-01-10 NOTE — Transfer of Care (Signed)
Immediate Anesthesia Transfer of Care Note  Patient: Jonathan Alexander.  Procedure(s) Performed: ECT TX  Patient Location: PACU  Anesthesia Type:General  Level of Consciousness: drowsy  Airway & Oxygen Therapy: Patient Spontanous Breathing and Patient connected to face mask oxygen  Post-op Assessment: Report given to RN and Post -op Vital signs reviewed and stable  Post vital signs: stable  Last Vitals:  Vitals Value Taken Time  BP 121/67 01/10/23 1335  Temp 36.7 C 01/10/23 1335  Pulse 106 01/10/23 1338  Resp 32 01/10/23 1337  SpO2 100 % 01/10/23 1338  Vitals shown include unvalidated device data.  Last Pain:  Vitals:   01/10/23 1143  TempSrc:   PainSc: 0-No pain         Complications: No notable events documented.

## 2023-01-13 ENCOUNTER — Other Ambulatory Visit: Payer: Self-pay | Admitting: Internal Medicine

## 2023-01-13 NOTE — Anesthesia Postprocedure Evaluation (Signed)
Anesthesia Post Note  Patient: Jonathan Alexander.  Procedure(s) Performed: ECT TX  Patient location during evaluation: PACU Anesthesia Type: General Level of consciousness: awake and alert, oriented and patient cooperative Pain management: pain level controlled Vital Signs Assessment: post-procedure vital signs reviewed and stable Respiratory status: spontaneous breathing, nonlabored ventilation and respiratory function stable Cardiovascular status: blood pressure returned to baseline and stable Postop Assessment: adequate PO intake Anesthetic complications: no   There were no known notable events for this encounter.   Last Vitals:  Vitals:   01/10/23 1410 01/10/23 1415  BP: (!) 135/93   Pulse: (!) 109 (!) 50  Resp: 20 17  Temp: 36.7 C   SpO2: 99% (!) 75%    Last Pain:  Vitals:   01/10/23 1410  TempSrc:   PainSc: 0-No pain                 Reed Breech

## 2023-01-28 ENCOUNTER — Ambulatory Visit (INDEPENDENT_AMBULATORY_CARE_PROVIDER_SITE_OTHER): Payer: 59 | Admitting: Internal Medicine

## 2023-01-28 VITALS — BP 114/62 | HR 96 | Ht 70.0 in | Wt 222.8 lb

## 2023-01-28 DIAGNOSIS — E8881 Metabolic syndrome: Secondary | ICD-10-CM | POA: Diagnosis not present

## 2023-01-28 DIAGNOSIS — Z6832 Body mass index (BMI) 32.0-32.9, adult: Secondary | ICD-10-CM

## 2023-01-28 DIAGNOSIS — E669 Obesity, unspecified: Secondary | ICD-10-CM

## 2023-01-28 DIAGNOSIS — E6609 Other obesity due to excess calories: Secondary | ICD-10-CM | POA: Diagnosis not present

## 2023-02-03 ENCOUNTER — Other Ambulatory Visit: Payer: Self-pay | Admitting: Internal Medicine

## 2023-02-03 DIAGNOSIS — E669 Obesity, unspecified: Secondary | ICD-10-CM | POA: Insufficient documentation

## 2023-02-03 NOTE — Progress Notes (Signed)
Established Patient Office Visit  Subjective:  Patient ID: Jonathan Lapa., male    DOB: 1985-10-21  Age: 37 y.o. MRN: [id number]  Chief Complaint  Patient presents with   Follow-up    3months follow up FL2    No new complaints, here for lab review and medication refills. Moods stable and compliant with ECT treatments. LDL and TC well controlled on lab review. Triglycerides elevated with fasting glucose. No longer using eyedrops.     No other concerns at this time.   Past Medical History:  Diagnosis Date   Schizophrenia (HCC)    Tachycardia     No past surgical history on file.  Social History   Socioeconomic History   Marital status: Single    Spouse name: Not on file   Number of children: Not on file   Years of education: Not on file   Highest education level: Not on file  Occupational History   Not on file  Tobacco Use   Smoking status: Every Day    Packs/day: 1.00    Years: 4.00    Additional pack years: 0.00    Total pack years: 4.00    Types: Cigarettes   Smokeless tobacco: Never  Substance and Sexual Activity   Alcohol use: No   Drug use: No   Sexual activity: Never  Other Topics Concern   Not on file  Social History Narrative   Not on file   Social Determinants of Health   Financial Resource Strain: Not on file  Food Insecurity: Not on file  Transportation Needs: Not on file  Physical Activity: Not on file  Stress: Not on file  Social Connections: Not on file  Intimate Partner Violence: Not on file    Family History  Family history unknown: Yes    Allergies  Allergen Reactions   Divalproex Sodium    Shellfish-Derived Products     Review of Systems  All other systems reviewed and are negative.      Objective:   BP 114/62   Pulse 96   Ht 5\' 10"  (1.778 m)   Wt 222 lb 12.8 oz (101.1 kg)   SpO2 97%   BMI 31.97 kg/m   Vitals:   01/28/23 1433  BP: 114/62  Pulse: 96  Height: 5\' 10"  (1.778 m)  Weight: 222 lb 12.8 oz  (101.1 kg)  SpO2: 97%  BMI (Calculated): 31.97    Physical Exam Vitals reviewed.  Constitutional:      Appearance: Normal appearance. He is obese.  HENT:     Head: Normocephalic and atraumatic.     Left Ear: There is no impacted cerumen.     Nose: Nose normal.     Mouth/Throat:     Mouth: Mucous membranes are moist.     Pharynx: No posterior oropharyngeal erythema.  Eyes:     Extraocular Movements: Extraocular movements intact.     Pupils: Pupils are equal, round, and reactive to light.  Cardiovascular:     Rate and Rhythm: Regular rhythm.     Chest Wall: PMI is not displaced.     Pulses: Normal pulses.     Heart sounds: Normal heart sounds. No murmur heard. Pulmonary:     Effort: Pulmonary effort is normal.     Breath sounds: Normal air entry. No rhonchi or rales.  Abdominal:     General: Abdomen is flat. Bowel sounds are normal. There is no distension.     Palpations: Abdomen is soft. There is no  hepatomegaly, splenomegaly or mass.     Tenderness: There is no abdominal tenderness.  Musculoskeletal:        General: Normal range of motion.     Cervical back: Normal range of motion and neck supple.     Right lower leg: No edema.     Left lower leg: No edema.  Skin:    General: Skin is warm and dry.  Neurological:     General: No focal deficit present.     Mental Status: He is alert and oriented to person, place, and time.     Cranial Nerves: No cranial nerve deficit.     Motor: No weakness.  Psychiatric:        Mood and Affect: Mood normal.        Behavior: Behavior normal.      No results found for any visits on 01/28/23.  Recent Results (from the past 2160 hour(Abdulrahim Siddiqi))  CBC With Diff/Platelet     Status: Abnormal   Collection Time: 01/03/23  2:01 PM  Result Value Ref Range   WBC 10.5 3.4 - 10.8 x10E3/uL   RBC 4.18 4.14 - 5.80 x10E6/uL   Hemoglobin 12.6 (L) 13.0 - 17.7 g/dL   Hematocrit 16.1 09.6 - 51.0 %   MCV 92 79 - 97 fL   MCH 30.1 26.6 - 33.0 pg   MCHC  32.7 31.5 - 35.7 g/dL   RDW 04.5 40.9 - 81.1 %   Platelets 323 150 - 450 x10E3/uL   Neutrophils 70 Not Estab. %   Lymphs 23 Not Estab. %   Monocytes 7 Not Estab. %   Eos 0 Not Estab. %   Basos 0 Not Estab. %   Neutrophils Absolute 7.2 (H) 1.4 - 7.0 x10E3/uL   Lymphocytes Absolute 2.5 0.7 - 3.1 x10E3/uL   Monocytes Absolute 0.8 0.1 - 0.9 x10E3/uL   EOS (ABSOLUTE) 0.0 0.0 - 0.4 x10E3/uL   Basophils Absolute 0.0 0.0 - 0.2 x10E3/uL   Immature Granulocytes 0 Not Estab. %   Immature Grans (Abs) 0.0 0.0 - 0.1 x10E3/uL  Comprehensive metabolic panel     Status: Abnormal   Collection Time: 01/03/23  2:01 PM  Result Value Ref Range   Glucose 100 (H) 70 - 99 mg/dL   BUN 10 6 - 20 mg/dL   Creatinine, Ser 9.14 0.76 - 1.27 mg/dL   eGFR 90 >78 GN/FAO/1.30   BUN/Creatinine Ratio 9 9 - 20   Sodium 143 134 - 144 mmol/L   Potassium 4.4 3.5 - 5.2 mmol/L   Chloride 104 96 - 106 mmol/L   CO2 24 20 - 29 mmol/L   Calcium 9.8 8.7 - 10.2 mg/dL   Total Protein 6.5 6.0 - 8.5 g/dL   Albumin 4.3 4.1 - 5.1 g/dL   Globulin, Total 2.2 1.5 - 4.5 g/dL   Albumin/Globulin Ratio 2.0 1.2 - 2.2   Bilirubin Total 0.2 0.0 - 1.2 mg/dL   Alkaline Phosphatase 111 44 - 121 IU/L   AST 12 0 - 40 IU/L   ALT 9 0 - 44 IU/L  Lipid panel     Status: Abnormal   Collection Time: 01/03/23  2:01 PM  Result Value Ref Range   Cholesterol, Total 106 100 - 199 mg/dL   Triglycerides 865 (H) 0 - 149 mg/dL   HDL 30 (L) >78 mg/dL   VLDL Cholesterol Cal 33 5 - 40 mg/dL   LDL Chol Calc (NIH) 43 0 - 99 mg/dL   Chol/HDL Ratio 3.5 0.0 -  5.0 ratio    Comment:                                   T. Chol/HDL Ratio                                             Men  Women                               1/2 Avg.Risk  3.4    3.3                                   Avg.Risk  5.0    4.4                                2X Avg.Risk  9.6    7.1                                3X Avg.Risk 23.4   11.0   Hemoglobin A1c     Status: None   Collection Time:  01/03/23  2:01 PM  Result Value Ref Range   Hgb A1c MFr Bld 5.2 4.8 - 5.6 %    Comment:          Prediabetes: 5.7 - 6.4          Diabetes: >6.4          Glycemic control for adults with diabetes: <7.0    Est. average glucose Bld gHb Est-mCnc 103 mg/dL      Assessment & Plan:  As per problem list. Dc eye drops. Stricter low calorie diet, low cholesterol and low fat diet and exercise as much as possible.  Problem List Items Addressed This Visit   None Visit Diagnoses     Class 1 obesity due to excess calories without serious comorbidity with body mass index (BMI) of 32.0 to 32.9 in adult    -  Primary   Relevant Orders   Comprehensive metabolic panel   Lipid panel   Hemoglobin A1c       Return in about 3 months (around 04/30/2023) for fu with labs prior.   Total time spent: 20 minutes  Luna Fuse, MD  01/28/2023   This document may have been prepared by [facility] Voice Recognition software and as such may include unintentional dictation errors.

## 2023-02-07 ENCOUNTER — Other Ambulatory Visit: Payer: Self-pay | Admitting: Internal Medicine

## 2023-02-07 DIAGNOSIS — E669 Obesity, unspecified: Secondary | ICD-10-CM

## 2023-04-14 ENCOUNTER — Other Ambulatory Visit: Payer: Self-pay

## 2023-04-14 MED ORDER — EPINEPHRINE 0.3 MG/0.3ML IJ SOAJ: 0.3000 mg | INTRAMUSCULAR | 0 refills | Status: DC | PRN

## 2023-04-25 ENCOUNTER — Other Ambulatory Visit: Payer: Self-pay

## 2023-04-25 DIAGNOSIS — E6609 Other obesity due to excess calories: Secondary | ICD-10-CM

## 2023-04-26 LAB — COMPREHENSIVE METABOLIC PANEL
ALT: 8 IU/L (ref 0–44)
AST: 12 IU/L (ref 0–40)
Albumin: 4.5 g/dL (ref 4.1–5.1)
Alkaline Phosphatase: 108 IU/L (ref 44–121)
BUN/Creatinine Ratio: 9 (ref 9–20)
BUN: 9 mg/dL (ref 6–20)
Bilirubin Total: 0.3 mg/dL (ref 0.0–1.2)
CO2: 24 mmol/L (ref 20–29)
Calcium: 10.2 mg/dL (ref 8.7–10.2)
Chloride: 102 mmol/L (ref 96–106)
Creatinine, Ser: 1.03 mg/dL (ref 0.76–1.27)
Globulin, Total: 2.1 g/dL (ref 1.5–4.5)
Glucose: 83 mg/dL (ref 70–99)
Potassium: 4.6 mmol/L (ref 3.5–5.2)
Sodium: 139 mmol/L (ref 134–144)
Total Protein: 6.6 g/dL (ref 6.0–8.5)
eGFR: 95 mL/min/{1.73_m2} (ref >59)

## 2023-04-26 LAB — LIPID PANEL
Chol/HDL Ratio: 3.1 ratio (ref 0.0–5.0)
Cholesterol, Total: 100 mg/dL (ref 100–199)
HDL: 32 mg/dL — ABNORMAL LOW (ref >39)
LDL Chol Calc (NIH): 35 mg/dL (ref 0–99)
Triglycerides: 210 mg/dL — ABNORMAL HIGH (ref 0–149)
VLDL Cholesterol Cal: 33 mg/dL (ref 5–40)

## 2023-04-26 LAB — HEMOGLOBIN A1C
Est. average glucose Bld gHb Est-mCnc: 105 mg/dL
Hgb A1c MFr Bld: 5.3 % (ref 4.8–5.6)

## 2023-04-29 ENCOUNTER — Encounter: Payer: Self-pay | Admitting: Internal Medicine

## 2023-04-29 ENCOUNTER — Ambulatory Visit (INDEPENDENT_AMBULATORY_CARE_PROVIDER_SITE_OTHER): Payer: 59 | Admitting: Internal Medicine

## 2023-04-29 VITALS — BP 135/70 | HR 95 | Ht 72.0 in | Wt 212.8 lb

## 2023-04-29 DIAGNOSIS — Z6832 Body mass index (BMI) 32.0-32.9, adult: Secondary | ICD-10-CM | POA: Diagnosis not present

## 2023-04-29 DIAGNOSIS — J301 Allergic rhinitis due to pollen: Secondary | ICD-10-CM

## 2023-04-29 DIAGNOSIS — E6609 Other obesity due to excess calories: Secondary | ICD-10-CM | POA: Diagnosis not present

## 2023-04-29 DIAGNOSIS — E66811 Obesity, class 1: Secondary | ICD-10-CM

## 2023-04-29 MED ORDER — FLUTICASONE PROPIONATE 50 MCG/ACT NA SUSP
1.0000 | Freq: Every day | NASAL | 2 refills | Status: DC
Start: 2023-04-29 — End: 2023-11-13

## 2023-04-29 MED ORDER — BENZONATATE 100 MG PO CAPS
100.0000 mg | ORAL_CAPSULE | Freq: Three times a day (TID) | ORAL | 0 refills | Status: AC | PRN
Start: 2023-04-29 — End: 2023-05-09

## 2023-04-29 NOTE — Progress Notes (Signed)
Established Patient Office Visit  Subjective:  Patient ID: Jonathan Sump., male    DOB: Jan 31, 1986  Age: 37 y.o. MRN: [id number]  No chief complaint on file.   No new complaints, here for lab review and medication refills. LDL and TC well controlled on lab review. Triglycerides also satisfactory as is A1c at 5.3. Lost 10 lbs with exercise.  No other concerns at this time.   Past Medical History:  Diagnosis Date   Schizophrenia (HCC)    Tachycardia     No past surgical history on file.  Social History   Socioeconomic History   Marital status: Single    Spouse name: Not on file   Number of children: Not on file   Years of education: Not on file   Highest education level: Not on file  Occupational History   Not on file  Tobacco Use   Smoking status: Every Day    Current packs/day: 1.00    Average packs/day: 1 pack/day for 4.0 years (4.0 ttl pk-yrs)    Types: Cigarettes   Smokeless tobacco: Never  Substance and Sexual Activity   Alcohol use: No   Drug use: No   Sexual activity: Never  Other Topics Concern   Not on file  Social History Narrative   Not on file   Social Determinants of Health   Financial Resource Strain: Not on file  Food Insecurity: Not on file  Transportation Needs: Not on file  Physical Activity: Not on file  Stress: Not on file  Social Connections: Not on file  Intimate Partner Violence: Not on file    Family History  Family history unknown: Yes    Allergies  Allergen Reactions   Divalproex Sodium    Shellfish-Derived Products     Review of Systems  Constitutional: Negative.   HENT: Negative.    Eyes: Negative.   Respiratory: Negative.    Cardiovascular: Negative.   Gastrointestinal: Negative.   Genitourinary: Negative.   Skin: Negative.   Neurological: Negative.   Endo/Heme/Allergies: Negative.        Objective:   BP 135/70   Pulse 95   Ht 6' (1.829 m)   Wt 212 lb 12.8 oz (96.5 kg)   SpO2 97%   BMI 28.86  kg/m   Vitals:   04/29/23 1448  BP: 135/70  Pulse: 95  Height: 6' (1.829 m)  Weight: 212 lb 12.8 oz (96.5 kg)  SpO2: 97%  BMI (Calculated): 28.85    Physical Exam Vitals reviewed.  Constitutional:      Appearance: Normal appearance. He is obese.  HENT:     Head: Normocephalic and atraumatic.     Left Ear: There is no impacted cerumen.     Nose: Nose normal.     Mouth/Throat:     Mouth: Mucous membranes are moist.     Pharynx: No posterior oropharyngeal erythema.  Eyes:     Extraocular Movements: Extraocular movements intact.     Pupils: Pupils are equal, round, and reactive to light.  Cardiovascular:     Rate and Rhythm: Regular rhythm.     Chest Wall: PMI is not displaced.     Pulses: Normal pulses.     Heart sounds: Normal heart sounds. No murmur heard. Pulmonary:     Effort: Pulmonary effort is normal.     Breath sounds: Normal air entry. No rhonchi or rales.  Abdominal:     General: Abdomen is flat. Bowel sounds are normal. There is no distension.  Palpations: Abdomen is soft. There is no hepatomegaly, splenomegaly or mass.     Tenderness: There is no abdominal tenderness.  Musculoskeletal:        General: Normal range of motion.     Cervical back: Normal range of motion and neck supple.     Right lower leg: No edema.     Left lower leg: No edema.  Skin:    General: Skin is warm and dry.  Neurological:     General: No focal deficit present.     Mental Status: He is alert and oriented to person, place, and time.     Cranial Nerves: No cranial nerve deficit.     Motor: No weakness.  Psychiatric:        Mood and Affect: Mood normal.        Behavior: Behavior normal.      No results found for any visits on 04/29/23.  Recent Results (from the past 2160 hour(Zamirah Denny))  Hemoglobin A1c     Status: None   Collection Time: 04/25/23 11:54 AM  Result Value Ref Range   Hgb A1c MFr Bld 5.3 4.8 - 5.6 %    Comment:          Prediabetes: 5.7 - 6.4          Diabetes:  >6.4          Glycemic control for adults with diabetes: <7.0    Est. average glucose Bld gHb Est-mCnc 105 mg/dL  Lipid panel     Status: Abnormal   Collection Time: 04/25/23 11:54 AM  Result Value Ref Range   Cholesterol, Total 100 100 - 199 mg/dL   Triglycerides 130 (H) 0 - 149 mg/dL   HDL 32 (L) >86 mg/dL   VLDL Cholesterol Cal 33 5 - 40 mg/dL   LDL Chol Calc (NIH) 35 0 - 99 mg/dL   Chol/HDL Ratio 3.1 0.0 - 5.0 ratio    Comment:                                   T. Chol/HDL Ratio                                             Men  Women                               1/2 Avg.Risk  3.4    3.3                                   Avg.Risk  5.0    4.4                                2X Avg.Risk  9.6    7.1                                3X Avg.Risk 23.4   11.0   Comprehensive metabolic panel     Status: None   Collection Time: 04/25/23 11:54 AM  Result Value Ref Range   Glucose 83  70 - 99 mg/dL   BUN 9 6 - 20 mg/dL   Creatinine, Ser 6.21 0.76 - 1.27 mg/dL   eGFR 95 >30 QM/VHQ/4.69   BUN/Creatinine Ratio 9 9 - 20   Sodium 139 134 - 144 mmol/L   Potassium 4.6 3.5 - 5.2 mmol/L   Chloride 102 96 - 106 mmol/L   CO2 24 20 - 29 mmol/L   Calcium 10.2 8.7 - 10.2 mg/dL   Total Protein 6.6 6.0 - 8.5 g/dL   Albumin 4.5 4.1 - 5.1 g/dL   Globulin, Total 2.1 1.5 - 4.5 g/dL   Bilirubin Total 0.3 0.0 - 1.2 mg/dL   Alkaline Phosphatase 108 44 - 121 IU/L   AST 12 0 - 40 IU/L   ALT 8 0 - 44 IU/L      Assessment & Plan:  As per problem list and release standing lab orders. Problem List Items Addressed This Visit   None Visit Diagnoses     Seasonal allergic rhinitis due to pollen    -  Primary   Relevant Medications   fluticasone (FLONASE) 50 MCG/ACT nasal spray   benzonatate (TESSALON PERLES) 100 MG capsule       Return in about 3 months (around 07/29/2023).   Total time spent: 20 minutes  Jonathan Fuse, MD  04/29/2023   This document may have been prepared by [facility]  Voice Recognition software and as such may include unintentional dictation errors.

## 2023-05-02 ENCOUNTER — Ambulatory Visit: Payer: 59 | Admitting: Internal Medicine

## 2023-06-06 LAB — LIPID PANEL
Chol/HDL Ratio: 2.9 {ratio} (ref 0.0–5.0)
Cholesterol, Total: 110 mg/dL (ref 100–199)
HDL: 38 mg/dL — ABNORMAL LOW (ref >39)
LDL Chol Calc (NIH): 48 mg/dL (ref 0–99)
Triglycerides: 139 mg/dL (ref 0–149)
VLDL Cholesterol Cal: 24 mg/dL (ref 5–40)

## 2023-06-06 LAB — COMPREHENSIVE METABOLIC PANEL
ALT: 17 [IU]/L (ref 0–44)
AST: 14 [IU]/L (ref 0–40)
Albumin: 4.3 g/dL (ref 4.1–5.1)
Alkaline Phosphatase: 130 [IU]/L — ABNORMAL HIGH (ref 44–121)
BUN/Creatinine Ratio: 6 — ABNORMAL LOW (ref 9–20)
BUN: 6 mg/dL (ref 6–20)
Bilirubin Total: <0.2 mg/dL (ref 0.0–1.2)
CO2: 24 mmol/L (ref 20–29)
Calcium: 9.9 mg/dL (ref 8.7–10.2)
Chloride: 106 mmol/L (ref 96–106)
Creatinine, Ser: 0.98 mg/dL (ref 0.76–1.27)
Globulin, Total: 2.4 g/dL (ref 1.5–4.5)
Glucose: 94 mg/dL (ref 70–99)
Potassium: 4.3 mmol/L (ref 3.5–5.2)
Sodium: 144 mmol/L (ref 134–144)
Total Protein: 6.7 g/dL (ref 6.0–8.5)
eGFR: 101 mL/min/{1.73_m2} (ref >59)

## 2023-06-06 LAB — HEMOGLOBIN A1C
Est. average glucose Bld gHb Est-mCnc: 100 mg/dL
Hgb A1c MFr Bld: 5.1 % (ref 4.8–5.6)

## 2023-07-22 ENCOUNTER — Other Ambulatory Visit: Payer: Self-pay

## 2023-07-22 DIAGNOSIS — E6609 Other obesity due to excess calories: Secondary | ICD-10-CM

## 2023-07-26 LAB — COMPREHENSIVE METABOLIC PANEL
ALT: 9 [IU]/L (ref 0–44)
AST: 12 [IU]/L (ref 0–40)
Albumin: 4.3 g/dL (ref 4.1–5.1)
Alkaline Phosphatase: 121 [IU]/L (ref 44–121)
BUN/Creatinine Ratio: 7 — ABNORMAL LOW (ref 9–20)
BUN: 8 mg/dL (ref 6–20)
Bilirubin Total: 0.3 mg/dL (ref 0.0–1.2)
CO2: 23 mmol/L (ref 20–29)
Calcium: 10 mg/dL (ref 8.7–10.2)
Chloride: 105 mmol/L (ref 96–106)
Creatinine, Ser: 1.17 mg/dL (ref 0.76–1.27)
Globulin, Total: 2.3 g/dL (ref 1.5–4.5)
Glucose: 103 mg/dL — ABNORMAL HIGH (ref 70–99)
Potassium: 4.2 mmol/L (ref 3.5–5.2)
Sodium: 143 mmol/L (ref 134–144)
Total Protein: 6.6 g/dL (ref 6.0–8.5)
eGFR: 81 mL/min/{1.73_m2} (ref >59)

## 2023-07-26 LAB — LIPID PANEL
Chol/HDL Ratio: 3.3 {ratio} (ref 0.0–5.0)
Cholesterol, Total: 105 mg/dL (ref 100–199)
HDL: 32 mg/dL — ABNORMAL LOW (ref >39)
LDL Chol Calc (NIH): 35 mg/dL (ref 0–99)
Triglycerides: 246 mg/dL — ABNORMAL HIGH (ref 0–149)
VLDL Cholesterol Cal: 38 mg/dL (ref 5–40)

## 2023-07-26 LAB — HEMOGLOBIN A1C
Est. average glucose Bld gHb Est-mCnc: 103 mg/dL
Hgb A1c MFr Bld: 5.2 % (ref 4.8–5.6)

## 2023-08-01 ENCOUNTER — Ambulatory Visit: Payer: 59 | Admitting: Internal Medicine

## 2023-08-25 ENCOUNTER — Ambulatory Visit: Payer: 59 | Admitting: Internal Medicine

## 2023-10-27 ENCOUNTER — Other Ambulatory Visit: Payer: Self-pay

## 2023-11-11 ENCOUNTER — Other Ambulatory Visit: Payer: Self-pay | Admitting: Internal Medicine

## 2023-11-11 DIAGNOSIS — E669 Obesity, unspecified: Secondary | ICD-10-CM

## 2023-11-13 ENCOUNTER — Ambulatory Visit: Admitting: Cardiology

## 2023-11-13 ENCOUNTER — Other Ambulatory Visit: Payer: Self-pay

## 2023-11-13 ENCOUNTER — Encounter: Payer: Self-pay | Admitting: Cardiology

## 2023-11-13 VITALS — BP 98/68 | HR 92 | Ht 72.0 in | Wt 203.0 lb

## 2023-11-13 DIAGNOSIS — E669 Obesity, unspecified: Secondary | ICD-10-CM | POA: Diagnosis not present

## 2023-11-13 DIAGNOSIS — E8881 Metabolic syndrome: Secondary | ICD-10-CM

## 2023-11-13 DIAGNOSIS — Z131 Encounter for screening for diabetes mellitus: Secondary | ICD-10-CM

## 2023-11-13 DIAGNOSIS — Z13220 Encounter for screening for lipoid disorders: Secondary | ICD-10-CM

## 2023-11-13 DIAGNOSIS — I10 Essential (primary) hypertension: Secondary | ICD-10-CM | POA: Diagnosis not present

## 2023-11-13 DIAGNOSIS — J301 Allergic rhinitis due to pollen: Secondary | ICD-10-CM

## 2023-11-13 DIAGNOSIS — Z0130 Encounter for examination of blood pressure without abnormal findings: Secondary | ICD-10-CM

## 2023-11-13 DIAGNOSIS — Z1329 Encounter for screening for other suspected endocrine disorder: Secondary | ICD-10-CM

## 2023-11-13 MED ORDER — EPINEPHRINE 0.3 MG/0.3ML IJ SOAJ: 0.3000 mg | INTRAMUSCULAR | 2 refills | Status: AC | PRN

## 2023-11-13 MED ORDER — LORATADINE 10 MG PO TABS: 10.0000 mg | ORAL_TABLET | Freq: Every morning | ORAL | 11 refills | Status: AC

## 2023-11-13 MED ORDER — METOPROLOL SUCCINATE ER 100 MG PO TB24: 100.0000 mg | ORAL_TABLET | Freq: Every day | ORAL | 6 refills | Status: AC

## 2023-11-13 MED ORDER — ALBUTEROL SULFATE HFA 108 (90 BASE) MCG/ACT IN AERS: 2.0000 | INHALATION_SPRAY | Freq: Four times a day (QID) | RESPIRATORY_TRACT | 2 refills | Status: AC | PRN

## 2023-11-13 MED ORDER — ATORVASTATIN CALCIUM 10 MG PO TABS: 10.0000 mg | ORAL_TABLET | Freq: Every day | ORAL | 6 refills | Status: AC

## 2023-11-13 MED ORDER — METFORMIN HCL 500 MG PO TABS: 500.0000 mg | ORAL_TABLET | Freq: Two times a day (BID) | ORAL | 6 refills | Status: AC

## 2023-11-13 MED ORDER — FLUTICASONE PROPIONATE 50 MCG/ACT NA SUSP: 1.0000 | Freq: Every day | NASAL | 2 refills | Status: AC

## 2023-11-13 NOTE — Progress Notes (Signed)
 Established Patient Office Visit  Subjective:  Patient ID: Jonathan Buell., male    DOB: 1985-10-01  Age: 38 y.o. MRN: [id number]  Chief Complaint  Patient presents with   Follow-up    3 Months Follow Up * pt is moving to [place] and needs to be seen once last time    Patient in office for 3 month follow up. Patient moving soon to [place]. Patient has no new complaints today. Is fasting, will get lab work. Medications refilled to get him through until he establishes with a new provider in [place].  Patient continues to lose weight. May need less anti hypertensive medication as he continues to lose weight.     No other concerns at this time.   Past Medical History:  Diagnosis Date   Schizophrenia (HCC)    Tachycardia     History reviewed. No pertinent surgical history.  Social History   Socioeconomic History   Marital status: Single    Spouse name: Not on file   Number of children: Not on file   Years of education: Not on file   Highest education level: Not on file  Occupational History   Not on file  Tobacco Use   Smoking status: Every Day    Current packs/day: 1.00    Average packs/day: 1 pack/day for 4.0 years (4.0 ttl pk-yrs)    Types: Cigarettes   Smokeless tobacco: Never  Substance and Sexual Activity   Alcohol use: No   Drug use: No   Sexual activity: Never  Other Topics Concern   Not on file  Social History Narrative   Not on file   Social Drivers of Health   Financial Resource Strain: Not on file  Food Insecurity: Not on file  Transportation Needs: Not on file  Physical Activity: Not on file  Stress: Not on file  Social Connections: Not on file  Intimate Partner Violence: Not on file    Family History  Family history unknown: Yes    Allergies  Allergen Reactions   Divalproex Sodium    Shellfish-Derived Products     Outpatient Medications Prior to Visit  Medication Sig   clonazePAM (KLONOPIN) 0.5 MG tablet Take 1 tablet (0.5  mg total) by mouth 3 (three) times daily.   clozapine (CLOZARIL) 200 MG tablet Take 2 tablets (400 mg total) by mouth at bedtime.   desmopressin (DDAVP) 0.2 MG tablet TAKE 1 TABLET BY MOUTH EVERY MORNING   imipramine (TOFRANIL) 50 MG tablet Take 2 tablets (100 mg total) by mouth at bedtime.   lithium carbonate (ESKALITH) 450 MG CR tablet Take 1 tablet (450 mg total) by mouth every 12 (twelve) hours.   loratadine (ALLERGY RELIEF) 10 MG tablet Take 1 tablet (10 mg total) by mouth every morning.   OLANZapine (ZYPREXA) 10 MG tablet Take 10 mg by mouth daily.   OLANZapine (ZYPREXA) 20 MG tablet Take 1 tablet (20 mg total) by mouth at bedtime.   venlafaxine XR (EFFEXOR-XR) 75 MG 24 hr capsule Take 1 capsule (75 mg total) by mouth daily with breakfast.   [DISCONTINUED] albuterol (VENTOLIN HFA) 108 (90 Base) MCG/ACT inhaler Inhale 2 puffs into the lungs every 6 (six) hours as needed for wheezing or shortness of breath.   [DISCONTINUED] atorvastatin (LIPITOR) 10 MG tablet TAKE 1 TABLET BY MOUTH EVERY NIGHT AT BEDTIME   [DISCONTINUED] EPINEPHrine 0.3 mg/0.3 mL IJ SOAJ injection Inject 0.3 mg into the muscle as needed for anaphylaxis.   [DISCONTINUED] metFORMIN (GLUCOPHAGE) 500 MG  tablet TAKE 1 TABLET BY MOUTH TWICE DAILY *TAKE WITH FOOD*   [DISCONTINUED] metoprolol succinate (TOPROL-XL) 100 MG 24 hr tablet Take 1 tablet (100 mg total) by mouth daily.   ketotifen (ZADITOR) 0.025 % ophthalmic solution Place 2 drops into the left eye every 4 (four) hours as needed (for irritation of left eye). (Patient not taking: Reported on 11/13/2023)   [DISCONTINUED] ferrous sulfate 325 (65 FE) MG tablet Take 1 tablet (325 mg total) by mouth daily with breakfast. (Patient not taking: Reported on 11/13/2023)   [DISCONTINUED] fluticasone (FLONASE) 50 MCG/ACT nasal spray Place 1 spray into both nostrils daily.   No facility-administered medications prior to visit.    Review of Systems  Constitutional: Negative.   HENT:  Negative.    Eyes: Negative.   Respiratory: Negative.  Negative for shortness of breath.   Cardiovascular: Negative.  Negative for chest pain.  Gastrointestinal: Negative.  Negative for abdominal pain, constipation and diarrhea.  Genitourinary: Negative.   Musculoskeletal:  Negative for joint pain and myalgias.  Skin: Negative.   Neurological: Negative.  Negative for dizziness and headaches.  Endo/Heme/Allergies: Negative.   All other systems reviewed and are negative.      Objective:   BP 98/68   Pulse 92   Ht 6' (1.829 m)   Wt 203 lb (92.1 kg)   SpO2 96%   BMI 27.53 kg/m   Vitals:   11/13/23 0919  BP: 98/68  Pulse: 92  Height: 6' (1.829 m)  Weight: 203 lb (92.1 kg)  SpO2: 96%  BMI (Calculated): 27.53    Physical Exam Nursing note reviewed.  Constitutional:      Appearance: Normal appearance. He is normal weight.  HENT:     Head: Normocephalic and atraumatic.     Nose: Nose normal.     Mouth/Throat:     Mouth: Mucous membranes are moist.     Pharynx: Oropharynx is clear.  Eyes:     Extraocular Movements: Extraocular movements intact.     Conjunctiva/sclera: Conjunctivae normal.     Pupils: Pupils are equal, round, and reactive to light.  Cardiovascular:     Rate and Rhythm: Normal rate and regular rhythm.     Pulses: Normal pulses.     Heart sounds: Normal heart sounds.  Pulmonary:     Effort: Pulmonary effort is normal.     Breath sounds: Normal breath sounds.  Abdominal:     General: Abdomen is flat. Bowel sounds are normal.     Palpations: Abdomen is soft.  Musculoskeletal:        General: Normal range of motion.     Cervical back: Normal range of motion.  Skin:    General: Skin is warm and dry.  Neurological:     General: No focal deficit present.     Mental Status: He is alert and oriented to person, place, and time.  Psychiatric:        Mood and Affect: Mood normal.        Behavior: Behavior normal.        Thought Content: Thought content  normal.        Judgment: Judgment normal.      No results found for any visits on 11/13/23.  No results found for this or any previous visit (from the past 2160 hours).    Assessment & Plan:  Fasting lab work today.  Problem List Items Addressed This Visit       Cardiovascular and Mediastinum   Hypertension -  Primary   Relevant Medications   atorvastatin (LIPITOR) 10 MG tablet   metoprolol succinate (TOPROL-XL) 100 MG 24 hr tablet   EPINEPHrine 0.3 mg/0.3 mL IJ SOAJ injection   Other Relevant Orders   CMP14+EGFR     Other   Abdominal obesity and metabolic syndrome   Relevant Medications   atorvastatin (LIPITOR) 10 MG tablet   Other Visit Diagnoses       Diabetes mellitus screening       Relevant Orders   Hemoglobin A1c     Thyroid disorder screening         Lipid screening       Relevant Orders   Lipid Profile     Seasonal allergic rhinitis due to pollen       Relevant Medications   fluticasone (FLONASE) 50 MCG/ACT nasal spray       Return if symptoms worsen or fail to improve.   Total time spent: 25 minutes  [employer], NP  11/13/2023   This document may have been prepared by Dragon Voice Recognition software and as such may include unintentional dictation errors.

## 2023-11-14 LAB — CMP14+EGFR
ALT: 8 IU/L (ref 0–44)
AST: 13 IU/L (ref 0–40)
Albumin: 4.6 g/dL (ref 4.1–5.1)
Alkaline Phosphatase: 193 IU/L — ABNORMAL HIGH (ref 44–121)
BUN/Creatinine Ratio: 7 — ABNORMAL LOW (ref 9–20)
BUN: 10 mg/dL (ref 6–20)
Bilirubin Total: 0.3 mg/dL (ref 0.0–1.2)
CO2: 23 mmol/L (ref 20–29)
Calcium: 10 mg/dL (ref 8.7–10.2)
Chloride: 103 mmol/L (ref 96–106)
Creatinine, Ser: 1.36 mg/dL — ABNORMAL HIGH (ref 0.76–1.27)
Globulin, Total: 2.1 g/dL (ref 1.5–4.5)
Glucose: 99 mg/dL (ref 70–99)
Potassium: 4.2 mmol/L (ref 3.5–5.2)
Sodium: 140 mmol/L (ref 134–144)
Total Protein: 6.7 g/dL (ref 6.0–8.5)
eGFR: 68 mL/min/{1.73_m2} (ref >59)

## 2023-11-14 LAB — LIPID PANEL
Chol/HDL Ratio: 3.6 ratio (ref 0.0–5.0)
Cholesterol, Total: 91 mg/dL — ABNORMAL LOW (ref 100–199)
HDL: 25 mg/dL — ABNORMAL LOW (ref >39)
LDL Chol Calc (NIH): 33 mg/dL (ref 0–99)
Triglycerides: 205 mg/dL — ABNORMAL HIGH (ref 0–149)
VLDL Cholesterol Cal: 33 mg/dL (ref 5–40)

## 2023-11-14 LAB — HEMOGLOBIN A1C
Est. average glucose Bld gHb Est-mCnc: 103 mg/dL
Hgb A1c MFr Bld: 5.2 % (ref 4.8–5.6)

## 2023-11-27 ENCOUNTER — Other Ambulatory Visit: Payer: Self-pay

## 2023-11-27 MED ORDER — DESMOPRESSIN ACETATE 0.2 MG PO TABS: 200.0000 ug | ORAL_TABLET | Freq: Every morning | ORAL | 3 refills | Status: AC

## 2023-11-28 ENCOUNTER — Ambulatory Visit: Admitting: Internal Medicine

## 2024-01-22 ENCOUNTER — Other Ambulatory Visit: Payer: Self-pay | Admitting: Internal Medicine

## 2024-02-12 ENCOUNTER — Other Ambulatory Visit: Payer: Self-pay | Admitting: Internal Medicine

## 2024-02-13 ENCOUNTER — Other Ambulatory Visit: Payer: Self-pay | Admitting: Internal Medicine

## 2024-02-24 ENCOUNTER — Other Ambulatory Visit: Payer: Self-pay | Admitting: Internal Medicine

## 2024-08-11 ENCOUNTER — Ambulatory Visit (INDEPENDENT_AMBULATORY_CARE_PROVIDER_SITE_OTHER): Payer: Self-pay

## 2024-08-11 DIAGNOSIS — L603 Nail dystrophy: Secondary | ICD-10-CM | POA: Diagnosis not present

## 2024-08-12 NOTE — Progress Notes (Signed)
"  °  Subjective:  Patient ID: Jonathan Alexander., male    DOB: 1986/01/10,  MRN: [id number]  39 y.o. male presents painful mycotic toenails of both feet that are difficult to trim. Pain interferes with daily activities and wearing enclosed shoe gear comfortably. 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   Chief Complaint  Patient presents with   Nail Problem    RFC. Thinks he has fungus nails      PCP is Albina GORMAN Dine, MD   Allergies[1]  Review of Systems: Negative except as noted in the HPI.   Objective:  Jonathan Alexander. is a pleasant 39 y.o. male in NAD. AAO x 3.  Vascular Examination: Vascular status intact b/l with palpable pedal pulses. CFT immediate b/l. Pedal hair present. No edema. No pain with calf compression b/l. Skin temperature gradient WNL b/l. No varicosities noted. No cyanosis or clubbing noted.  Neurological Examination: Sensation grossly intact b/l with 10 gram monofilament. Negative tinel sign at tarsal tunnel bilaterally.   Dermatological Examination: Pedal skin with normal turgor, texture and tone b/l. No open wounds nor interdigital macerations noted. Toenails 1-5 b/l elongated with subungual debris and pain on dorsal palpation. Mild thickening and crumbly texture. No hyperkeratotic lesions noted b/l.   Musculoskeletal Examination: Muscle strength 5/5 to b/l LE.  No pain, crepitus noted b/l. No gross pedal deformities. Patient ambulates independently without assistive aids.   Radiographs: None Last A1c:      Latest Ref Rng & Units 11/13/2023    9:40 AM  Hemoglobin A1C  Hemoglobin-A1c 4.8 - 5.6 % 5.2      Assessment:   1. Nail dystrophy     Plan:  Patient was evaluated and treated. All patient's and/or POA's questions/concerns addressed on today's visit. Toenails 1-5 b/l debrided in length and girth without incident. Continue soft, supportive shoe gear daily. Report any pedal injuries to medical professional. Call office if  there are any questions/concerns. -Patient/POA to call should there be question/concern in the interim.  RTC PRN  Prentice Ovens, DPM AACFAS [facility] Tra[facility] Podiatric Surgeon [facility]       [place] LOCATION: [address], [place] [address]                   Office [phone]   [facility] LOCATION: [address] [place], [place] [address] Office [phone]     [1]  Allergies Allergen Reactions   Divalproex Sodium    Shellfish Protein-Containing Drug Products    "

## 2024-11-12 ENCOUNTER — Ambulatory Visit: Admitting: Podiatry
